# Patient Record
Sex: Male | Born: 1935 | Hispanic: No | Marital: Married | Smoking: Former smoker
Health system: Southern US, Community
[De-identification: ages and names within clinical notes are randomized; demographics above are authoritative.]

## PROBLEM LIST (undated history)

## (undated) DIAGNOSIS — N186 End stage renal disease: Secondary | ICD-10-CM

## (undated) DIAGNOSIS — N289 Disorder of kidney and ureter, unspecified: Secondary | ICD-10-CM

## (undated) DIAGNOSIS — Z992 Dependence on renal dialysis: Secondary | ICD-10-CM

## (undated) DIAGNOSIS — C801 Malignant (primary) neoplasm, unspecified: Secondary | ICD-10-CM

## (undated) DIAGNOSIS — I10 Essential (primary) hypertension: Secondary | ICD-10-CM

## (undated) HISTORY — PX: CHOLECYSTECTOMY: SHX55

## (undated) HISTORY — PX: EYE SURGERY: SHX253

---

## 2009-06-12 ENCOUNTER — Encounter: Admission: RE | Admit: 2009-06-12 | Discharge: 2009-06-12 | Payer: Self-pay | Admitting: Family Medicine

## 2009-06-12 ENCOUNTER — Encounter: Payer: Self-pay | Admitting: Gastroenterology

## 2009-06-13 ENCOUNTER — Encounter: Admission: RE | Admit: 2009-06-13 | Discharge: 2009-06-13 | Payer: Self-pay | Admitting: Family Medicine

## 2009-06-18 DIAGNOSIS — E781 Pure hyperglyceridemia: Secondary | ICD-10-CM | POA: Insufficient documentation

## 2009-06-18 DIAGNOSIS — I10 Essential (primary) hypertension: Secondary | ICD-10-CM | POA: Insufficient documentation

## 2009-06-19 ENCOUNTER — Ambulatory Visit: Payer: Self-pay | Admitting: Gastroenterology

## 2009-06-19 DIAGNOSIS — R634 Abnormal weight loss: Secondary | ICD-10-CM | POA: Insufficient documentation

## 2009-06-19 DIAGNOSIS — R1084 Generalized abdominal pain: Secondary | ICD-10-CM | POA: Insufficient documentation

## 2009-06-19 DIAGNOSIS — N179 Acute kidney failure, unspecified: Secondary | ICD-10-CM | POA: Insufficient documentation

## 2009-06-20 LAB — CONVERTED CEMR LAB
Albumin: 4 g/dL (ref 3.5–5.2)
Basophils Absolute: 0 10*3/uL (ref 0.0–0.1)
Bilirubin, Direct: 0.2 mg/dL (ref 0.0–0.3)
Chloride: 109 meq/L (ref 96–112)
Eosinophils Absolute: 0.1 10*3/uL (ref 0.0–0.7)
Eosinophils Relative: 2.1 % (ref 0.0–5.0)
Glucose, Bld: 92 mg/dL (ref 70–99)
HCT: 39.4 % (ref 39.0–52.0)
Iron: 95 ug/dL (ref 42–165)
Lipase: 63 units/L — ABNORMAL HIGH (ref 11.0–59.0)
Lymphs Abs: 2.3 10*3/uL (ref 0.7–4.0)
Monocytes Absolute: 0.4 10*3/uL (ref 0.1–1.0)
Neutro Abs: 3.5 10*3/uL (ref 1.4–7.7)
Platelets: 196 10*3/uL (ref 150.0–400.0)
RDW: 12.9 % (ref 11.5–14.6)
Saturation Ratios: 24.2 % (ref 20.0–50.0)
Sodium: 144 meq/L (ref 135–145)
Total Bilirubin: 1 mg/dL (ref 0.3–1.2)
Transferrin: 280.9 mg/dL (ref 212.0–360.0)
WBC: 6.3 10*3/uL (ref 4.5–10.5)

## 2009-06-21 ENCOUNTER — Ambulatory Visit (HOSPITAL_COMMUNITY): Admission: RE | Admit: 2009-06-21 | Discharge: 2009-06-21 | Payer: Self-pay | Admitting: Gastroenterology

## 2009-06-21 ENCOUNTER — Telehealth: Payer: Self-pay | Admitting: Gastroenterology

## 2009-06-21 DIAGNOSIS — K8070 Calculus of gallbladder and bile duct without cholecystitis without obstruction: Secondary | ICD-10-CM | POA: Insufficient documentation

## 2009-06-27 ENCOUNTER — Ambulatory Visit: Payer: Self-pay | Admitting: Gastroenterology

## 2009-06-27 ENCOUNTER — Encounter: Payer: Self-pay | Admitting: Gastroenterology

## 2009-06-27 DIAGNOSIS — K2970 Gastritis, unspecified, without bleeding: Secondary | ICD-10-CM | POA: Insufficient documentation

## 2009-06-27 DIAGNOSIS — K2990 Gastroduodenitis, unspecified, without bleeding: Secondary | ICD-10-CM

## 2009-06-27 LAB — CONVERTED CEMR LAB: UREASE: NEGATIVE

## 2009-07-02 ENCOUNTER — Encounter: Payer: Self-pay | Admitting: Gastroenterology

## 2009-07-19 ENCOUNTER — Encounter: Payer: Self-pay | Admitting: Gastroenterology

## 2009-07-19 ENCOUNTER — Ambulatory Visit: Payer: Self-pay | Admitting: Internal Medicine

## 2009-07-19 ENCOUNTER — Encounter (INDEPENDENT_AMBULATORY_CARE_PROVIDER_SITE_OTHER): Payer: Self-pay | Admitting: Surgery

## 2009-07-20 ENCOUNTER — Inpatient Hospital Stay (HOSPITAL_COMMUNITY): Admission: AD | Admit: 2009-07-20 | Discharge: 2009-07-22 | Payer: Self-pay | Admitting: Surgery

## 2009-07-20 ENCOUNTER — Encounter: Payer: Self-pay | Admitting: Internal Medicine

## 2010-01-18 ENCOUNTER — Inpatient Hospital Stay (HOSPITAL_COMMUNITY): Admission: EM | Admit: 2010-01-18 | Discharge: 2010-01-26 | Payer: Self-pay | Admitting: Internal Medicine

## 2010-01-18 DIAGNOSIS — D631 Anemia in chronic kidney disease: Secondary | ICD-10-CM | POA: Insufficient documentation

## 2010-01-18 DIAGNOSIS — K750 Abscess of liver: Secondary | ICD-10-CM | POA: Insufficient documentation

## 2010-01-18 DIAGNOSIS — Z87448 Personal history of other diseases of urinary system: Secondary | ICD-10-CM | POA: Insufficient documentation

## 2010-01-18 DIAGNOSIS — N184 Chronic kidney disease, stage 4 (severe): Secondary | ICD-10-CM

## 2010-01-19 ENCOUNTER — Ambulatory Visit: Payer: Self-pay | Admitting: Infectious Diseases

## 2010-01-26 IMAGING — CR DG ABDOMEN 1V
2 series · 2 of 2 positions shown · non-contrast
Comparison: 06/12/2009 and 07/20/2009

CLINICAL DATA: Follow-up ileus.  Abdominal distention.

ABDOMEN - 1 VIEW

[t abdomen supine (1 of 2)]
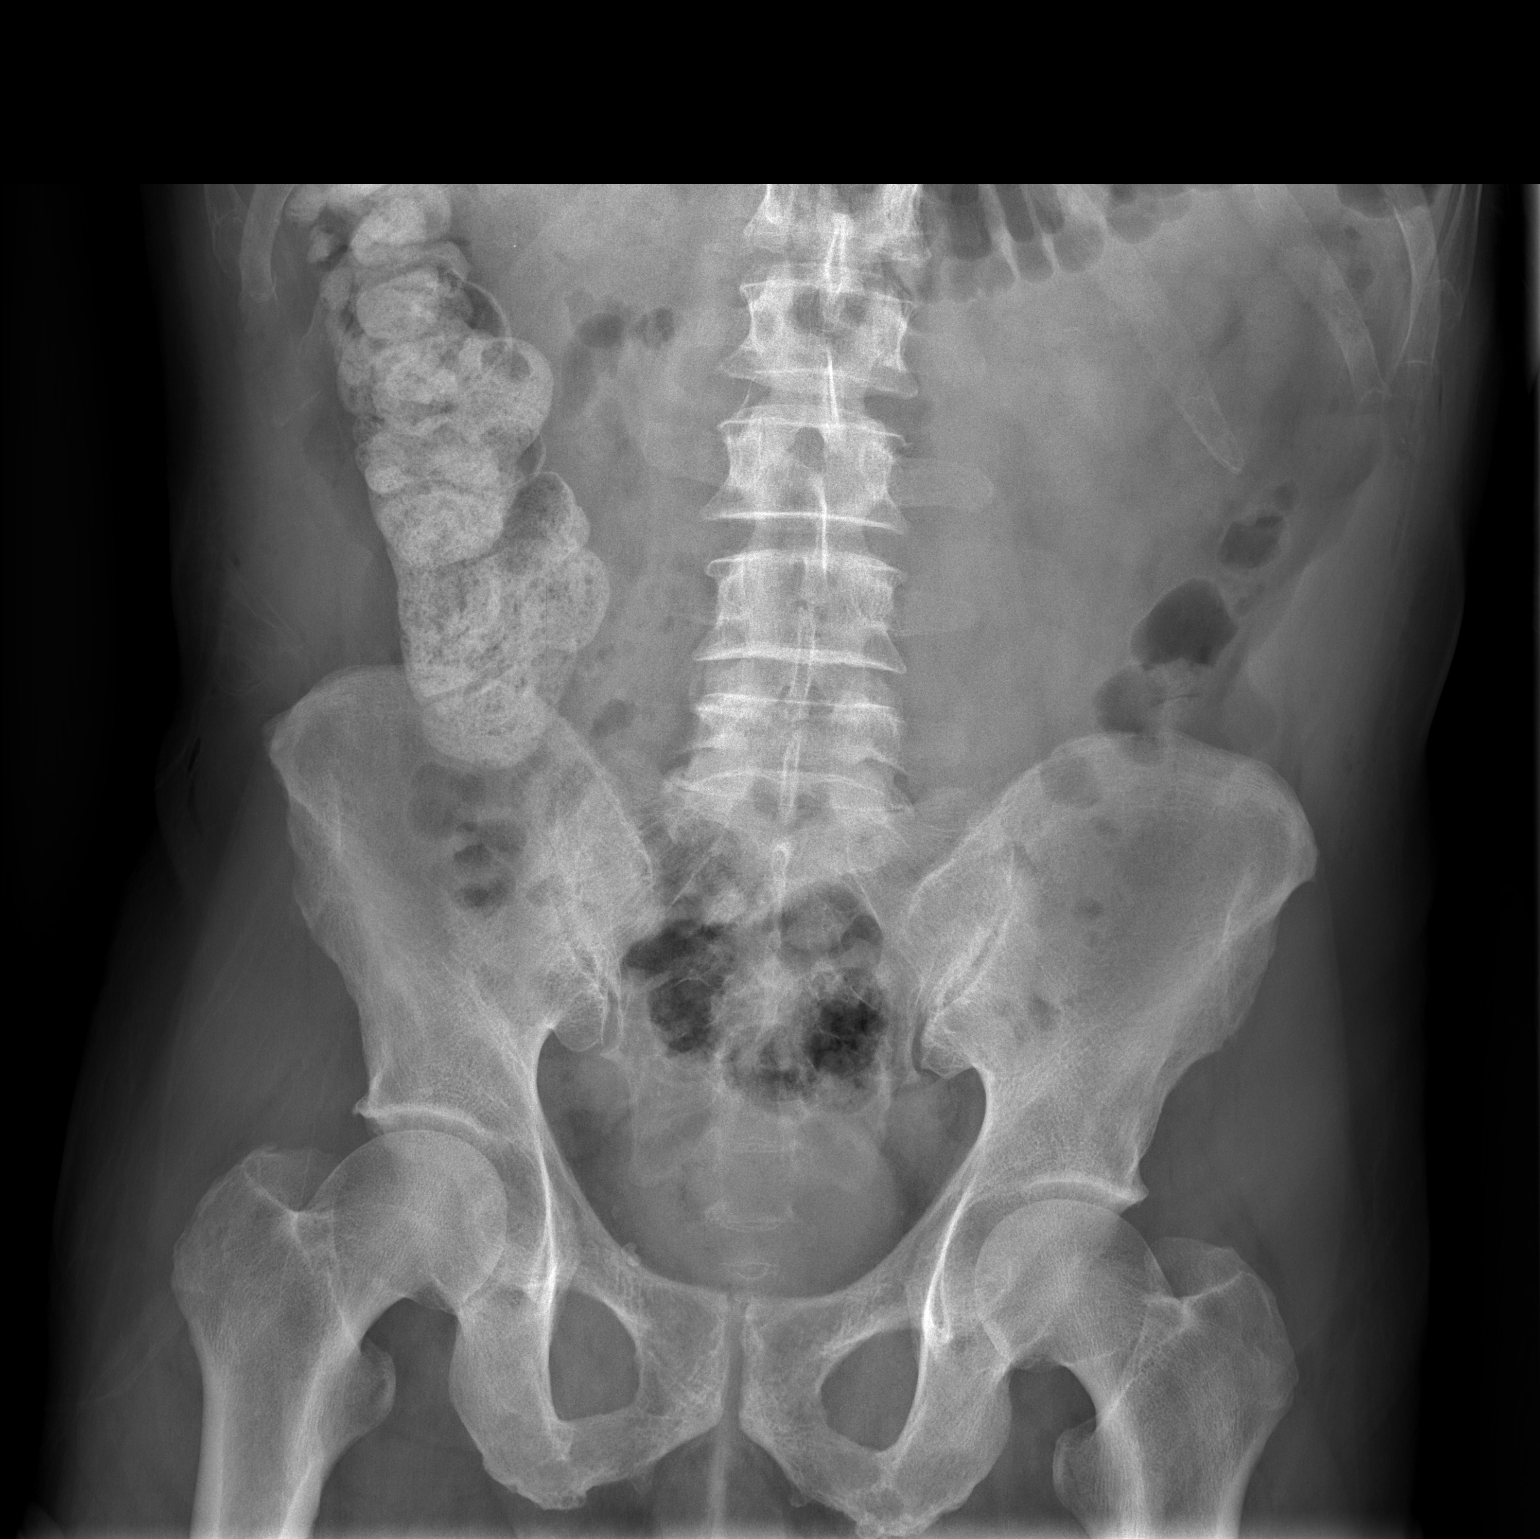

[t abdomen supine (2 of 2)]
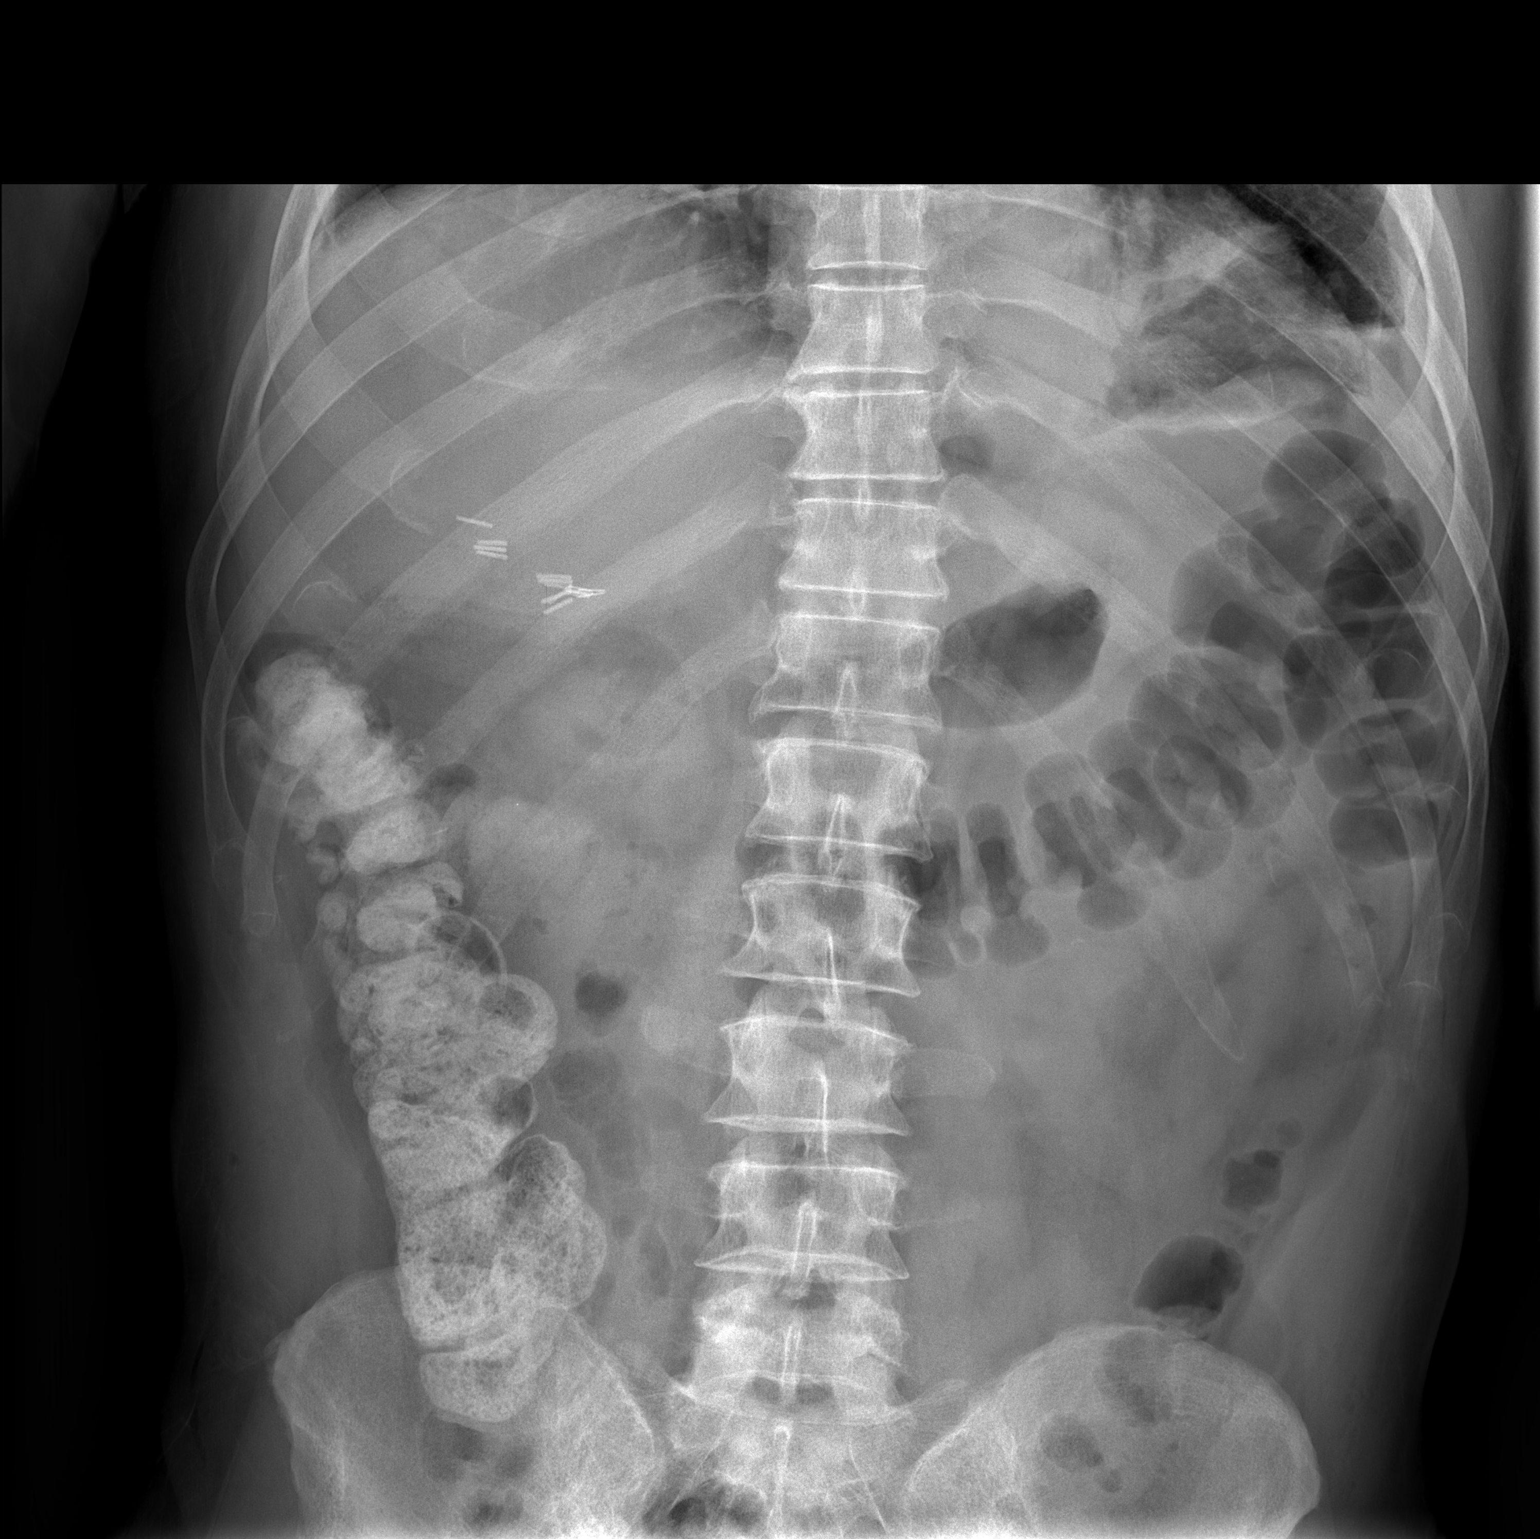

[2 of 2 positions shown; findings below may reference images not displayed]

FINDINGS: Contrast and stool in the right colon are noted.
Gas within the remainder of the colon, which is not distended, is
identified.
No dilated small bowel loops are present.
No acute bony abnormalities are noted.
Bibasilar atelectasis and very small left pleural effusion are
present.
IMPRESSION: Unremarkable bowel gas pattern.

Mild basilar atelectasis and very small left pleural effusion.

## 2010-01-28 ENCOUNTER — Encounter (INDEPENDENT_AMBULATORY_CARE_PROVIDER_SITE_OTHER): Payer: Self-pay | Admitting: *Deleted

## 2010-01-30 ENCOUNTER — Ambulatory Visit (HOSPITAL_COMMUNITY): Admission: RE | Admit: 2010-01-30 | Discharge: 2010-01-30 | Payer: Self-pay | Admitting: Internal Medicine

## 2010-02-05 ENCOUNTER — Encounter: Admission: RE | Admit: 2010-02-05 | Discharge: 2010-02-05 | Payer: Self-pay | Admitting: Interventional Radiology

## 2010-02-06 ENCOUNTER — Ambulatory Visit: Payer: Self-pay | Admitting: Internal Medicine

## 2010-02-08 ENCOUNTER — Encounter: Payer: Self-pay | Admitting: Internal Medicine

## 2010-02-18 ENCOUNTER — Ambulatory Visit: Payer: Self-pay | Admitting: Internal Medicine

## 2010-02-21 ENCOUNTER — Encounter: Admission: RE | Admit: 2010-02-21 | Discharge: 2010-02-21 | Payer: Self-pay | Admitting: Internal Medicine

## 2010-03-05 ENCOUNTER — Ambulatory Visit: Payer: Self-pay | Admitting: Internal Medicine

## 2010-03-05 DIAGNOSIS — L299 Pruritus, unspecified: Secondary | ICD-10-CM | POA: Insufficient documentation

## 2010-08-28 IMAGING — CT CT ABDOMEN W/O CM
3 of 4 series · 12 of 36 positions shown, 18 images · IV contrast (READICAT/WATER)
Comparison: 01/30/2010, 01/18/2010.

CLINICAL DATA: Abdominal pain.  Follow up of liver mass/abscess.
On antibiotics.  Feeling better.  Prior cholecystectomy and surgery
in 7527 for peritonitis.  Elevated creatinine.

CT ABDOMEN WITHOUT CONTRAST
TECHNIQUE: Multidetector CT imaging of the abdomen was performed
following the standard protocol with oral but without intravenous
contrast.

[Series 3: unenhanced · axial · 0.70mm/px · z∈[-193,+22]mm · 7 of 59 slices shown, 12 images]
[im 8/59  soft-tissue]
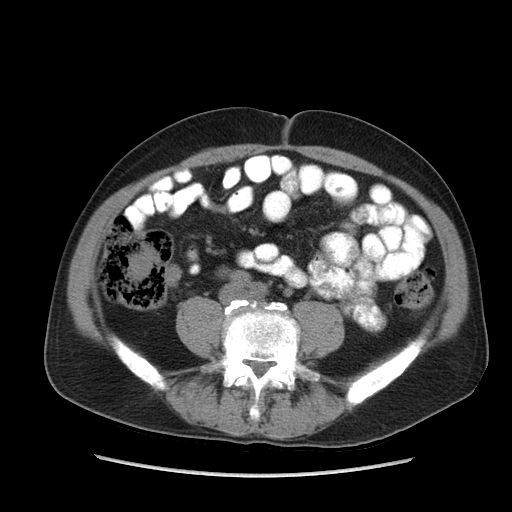
[im 8/59  bone]
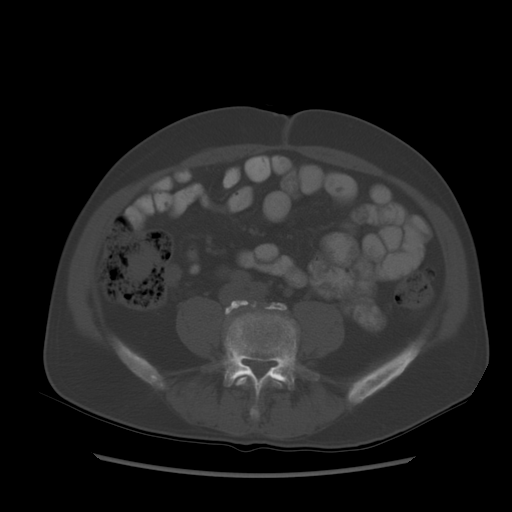
[im 15/59  soft-tissue]
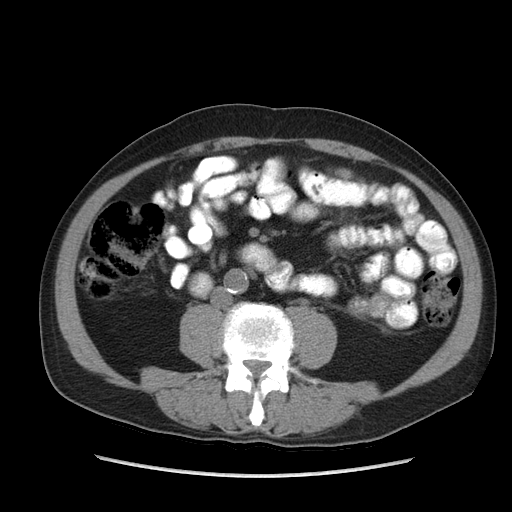
[im 22/59  soft-tissue]
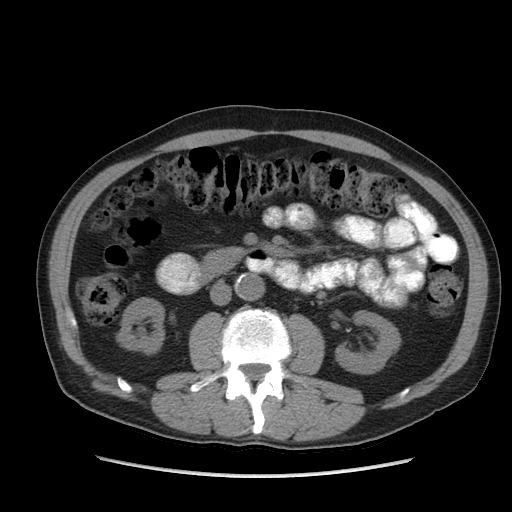
[im 30/59  soft-tissue]
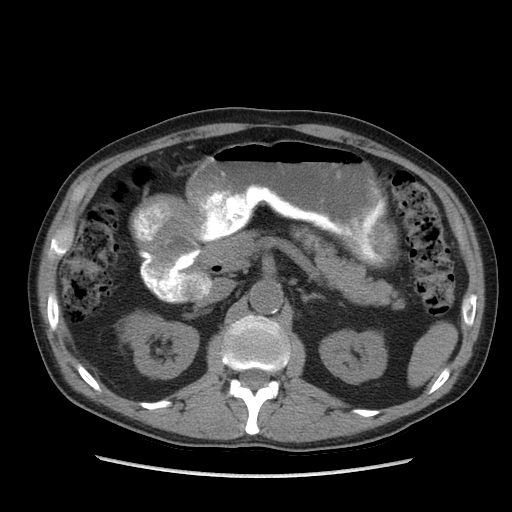
[im 30/59  lung]
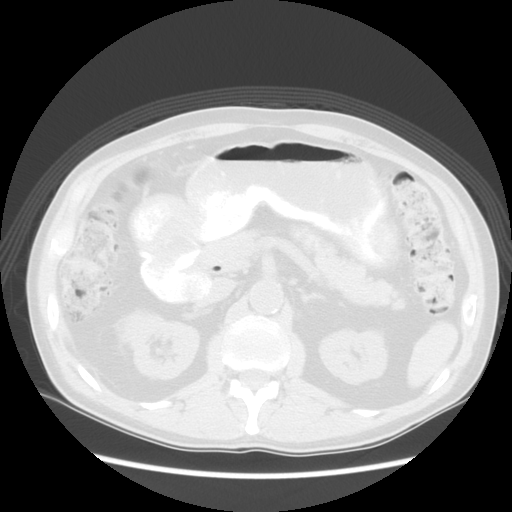
[im 37/59  soft-tissue]
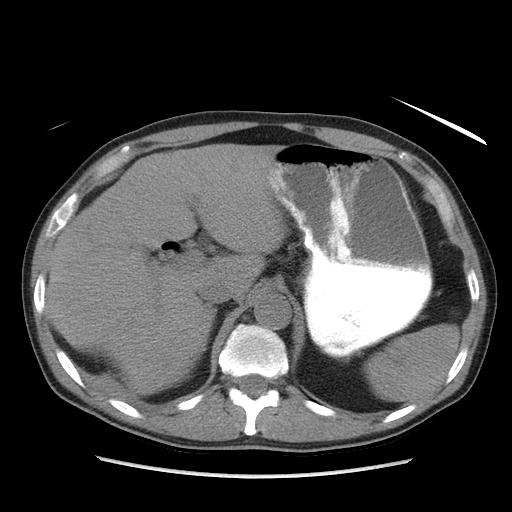
[im 37/59  lung]
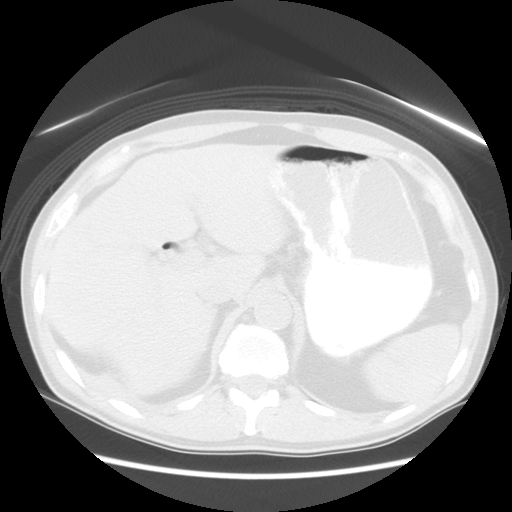
[im 44/59  soft-tissue]
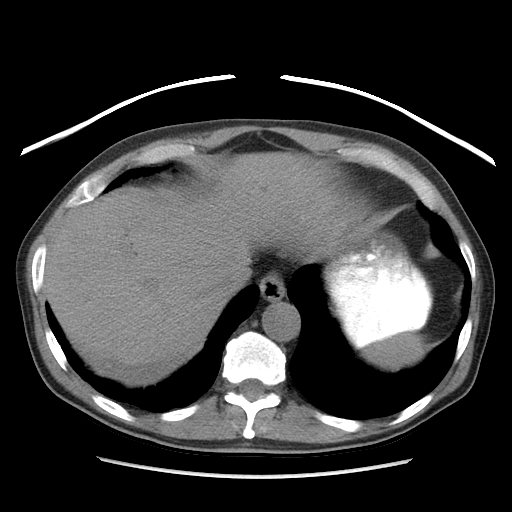
[im 44/59  lung]
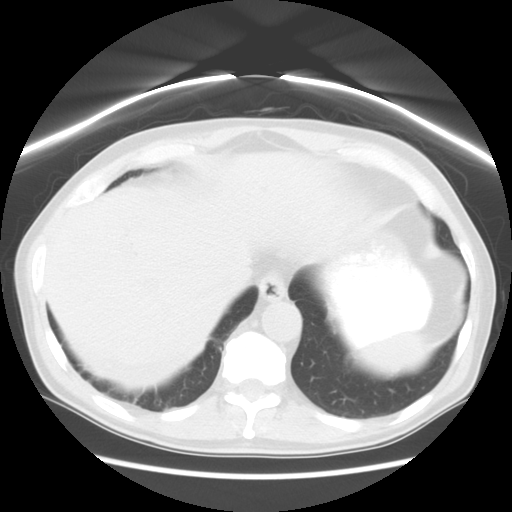
[im 51/59  soft-tissue]
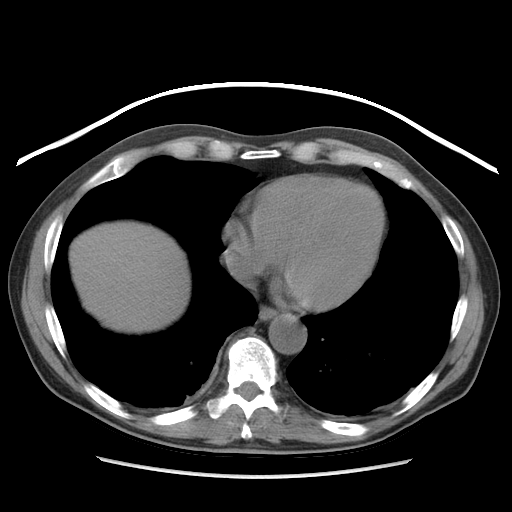
[im 51/59  lung]
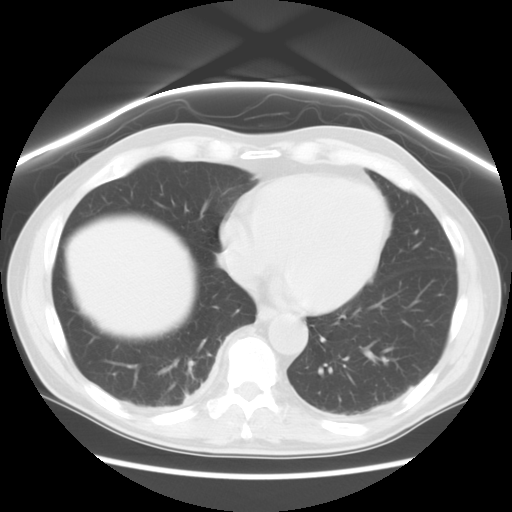

[Series 601: coronal body · coronal · 0.70mm/px · 1 of 102 slices shown, 2 images]
[im 34/102  soft-tissue]
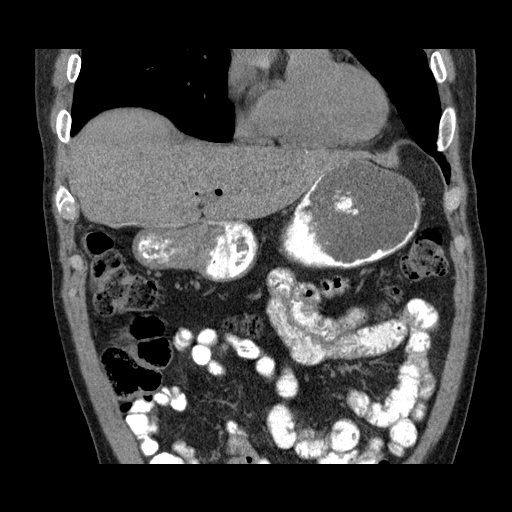
[im 34/102  bone]
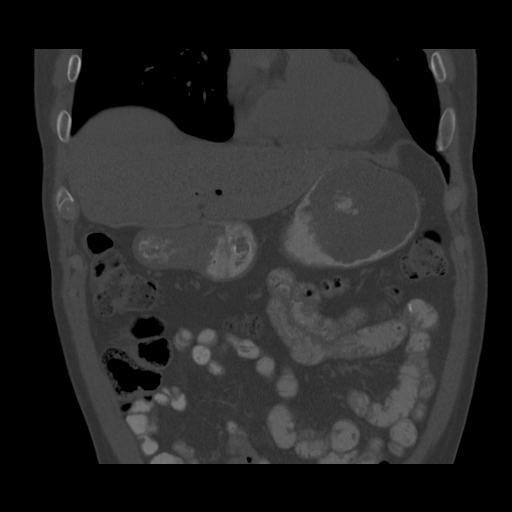

[Series 602: sagittal body · sagittal · 0.70mm/px · 4 of 143 slices shown]
[im 15/143  soft-tissue]
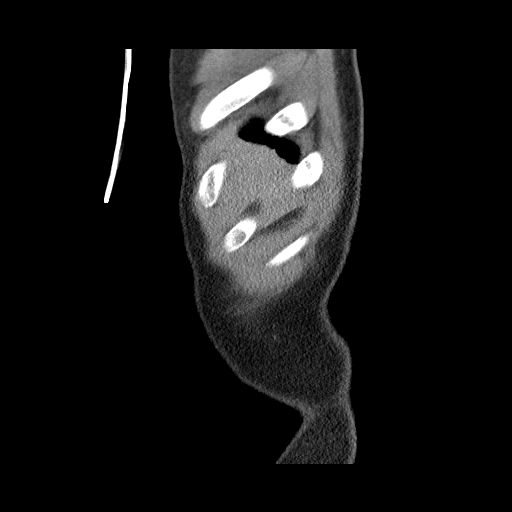
[im 30/143  soft-tissue]
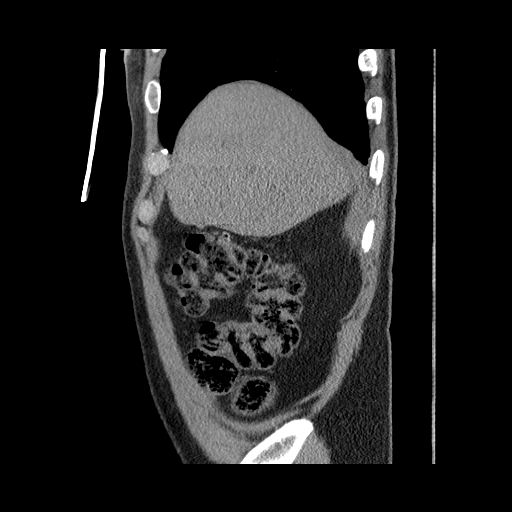
[im 45/143  soft-tissue]
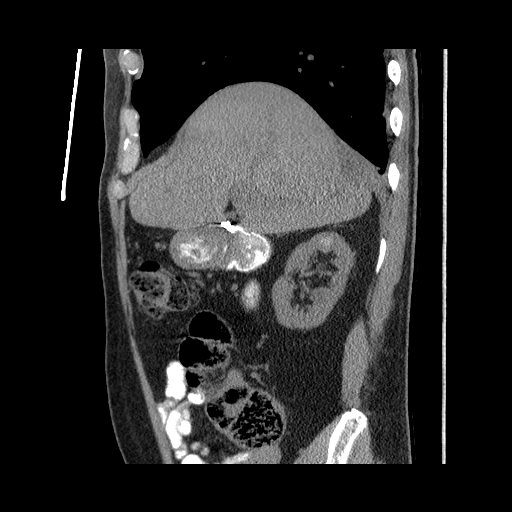
[im 60/143  soft-tissue]
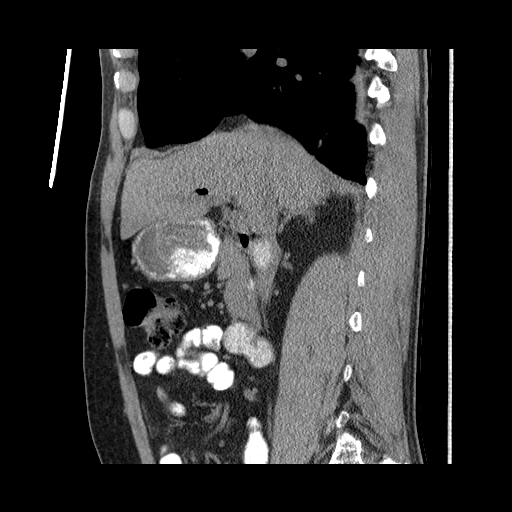

[12 of 36 positions shown; findings below may reference images not displayed]

FINDINGS: Mild bibasilar atelectasis.  Normal heart size.  Mild
bilateral pleural plaque formation.  Left-sided pleural
calcification is better imaged on prior chest CT.

Hypoattenuating hepatic dome lesion measures 6 mm on image 11
versus 7 mm on the prior exam.  Pneumobilia is again identified.
Decrease in size of a posterior right liver lobe hypoattenuating
lesion.  This measures 1.2 cm on image versus 2.8 cm on the prior
exam.  No definite new liver lesions, given limitations of
unenhanced CT.

  Normal spleen, stomach pancreas.  Cholecystectomy.  Normal
adrenal glands.  Mild renal cortical thinning bilaterally.  6 mm
hyperattenuating lesion in the upper pole right kidney on image 28.
Right renal cyst. No retroperitoneal or retrocrural adenopathy.

Normal colon and terminal ileum.  Normal abdominal small bowel
without ascites.

Interval removal of the pigtail catheter within the right
perihepatic space.  Area of surrounding soft tissue thickening has
decreased since the prior exam.  Approximately 1.3 x 3.3 cm on
image 25 versus 4.9 x 2.0 cm on the prior exam.  Soft tissue
density currently, without residual fluid identified .  The
extension towards the liver capsule is also decreased.

No acute osseous abnormality.
IMPRESSION: 1.  Further decrease in size of a a posterior right hepatic lobe
lesion and adjacent perihepatic soft tissue thickening.  No
evidence of residual drainable fluid  nor new infection.
2.  Hepatic dome lesion is indeterminate and measures similarly to
minimally decreased today.
3.  Probable right renal complex cyst.  Technically indeterminate
without contrast.
4. Thoracic findings which are suspicious for prior asbestos
exposure.  Clinically correlate.

## 2010-12-03 NOTE — Letter (Signed)
Summary: Discharge Summary  Discharge Summary   Imported By: Bonner Puna 02/08/2010 13:49:55  _____________________________________________________________________  External Attachment:    Type:   Image     Comment:   External Document

## 2010-12-03 NOTE — Miscellaneous (Signed)
Summary: Problems and Medications updated  Clinical Lists Changes  Problems: Added new problem of ABSCESS, LIVER (ICD-572.0) Added new problem of HEMATURIA, HX OF (ICD-V13.09) Added new problem of ANEMIA (ICD-285.9) Medications: Removed medication of ACIPHEX 20 MG  TBEC (RABEPRAZOLE SODIUM) Take 1 each day 30 minutes before meals Added new medication of MIRTAZAPINE 15 MG TABS (MIRTAZAPINE) Take 1 tablet by mouth at bedtime Added new medication of INVANZ 1 GM SOLR (ERTAPENEM SODIUM) Administer IV via PiCC every 24 hours for the next 4 weeks

## 2010-12-03 NOTE — Assessment & Plan Note (Signed)
Summary: 2wk f/u/vs   Referring Provider:  De Nurse, MD Primary Provider:  De Nurse, MD  CC:  follow-up visit.  History of Present Illness: Mr. Dacko has now completed approximately 4 weeks of IV antibiotics following the drainage of his liver abscess.  His drain was pulled about 3 weeks ago.  He is feeling much better.  He has had no more fevers or abdominal pain.  His appetite has returned to normal and he has regained most of his weight.  He has had no problems tolerating his IV Invanz or PICC.  Preventive Screening-Counseling & Management  Alcohol-Tobacco     Alcohol drinks/day: 0     Smoking Status: quit  Caffeine-Diet-Exercise     Caffeine use/day: yes     Does Patient Exercise: no  Safety-Violence-Falls     Seat Belt Use: yes   Prior Medication List:  LISINOPRIL-HYDROCHLOROTHIAZIDE 20-12.5 MG TABS (LISINOPRIL-HYDROCHLOROTHIAZIDE) take one tab by mouth once daily MIRTAZAPINE 15 MG TABS (MIRTAZAPINE) Take 1 tablet by mouth at bedtime INVANZ 1 GM SOLR (ERTAPENEM SODIUM) Administer IV via PiCC every 24 hours for the next 4 weeks   Current Allergies (reviewed today): ! LOVASTATIN ! LIPITOR (ATORVASTATIN) Vital Signs:  Patient profile:   77 year old male Height:      65 inches (165.10 cm) Weight:      140.75 pounds (63.98 kg) BMI:     23.51 Temp:     97.4 degrees F (36.33 degrees C) oral Pulse rate:   67 / minute BP sitting:   126 / 76  (left arm) Cuff size:   regular  Vitals Entered By: Lorne Skeens RN (February 18, 2010 3:05 PM) CC: follow-up visit Is Patient Diabetic? No Pain Assessment Patient in pain? no      Nutritional Status BMI of 19 -24 = normal Nutritional Status Detail appetit e"very good"  Have you ever been in a relationship where you felt threatened, hurt or afraid?not asked, wife present   Does patient need assistance? Functional Status Self care Ambulation Normal   Physical Exam  General:  alert and well-nourished.   Abdomen:   soft, non-tender, normal bowel sounds, and no distention.     Impression & Recommendations:  Problem # 1:  ABSCESS, LIVER (ICD-572.0) He is much better and there is a good chance that his liver abscess he is have resolved.  However on his last CAT scan he did have an extra hepatic abscess that had not changed in size.  I reviewed options with him and his daughters today and we have decided to change him to oral Augmentin, remove the PICC, and repeat the CAT scan before deciding to stop all antibiotics. Orders: Est. Patient Level III [id number]) CT without Contrast (CT w/o contrast)  Medications Added to Medication List This Visit: 1)  Augmentin 875-125 Mg Tabs (Amoxicillin-pot clavulanate) .... Take 1 tablet by mouth two times a day  Patient Instructions: 1)  Please schedule a follow-up appointment on May 3. Prescriptions: AUGMENTIN 875-125 MG TABS (AMOXICILLIN-POT CLAVULANATE) Take 1 tablet by mouth two times a day  #28 x 1   Entered and Authorized by:   Michel Bickers MD   Signed by:   Michel Bickers MD on 02/18/2010   Method used:   Electronically to        [facility] [phone]* (retail)       [address]       [place], [place]  [address]       Ph: [phone]  or [phone]       Fax: [phone]   [id number]   Appended Document: 2wk f/u/vs

## 2010-12-03 NOTE — Assessment & Plan Note (Signed)
Summary: F/U OV/VS   Referring Provider:  De Nurse, MD Primary Provider:  De Nurse, MD  CC:  follow-up visit and started itching right away after beginnning oral antibiotic.  History of Present Illness: Mr. Macak is in for his routine visit.  He is completing his sixth week of total antibiotic therapy for his liver abscess.  He has been on Augmentin for the last two weeks.  He is feeling much better.  He has not had any fever, abdominal pain, and his appetite has returned to normal.  He is continuing to regain his lost weight.  His only problem since his last visit has been generalized itching that started shortly after he began taking Augmentin.  Preventive Screening-Counseling & Management  Alcohol-Tobacco     Alcohol drinks/day: 0     Smoking Status: quit  Caffeine-Diet-Exercise     Caffeine use/day: yes     Does Patient Exercise: no  Safety-Violence-Falls     Seat Belt Use: yes   Current Allergies (reviewed today): ! LOVASTATIN ! LIPITOR (ATORVASTATIN) Vital Signs:  Patient profile:   77 year old male Height:      65 inches (165.10 cm) Weight:      141.5 pounds (64.32 kg) BMI:     23.63 Temp:     97.7 degrees F (36.50 degrees C) oral Pulse rate:   72 / minute BP sitting:   130 / 77  Vitals Entered By: Lorne Skeens RN (Mar 05, 2010 10:01 AM) CC: follow-up visit, started itching right away after beginnning oral antibiotic Is Patient Diabetic? No Pain Assessment Patient in pain? no      Nutritional Status BMI of 19 -24 = normal Nutritional Status Detail appetite "very good"  Have you ever been in a relationship where you felt threatened, hurt or afraid?not asked   Does patient need assistance? Functional Status Self care Ambulation Normal   Physical Exam  General:  alert and well-nourished.  his weight is up one more pound or Lungs:  normal breath sounds.  no crackles and no wheezes.   Heart:  normal rate, regular rhythm, and no murmur.     Abdomen:  soft, non-tender, normal bowel sounds, and no masses.   Skin:  no rashes.     Impression & Recommendations:  Problem # 1:  ABSCESS, LIVER (ICD-572.0) His CAT scan two weeks ago showed significant improvement in his liver abscess. I suspect he is cured and will stop his antibiotics today, especially since he is probably having generalized itching due to the Augmentin.  I have asked him to call me if he begins to have any more fever, abdominal pain or other signs or symptoms of recurrent infection. Orders: Est. Patient Level III [id number])  Problem # 2:  PRURITUS (ICD-698.9) I suspect he has a mild allergic reaction to Augmentin and it should resolve once he has stopped it. Orders: Est. Patient Level III [id number])  Patient Instructions: 1)  Please schedule a follow-up appointment in 6 weeks.  Appended Document: F/U OV, updated allergies

## 2010-12-03 NOTE — Assessment & Plan Note (Signed)
Summary: HFU   Referring Provider:  De Nurse, MD Primary Provider:  De Nurse, MD  CC:  [facility].  History of Present Illness: Mr. Pedro Young is in for his hospital follow-up visit.  He recently developed a liver abscess and was hospitalized with fever.  On March 21 he underwent percutaneous drainage of the abscess.  E. coli grew from cultures and he improved on empiric antibiotics.  He was discharged home recently on Invanz.  Follow-up CT scan on March 30 showed improvement.  His percutaneous catheter drainage decreased dramatically and the catheter was removed yesterday.  He lost about 10 pounds during this illness but his appetite is improving and he is feeling much better.  He's not had any problems tolerating his PICC or Invanz.  Preventive Screening-Counseling & Management  Alcohol-Tobacco     Alcohol drinks/day: 0     Smoking Status: quit  Caffeine-Diet-Exercise     Caffeine use/day: yes     Does Patient Exercise: no  Safety-Violence-Falls     Seat Belt Use: yes   Current Allergies (reviewed today): ! LOVASTATIN ! LIPITOR (ATORVASTATIN) Vital Signs:  Patient profile:   77 year old male Height:      65 inches (165.10 cm) Weight:      136.5 pounds (62.05 kg) BMI:     22.80 Temp:     97.5 degrees F (36.39 degrees C) oral Pulse rate:   99 / minute BP sitting:   102 / 67  Vitals Entered By: Lorne Skeens RN (February 06, 2010 2:11 PM) CC: [facility] Is Patient Diabetic? No Pain Assessment Patient in pain? no      Nutritional Status BMI of 19 -24 = normal Nutritional Status Detail appetite :very good"  Have you ever been in a relationship where you felt threatened, hurt or afraid?not asked   Does patient need assistance? Functional Status Self care Ambulation Normal   Physical Exam  General:  alert and well-nourished.   Lungs:  normal breath sounds.   Heart:  normal rate, regular rhythm, and no murmur.   Abdomen:  soft, non-tender, and normal bowel sounds.   there is a clean dry dressing on his right flank where the drain was removed. Extremities:  his right arm PICC site appears normal.   Impression & Recommendations:  Problem # 1:  ABSCESS, LIVER (ICD-572.0) He is improving.  I will see him back on April 18 and consider switching to oral Augmentin and getting a follow-up CT scan at that time. Orders: Est. Patient Level III [id number])  Patient Instructions: 1)  Please schedule a follow-up appointment on 4/18.

## 2011-01-27 LAB — CULTURE, BLOOD (ROUTINE X 2)

## 2011-01-27 LAB — HIV-1 RNA QUANT-NO REFLEX-BLD
HIV 1 RNA Quant: <48 copies/mL (ref <48)
HIV-1 RNA Quant, Log: <1.68 {Log} (ref <1.68)

## 2011-01-27 LAB — GLUCOSE, CAPILLARY
Glucose-Capillary: 102 mg/dL — ABNORMAL HIGH (ref 70–99)
Glucose-Capillary: 106 mg/dL — ABNORMAL HIGH (ref 70–99)
Glucose-Capillary: 118 mg/dL — ABNORMAL HIGH (ref 70–99)
Glucose-Capillary: 118 mg/dL — ABNORMAL HIGH (ref 70–99)
Glucose-Capillary: 124 mg/dL — ABNORMAL HIGH (ref 70–99)
Glucose-Capillary: 195 mg/dL — ABNORMAL HIGH (ref 70–99)
Glucose-Capillary: 89 mg/dL (ref 70–99)
Glucose-Capillary: 98 mg/dL (ref 70–99)

## 2011-01-27 LAB — HEPATITIS E ANTIBODY, IGG

## 2011-01-27 LAB — URINE CULTURE: Colony Count: 60000

## 2011-01-27 LAB — COMPREHENSIVE METABOLIC PANEL
ALT: 24 U/L (ref 0–53)
ALT: 26 U/L (ref 0–53)
AST: 19 U/L (ref 0–37)
AST: 23 U/L (ref 0–37)
Albumin: 2.3 g/dL — ABNORMAL LOW (ref 3.5–5.2)
Albumin: 2.4 g/dL — ABNORMAL LOW (ref 3.5–5.2)
Albumin: 2.5 g/dL — ABNORMAL LOW (ref 3.5–5.2)
BUN: 59 mg/dL — ABNORMAL HIGH (ref 6–23)
CO2: 24 mEq/L (ref 19–32)
Chloride: 100 mEq/L (ref 96–112)
Chloride: 102 mEq/L (ref 96–112)
Chloride: 104 mEq/L (ref 96–112)
Creatinine, Ser: 2.7 mg/dL — ABNORMAL HIGH (ref 0.4–1.5)
Creatinine, Ser: 2.74 mg/dL — ABNORMAL HIGH (ref 0.4–1.5)
GFR calc Af Amer: 25 mL/min — ABNORMAL LOW (ref >60)
GFR calc Af Amer: 28 mL/min — ABNORMAL LOW (ref >60)
GFR calc non Af Amer: 20 mL/min — ABNORMAL LOW (ref >60)
GFR calc non Af Amer: 23 mL/min — ABNORMAL LOW (ref >60)
Sodium: 136 mEq/L (ref 135–145)
Sodium: 138 mEq/L (ref 135–145)
Total Bilirubin: 0.5 mg/dL (ref 0.3–1.2)
Total Bilirubin: 0.8 mg/dL (ref 0.3–1.2)
Total Bilirubin: 0.9 mg/dL (ref 0.3–1.2)

## 2011-01-27 LAB — CBC
HCT: 26.7 % — ABNORMAL LOW (ref 39.0–52.0)
HCT: 27.2 % — ABNORMAL LOW (ref 39.0–52.0)
HCT: 27.6 % — ABNORMAL LOW (ref 39.0–52.0)
HCT: 28.6 % — ABNORMAL LOW (ref 39.0–52.0)
Hemoglobin: 9 g/dL — ABNORMAL LOW (ref 13.0–17.0)
Hemoglobin: 9.2 g/dL — ABNORMAL LOW (ref 13.0–17.0)
Hemoglobin: 9.2 g/dL — ABNORMAL LOW (ref 13.0–17.0)
MCHC: 33.8 g/dL (ref 30.0–36.0)
MCHC: 33.9 g/dL (ref 30.0–36.0)
MCHC: 34.1 g/dL (ref 30.0–36.0)
MCV: 87.7 fL (ref 78.0–100.0)
MCV: 87.8 fL (ref 78.0–100.0)
MCV: 88.2 fL (ref 78.0–100.0)
Platelets: 234 10*3/uL (ref 150–400)
Platelets: 238 10*3/uL (ref 150–400)
Platelets: 263 10*3/uL (ref 150–400)
Platelets: 318 10*3/uL (ref 150–400)
RBC: 3.04 MIL/uL — ABNORMAL LOW (ref 4.22–5.81)
RBC: 3.08 MIL/uL — ABNORMAL LOW (ref 4.22–5.81)
RBC: 3.23 MIL/uL — ABNORMAL LOW (ref 4.22–5.81)
RDW: 15.3 % (ref 11.5–15.5)
RDW: 15.4 % (ref 11.5–15.5)
RDW: 15.4 % (ref 11.5–15.5)
WBC: 13.5 10*3/uL — ABNORMAL HIGH (ref 4.0–10.5)
WBC: 15.8 10*3/uL — ABNORMAL HIGH (ref 4.0–10.5)
WBC: 17.6 10*3/uL — ABNORMAL HIGH (ref 4.0–10.5)
WBC: 25.6 10*3/uL — ABNORMAL HIGH (ref 4.0–10.5)

## 2011-01-27 LAB — E. HISTOLYTICA ANTIBODY (AMOEBA AB)

## 2011-01-27 LAB — BASIC METABOLIC PANEL
BUN: 42 mg/dL — ABNORMAL HIGH (ref 6–23)
CO2: 23 mEq/L (ref 19–32)
CO2: 25 mEq/L (ref 19–32)
Calcium: 8.2 mg/dL — ABNORMAL LOW (ref 8.4–10.5)
Calcium: 8.6 mg/dL (ref 8.4–10.5)
Chloride: 106 mEq/L (ref 96–112)
Chloride: 106 mEq/L (ref 96–112)
Creatinine, Ser: 3.27 mg/dL — ABNORMAL HIGH (ref 0.4–1.5)
GFR calc Af Amer: 23 mL/min — ABNORMAL LOW (ref >60)
GFR calc Af Amer: 25 mL/min — ABNORMAL LOW (ref >60)
GFR calc non Af Amer: 22 mL/min — ABNORMAL LOW (ref >60)
Glucose, Bld: 106 mg/dL — ABNORMAL HIGH (ref 70–99)
Glucose, Bld: 162 mg/dL — ABNORMAL HIGH (ref 70–99)
Potassium: 4 mEq/L (ref 3.5–5.1)
Potassium: 4.4 mEq/L (ref 3.5–5.1)
Potassium: 4.5 mEq/L (ref 3.5–5.1)
Sodium: 140 mEq/L (ref 135–145)
Sodium: 141 mEq/L (ref 135–145)
Sodium: 142 mEq/L (ref 135–145)

## 2011-01-27 LAB — EPSTEIN-BARR VIRUS EARLY D ANTIGEN ANTIBODY, IGG: EBV EA IgG: 1.71 {ISR} — ABNORMAL HIGH

## 2011-01-27 LAB — DIC (DISSEMINATED INTRAVASCULAR COAGULATION)PANEL
D-Dimer, Quant: 1.09 ug/mL-FEU — ABNORMAL HIGH (ref 0.00–0.48)
INR: 1.15 (ref 0.00–1.49)
Prothrombin Time: 14.6 seconds (ref 11.6–15.2)
Smear Review: NONE SEEN
aPTT: 36 seconds (ref 24–37)

## 2011-01-27 LAB — DIFFERENTIAL
Basophils Absolute: 0 10*3/uL (ref 0.0–0.1)
Basophils Relative: 0 % (ref 0–1)
Eosinophils Relative: 0 % (ref 0–5)
Lymphocytes Relative: 16 % (ref 12–46)
Lymphocytes Relative: 18 % (ref 12–46)
Monocytes Absolute: 1 10*3/uL (ref 0.1–1.0)
Monocytes Absolute: 1.4 10*3/uL — ABNORMAL HIGH (ref 0.1–1.0)
Monocytes Relative: 8 % (ref 3–12)
Neutro Abs: 10.2 10*3/uL — ABNORMAL HIGH (ref 1.7–7.7)
Neutro Abs: 9.6 10*3/uL — ABNORMAL HIGH (ref 1.7–7.7)
Neutrophils Relative %: 71 % (ref 43–77)

## 2011-01-27 LAB — PROTEIN ELECTROPHORESIS, SERUM
Alpha-1-Globulin: 8.9 % — ABNORMAL HIGH (ref 2.9–4.9)
Alpha-2-Globulin: 17.1 % — ABNORMAL HIGH (ref 7.1–11.8)
Beta 2: 6.3 % (ref 3.2–6.5)
Beta Globulin: 6.8 % (ref 4.7–7.2)
Gamma Globulin: 19.1 % — ABNORMAL HIGH (ref 11.1–18.8)

## 2011-01-27 LAB — AFP TUMOR MARKER: AFP-Tumor Marker: 1.4 ng/mL (ref 0.0–8.0)

## 2011-01-27 LAB — URINALYSIS, ROUTINE W REFLEX MICROSCOPIC
Leukocytes, UA: NEGATIVE
Protein, ur: NEGATIVE mg/dL
Urobilinogen, UA: 1 mg/dL (ref 0.0–1.0)

## 2011-01-27 LAB — CULTURE, ROUTINE-ABSCESS

## 2011-01-27 LAB — MISCELLANEOUS TEST

## 2011-01-27 LAB — PROTIME-INR
INR: 1.17 (ref 0.00–1.49)
INR: 1.31 (ref 0.00–1.49)
Prothrombin Time: 14.8 seconds (ref 11.6–15.2)

## 2011-01-27 LAB — HEMOGLOBIN A1C: Mean Plasma Glucose: 123 mg/dL

## 2011-01-27 LAB — ANA: Anti Nuclear Antibody(ANA): NEGATIVE

## 2011-01-27 LAB — CK: Total CK: 27 U/L (ref 7–232)

## 2011-01-27 LAB — FUNGUS CULTURE W SMEAR

## 2011-01-27 LAB — URINE MICROSCOPIC-ADD ON

## 2011-01-27 LAB — ANAEROBIC CULTURE

## 2011-01-27 LAB — HCV RNA QUANT

## 2011-01-27 LAB — CRYOGLOBULIN

## 2011-02-07 LAB — DIFFERENTIAL
Eosinophils Absolute: 0.1 10*3/uL (ref 0.0–0.7)
Lymphocytes Relative: 34 % (ref 12–46)
Lymphs Abs: 2.2 10*3/uL (ref 0.7–4.0)
Monocytes Relative: 8 % (ref 3–12)
Neutrophils Relative %: 56 % (ref 43–77)

## 2011-02-07 LAB — HEPATIC FUNCTION PANEL
AST: 23 U/L (ref 0–37)
AST: 34 U/L (ref 0–37)
Albumin: 2.7 g/dL — ABNORMAL LOW (ref 3.5–5.2)
Alkaline Phosphatase: 83 U/L (ref 39–117)
Bilirubin, Direct: 0.2 mg/dL (ref 0.0–0.3)
Indirect Bilirubin: 1.2 mg/dL — ABNORMAL HIGH (ref 0.3–0.9)
Total Bilirubin: 1 mg/dL (ref 0.3–1.2)

## 2011-02-07 LAB — COMPREHENSIVE METABOLIC PANEL
ALT: 44 U/L (ref 0–53)
ALT: 12 U/L (ref 0–53)
BUN: 48 mg/dL — ABNORMAL HIGH (ref 6–23)
CO2: 23 mEq/L (ref 19–32)
Calcium: 8.1 mg/dL — ABNORMAL LOW (ref 8.4–10.5)
Calcium: 9 mg/dL (ref 8.4–10.5)
Creatinine, Ser: 2.29 mg/dL — ABNORMAL HIGH (ref 0.4–1.5)
GFR calc Af Amer: 34 mL/min — ABNORMAL LOW (ref >60)
GFR calc non Af Amer: 28 mL/min — ABNORMAL LOW (ref >60)
Glucose, Bld: 128 mg/dL — ABNORMAL HIGH (ref 70–99)
Glucose, Bld: 95 mg/dL (ref 70–99)
Sodium: 137 mEq/L (ref 135–145)
Sodium: 137 mEq/L (ref 135–145)
Total Protein: 7.1 g/dL (ref 6.0–8.3)

## 2011-02-07 LAB — AMYLASE: Amylase: 85 U/L (ref 27–131)

## 2011-02-07 LAB — ABO/RH: ABO/RH(D): A POS

## 2011-02-07 LAB — CBC
HCT: 36.9 % — ABNORMAL LOW (ref 39.0–52.0)
HCT: 39 % (ref 39.0–52.0)
Hemoglobin: 12.1 g/dL — ABNORMAL LOW (ref 13.0–17.0)
Hemoglobin: 13.2 g/dL (ref 13.0–17.0)
Hemoglobin: 12.9 g/dL — ABNORMAL LOW (ref 13.0–17.0)
MCHC: 33.8 g/dL (ref 30.0–36.0)
MCHC: 33.5 g/dL (ref 30.0–36.0)
MCV: 94.2 fL (ref 78.0–100.0)
MCV: 94.9 fL (ref 78.0–100.0)
MCV: 94.5 fL (ref 78.0–100.0)
Platelets: 124 10*3/uL — ABNORMAL LOW (ref 150–400)
RBC: 3.76 MIL/uL — ABNORMAL LOW (ref 4.22–5.81)
RBC: 4.11 MIL/uL — ABNORMAL LOW (ref 4.22–5.81)
RDW: 13.9 % (ref 11.5–15.5)
RDW: 14 % (ref 11.5–15.5)

## 2011-02-07 LAB — PROTIME-INR: INR: 1 (ref 0.00–1.49)

## 2011-02-07 LAB — TYPE AND SCREEN: ABO/RH(D): A POS

## 2011-07-18 ENCOUNTER — Ambulatory Visit
Admission: RE | Admit: 2011-07-18 | Discharge: 2011-07-18 | Disposition: A | Discharge status: Home / Self Care | Payer: Medicare PPO | Source: Ambulatory Visit | Attending: Specialist | Admitting: Specialist

## 2011-07-18 ENCOUNTER — Other Ambulatory Visit: Payer: Self-pay | Admitting: Specialist

## 2011-07-18 DIAGNOSIS — N342 Other urethritis: Secondary | ICD-10-CM

## 2011-07-18 DIAGNOSIS — H209 Unspecified iridocyclitis: Secondary | ICD-10-CM

## 2013-05-16 DIAGNOSIS — E785 Hyperlipidemia, unspecified: Secondary | ICD-10-CM | POA: Diagnosis present

## 2015-08-02 ENCOUNTER — Emergency Department (HOSPITAL_COMMUNITY): Payer: Medicare HMO

## 2015-08-02 ENCOUNTER — Inpatient Hospital Stay (HOSPITAL_COMMUNITY)
Admission: EM | Admit: 2015-08-02 | Discharge: 2015-08-09 | DRG: 438 | Disposition: A | Discharge status: Home / Self Care | Payer: Medicare HMO | Attending: Internal Medicine | Admitting: Internal Medicine

## 2015-08-02 ENCOUNTER — Encounter (HOSPITAL_COMMUNITY): Payer: Self-pay | Admitting: Emergency Medicine

## 2015-08-02 DIAGNOSIS — M10071 Idiopathic gout, right ankle and foot: Secondary | ICD-10-CM | POA: Diagnosis not present

## 2015-08-02 DIAGNOSIS — K859 Acute pancreatitis without necrosis or infection, unspecified: Secondary | ICD-10-CM | POA: Diagnosis present

## 2015-08-02 DIAGNOSIS — R319 Hematuria, unspecified: Secondary | ICD-10-CM | POA: Diagnosis present

## 2015-08-02 DIAGNOSIS — K8500 Idiopathic acute pancreatitis without necrosis or infection: Secondary | ICD-10-CM | POA: Diagnosis not present

## 2015-08-02 DIAGNOSIS — N184 Chronic kidney disease, stage 4 (severe): Secondary | ICD-10-CM | POA: Diagnosis present

## 2015-08-02 DIAGNOSIS — M109 Gout, unspecified: Secondary | ICD-10-CM

## 2015-08-02 DIAGNOSIS — K5900 Constipation, unspecified: Secondary | ICD-10-CM | POA: Diagnosis not present

## 2015-08-02 DIAGNOSIS — Z66 Do not resuscitate: Secondary | ICD-10-CM | POA: Diagnosis present

## 2015-08-02 DIAGNOSIS — N179 Acute kidney failure, unspecified: Secondary | ICD-10-CM | POA: Diagnosis not present

## 2015-08-02 DIAGNOSIS — R1030 Lower abdominal pain, unspecified: Secondary | ICD-10-CM | POA: Diagnosis present

## 2015-08-02 DIAGNOSIS — G4700 Insomnia, unspecified: Secondary | ICD-10-CM | POA: Diagnosis present

## 2015-08-02 DIAGNOSIS — I10 Essential (primary) hypertension: Secondary | ICD-10-CM | POA: Diagnosis not present

## 2015-08-02 DIAGNOSIS — I129 Hypertensive chronic kidney disease with stage 1 through stage 4 chronic kidney disease, or unspecified chronic kidney disease: Secondary | ICD-10-CM | POA: Diagnosis present

## 2015-08-02 DIAGNOSIS — D631 Anemia in chronic kidney disease: Secondary | ICD-10-CM | POA: Diagnosis present

## 2015-08-02 DIAGNOSIS — N170 Acute kidney failure with tubular necrosis: Secondary | ICD-10-CM | POA: Diagnosis present

## 2015-08-02 DIAGNOSIS — K750 Abscess of liver: Secondary | ICD-10-CM | POA: Diagnosis not present

## 2015-08-02 DIAGNOSIS — K862 Cyst of pancreas: Secondary | ICD-10-CM

## 2015-08-02 DIAGNOSIS — E781 Pure hyperglyceridemia: Secondary | ICD-10-CM | POA: Diagnosis present

## 2015-08-02 DIAGNOSIS — R509 Fever, unspecified: Secondary | ICD-10-CM

## 2015-08-02 HISTORY — DX: Disorder of kidney and ureter, unspecified: N28.9

## 2015-08-02 LAB — CBC
HCT: 34.9 % — ABNORMAL LOW (ref 39.0–52.0)
HEMATOCRIT: 38.5 % — AB (ref 39.0–52.0)
HEMOGLOBIN: 12.7 g/dL — AB (ref 13.0–17.0)
Hemoglobin: 11.4 g/dL — ABNORMAL LOW (ref 13.0–17.0)
MCH: 31.5 pg (ref 26.0–34.0)
MCH: 30.8 pg (ref 26.0–34.0)
MCHC: 33 g/dL (ref 30.0–36.0)
MCHC: 32.7 g/dL (ref 30.0–36.0)
MCV: 95.5 fL (ref 78.0–100.0)
MCV: 94.3 fL (ref 78.0–100.0)
PLATELETS: 160 10*3/uL (ref 150–400)
PLATELETS: 159 10*3/uL (ref 150–400)
RBC: 4.03 MIL/uL — AB (ref 4.22–5.81)
RBC: 3.7 MIL/uL — AB (ref 4.22–5.81)
RDW: 13 % (ref 11.5–15.5)
RDW: 12.9 % (ref 11.5–15.5)
WBC: 10.1 10*3/uL (ref 4.0–10.5)
WBC: 9.8 10*3/uL (ref 4.0–10.5)

## 2015-08-02 LAB — COMPREHENSIVE METABOLIC PANEL
ALT: 23 U/L (ref 17–63)
ANION GAP: 13 (ref 5–15)
AST: 29 U/L (ref 15–41)
Albumin: 3.6 g/dL (ref 3.5–5.0)
Alkaline Phosphatase: 124 U/L (ref 38–126)
BUN: 57 mg/dL — ABNORMAL HIGH (ref 6–20)
CHLORIDE: 107 mmol/L (ref 101–111)
CO2: 23 mmol/L (ref 22–32)
CREATININE: 3.91 mg/dL — AB (ref 0.61–1.24)
Calcium: 9.1 mg/dL (ref 8.9–10.3)
GFR, EST AFRICAN AMERICAN: 15 mL/min — AB (ref >60)
GFR, EST NON AFRICAN AMERICAN: 13 mL/min — AB (ref >60)
Glucose, Bld: 106 mg/dL — ABNORMAL HIGH (ref 65–99)
POTASSIUM: 4.5 mmol/L (ref 3.5–5.1)
SODIUM: 143 mmol/L (ref 135–145)
Total Bilirubin: 1.1 mg/dL (ref 0.3–1.2)
Total Protein: 7.7 g/dL (ref 6.5–8.1)

## 2015-08-02 LAB — URINALYSIS, ROUTINE W REFLEX MICROSCOPIC
Glucose, UA: NEGATIVE mg/dL
KETONES UR: 15 mg/dL — AB
LEUKOCYTES UA: NEGATIVE
NITRITE: NEGATIVE
PH: 5 (ref 5.0–8.0)
Protein, ur: 30 mg/dL — AB
Specific Gravity, Urine: 1.015 (ref 1.005–1.030)
Urobilinogen, UA: 1 mg/dL (ref 0.0–1.0)

## 2015-08-02 LAB — CREATININE, SERUM
CREATININE: 3.83 mg/dL — AB (ref 0.61–1.24)
GFR calc Af Amer: 16 mL/min — ABNORMAL LOW (ref >60)
GFR calc non Af Amer: 14 mL/min — ABNORMAL LOW (ref >60)

## 2015-08-02 LAB — LIPID PANEL
CHOL/HDL RATIO: 5.3 ratio
CHOLESTEROL: 175 mg/dL (ref 0–200)
HDL: 33 mg/dL — ABNORMAL LOW (ref >40)
LDL Cholesterol: 116 mg/dL — ABNORMAL HIGH (ref 0–99)
Triglycerides: 128 mg/dL (ref <150)
VLDL: 26 mg/dL (ref 0–40)

## 2015-08-02 LAB — URINE MICROSCOPIC-ADD ON

## 2015-08-02 LAB — I-STAT CG4 LACTIC ACID, ED
Lactic Acid, Venous: 1.52 mmol/L (ref 0.5–2.0)
Lactic Acid, Venous: 0.66 mmol/L (ref 0.5–2.0)

## 2015-08-02 LAB — LIPASE, BLOOD: LIPASE: 545 U/L — AB (ref 22–51)

## 2015-08-02 MED ORDER — SODIUM CHLORIDE 0.9 % IV BOLUS (SEPSIS): 1000.0000 mL | Freq: Once | INTRAVENOUS | Status: DC

## 2015-08-02 MED ORDER — ONDANSETRON HCL 4 MG/2ML IJ SOLN
4.0000 mg | Freq: Once | INTRAMUSCULAR | Status: AC
  Administered 2015-08-02: 4 mg via INTRAVENOUS
  Filled 2015-08-02: qty 2

## 2015-08-02 MED ORDER — ENOXAPARIN SODIUM 30 MG/0.3ML ~~LOC~~ SOLN
30.0000 mg | SUBCUTANEOUS | Status: DC
  Administered 2015-08-02 – 2015-08-08 (×7): 30 mg via SUBCUTANEOUS
  Filled 2015-08-02 (×7): qty 0.3

## 2015-08-02 MED ORDER — SODIUM CHLORIDE 0.9 % IV SOLN: INTRAVENOUS | Status: AC

## 2015-08-02 MED ORDER — MORPHINE SULFATE (PF) 4 MG/ML IV SOLN
4.0000 mg | Freq: Once | INTRAVENOUS | Status: AC
  Administered 2015-08-02: 4 mg via INTRAVENOUS
  Filled 2015-08-02: qty 1

## 2015-08-02 MED ORDER — MORPHINE SULFATE (PF) 2 MG/ML IV SOLN
2.0000 mg | INTRAVENOUS | Status: DC | PRN
  Administered 2015-08-04 – 2015-08-06 (×2): 2 mg via INTRAVENOUS
  Filled 2015-08-02 (×2): qty 1

## 2015-08-02 MED ORDER — IOHEXOL 300 MG/ML  SOLN
50.0000 mL | INTRAMUSCULAR | Status: AC
  Administered 2015-08-02: 25 mL via ORAL

## 2015-08-02 MED ORDER — MIRTAZAPINE 15 MG PO TABS
15.0000 mg | ORAL_TABLET | Freq: Every day | ORAL | Status: DC
  Administered 2015-08-02 – 2015-08-08 (×7): 15 mg via ORAL
  Filled 2015-08-02 (×8): qty 1

## 2015-08-02 MED ORDER — ACETAMINOPHEN 325 MG PO TABS
650.0000 mg | ORAL_TABLET | Freq: Once | ORAL | Status: AC
  Administered 2015-08-03: 650 mg via ORAL
  Filled 2015-08-02: qty 2

## 2015-08-02 MED ORDER — SODIUM CHLORIDE 0.9 % IV SOLN
INTRAVENOUS | Status: DC
  Administered 2015-08-03 – 2015-08-04 (×3): via INTRAVENOUS

## 2015-08-02 MED ORDER — SODIUM CHLORIDE 0.9 % IV SOLN
INTRAVENOUS | Status: DC
  Administered 2015-08-02: 125 mL/h via INTRAVENOUS

## 2015-08-02 NOTE — ED Notes (Signed)
Pt here for abd pain and flank pain x 4 days with some N/V and chills

## 2015-08-02 NOTE — ED Notes (Signed)
MD at the bedside  

## 2015-08-02 NOTE — Progress Notes (Signed)
Patient has temp of 100.7 and due to vital sign parameters orders to call MD if above 100.5. Notified on-call hospitalist. Awaiting page/or orders. Will continue to monitor patient to end of shift.

## 2015-08-02 NOTE — H&P (Signed)
[facility] History and Physical  Pedro Young [id number] DOB: Dec 07, 1932 DOA: 08/02/2015  Referring physician: ED PCP: Michel Bickers, MD  Specialists: none  81 y/o ?  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 Lorrene Reid  Had some Chinese food which tasted a little bit off 07/28/15 Proceeded to have abdominal pain associated with nausea and anorexia 1 episode of vomiting 1 episode of diarrhea No further other instances of nausea nor vomiting No recent medication changes nor other illnesses No dark stool no tarry stool + Chills + fever overnight 08/01/15 Urine is always dark according to patient  Denies chest pain, cough, cold, rash, icterus, falls, Weakness, blurred vision, double vision, other complaints  Because of severe increasing pain in the abdomen 8/10 relieved by laying still, worsened by moving around and walking, patient came to the emergency room. Workup revealed  BUN = 57, creatinine = 3.9, baseline GFR is about 20 today it's 15 Lipase 545, other LFTs normal, lactic acid 1.5 Hemoglobin 12.7 WBC 10.1 Glucose 106 UA showed negative nitrites and leukocytes significant proteinuria  CT abdomen pelvis showed temperature changes placed distal body and tail of pancreas concerning for acute otitis pneumobilia, diverticulosis, atherosclerosis including left main and 3 VD calcified pleural plaques   Past Medical History  Diagnosis Date  . Renal disorder    History reviewed. No pertinent past surgical history. Social History:  Social History   Social History Narrative   From [place]   Moved to [place] to [place] 2006   5 daughters       Allergies  Allergen Reactions  . Amoxicillin-Pot Clavulanate   . Atorvastatin     REACTION: arthralgia  . Lovastatin     REACTION: arthralgia    Family History  Problem Relation Age of Onset  . Liver  disease Father   . Kidney disease Father      Prior to Admission medications   Medication Sig Start Date End Date Taking? Authorizing Provider  furosemide (LASIX) 40 MG tablet Take 40 mg by mouth daily. 07/31/15  Yes Historical Provider, MD  lisinopril (PRINIVIL,ZESTRIL) 20 MG tablet Take 20 mg by mouth daily.     Yes Historical Provider, MD  mirtazapine (REMERON) 15 MG tablet Take 15 mg by mouth at bedtime.     Yes Historical Provider, MD   Physical Exam: Filed Vitals:   08/02/15 1615 08/02/15 1630 08/02/15 1645 08/02/15 1700  BP: 120/63 121/69 125/65 139/78  Pulse: 88 90 88 92  Temp:      TempSrc:      Resp: 21 30 17 17   SpO2: 95% 94% 92% 94%    alert pleasant oriented no noted distress seems comfortable   EOMI NCAT   S1-S2 no murmur rub or gallop, slight JVD  no bruit   reflexes 2/3 Abdomen soft nontender nondistended no rebound but is just received morphine No lower extremity edema  power 5/5 bilaterally,   cta b no rakles rhonchi  Labs on Admission:  Basic Metabolic Panel:  Recent Labs Lab 08/02/15 1205  NA 143  K 4.5  CL 107  CO2 23  GLUCOSE 106*  BUN 57*  CREATININE 3.91*  CALCIUM 9.1   Liver Function Tests:  Recent Labs Lab 08/02/15 1205  AST 29  ALT 23  ALKPHOS 124  BILITOT 1.1  PROT 7.7  ALBUMIN 3.6    Recent Labs Lab 08/02/15  1205  LIPASE 545*   No results for input(s): AMMONIA in the last 168 hours. CBC:  Recent Labs Lab 08/02/15 1205  WBC 10.1  HGB 12.7*  HCT 38.5*  MCV 95.5  PLT 160   Cardiac Enzymes: No results for input(s): CKTOTAL, CKMB, CKMBINDEX, TROPONINI in the last 168 hours.  BNP (last 3 results) No results for input(s): BNP in the last 8760 hours.  ProBNP (last 3 results) No results for input(s): PROBNP in the last 8760 hours.  CBG: No results for input(s): GLUCAP in the last 168 hours.  Radiological Exams on Admission: Ct Abdomen Pelvis Wo Contrast  08/02/2015   CLINICAL DATA:  81 year old male with  abdominal pain and flank pain for the past 4 days with some associated nausea, vomiting and chills.  EXAM: CT ABDOMEN AND PELVIS WITHOUT CONTRAST  TECHNIQUE: Multidetector CT imaging of the abdomen and pelvis was performed following the standard protocol without IV contrast.  COMPARISON:  CT the abdomen and pelvis 05/26/2014.  FINDINGS: Lower chest: Calcified pleural plaques and the lower thorax bilaterally. Areas of scarring in the lower lobes of the lungs bilaterally. Atherosclerotic calcifications in the left main, left anterior descending, left circumflex and right coronary arteries.  Hepatobiliary: Pneumobilia. Status post cholecystectomy. No discrete cystic or solid hepatic lesions identified on today's noncontrast CT examination.  Pancreas: No discrete pancreatic mass. Inflammatory changes adjacent to the distal body and tail of the pancreas.  Spleen: Unremarkable.  Adrenals/Urinary Tract: Small calcification in the left renal hilum appears to be vascular. Bilateral kidneys are mildly atrophic. Several low-attenuation lesions in the right kidney are incompletely characterized on today's noncontrast CT examination, but favored to represent cysts. In addition, there are some intermediate to high attenuation lesions in the right kidney, largest which measures 8 mm in the upper pole, also incompletely characterized, but are favored to represent proteinaceous/hemorrhagic cysts. No hydroureteronephrosis. Urinary bladder is normal in appearance.  Stomach/Bowel: The stomach is decompressed, but otherwise unremarkable in appearance. No pathologic dilatation of small bowel or colon. Numerous colonic diverticulae are noted, particularly in the sigmoid colon, without surrounding inflammatory changes to suggest an acute diverticulitis at this time.  Vascular/Lymphatic: Extensive atherosclerotic calcifications throughout the abdominal and pelvic vasculature, without evidence of aneurysm. No lymphadenopathy noted in the  abdomen or pelvis on today's noncontrast CT examination.  Reproductive: Prostate gland and seminal vesicles are unremarkable in appearance.  Other: No significant volume of ascites.  No pneumoperitoneum.  Musculoskeletal: There are no aggressive appearing lytic or blastic lesions noted in the visualized portions of the skeleton.  IMPRESSION: 1. Inflammatory changes adjacent to the distal body and tail of the pancreas, concerning for acute pancreatitis. No definite pancreatic pseudocyst identified at this time. 2. Pneumobilia, presumably from prior sphincterotomy. 3. Colonic diverticulosis without evidence of acute diverticulitis at this time. 4. Status post cholecystectomy. 5. Extensive atherosclerosis, including left main and 3 vessel coronary artery disease. 6. Calcified pleural plaques in the lower thorax bilaterally, suggestive of asbestos related pleural disease.   Electronically Signed   By: Vinnie Langton M.D.   On: 08/02/2015 16:13    EKG: Independently reviewed. None required  Assessment/Plan Principal Problem:   Acute pancreatitis -DDx Hypertg vs Idopathic vs CBD stone -IV saline 125 cc/hr Rpt Lipase am -Get Lipid panel -Morphine for pain CLD and grad as tolerated -none of his meds are known precipitants of pancreatitis Active Problems:    Pure hyperglyceridemia -see above    Anemia of chronic renal failure, stage 4 (severe) =-  sees Dr. Elisabeth Most -Saline as above -hb stable -no need replcement -consder Colonoscopy as OP   Hematuria -chronic issue -might need Cysto?  Only a smoker for 3 yrs in youth    HYPERTENSION, BENIGN ESSENTIAL -hold Anti-htn for now -would probably choose non ace/diuretic meds on d/c    Liver abscess via hepatic artery 2011, 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    AKI (acute kidney injury) -aki 2/2 to vol depletion and ATN -Monitor am labs  DNR D/w family Med surg 3-4 days     [facility] Pager [phone]  If  7PM-7AM, please contact night-coverage [email or web address] Password [facility] 08/02/2015, 5:17 PM

## 2015-08-02 NOTE — ED Notes (Signed)
Report attempted, RN to call back. 

## 2015-08-02 NOTE — ED Provider Notes (Signed)
CSN: [id number]     Arrival date & time 08/02/15  1051 History   First MD Initiated Contact with Patient 08/02/15 1144     Chief Complaint  Patient presents with  . Flank Pain  . Abdominal Pain     (Consider location/radiation/quality/duration/timing/severity/associated sxs/prior Treatment) HPI Comments: Patient presents to the ER for evaluation of abdominal pain. Patient reports that he started having lower abdominal pain 4 days ago. Pain goes into the lower back and bilateral flank area. Patient reports progressively worsening pain diffusely across his lower abdomen. He has had associated nausea and vomiting. He has been expressing chills but has not documented any fever. There is no diarrhea, constipation, rectal bleeding.  Patient is a 81 y.o. male presenting with flank pain and abdominal pain.  Flank Pain Associated symptoms include abdominal pain.  Abdominal Pain   Past Medical History  Diagnosis Date  . Renal disorder    History reviewed. No pertinent past surgical history. History reviewed. No pertinent family history. Social History  Substance Use Topics  . Smoking status: Never Smoker   . Smokeless tobacco: None  . Alcohol Use: No    Review of Systems  Gastrointestinal: Positive for abdominal pain.  Genitourinary: Positive for flank pain.  All other systems reviewed and are negative.     Allergies  Amoxicillin-pot clavulanate; Atorvastatin; and Lovastatin  Home Medications   Prior to Admission medications   Medication Sig Start Date End Date Taking? Authorizing Provider  furosemide (LASIX) 40 MG tablet Take 40 mg by mouth daily. 07/31/15  Yes Historical Provider, MD  lisinopril (PRINIVIL,ZESTRIL) 20 MG tablet Take 20 mg by mouth daily.     Yes Historical Provider, MD  mirtazapine (REMERON) 15 MG tablet Take 15 mg by mouth at bedtime.     Yes Historical Provider, MD   BP 126/69 mmHg  Pulse 91  Temp(Src) 98 F (36.7 C) (Oral)  Resp 27  SpO2 95% Physical  Exam  Constitutional: He is oriented to person, place, and time. He appears well-developed and well-nourished. No distress.  HENT:  Head: Normocephalic and atraumatic.  Right Ear: Hearing normal.  Left Ear: Hearing normal.  Nose: Nose normal.  Mouth/Throat: Oropharynx is clear and moist and mucous membranes are normal.  Eyes: Conjunctivae and EOM are normal. Pupils are equal, round, and reactive to light.  Neck: Normal range of motion. Neck supple.  Cardiovascular: Regular rhythm, S1 normal and S2 normal.  Exam reveals no gallop and no friction rub.   No murmur heard. Pulmonary/Chest: Effort normal and breath sounds normal. No respiratory distress. He exhibits no tenderness.  Abdominal: Soft. Normal appearance and bowel sounds are normal. There is no hepatosplenomegaly. There is tenderness in the right lower quadrant, suprapubic area and left lower quadrant. There is no rebound, no guarding, no tenderness at McBurney's point and negative Murphy's sign. No hernia.  Musculoskeletal: Normal range of motion.  Neurological: He is alert and oriented to person, place, and time. He has normal strength. No cranial nerve deficit or sensory deficit. Coordination normal. GCS eye subscore is 4. GCS verbal subscore is 5. GCS motor subscore is 6.  Skin: Skin is warm, dry and intact. No rash noted. No cyanosis.  Psychiatric: He has a normal mood and affect. His speech is normal and behavior is normal. Thought content normal.  Nursing note and vitals reviewed.   ED Course  Procedures (including critical care time) Labs Review Labs Reviewed  LIPASE, BLOOD - Abnormal; Notable for the following:  Lipase 545 (*)    All other components within normal limits  COMPREHENSIVE METABOLIC PANEL - Abnormal; Notable for the following:    Glucose, Bld 106 (*)    BUN 57 (*)    Creatinine, Ser 3.91 (*)    GFR calc non Af Amer 13 (*)    GFR calc Af Amer 15 (*)    All other components within normal limits  CBC -  Abnormal; Notable for the following:    RBC 4.03 (*)    Hemoglobin 12.7 (*)    HCT 38.5 (*)    All other components within normal limits  URINALYSIS, ROUTINE W REFLEX MICROSCOPIC (NOT AT [facility]) - Abnormal; Notable for the following:    Color, Urine AMBER (*)    APPearance CLOUDY (*)    Hgb urine dipstick LARGE (*)    Bilirubin Urine SMALL (*)    Ketones, ur 15 (*)    Protein, ur 30 (*)    All other components within normal limits  URINE MICROSCOPIC-ADD ON - Abnormal; Notable for the following:    Squamous Epithelial / LPF FEW (*)    Bacteria, UA MANY (*)    Casts GRANULAR CAST (*)    All other components within normal limits  I-STAT CG4 LACTIC ACID, ED  I-STAT CG4 LACTIC ACID, ED    Imaging Review No results found. I have personally reviewed and evaluated these images and lab results as part of my medical decision-making.   EKG Interpretation None      MDM   Final diagnoses:  None   pancreatitis  Patient presents to the ER for evaluation of bilateral back and flank pain with abdominal pain, nausea and vomiting. Symptoms ongoing for 4 days. Patient reports that he has not been able to eat or drink because of the pain. Basic workup reveals normal CBC. Comprehensive metabolic panel was normal other than renal insufficiency, patient does have baseline chronic renal insufficiency. Lipase was elevated at 545. This is consistent with pancreatitis. Patient does not have a previous history of pancreatitis. He does report history of liver abscess secondary to Escherichia coli in 2011.  Patient underwent CT scan to further evaluate. Case signed out to oncoming ER physician to follow-up on CT scan. Anticipate hospitalization for further treatment of pancreatitis.    Orpah Greek, MD 08/03/15 2201

## 2015-08-03 LAB — COMPREHENSIVE METABOLIC PANEL
ALBUMIN: 2.7 g/dL — AB (ref 3.5–5.0)
ALT: 25 U/L (ref 17–63)
ANION GAP: 8 (ref 5–15)
AST: 29 U/L (ref 15–41)
Alkaline Phosphatase: 147 U/L — ABNORMAL HIGH (ref 38–126)
BUN: 56 mg/dL — ABNORMAL HIGH (ref 6–20)
CALCIUM: 7.9 mg/dL — AB (ref 8.9–10.3)
CO2: 25 mmol/L (ref 22–32)
Chloride: 106 mmol/L (ref 101–111)
Creatinine, Ser: 3.85 mg/dL — ABNORMAL HIGH (ref 0.61–1.24)
GFR calc non Af Amer: 14 mL/min — ABNORMAL LOW (ref >60)
GFR, EST AFRICAN AMERICAN: 16 mL/min — AB (ref >60)
GLUCOSE: 106 mg/dL — AB (ref 65–99)
POTASSIUM: 4.7 mmol/L (ref 3.5–5.1)
SODIUM: 139 mmol/L (ref 135–145)
Total Bilirubin: 1 mg/dL (ref 0.3–1.2)
Total Protein: 6.1 g/dL — ABNORMAL LOW (ref 6.5–8.1)

## 2015-08-03 LAB — CBC
HEMATOCRIT: 32.1 % — AB (ref 39.0–52.0)
HEMOGLOBIN: 10.6 g/dL — AB (ref 13.0–17.0)
MCH: 31.1 pg (ref 26.0–34.0)
MCHC: 33 g/dL (ref 30.0–36.0)
MCV: 94.1 fL (ref 78.0–100.0)
Platelets: 154 10*3/uL (ref 150–400)
RBC: 3.41 MIL/uL — ABNORMAL LOW (ref 4.22–5.81)
RDW: 12.9 % (ref 11.5–15.5)
WBC: 10.3 10*3/uL (ref 4.0–10.5)

## 2015-08-03 LAB — PROTIME-INR
INR: 1.25 (ref 0.00–1.49)
PROTHROMBIN TIME: 15.8 s — AB (ref 11.6–15.2)

## 2015-08-03 LAB — LIPASE, BLOOD: Lipase: 261 U/L — ABNORMAL HIGH (ref 22–51)

## 2015-08-03 MED ORDER — ACETAMINOPHEN 325 MG PO TABS
650.0000 mg | ORAL_TABLET | Freq: Once | ORAL | Status: AC
  Administered 2015-08-03: 650 mg via ORAL
  Filled 2015-08-03: qty 2

## 2015-08-03 NOTE — Progress Notes (Signed)
Pedro Young [id number] DOB: 03/20/1933 DOA: 08/02/2015 PCP: Michel Bickers, MD  Brief narrative: 81 y/o ?  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 Lorrene Reid  Admitted with acute pancreatitis 9/30  Past medical history-As per Problem list Chart reviewed as below- Review  Consultants:  None  Procedures:  CT  Antibiotics:  None   Subjective   Some chills overnight but no other issues right now Ordered breakfast and ate some of it-had fruit   Objective    Interim History:   Telemetry:    Objective: Filed Vitals:   08/02/15 1730 08/02/15 1830 08/02/15 2119 08/02/15 2300  BP: 125/66 135/73 111/61   Pulse: 87 89 107   Temp:  99 F (37.2 C) 100.7 F (38.2 C) 99 F (37.2 C)  TempSrc:  Oral Oral Oral  Resp: 25 20 20    SpO2: 93% 94% 93%     Intake/Output Summary (Last 24 hours) at 08/03/15 0952 Last data filed at 08/03/15 0643  Gross per 24 hour  Intake 1731.25 ml  Output    125 ml  Net 1606.25 ml    Exam:  General: EOMI NCAT Cardiovascular: S1-S2 no murmur rub or gallop Respiratory: Clinically clear Abdomen: Slightly tender in epigastrium but no other issues Skin no lower extremity edema Neuro intact   Data Reviewed: Basic Metabolic Panel:  Recent Labs Lab 08/02/15 1205 08/02/15 1705 08/03/15 0343  NA 143  --  139  K 4.5  --  4.7  CL 107  --  106  CO2 23  --  25  GLUCOSE 106*  --  106*  BUN 57*  --  56*  CREATININE 3.91* 3.83* 3.85*  CALCIUM 9.1  --  7.9*   Liver Function Tests:  Recent Labs Lab 08/02/15 1205 08/03/15 0343  AST 29 29  ALT 23 25  ALKPHOS 124 147*  BILITOT 1.1 1.0  PROT 7.7 6.1*  ALBUMIN 3.6 2.7*    Recent Labs Lab 08/02/15 1205 08/03/15 0343  LIPASE 545* 261*   No results for input(s): AMMONIA in the last 168 hours. CBC:  Recent Labs Lab 08/02/15 1205 08/02/15 1705 08/03/15 0343  WBC  10.1 9.8 10.3  HGB 12.7* 11.4* 10.6*  HCT 38.5* 34.9* 32.1*  MCV 95.5 94.3 94.1  PLT 160 159 154   Cardiac Enzymes: No results for input(s): CKTOTAL, CKMB, CKMBINDEX, TROPONINI in the last 168 hours. BNP: Invalid input(s): POCBNP CBG: No results for input(s): GLUCAP in the last 168 hours.  No results found for this or any previous visit (from the past 240 hour(s)).   Studies:              All Imaging reviewed and is as per above notation   Scheduled Meds: . sodium chloride   Intravenous STAT  . enoxaparin (LOVENOX) injection  30 mg Subcutaneous Q24H  . mirtazapine  15 mg Oral QHS   Continuous Infusions: . sodium chloride 125 mL/hr (08/02/15 1652)  . sodium chloride 125 mL/hr at 08/03/15 0135     Assessment/Plan:  Principal Problem:  Acute pancreatitis -DDx Hypertg vs Idopathic vs CBD stone -IV saline 125 cc/hr -Rpt Lipase has trended down from 554-->261 -Triglycerides normal at 128 so this is not the etiology -Morphine for pain   Pure hyperglyceridemia -see above   Anemia of chronic renal failure, stage 4 (severe) -sees Dr. Elisabeth Most -Saline as above  Anemia of renal disease -hb  stable -no need replcement -consder Colonoscopy as OP  Hematuria -chronic issue -might need Cysto? Only a smoker for 3 yrs in youth   HYPERTENSION, BENIGN ESSENTIAL -hold Anti-htn for now -would probably choose non ace/diuretic meds on d/c   Liver abscess via hepatic artery 2011, 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   AKI (acute kidney injury) -aki 2/2 to vol depletion and ATN -Labs have remained stable  DNR No family present May be able to discharge in 1-2 days if continues to make good progress   [name], MD  [facility] Pager [phone] 08/03/2015, 9:52 AM    LOS: 1 day

## 2015-08-03 NOTE — Progress Notes (Signed)
Orders given for tylenol. Patient has chills and gets very cold that last for about 20-55minutes. Then is very hot. Explained to patient and wife that it may be due to the pancreatitis causing him to feel that way for it occurs about every 2-3 hours but only has a low-grade temperature. Notified on-call doctor and orders given for one time dose of tylenol. Tyleno given and will continue to monitor patient to end of shift.

## 2015-08-04 ENCOUNTER — Inpatient Hospital Stay (HOSPITAL_COMMUNITY): Payer: Medicare HMO

## 2015-08-04 DIAGNOSIS — E781 Pure hyperglyceridemia: Secondary | ICD-10-CM

## 2015-08-04 LAB — URINALYSIS, ROUTINE W REFLEX MICROSCOPIC
BILIRUBIN URINE: NEGATIVE
Glucose, UA: NEGATIVE mg/dL
KETONES UR: NEGATIVE mg/dL
LEUKOCYTES UA: NEGATIVE
NITRITE: NEGATIVE
PROTEIN: 30 mg/dL — AB
Specific Gravity, Urine: 1.012 (ref 1.005–1.030)
Urobilinogen, UA: 4 mg/dL — ABNORMAL HIGH (ref 0.0–1.0)
pH: 5.5 (ref 5.0–8.0)

## 2015-08-04 LAB — URINE MICROSCOPIC-ADD ON

## 2015-08-04 LAB — LIPASE, BLOOD: LIPASE: 504 U/L — AB (ref 22–51)

## 2015-08-04 MED ORDER — ACETAMINOPHEN 325 MG PO TABS
650.0000 mg | ORAL_TABLET | Freq: Once | ORAL | Status: DC
  Filled 2015-08-04: qty 2

## 2015-08-04 MED ORDER — OXYCODONE-ACETAMINOPHEN 5-325 MG PO TABS: 1.0000 | ORAL_TABLET | ORAL | Status: DC | PRN

## 2015-08-04 NOTE — Progress Notes (Signed)
Aydeen Strathman [id number] DOB: 01-30-1933 DOA: 08/02/2015 PCP: Michel Bickers, MD  Brief narrative: 81 y/o ?  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 Lorrene Reid  Admitted with acute pancreatitis 9/30  Past medical history-As per Problem list Chart reviewed as below- Review  Consultants:  None  Procedures:  CT  Antibiotics:  None   Subjective   Some chills overnight  Has had some shaking spells and forgetfulness-also had some urinary incotinence No n/v tol diet fair    Objective    Interim History:   Telemetry:    Objective: Filed Vitals:   08/04/15 1202 08/04/15 1335 08/04/15 1508 08/04/15 1524  BP:  140/82    Pulse:  103    Temp: 98.9 F (37.2 C) 99.7 F (37.6 C) 101.4 F (38.6 C) 100.5 F (38.1 C)  TempSrc: Oral Oral Oral Oral  Resp:  20    SpO2:  91%      Intake/Output Summary (Last 24 hours) at 08/04/15 1640 Last data filed at 08/04/15 1500  Gross per 24 hour  Intake   1565 ml  Output    220 ml  Net   1345 ml    Exam:  General: EOMI NCAT Cardiovascular: S1-S2 no murmur rub or gallop Respiratory: Clinically clear Abdomen: Slightly tender in epigastrium but no other issues Skin no lower extremity edema Neuro intact and moving 4 limbs no efcit and no tremor  Data Reviewed: Basic Metabolic Panel:  Recent Labs Lab 08/02/15 1205 08/02/15 1705 08/03/15 0343  NA 143  --  139  K 4.5  --  4.7  CL 107  --  106  CO2 23  --  25  GLUCOSE 106*  --  106*  BUN 57*  --  56*  CREATININE 3.91* 3.83* 3.85*  CALCIUM 9.1  --  7.9*   Liver Function Tests:  Recent Labs Lab 08/02/15 1205 08/03/15 0343  AST 29 29  ALT 23 25  ALKPHOS 124 147*  BILITOT 1.1 1.0  PROT 7.7 6.1*  ALBUMIN 3.6 2.7*    Recent Labs Lab 08/02/15 1205 08/03/15 0343  LIPASE 545* 261*   No results for input(s): AMMONIA in the last 168  hours. CBC:  Recent Labs Lab 08/02/15 1205 08/02/15 1705 08/03/15 0343  WBC 10.1 9.8 10.3  HGB 12.7* 11.4* 10.6*  HCT 38.5* 34.9* 32.1*  MCV 95.5 94.3 94.1  PLT 160 159 154   Cardiac Enzymes: No results for input(s): CKTOTAL, CKMB, CKMBINDEX, TROPONINI in the last 168 hours. BNP: Invalid input(s): POCBNP CBG: No results for input(s): GLUCAP in the last 168 hours.  No results found for this or any previous visit (from the past 240 hour(s)).   Studies:              All Imaging reviewed and is as per above notation   Scheduled Meds: . acetaminophen  650 mg Oral Once  . enoxaparin (LOVENOX) injection  30 mg Subcutaneous Q24H  . mirtazapine  15 mg Oral QHS   Continuous Infusions:     Assessment/Plan:  Principal Problem:  Acute pancreatitis -DDx Hypertg vs Idopathic vs CBD stone -IV saline 125 cc/hr -Rpt Lipase has trended down from 554-->261 -Triglycerides normal at 128 so this is not the etiology -we will d/c morphine and IV pain med and monitor  ? Sepsis Spike temp overnight and has a temp oday 101 -follow blood cult -would re-image if  further fever or chills and add Primaxin if further spikes fever  -abd pain better so not too worried about necrosis of pancrea -would hold   Seizure like activty? -cannot be corroborated -hold EEG for now   Pure hyperglyceridemia -see above   Anemia of chronic renal failure, stage 4 (severe) -sees Dr. Lorrene Reid -Saline as above  Anemia of renal disease -hb stable -no need replcement -consider Colonoscopy as OP  Hematuria -chronic issue -might need Cysto? 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

## 2015-08-05 ENCOUNTER — Inpatient Hospital Stay (HOSPITAL_COMMUNITY): Payer: Medicare HMO

## 2015-08-05 LAB — COMPREHENSIVE METABOLIC PANEL
ALBUMIN: 2.3 g/dL — AB (ref 3.5–5.0)
ALK PHOS: 181 U/L — AB (ref 38–126)
ALT: 23 U/L (ref 17–63)
AST: 21 U/L (ref 15–41)
Anion gap: 9 (ref 5–15)
BUN: 35 mg/dL — ABNORMAL HIGH (ref 6–20)
CALCIUM: 8 mg/dL — AB (ref 8.9–10.3)
CHLORIDE: 108 mmol/L (ref 101–111)
CO2: 23 mmol/L (ref 22–32)
CREATININE: 2.6 mg/dL — AB (ref 0.61–1.24)
GFR calc Af Amer: 25 mL/min — ABNORMAL LOW (ref >60)
GFR calc non Af Amer: 22 mL/min — ABNORMAL LOW (ref >60)
GLUCOSE: 98 mg/dL (ref 65–99)
Potassium: 4.3 mmol/L (ref 3.5–5.1)
SODIUM: 140 mmol/L (ref 135–145)
Total Bilirubin: 1.4 mg/dL — ABNORMAL HIGH (ref 0.3–1.2)
Total Protein: 5.6 g/dL — ABNORMAL LOW (ref 6.5–8.1)

## 2015-08-05 LAB — CBC WITH DIFFERENTIAL/PLATELET
BASOS PCT: 0 %
Basophils Absolute: 0 10*3/uL (ref 0.0–0.1)
EOS ABS: 0.1 10*3/uL (ref 0.0–0.7)
Eosinophils Relative: 1 %
HEMATOCRIT: 30.1 % — AB (ref 39.0–52.0)
HEMOGLOBIN: 10.2 g/dL — AB (ref 13.0–17.0)
LYMPHS ABS: 1.4 10*3/uL (ref 0.7–4.0)
Lymphocytes Relative: 14 %
MCH: 31.4 pg (ref 26.0–34.0)
MCHC: 33.9 g/dL (ref 30.0–36.0)
MCV: 92.6 fL (ref 78.0–100.0)
MONO ABS: 1.7 10*3/uL — AB (ref 0.1–1.0)
MONOS PCT: 17 %
NEUTROS PCT: 68 %
Neutro Abs: 6.9 10*3/uL (ref 1.7–7.7)
Platelets: 136 10*3/uL — ABNORMAL LOW (ref 150–400)
RBC: 3.25 MIL/uL — ABNORMAL LOW (ref 4.22–5.81)
RDW: 12.9 % (ref 11.5–15.5)
WBC: 10.2 10*3/uL (ref 4.0–10.5)

## 2015-08-05 LAB — LIPASE, BLOOD: Lipase: 880 U/L — ABNORMAL HIGH (ref 22–51)

## 2015-08-05 LAB — URINE CULTURE

## 2015-08-05 MED ORDER — OXYCODONE-ACETAMINOPHEN 5-325 MG PO TABS: 1.0000 | ORAL_TABLET | ORAL | Status: DC | PRN

## 2015-08-05 MED ORDER — IOHEXOL 300 MG/ML  SOLN
50.0000 mL | Freq: Once | INTRAMUSCULAR | Status: AC | PRN
  Administered 2015-08-05: 50 mL via ORAL

## 2015-08-05 MED ORDER — ACETAMINOPHEN 325 MG PO TABS: 650.0000 mg | ORAL_TABLET | Freq: Once | ORAL | Status: DC

## 2015-08-05 MED ORDER — SORBITOL 70 % SOLN: 30.0000 mL | Freq: Every day | Status: DC

## 2015-08-05 MED ORDER — SORBITOL 70 % SOLN
30.0000 mL | Freq: Every day | Status: DC
  Administered 2015-08-05 – 2015-08-06 (×2): 30 mL via ORAL
  Filled 2015-08-05 (×2): qty 30

## 2015-08-05 NOTE — Progress Notes (Signed)
Pedro Young [id number] DOB: 07/26/1933 DOA: 08/02/2015 PCP: Michel Bickers, MD  Brief narrative: 81 y/o ?  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 Lorrene Reid  Admitted with acute pancreatitis 9/30  Past medical history-As per Problem list Chart reviewed as below- Review  Consultants:  None  Procedures:  CT  Antibiotics:  None   Subjective   still having low-grade fever 100.3 Tolerating diet well however Claims pain is not terrible Family bedside Was constipated but responded well to sorbitol with stools     Objective    Interim History:   Telemetry:    Objective: Filed Vitals:   08/04/15 1805 08/04/15 2136 08/05/15 0200 08/05/15 0640  BP:  141/74  141/76  Pulse:  89  83  Temp: 99.7 F (37.6 C) 99 F (37.2 C) 98.8 F (37.1 C) 98.5 F (36.9 C)  TempSrc: Oral Oral Oral Oral  Resp:  18  16  SpO2:  94%  95%    Intake/Output Summary (Last 24 hours) at 08/05/15 0830 Last data filed at 08/05/15 0200  Gross per 24 hour  Intake    450 ml  Output    200 ml  Net    250 ml    Exam:  General: EOMI NCAT Cardiovascular: S1-S2 no murmur rub or gallop Respiratory: Clinically clear Abdomen: Slightly tender in epigastrium but no other issues Skin no lower extremity edema Neuro intact and moving 4 limbs no efcit and no tremor  Data Reviewed: Basic Metabolic Panel:  Recent Labs Lab 08/02/15 1205 08/02/15 1705 08/03/15 0343 08/05/15 0506  NA 143  --  139 140  K 4.5  --  4.7 4.3  CL 107  --  106 108  CO2 23  --  25 23  GLUCOSE 106*  --  106* 98  BUN 57*  --  56* 35*  CREATININE 3.91* 3.83* 3.85* 2.60*  CALCIUM 9.1  --  7.9* 8.0*   Liver Function Tests:  Recent Labs Lab 08/02/15 1205 08/03/15 0343 08/05/15 0506  AST 29 29 21   ALT 23 25 23   ALKPHOS 124 147* 181*  BILITOT 1.1 1.0 1.4*  PROT 7.7 6.1* 5.6*  ALBUMIN 3.6 2.7*  2.3*    Recent Labs Lab 08/02/15 1205 08/03/15 0343 08/04/15 1718  LIPASE 545* 261* 504*   No results for input(s): AMMONIA in the last 168 hours. CBC:  Recent Labs Lab 08/02/15 1205 08/02/15 1705 08/03/15 0343 08/05/15 0506  WBC 10.1 9.8 10.3 10.2  NEUTROABS  --   --   --  6.9  HGB 12.7* 11.4* 10.6* 10.2*  HCT 38.5* 34.9* 32.1* 30.1*  MCV 95.5 94.3 94.1 92.6  PLT 160 159 154 136*   Cardiac Enzymes: No results for input(s): CKTOTAL, CKMB, CKMBINDEX, TROPONINI in the last 168 hours. BNP: Invalid input(s): POCBNP CBG: No results for input(s): GLUCAP in the last 168 hours.  Recent Results (from the past 240 hour(s))  Culture, Urine     Status: None   Collection Time: 08/04/15  2:33 AM  Result Value Ref Range Status   Specimen Description URINE, CLEAN CATCH  Final   Special Requests NONE  Final   Culture MULTIPLE SPECIES PRESENT, SUGGEST RECOLLECTION  Final   Report Status 08/05/2015 FINAL  Final     Studies:              All Imaging reviewed and is as per above notation  Scheduled Meds: . acetaminophen  650 mg Oral Once  . enoxaparin (LOVENOX) injection  30 mg Subcutaneous Q24H  . mirtazapine  15 mg Oral QHS   Continuous Infusions:     Assessment/Plan:  Principal Problem:  Acute pancreatitis -DDx Hypertg vs Idopathic vs CBD stone -IV saline 125 cc/hr -Rpt Lipase has trended down from 554-->261 -Triglycerides normal at 128 so this is not the etiology -we will d/c morphine and IV pain med and monitor -Monitor LFTs and lipase in a.m.  ? Sepsis Spike temp overnight and has a temp day 101 -follow blood cultfrom 10/1 although urine cultures negative -get noncontrast CT pancreatic cyst and other complications although technically he doesn't really look like he is grossly infected having any sepsis or decompensating -I would say his Ranson score is low and I'm not worried about pancreatic necrosis  Seizure like activty? -cannot be corroborated' -Likely  secondary to chills -no workup at this time   Pure hyperglyceridemia -see above -LDL 116, triglycerides 128   Anemia of chronic renal failure, stage 4 (severe) -sees Dr. Lorrene Reid -Saline as above  Anemia of renal disease -hb stable -no need replcement -consider Colonoscopy as OP  Hematuria -chronic issue -might need Cysto? 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

## 2015-08-06 LAB — COMPREHENSIVE METABOLIC PANEL
ALBUMIN: 2.4 g/dL — AB (ref 3.5–5.0)
ALT: 24 U/L (ref 17–63)
AST: 23 U/L (ref 15–41)
Alkaline Phosphatase: 220 U/L — ABNORMAL HIGH (ref 38–126)
Anion gap: 9 (ref 5–15)
BUN: 31 mg/dL — AB (ref 6–20)
CHLORIDE: 106 mmol/L (ref 101–111)
CO2: 23 mmol/L (ref 22–32)
CREATININE: 2.48 mg/dL — AB (ref 0.61–1.24)
Calcium: 8.2 mg/dL — ABNORMAL LOW (ref 8.9–10.3)
GFR calc Af Amer: 27 mL/min — ABNORMAL LOW (ref >60)
GFR, EST NON AFRICAN AMERICAN: 23 mL/min — AB (ref >60)
GLUCOSE: 107 mg/dL — AB (ref 65–99)
Potassium: 4.1 mmol/L (ref 3.5–5.1)
SODIUM: 138 mmol/L (ref 135–145)
Total Bilirubin: 1.1 mg/dL (ref 0.3–1.2)
Total Protein: 6.1 g/dL — ABNORMAL LOW (ref 6.5–8.1)

## 2015-08-06 LAB — LIPASE, BLOOD: Lipase: 508 U/L — ABNORMAL HIGH (ref 22–51)

## 2015-08-06 MED ORDER — ACETAMINOPHEN 325 MG PO TABS
650.0000 mg | ORAL_TABLET | Freq: Once | ORAL | Status: AC
  Administered 2015-08-06: 650 mg via ORAL

## 2015-08-06 MED ORDER — SODIUM CHLORIDE 0.9 % IV SOLN
INTRAVENOUS | Status: DC
  Administered 2015-08-06 – 2015-08-08 (×4): via INTRAVENOUS

## 2015-08-06 MED ORDER — SODIUM CHLORIDE 0.9 % IV BOLUS (SEPSIS)
500.0000 mL | Freq: Once | INTRAVENOUS | Status: AC
  Administered 2015-08-06: 500 mL via INTRAVENOUS

## 2015-08-06 NOTE — Care Management Important Message (Signed)
Important Message  Patient Details  Name: Pedro Young MRN: [id number] Date of Birth: January 07, 1933   Medicare Important Message Given:  Yes-second notification given    Delorse Lek 08/06/2015, 1:33 PM

## 2015-08-06 NOTE — Progress Notes (Signed)
Pedro Young [id number] DOB: 1933-02-14 DOA: 08/02/2015 PCP: Michel Bickers, MD  Brief narrative: 81 y/o ?  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 Lorrene Reid  Admitted with acute pancreatitis 9/30 and continued to have low grade fevers which were cult neg  Past medical history-As per Problem list Chart reviewed as below- Review  Consultants:  None  Procedures:  CT  Antibiotics:  None   Subjective   Fair appetite poor no n/v No cp Ambulant to some extent Pain moderate No diarr   Objective    Interim History:   Telemetry:    Objective: Filed Vitals:   08/05/15 1603 08/05/15 1844 08/05/15 2226 08/06/15 0455  BP:   150/81 140/75  Pulse:   97 86  Temp: 100.3 F (37.9 C) 99.8 F (37.7 C) 100 F (37.8 C) 100 F (37.8 C)  TempSrc: Oral  Oral   Resp:   15 15  Height:      Weight:      SpO2:   96% 95%    Intake/Output Summary (Last 24 hours) at 08/06/15 1134 Last data filed at 08/06/15 0455  Gross per 24 hour  Intake    350 ml  Output    650 ml  Net   -300 ml    Exam:  General: EOMI NCAT Cardiovascular: S1-S2 no murmur rub or gallop Respiratory: Clinically clear Abdomen: Slightly tender in epigastrium but no other issues Skin no lower extremity edema Neuro intact and moving 4 limbs no efcit and no tremor  Data Reviewed: Basic Metabolic Panel:  Recent Labs Lab 08/02/15 1205 08/02/15 1705 08/03/15 0343 08/05/15 0506 08/06/15 0643  NA 143  --  139 140 138  K 4.5  --  4.7 4.3 4.1  CL 107  --  106 108 106  CO2 23  --  25 23 23   GLUCOSE 106*  --  106* 98 107*  BUN 57*  --  56* 35* 31*  CREATININE 3.91* 3.83* 3.85* 2.60* 2.48*  CALCIUM 9.1  --  7.9* 8.0* 8.2*   Liver Function Tests:  Recent Labs Lab 08/02/15 1205 08/03/15 0343 08/05/15 0506 08/06/15 0643  AST 29 29 21 23   ALT 23 25 23 24   ALKPHOS 124 147* 181* 220*    BILITOT 1.1 1.0 1.4* 1.1  PROT 7.7 6.1* 5.6* 6.1*  ALBUMIN 3.6 2.7* 2.3* 2.4*    Recent Labs Lab 08/02/15 1205 08/03/15 0343 08/04/15 1718 08/05/15 1755 08/06/15 0643  LIPASE 545* 261* 504* 880* 508*   No results for input(s): AMMONIA in the last 168 hours. CBC:  Recent Labs Lab 08/02/15 1205 08/02/15 1705 08/03/15 0343 08/05/15 0506  WBC 10.1 9.8 10.3 10.2  NEUTROABS  --   --   --  6.9  HGB 12.7* 11.4* 10.6* 10.2*  HCT 38.5* 34.9* 32.1* 30.1*  MCV 95.5 94.3 94.1 92.6  PLT 160 159 154 136*   Cardiac Enzymes: No results for input(s): CKTOTAL, CKMB, CKMBINDEX, TROPONINI in the last 168 hours. BNP: Invalid input(s): POCBNP CBG: No results for input(s): GLUCAP in the last 168 hours.  Recent Results (from the past 240 hour(s))  Culture, Urine     Status: None   Collection Time: 08/04/15  2:33 AM  Result Value Ref Range Status   Specimen Description URINE, CLEAN CATCH  Final   Special Requests NONE  Final   Culture MULTIPLE SPECIES PRESENT, SUGGEST RECOLLECTION  Final  Report Status 08/05/2015 FINAL  Final  Culture, blood (routine x 2)     Status: None (Preliminary result)   Collection Time: 08/04/15  2:37 AM  Result Value Ref Range Status   Specimen Description BLOOD LEFT ANTECUBITAL  Final   Special Requests BOTTLES DRAWN AEROBIC AND ANAEROBIC 10CC  Final   Culture NO GROWTH 1 DAY  Final   Report Status PENDING  Incomplete  Culture, blood (routine x 2)     Status: None (Preliminary result)   Collection Time: 08/04/15  2:43 AM  Result Value Ref Range Status   Specimen Description BLOOD LEFT ARM  Final   Special Requests BOTTLES DRAWN AEROBIC AND ANAEROBIC 10CC  Final   Culture NO GROWTH 1 DAY  Final   Report Status PENDING  Incomplete     Studies:              All Imaging reviewed and is as per above notation   Scheduled Meds: . acetaminophen  650 mg Oral Once  . enoxaparin (LOVENOX) injection  30 mg Subcutaneous Q24H  . mirtazapine  15 mg Oral QHS  .  sorbitol  30 mL Oral Daily   Continuous Infusions:     Assessment/Plan:  Principal Problem:  Acute pancreatitis -DDx Hypertg vs Idopathic vs CBD stone -IV saline 125 cc/hr -Rpt Lipase has trended decreased but has increased aagin to 500 range  -CT abd showed mild increased inflammation -Triglycerides normal at 128 so this is not the etiology -we will d/c morphine and IV pain med and monitor -Monitor LFTs and lipase again in a.m.  ? Sepsis-unlikely Spike temp overnight and has low grade temps -blood cult from 10/1 ng so far, urine cultures negative  Seizure like activty? -cannot be corroborated -Likely secondary to chills -no workup at this time   Pure hyperglyceridemia -see above -LDL 116, triglycerides 128   Anemia of chronic renal failure, stage 4 (severe) -sees Dr. Lorrene Reid -Saline as above  Anemia of renal disease -hb stable -no need replcement -consider Colonoscopy as OP  Hematuria -chronic issue -might need Cysto? 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

## 2015-08-06 NOTE — Progress Notes (Signed)
Patient with temp of 102.0 this evening. MD on call paged. Monitoring closely. Sheridan Gettel, Rande Brunt, RN

## 2015-08-07 LAB — COMPREHENSIVE METABOLIC PANEL
ALT: 24 U/L (ref 17–63)
ANION GAP: 8 (ref 5–15)
AST: 26 U/L (ref 15–41)
Albumin: 2.3 g/dL — ABNORMAL LOW (ref 3.5–5.0)
Alkaline Phosphatase: 220 U/L — ABNORMAL HIGH (ref 38–126)
BILIRUBIN TOTAL: 1.1 mg/dL (ref 0.3–1.2)
BUN: 33 mg/dL — AB (ref 6–20)
CHLORIDE: 113 mmol/L — AB (ref 101–111)
CO2: 22 mmol/L (ref 22–32)
Calcium: 8 mg/dL — ABNORMAL LOW (ref 8.9–10.3)
Creatinine, Ser: 2.34 mg/dL — ABNORMAL HIGH (ref 0.61–1.24)
GFR, EST AFRICAN AMERICAN: 29 mL/min — AB (ref >60)
GFR, EST NON AFRICAN AMERICAN: 25 mL/min — AB (ref >60)
Glucose, Bld: 97 mg/dL (ref 65–99)
POTASSIUM: 4.2 mmol/L (ref 3.5–5.1)
Sodium: 143 mmol/L (ref 135–145)
TOTAL PROTEIN: 6.3 g/dL — AB (ref 6.5–8.1)

## 2015-08-07 LAB — LIPASE, BLOOD: LIPASE: 352 U/L — AB (ref 22–51)

## 2015-08-07 MED ORDER — COLCHICINE 0.6 MG PO TABS
0.3000 mg | ORAL_TABLET | Freq: Every day | ORAL | Status: DC
  Administered 2015-08-07 – 2015-08-09 (×3): 0.3 mg via ORAL
  Filled 2015-08-07 (×3): qty 1

## 2015-08-07 NOTE — Progress Notes (Signed)
Hourly rounds patient resting and has no complaints at this time

## 2015-08-07 NOTE — Progress Notes (Addendum)
Pedro Young [id number] DOB: 1933-07-26 DOA: 08/02/2015 PCP: Michel Bickers, MD  Brief narrative: 81 y/o ?  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 Lorrene Reid  Admitted with acute pancreatitis 9/30 and continued to have low grade fevers which were cult neg  Was eventually felt patient may have a flare of gout R ankle and started on colhcicine 10/4 Past medical history-As per Problem list Chart reviewed as below- Review  Consultants:  None  Procedures:  CT  Antibiotics:  None   Subjective   Well No issues tol clear liquid diet abd pain better no n No stool or diarr Slight ankle pain this am and on walking   Objective    Interim History:   Telemetry:    Objective: Filed Vitals:   08/06/15 0455 08/06/15 1400 08/06/15 2100 08/07/15 0523  BP: 140/75 139/76 132/72 144/77  Pulse: 86 78 98 79  Temp: 100 F (37.8 C) 98.6 F (37 C) 102 F (38.9 C) 99.2 F (37.3 C)  TempSrc:  Oral Oral   Resp: 15 17 17 16   Height:      Weight:      SpO2: 95% 96% 96% 100%    Intake/Output Summary (Last 24 hours) at 08/07/15 1444 Last data filed at 08/07/15 1010  Gross per 24 hour  Intake      0 ml  Output    650 ml  Net   -650 ml    Exam:  General: EOMI NCAT Cardiovascular: S1-S2 no murmur rub or gallop Respiratory: Clinically clear Abdomen: Slightly tender in epigastrium but no other issues Skin no lower extremity edema-slighlty tender in anterioir part of ankle on R side.  No specific swelling or warmth Neuro intact and moving 4 limbs no efcit and no tremor  Data Reviewed: Basic Metabolic Panel:  Recent Labs Lab 08/02/15 1205 08/02/15 1705 08/03/15 0343 08/05/15 0506 08/06/15 0643 08/07/15 0834  NA 143  --  139 140 138 143  K 4.5  --  4.7 4.3 4.1 4.2  CL 107  --  106 108 106 113*  CO2 23  --  25 23 23 22   GLUCOSE 106*  --  106* 98 107* 97    BUN 57*  --  56* 35* 31* 33*  CREATININE 3.91* 3.83* 3.85* 2.60* 2.48* 2.34*  CALCIUM 9.1  --  7.9* 8.0* 8.2* 8.0*   Liver Function Tests:  Recent Labs Lab 08/02/15 1205 08/03/15 0343 08/05/15 0506 08/06/15 0643 08/07/15 0834  AST 29 29 21 23 26   ALT 23 25 23 24 24   ALKPHOS 124 147* 181* 220* 220*  BILITOT 1.1 1.0 1.4* 1.1 1.1  PROT 7.7 6.1* 5.6* 6.1* 6.3*  ALBUMIN 3.6 2.7* 2.3* 2.4* 2.3*    Recent Labs Lab 08/03/15 0343 08/04/15 1718 08/05/15 1755 08/06/15 0643 08/07/15 0834  LIPASE 261* 504* 880* 508* 352*   No results for input(s): AMMONIA in the last 168 hours. CBC:  Recent Labs Lab 08/02/15 1205 08/02/15 1705 08/03/15 0343 08/05/15 0506  WBC 10.1 9.8 10.3 10.2  NEUTROABS  --   --   --  6.9  HGB 12.7* 11.4* 10.6* 10.2*  HCT 38.5* 34.9* 32.1* 30.1*  MCV 95.5 94.3 94.1 92.6  PLT 160 159 154 136*   Cardiac Enzymes: No results for input(s): CKTOTAL, CKMB, CKMBINDEX, TROPONINI in the last 168 hours. BNP: Invalid input(s): POCBNP CBG: No results for input(s): GLUCAP in  the last 168 hours.  Recent Results (from the past 240 hour(s))  Culture, Urine     Status: None   Collection Time: 08/04/15  2:33 AM  Result Value Ref Range Status   Specimen Description URINE, CLEAN CATCH  Final   Special Requests NONE  Final   Culture MULTIPLE SPECIES PRESENT, SUGGEST RECOLLECTION  Final   Report Status 08/05/2015 FINAL  Final  Culture, blood (routine x 2)     Status: None (Preliminary result)   Collection Time: 08/04/15  2:37 AM  Result Value Ref Range Status   Specimen Description BLOOD LEFT ANTECUBITAL  Final   Special Requests BOTTLES DRAWN AEROBIC AND ANAEROBIC 10CC  Final   Culture NO GROWTH 3 DAYS  Final   Report Status PENDING  Incomplete  Culture, blood (routine x 2)     Status: None (Preliminary result)   Collection Time: 08/04/15  2:43 AM  Result Value Ref Range Status   Specimen Description BLOOD LEFT ARM  Final   Special Requests BOTTLES DRAWN  AEROBIC AND ANAEROBIC 10CC  Final   Culture NO GROWTH 3 DAYS  Final   Report Status PENDING  Incomplete  Culture, blood (routine x 2)     Status: None (Preliminary result)   Collection Time: 08/06/15 10:03 PM  Result Value Ref Range Status   Specimen Description BLOOD LEFT ARM  Final   Special Requests BOTTLES DRAWN AEROBIC AND ANAEROBIC 8CC  Final   Culture NO GROWTH < 12 HOURS  Final   Report Status PENDING  Incomplete  Culture, blood (routine x 2)     Status: None (Preliminary result)   Collection Time: 08/06/15 10:08 PM  Result Value Ref Range Status   Specimen Description BLOOD LEFT ANTECUBITAL  Final   Special Requests BOTTLES DRAWN AEROBIC AND ANAEROBIC 10CC  Final   Culture NO GROWTH < 12 HOURS  Final   Report Status PENDING  Incomplete     Studies:              All Imaging reviewed and is as per above notation   Scheduled Meds: . acetaminophen  650 mg Oral Once  . colchicine  0.3 mg Oral Daily  . enoxaparin (LOVENOX) injection  30 mg Subcutaneous Q24H  . mirtazapine  15 mg Oral QHS   Continuous Infusions: . sodium chloride 75 mL/hr at 08/07/15 1124     Assessment/Plan:  Principal Problem:  Acute pancreatitis -DDx Hypertg vs Idopathic vs CBD stone -IV saline 125 cc/hr-->75 cc/hr -Rpt Lipase has trended decreased but has increased aagin to 500 range and is now trending in the 300 range -CT abd showed mild increased inflammation -Triglycerides normal at 128 so this is not the etiology -we will d/c morphine and IV pain med and monitor -Monitor LFTs and lipase again in a.m. -can try to graduate diet again in am 10/5  ? Sepsis-unlikely Spike temp overnight and has low grade temps -blood cult from 10/1 ng so far, urine cultures negative  Seizure like activty? -cannot be corroborated -Likely secondary to chills -no workup at this time   Pure hyperglyceridemia -see above -LDL 116, triglycerides 128   Anemia of chronic renal failure, stage 4  (severe) -sees Dr. Lorrene Reid -Saline as above  Anemia of renal disease -hb stable -no need replcement -consider Colonoscopy as OP  Hematuria -chronic issue -might need Cysto? 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

## 2015-08-08 DIAGNOSIS — I10 Essential (primary) hypertension: Secondary | ICD-10-CM

## 2015-08-08 DIAGNOSIS — K8500 Idiopathic acute pancreatitis without necrosis or infection: Secondary | ICD-10-CM

## 2015-08-08 DIAGNOSIS — N179 Acute kidney failure, unspecified: Secondary | ICD-10-CM

## 2015-08-08 DIAGNOSIS — N184 Chronic kidney disease, stage 4 (severe): Secondary | ICD-10-CM

## 2015-08-08 DIAGNOSIS — D631 Anemia in chronic kidney disease: Secondary | ICD-10-CM

## 2015-08-08 DIAGNOSIS — M109 Gout, unspecified: Secondary | ICD-10-CM

## 2015-08-08 LAB — CBC
HCT: 29.6 % — ABNORMAL LOW (ref 39.0–52.0)
HEMOGLOBIN: 9.8 g/dL — AB (ref 13.0–17.0)
MCH: 30.4 pg (ref 26.0–34.0)
MCHC: 33.1 g/dL (ref 30.0–36.0)
MCV: 91.9 fL (ref 78.0–100.0)
PLATELETS: 201 10*3/uL (ref 150–400)
RBC: 3.22 MIL/uL — AB (ref 4.22–5.81)
RDW: 13.3 % (ref 11.5–15.5)
WBC: 15.2 10*3/uL — AB (ref 4.0–10.5)

## 2015-08-08 LAB — COMPREHENSIVE METABOLIC PANEL
ALBUMIN: 2 g/dL — AB (ref 3.5–5.0)
ALK PHOS: 218 U/L — AB (ref 38–126)
ALT: 23 U/L (ref 17–63)
ANION GAP: 11 (ref 5–15)
AST: 27 U/L (ref 15–41)
BUN: 28 mg/dL — ABNORMAL HIGH (ref 6–20)
CALCIUM: 7.6 mg/dL — AB (ref 8.9–10.3)
CO2: 21 mmol/L — AB (ref 22–32)
Chloride: 107 mmol/L (ref 101–111)
Creatinine, Ser: 2.32 mg/dL — ABNORMAL HIGH (ref 0.61–1.24)
GFR calc non Af Amer: 25 mL/min — ABNORMAL LOW (ref >60)
GFR, EST AFRICAN AMERICAN: 29 mL/min — AB (ref >60)
GLUCOSE: 103 mg/dL — AB (ref 65–99)
POTASSIUM: 4 mmol/L (ref 3.5–5.1)
SODIUM: 139 mmol/L (ref 135–145)
TOTAL PROTEIN: 5.2 g/dL — AB (ref 6.5–8.1)
Total Bilirubin: 0.7 mg/dL (ref 0.3–1.2)

## 2015-08-08 LAB — LIPASE, BLOOD: Lipase: 341 U/L — ABNORMAL HIGH (ref 22–51)

## 2015-08-08 MED ORDER — AMLODIPINE BESYLATE 5 MG PO TABS
5.0000 mg | ORAL_TABLET | Freq: Every day | ORAL | Status: DC
  Administered 2015-08-08 – 2015-08-09 (×2): 5 mg via ORAL
  Filled 2015-08-08 (×2): qty 1

## 2015-08-08 MED ORDER — PREDNISONE 50 MG PO TABS
60.0000 mg | ORAL_TABLET | Freq: Every day | ORAL | Status: DC
  Administered 2015-08-08 – 2015-08-09 (×2): 60 mg via ORAL
  Filled 2015-08-08 (×4): qty 1

## 2015-08-08 NOTE — Progress Notes (Signed)
PROGRESS NOTE  Pedro Young [id number] DOB: 08/05/1933 DOA: 08/02/2015 PCP: Michel Bickers, MD  Assessment/Plan: Acute pancreatitis -CT abd showed mild increased inflammation -Triglycerides normal at 128 so this is not the etiology -no pain in abd -eating well -advance diet  fever Spike temp and has low grade temps-- ? gout -blood cult from 10/1 ng so far, urine cultures negative  Seizure like activty? -cannot be corroborated -Likely secondary to chills -no workup at this time   Pure hyperglyceridemia -see above -LDL 116, triglycerides 128   Anemia of chronic renal failure, stage 4 (severe) -sees Dr. Lorrene Reid  Anemia of renal disease -hb stable -no need replcement -consider Colonoscopy as OP  Hematuria -chronic issue -might need Cysto? Only a smoker for 3 yrs in youth   HYPERTENSION, BENIGN ESSENTIAL -norvasc at d/c?   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 -creat stable with GFR ~ 29  Possible gout -steroid taper colchicine started by Dr. Verlon Au  Code Status: DNR Family Communication: daughter at bedside Disposition Plan: home once gout improved   Consultants:    Procedures:      HPI/Subjective: Pain in right ankle  Objective: Filed Vitals:   08/08/15 0541  BP: 128/99  Pulse: 94  Temp: 99 F (37.2 C)  Resp: 16    Intake/Output Summary (Last 24 hours) at 08/08/15 1223 Last data filed at 08/08/15 1146  Gross per 24 hour  Intake   2100 ml  Output   1700 ml  Net    400 ml   Filed Weights   08/05/15 1300  Weight: 64.18 kg (141 lb 7.9 oz)    Exam:   General:  Awake, pleasant  Cardiovascular: rrr  Respiratory: clear  Abdomen: +BS, soft  Musculoskeletal: no edema in LE   Data Reviewed: Basic Metabolic Panel:  Recent Labs Lab 08/03/15 0343 08/05/15 0506 08/06/15 0643 08/07/15 0834  08/08/15 0528  NA 139 140 138 143 139  K 4.7 4.3 4.1 4.2 4.0  CL 106 108 106 113* 107  CO2 25 23 23 22  21*  GLUCOSE 106* 98 107* 97 103*  BUN 56* 35* 31* 33* 28*  CREATININE 3.85* 2.60* 2.48* 2.34* 2.32*  CALCIUM 7.9* 8.0* 8.2* 8.0* 7.6*   Liver Function Tests:  Recent Labs Lab 08/03/15 0343 08/05/15 0506 08/06/15 0643 08/07/15 0834 08/08/15 0528  AST 29 21 23 26 27   ALT 25 23 24 24 23   ALKPHOS 147* 181* 220* 220* 218*  BILITOT 1.0 1.4* 1.1 1.1 0.7  PROT 6.1* 5.6* 6.1* 6.3* 5.2*  ALBUMIN 2.7* 2.3* 2.4* 2.3* 2.0*    Recent Labs Lab 08/04/15 1718 08/05/15 1755 08/06/15 0643 08/07/15 0834 08/08/15 0528  LIPASE 504* 880* 508* 352* 341*   No results for input(s): AMMONIA in the last 168 hours. CBC:  Recent Labs Lab 08/02/15 1205 08/02/15 1705 08/03/15 0343 08/05/15 0506 08/08/15 0858  WBC 10.1 9.8 10.3 10.2 15.2*  NEUTROABS  --   --   --  6.9  --   HGB 12.7* 11.4* 10.6* 10.2* 9.8*  HCT 38.5* 34.9* 32.1* 30.1* 29.6*  MCV 95.5 94.3 94.1 92.6 91.9  PLT 160 159 154 136* 201   Cardiac Enzymes: No results for input(s): CKTOTAL, CKMB, CKMBINDEX, TROPONINI in the last 168 hours. BNP (last 3 results) No results for input(s): BNP in the last 8760 hours.  ProBNP (last 3 results) No results  for input(s): PROBNP in the last 8760 hours.  CBG: No results for input(s): GLUCAP in the last 168 hours.  Recent Results (from the past 240 hour(s))  Culture, Urine     Status: None   Collection Time: 08/04/15  2:33 AM  Result Value Ref Range Status   Specimen Description URINE, CLEAN CATCH  Final   Special Requests NONE  Final   Culture MULTIPLE SPECIES PRESENT, SUGGEST RECOLLECTION  Final   Report Status 08/05/2015 FINAL  Final  Culture, blood (routine x 2)     Status: None (Preliminary result)   Collection Time: 08/04/15  2:37 AM  Result Value Ref Range Status   Specimen Description BLOOD LEFT ANTECUBITAL  Final   Special Requests BOTTLES DRAWN AEROBIC AND ANAEROBIC  10CC  Final   Culture NO GROWTH 4 DAYS  Final   Report Status PENDING  Incomplete  Culture, blood (routine x 2)     Status: None (Preliminary result)   Collection Time: 08/04/15  2:43 AM  Result Value Ref Range Status   Specimen Description BLOOD LEFT ARM  Final   Special Requests BOTTLES DRAWN AEROBIC AND ANAEROBIC 10CC  Final   Culture NO GROWTH 4 DAYS  Final   Report Status PENDING  Incomplete  Culture, blood (routine x 2)     Status: None (Preliminary result)   Collection Time: 08/06/15 10:03 PM  Result Value Ref Range Status   Specimen Description BLOOD LEFT ARM  Final   Special Requests BOTTLES DRAWN AEROBIC AND ANAEROBIC 8CC  Final   Culture NO GROWTH 2 DAYS  Final   Report Status PENDING  Incomplete  Culture, blood (routine x 2)     Status: None (Preliminary result)   Collection Time: 08/06/15 10:08 PM  Result Value Ref Range Status   Specimen Description BLOOD LEFT ANTECUBITAL  Final   Special Requests BOTTLES DRAWN AEROBIC AND ANAEROBIC 10CC  Final   Culture NO GROWTH 2 DAYS  Final   Report Status PENDING  Incomplete     Studies: No results found.  Scheduled Meds: . acetaminophen  650 mg Oral Once  . colchicine  0.3 mg Oral Daily  . enoxaparin (LOVENOX) injection  30 mg Subcutaneous Q24H  . mirtazapine  15 mg Oral QHS  . predniSONE  60 mg Oral Q breakfast   Continuous Infusions: . 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

## 2015-08-09 DIAGNOSIS — M10071 Idiopathic gout, right ankle and foot: Secondary | ICD-10-CM

## 2015-08-09 LAB — CREATININE, SERUM
Creatinine, Ser: 2.03 mg/dL — ABNORMAL HIGH (ref 0.61–1.24)
GFR calc Af Amer: 34 mL/min — ABNORMAL LOW (ref >60)
GFR, EST NON AFRICAN AMERICAN: 29 mL/min — AB (ref >60)

## 2015-08-09 LAB — CULTURE, BLOOD (ROUTINE X 2)
Culture: NO GROWTH
Culture: NO GROWTH

## 2015-08-09 LAB — LIPASE, BLOOD: Lipase: 189 U/L — ABNORMAL HIGH (ref 22–51)

## 2015-08-09 LAB — BASIC METABOLIC PANEL
Anion gap: 8 (ref 5–15)
BUN: 32 mg/dL — AB (ref 6–20)
CALCIUM: 7.4 mg/dL — AB (ref 8.9–10.3)
CO2: 20 mmol/L — AB (ref 22–32)
Chloride: 108 mmol/L (ref 101–111)
Creatinine, Ser: 2.02 mg/dL — ABNORMAL HIGH (ref 0.61–1.24)
GFR calc Af Amer: 34 mL/min — ABNORMAL LOW (ref >60)
GFR, EST NON AFRICAN AMERICAN: 29 mL/min — AB (ref >60)
GLUCOSE: 225 mg/dL — AB (ref 65–99)
POTASSIUM: 4.1 mmol/L (ref 3.5–5.1)
Sodium: 136 mmol/L (ref 135–145)

## 2015-08-09 LAB — CBC
HEMATOCRIT: 27.1 % — AB (ref 39.0–52.0)
Hemoglobin: 9 g/dL — ABNORMAL LOW (ref 13.0–17.0)
MCH: 30.3 pg (ref 26.0–34.0)
MCHC: 33.2 g/dL (ref 30.0–36.0)
MCV: 91.2 fL (ref 78.0–100.0)
Platelets: 220 10*3/uL (ref 150–400)
RBC: 2.97 MIL/uL — ABNORMAL LOW (ref 4.22–5.81)
RDW: 13.3 % (ref 11.5–15.5)
WBC: 16 10*3/uL — ABNORMAL HIGH (ref 4.0–10.5)

## 2015-08-09 MED ORDER — AMLODIPINE BESYLATE 5 MG PO TABS
5.0000 mg | ORAL_TABLET | Freq: Every day | ORAL | Status: DC
Start: 2015-08-09 — End: 2019-03-29

## 2015-08-09 MED ORDER — PREDNISONE 10 MG PO TABS: ORAL_TABLET | ORAL | Status: DC

## 2015-08-09 NOTE — Discharge Summary (Addendum)
Physician Discharge Summary  Pedro Young [id number] DOB: 02-05-1933 DOA: 08/02/2015  PCP: Cathlean Cower, MD  Admit date: 08/02/2015 Discharge date: 08/09/2015  Time spent: 35 minutes  Recommendations for Outpatient Follow-up:  1. Consider outpatient cysto 2. Steroid taper for gout 3. Cbc, bmp 1 week  Discharge Diagnoses:  Principal Problem:   Acute pancreatitis Active Problems:   Pure hyperglyceridemia   Anemia of chronic renal failure, stage 4 (severe) (HCC)   HYPERTENSION, BENIGN ESSENTIAL   Liver abscess via hepatic artery 2011, 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   AKI (acute kidney injury) ([place])   Gout   Discharge Condition: improved  Diet recommendation: cardiac  Filed Weights   08/05/15 1300  Weight: 64.18 kg (141 lb 7.9 oz)    History of present illness:  81 y/o ?  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 Lorrene Reid  Had some Chinese food which tasted a little bit off 07/28/15 Proceeded to have abdominal pain associated with nausea and anorexia 1 episode of vomiting 1 episode of diarrhea No further other instances of nausea nor vomiting No recent medication changes nor other illnesses No dark stool no tarry stool + Chills + fever overnight 08/01/15 Urine is always dark according to patient  Denies chest pain, cough, cold, rash, icterus, falls, Weakness, blurred vision, double vision, other complaints  Because of severe increasing pain in the abdomen 8/10 relieved by laying still, worsened by moving around and walking, patient came to the emergency room. Workup revealed   Hospital Course:  Acute pancreatitis -CT abd showed mild increased inflammation -Triglycerides normal at 128 so this is not the etiology -no pain in abd -eating well -advance diet  Fever- improved Spike temp and has low grade temps-- ? gout -blood cult from 10/1 ng, urine  cultures negative  Seizure like activty? -cannot be corroborated -Likely secondary to chills -no workup at this time   Pure hyperglyceridemia -see above -LDL 116, triglycerides 128   Anemia of chronic renal failure, stage 4 (severe) -sees Dr. Lorrene Reid  Anemia of renal disease -hb stable -no need replcement -consider Colonoscopy as OP  Hematuria -chronic issue -might need Cysto? Only a smoker for 3 yrs in youth   HYPERTENSION, BENIGN ESSENTIAL -norvasc at d/c- avoid ACE   Liver abscess via hepatic artery 2011, 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   AKI (acute kidney injury) -aki 2/2 to vol depletion and ATN -baseline 2.5-3.5? -creat stable with GFR ~ 29  Possible gout -steroid taper    Procedures:    Consultations:    Discharge Exam: Filed Vitals:   08/09/15 1017  BP: 163/81  Pulse: 95  Temp: 97.7 F (36.5 C)  Resp: 18    General: awake, feeling much better   Discharge Instructions   Discharge Instructions    Diet - low sodium heart healthy    Complete by:  As directed      Discharge instructions    Complete by:  As directed   Get labs at your PCP in 1 week     Increase activity slowly    Complete by:  As directed           Discharge Medication List as of 08/09/2015 10:39 AM    START taking these medications   Details  acetaminophen (TYLENOL) 325 MG tablet Take 2 tablets (650 mg total) by mouth once., Starting 08/05/2015, OTC  amLODipine (NORVASC) 5 MG tablet Take 1 tablet (5 mg total) by mouth daily., Starting 08/09/2015, Until Discontinued, Print    oxyCODONE-acetaminophen (PERCOCET/ROXICET) 5-325 MG tablet Take 1 tablet by mouth every 4 (four) hours as needed for moderate pain., Starting 08/05/2015, Until Discontinued, Print    predniSONE (DELTASONE) 10 MG tablet 50 mg x 2 day, 40 mg x 2 day, 30 mg x 2 days, 20 mg x 2 days, 10mg  x 2 days, then d/c, Print      CONTINUE these medications which have NOT CHANGED   Details   mirtazapine (REMERON) 15 MG tablet Take 15 mg by mouth at bedtime.  , Until Discontinued, Historical Med      STOP taking these medications     furosemide (LASIX) 40 MG tablet      lisinopril (PRINIVIL,ZESTRIL) 20 MG tablet        Allergies  Allergen Reactions  . Amoxicillin-Pot Clavulanate   . Atorvastatin     REACTION: arthralgia  . Lovastatin     REACTION: arthralgia   Follow-up Information    Please follow up.   Why:  PCP 1 week       The results of significant diagnostics from this hospitalization (including imaging, microbiology, ancillary and laboratory) are listed below for reference.    Significant Diagnostic Studies: Ct Abdomen Pelvis Wo Contrast  08/05/2015   CLINICAL DATA:  Left abdominal pain. Admitted with acute pancreatitis. History of liver abscess requiring drainage. History of cholecystectomy and choledocholithiasis. History of chronic kidney disease.  EXAM: CT ABDOMEN AND PELVIS WITHOUT CONTRAST  TECHNIQUE: Multidetector CT imaging of the abdomen and pelvis was performed following the standard protocol without IV contrast.  COMPARISON:  08/02/2015 CT abdomen/ pelvis.  FINDINGS: Lower chest: Trace bilateral pleural effusions with calcified pleural plaques at both lung bases, not appreciably changed. Mild passive atelectasis in the dependent lower lobes. Re- demonstration of left anterior descending and right coronary artery calcifications.  Hepatobiliary: Normal liver with no liver mass. Status post cholecystectomy. Stable pneumobilia throughout the intrahepatic bile ducts and common bile duct.  Pancreas: Persistent peripancreatic fat stranding and ill-defined fluid surrounding the distal body and entire tail of the pancreas, in keeping with acute pancreatitis, mildly increased in the interval. No focal pancreatic or peripancreatic fluid collection. No obvious pancreatic mass or duct dilation on this noncontrast study.  Spleen: Normal size. No mass.  Adrenals/Urinary  Tract: Normal adrenals. No hydronephrosis. Simple right renal cysts, largest 2.5 cm in the posterior lower right kidney. At least 4 subcentimeter hyperdense lesions in the right kidney, largest 0.8 cm in the right upper kidney, too small to characterize, unchanged. Normal bladder.  Stomach/Bowel: Grossly normal stomach. Normal caliber small bowel with no small bowel wall thickening. The appendix is not discretely visualized. Severe sigmoid diverticulosis, with no definite large bowel wall thickening or pericolonic fat stranding.  Vascular/Lymphatic: Atherosclerotic nonaneurysmal abdominal aorta. No pathologically enlarged lymph nodes in the abdomen or pelvis.  Reproductive: Stable mild prostatomegaly.  Other: No pneumoperitoneum, ascites or focal fluid collection.  Musculoskeletal: No aggressive appearing focal osseous lesions. Moderate degenerative changes in the visualized thoracolumbar spine.  IMPRESSION: 1. Mild interval progression of inflammatory changes from acute pancreatitis in the pancreatic tail. No focal peripancreatic fluid collection. 2. Status postcholecystectomy. Pneumobilia, likely from prior sphincterotomy. 3. 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 4. Atherosclerosis, including at least two-vessel coronary artery disease. 5. Marked sigmoid diverticulosis.   Electronically Signed  By: Ilona Sorrel M.D.   On: 08/05/2015 21:34   Ct Abdomen Pelvis Wo Contrast  08/02/2015   CLINICAL DATA:  81 year old male with abdominal pain and flank pain for the past 4 days with some associated nausea, vomiting and chills.  EXAM: CT ABDOMEN AND PELVIS WITHOUT CONTRAST  TECHNIQUE: Multidetector CT imaging of the abdomen and pelvis was performed following the standard protocol without IV contrast.  COMPARISON:  CT the abdomen and pelvis 05/26/2014.  FINDINGS: Lower chest: Calcified pleural plaques and the lower thorax  bilaterally. Areas of scarring in the lower lobes of the lungs bilaterally. Atherosclerotic calcifications in the left main, left anterior descending, left circumflex and right coronary arteries.  Hepatobiliary: Pneumobilia. Status post cholecystectomy. No discrete cystic or solid hepatic lesions identified on today's noncontrast CT examination.  Pancreas: No discrete pancreatic mass. Inflammatory changes adjacent to the distal body and tail of the pancreas.  Spleen: Unremarkable.  Adrenals/Urinary Tract: Small calcification in the left renal hilum appears to be vascular. Bilateral kidneys are mildly atrophic. Several low-attenuation lesions in the right kidney are incompletely characterized on today's noncontrast CT examination, but favored to represent cysts. In addition, there are some intermediate to high attenuation lesions in the right kidney, largest which measures 8 mm in the upper pole, also incompletely characterized, but are favored to represent proteinaceous/hemorrhagic cysts. No hydroureteronephrosis. Urinary bladder is normal in appearance.  Stomach/Bowel: The stomach is decompressed, but otherwise unremarkable in appearance. No pathologic dilatation of small bowel or colon. Numerous colonic diverticulae are noted, particularly in the sigmoid colon, without surrounding inflammatory changes to suggest an acute diverticulitis at this time.  Vascular/Lymphatic: Extensive atherosclerotic calcifications throughout the abdominal and pelvic vasculature, without evidence of aneurysm. No lymphadenopathy noted in the abdomen or pelvis on today's noncontrast CT examination.  Reproductive: Prostate gland and seminal vesicles are unremarkable in appearance.  Other: No significant volume of ascites.  No pneumoperitoneum.  Musculoskeletal: There are no aggressive appearing lytic or blastic lesions noted in the visualized portions of the skeleton.  IMPRESSION: 1. Inflammatory changes adjacent to the distal body and  tail of the pancreas, concerning for acute pancreatitis. No definite pancreatic pseudocyst identified at this time. 2. Pneumobilia, presumably from prior sphincterotomy. 3. Colonic diverticulosis without evidence of acute diverticulitis at this time. 4. Status post cholecystectomy. 5. Extensive atherosclerosis, including left main and 3 vessel coronary artery disease. 6. Calcified pleural plaques in the lower thorax bilaterally, suggestive of asbestos related pleural disease.   Electronically Signed   By: Vinnie Langton M.D.   On: 08/02/2015 16:13   Dg Chest Port 1 View  08/04/2015   CLINICAL DATA:  Fever  EXAM: PORTABLE CHEST 1 VIEW  COMPARISON:  07/18/2011  FINDINGS: Indistinct bibasilar opacities correlates with calcified pleural plaques and atelectasis based on abdominal CT from yesterday. There is no edema, consolidation, effusion, or pneumothorax. Normal heart size and aortic contours. No acute osseous finding.  IMPRESSION: 1. No acute finding. 2. Calcified pleural plaques and bibasilar atelectasis.   Electronically Signed   By: Monte Fantasia M.D.   On: 08/04/2015 01:16    Microbiology: Recent Results (from the past 240 hour(s))  Culture, Urine     Status: None   Collection Time: 08/04/15  2:33 AM  Result Value Ref Range Status   Specimen Description URINE, CLEAN CATCH  Final   Special Requests NONE  Final   Culture MULTIPLE SPECIES PRESENT, SUGGEST RECOLLECTION  Final   Report Status 08/05/2015 FINAL  Final  Culture,  blood (routine x 2)     Status: None   Collection Time: 08/04/15  2:37 AM  Result Value Ref Range Status   Specimen Description BLOOD LEFT ANTECUBITAL  Final   Special Requests BOTTLES DRAWN AEROBIC AND ANAEROBIC 10CC  Final   Culture NO GROWTH 5 DAYS  Final   Report Status 08/09/2015 FINAL  Final  Culture, blood (routine x 2)     Status: None   Collection Time: 08/04/15  2:43 AM  Result Value Ref Range Status   Specimen Description BLOOD LEFT ARM  Final   Special  Requests BOTTLES DRAWN AEROBIC AND ANAEROBIC 10CC  Final   Culture NO GROWTH 5 DAYS  Final   Report Status 08/09/2015 FINAL  Final  Culture, blood (routine x 2)     Status: None (Preliminary result)   Collection Time: 08/06/15 10:03 PM  Result Value Ref Range Status   Specimen Description BLOOD LEFT ARM  Final   Special Requests BOTTLES DRAWN AEROBIC AND ANAEROBIC 8CC  Final   Culture NO GROWTH 3 DAYS  Final   Report Status PENDING  Incomplete  Culture, blood (routine x 2)     Status: None (Preliminary result)   Collection Time: 08/06/15 10:08 PM  Result Value Ref Range Status   Specimen Description BLOOD LEFT ANTECUBITAL  Final   Special Requests BOTTLES DRAWN AEROBIC AND ANAEROBIC 10CC  Final   Culture NO GROWTH 3 DAYS  Final   Report Status PENDING  Incomplete     Labs: Basic Metabolic Panel:  Recent Labs Lab 08/05/15 0506 08/06/15 0643 08/07/15 0834 08/08/15 0528 08/09/15 0021 08/09/15 0535  NA 140 138 143 139 136  --   K 4.3 4.1 4.2 4.0 4.1  --   CL 108 106 113* 107 108  --   CO2 23 23 22  21* 20*  --   GLUCOSE 98 107* 97 103* 225*  --   BUN 35* 31* 33* 28* 32*  --   CREATININE 2.60* 2.48* 2.34* 2.32* 2.02* 2.03*  CALCIUM 8.0* 8.2* 8.0* 7.6* 7.4*  --    Liver Function Tests:  Recent Labs Lab 08/03/15 0343 08/05/15 0506 08/06/15 0643 08/07/15 0834 08/08/15 0528  AST 29 21 23 26 27   ALT 25 23 24 24 23   ALKPHOS 147* 181* 220* 220* 218*  BILITOT 1.0 1.4* 1.1 1.1 0.7  PROT 6.1* 5.6* 6.1* 6.3* 5.2*  ALBUMIN 2.7* 2.3* 2.4* 2.3* 2.0*    Recent Labs Lab 08/05/15 1755 08/06/15 0643 08/07/15 0834 08/08/15 0528 08/09/15 0535  LIPASE 880* 508* 352* 341* 189*   No results for input(s): AMMONIA in the last 168 hours. CBC:  Recent Labs Lab 08/02/15 1705 08/03/15 0343 08/05/15 0506 08/08/15 0858 08/09/15 0021  WBC 9.8 10.3 10.2 15.2* 16.0*  NEUTROABS  --   --  6.9  --   --   HGB 11.4* 10.6* 10.2* 9.8* 9.0*  HCT 34.9* 32.1* 30.1* 29.6* 27.1*  MCV 94.3  94.1 92.6 91.9 91.2  PLT 159 154 136* 201 220   Cardiac Enzymes: No results for input(s): CKTOTAL, CKMB, CKMBINDEX, TROPONINI in the last 168 hours. BNP: BNP (last 3 results) No results for input(s): BNP in the last 8760 hours.  ProBNP (last 3 results) No results for input(s): PROBNP in the last 8760 hours.  CBG: No results for input(s): GLUCAP in the last 168 hours.     SignedEulogio Bear  [facility] 08/09/2015, 1:40 PM

## 2015-08-09 NOTE — Progress Notes (Signed)
Inpatient Diabetes Program Recommendations  AACE/ADA: New Consensus Statement on Inpatient Glycemic Control (2015)  Target Ranges:  Prepandial:   less than 140 mg/dL      Peak postprandial:   less than 180 mg/dL (1-2 hours)      Critically ill patients:  140 - 180 mg/dL  Results for NATHIN, BETKER (MRN [id number]) as of 08/09/2015 10:13  Ref. Range 08/05/2015 05:06 08/06/2015 06:43 08/07/2015 08:34 08/08/2015 05:28 08/09/2015 00:21  Glucose Latest Ref Range: 65-99 mg/dL 98 107 (H) 97 103 (H) 225 (H)   Review of Glycemic Control  Diabetes history: No Outpatient Diabetes medications: NA Current orders for Inpatient glycemic control: None  Inpatient Diabetes Program Recommendations: Correction (SSI): Lab glucose 225 mg/dl today at 00:21. Hyperlgycemia likely due to steroids. May want to consider ordering CBGs with Novolog correction scale ACHS.  Thanks, Barnie Alderman, RN, MSN, CCRN, CDE Diabetes Coordinator Inpatient Diabetes Program [phone] (Team Pager from [place] to [place]) [phone] ([facility] office) [phone] [facility] office) [phone] [facility] office)

## 2015-08-09 NOTE — Care Management Note (Addendum)
Case Management Note  Patient Details  Name: Pedro Young MRN: [id number] Date of Birth: 27-Jun-1933  Subjective/Objective:                    Action/Plan:  Patient's PCP is DR Kristie Cowman at [facility] phone [phone] , called admitting to update face sheet  Expected Discharge Date:                  Expected Discharge Plan:  Home/Self Care  In-House Referral:     Discharge planning Services     Post Acute Care Choice:    Choice offered to:  Patient, Adult Children  DME Arranged:    DME Agency:     HH Arranged:    [place] Agency:     Status of Service:  In process, will continue to follow  Medicare Important Message Given:  Yes-second notification given Date Medicare IM Given:    Medicare IM give by:    Date Additional Medicare IM Given:    Additional Medicare Important Message give by:     If discussed at [place] of Stay Meetings, dates discussed:    Additional Comments:  Marilu Favre, RN 08/09/2015, 9:47 AM

## 2015-08-09 NOTE — Progress Notes (Signed)
Pt discharged to home.  Discharge instructions explained to pt.  Pt has no questions at the time of discharge.  Pt states he has all belongings.  IV removed.  Pt taken off unit by volunteer services.

## 2015-08-09 NOTE — Discharge Instructions (Signed)

## 2015-08-09 NOTE — Care Management Important Message (Signed)
Important Message  Patient Details  Name: Pedro Young MRN: [id number] Date of Birth: 03-08-1933   Medicare Important Message Given:  Yes-third notification given    Delorse Lek 08/09/2015, 1:20 PM

## 2015-08-11 LAB — CULTURE, BLOOD (ROUTINE X 2)
Culture: NO GROWTH
Culture: NO GROWTH

## 2017-02-05 ENCOUNTER — Other Ambulatory Visit: Payer: Self-pay | Admitting: *Deleted

## 2017-02-05 DIAGNOSIS — N185 Chronic kidney disease, stage 5: Secondary | ICD-10-CM

## 2017-02-05 DIAGNOSIS — Z01810 Encounter for preprocedural cardiovascular examination: Secondary | ICD-10-CM

## 2017-03-16 ENCOUNTER — Encounter: Payer: Self-pay | Admitting: Vascular Surgery

## 2017-03-18 ENCOUNTER — Encounter: Payer: Self-pay | Admitting: Vascular Surgery

## 2017-03-18 ENCOUNTER — Ambulatory Visit (INDEPENDENT_AMBULATORY_CARE_PROVIDER_SITE_OTHER): Payer: Medicare HMO | Admitting: Vascular Surgery

## 2017-03-18 ENCOUNTER — Ambulatory Visit (HOSPITAL_COMMUNITY)
Admission: RE | Admit: 2017-03-18 | Discharge: 2017-03-18 | Disposition: A | Discharge status: Home / Self Care | Payer: Medicare HMO | Source: Ambulatory Visit | Attending: Vascular Surgery | Admitting: Vascular Surgery

## 2017-03-18 ENCOUNTER — Ambulatory Visit (INDEPENDENT_AMBULATORY_CARE_PROVIDER_SITE_OTHER)
Admission: RE | Admit: 2017-03-18 | Discharge: 2017-03-18 | Disposition: A | Discharge status: Home / Self Care | Payer: Medicare HMO | Source: Ambulatory Visit | Attending: Vascular Surgery | Admitting: Vascular Surgery

## 2017-03-18 VITALS — BP 138/77 | HR 56 | Temp 97.0°F | Resp 20 | Ht 65.0 in | Wt 133.6 lb

## 2017-03-18 DIAGNOSIS — N185 Chronic kidney disease, stage 5: Secondary | ICD-10-CM | POA: Diagnosis not present

## 2017-03-18 DIAGNOSIS — Z01810 Encounter for preprocedural cardiovascular examination: Secondary | ICD-10-CM

## 2017-03-18 DIAGNOSIS — N184 Chronic kidney disease, stage 4 (severe): Secondary | ICD-10-CM

## 2017-03-18 NOTE — Progress Notes (Signed)
[facility] [place]  Patient name: Pedro Young MRN: [id number] DOB: 1933-06-24 Sex: male  REASON FOR CONSULT: Discussed access for hemodialysis  HPI: Pedro Young is a 83 y.o. male, who is here today for discussion of hemodialysis access. He is here today with his daughter. He is very pleasant gentleman whose had a long history of chronic kidney disease. Most recent creatinine was 5 with a GFR estimated at 23. He is seen today for discussion of access. Request is been made to place a fistula if appropriate and wait for AV graft if is not felt to be a fistula candidate. He has never had hemodialysis. Aside from his renal insufficiency he has been relatively healthy with no cardiac disease. He remains active.  Past Medical History:  Diagnosis Date  . Renal disorder     Family History  Problem Relation Age of Onset  . Liver disease Father   . Kidney disease Father   . Heart disease Father        before age 45  . Heart disease Mother        before age 68    SOCIAL HISTORY: Social History   Social History  . Marital status: Married    Spouse name: N/A  . Number of children: N/A  . Years of education: N/A   Occupational History  . Not on file.   Social History Main Topics  . Smoking status: Former Research scientist (life sciences)  . Smokeless tobacco: Former Systems developer    Quit date: 11/03/1954  . Alcohol use No  . Drug use: No  . Sexual activity: Not on file   Other Topics Concern  . Not on file   Social History Narrative   From [place]   Moved to [place] to [place] 2006   5 daughters       Allergies  Allergen Reactions  . Amoxicillin-Pot Clavulanate   . Atorvastatin     REACTION: arthralgia  . Lovastatin     REACTION: arthralgia    Current Outpatient Prescriptions  Medication Sig Dispense Refill  . acetaminophen (TYLENOL) 325 MG tablet Take 2 tablets (650 mg total) by mouth once.    Marland Kitchen amLODipine (NORVASC) 5 MG  tablet Take 1 tablet (5 mg total) by mouth daily. 30 tablet 0  . mirtazapine (REMERON) 15 MG tablet Take 15 mg by mouth at bedtime.      Marland Kitchen oxyCODONE-acetaminophen (PERCOCET/ROXICET) 5-325 MG tablet Take 1 tablet by mouth every 4 (four) hours as needed for moderate pain. 30 tablet 0  . predniSONE (DELTASONE) 10 MG tablet 50 mg x 2 day, 40 mg x 2 day, 30 mg x 2 days, 20 mg x 2 days, 10mg  x 2 days, then d/c 30 tablet 0   No current facility-administered medications for this visit.     REVIEW OF SYSTEMS:  [X]  denotes positive finding, [ ]  denotes negative finding Cardiac  Comments:  Chest pain or chest pressure:    Shortness of breath upon exertion:    Short of breath when lying flat:    Irregular heart rhythm:        Vascular    Pain in calf, thigh, or hip brought on by ambulation:    Pain in feet at night that wakes you up from your sleep:  x   Blood clot in your veins:    Leg swelling:  x       Pulmonary    Oxygen at home:  Productive cough:     Wheezing:         Neurologic    Sudden weakness in arms or legs:     Sudden numbness in arms or legs:     Sudden onset of difficulty speaking or slurred speech:    Temporary loss of vision in one eye:     Problems with dizziness:         Gastrointestinal    Blood in stool:     Vomited blood:         Genitourinary    Burning when urinating:     Blood in urine: x       Psychiatric    Major depression:         Hematologic    Bleeding problems:    Problems with blood clotting too easily:        Skin    Rashes or ulcers:        Constitutional    Fever or chills:      PHYSICAL EXAM: Vitals:   03/18/17 0905 03/18/17 0906  BP: (!) 144/75 138/77  Pulse: (!) 56   Resp: 20   Temp: 97 F (36.1 C)   TempSrc: Oral   SpO2: 99%   Weight: 133 lb 9.6 oz (60.6 kg)   Height: 5\' 5"  (1.651 m)     GENERAL: The patient is a well-nourished male, in no acute distress. The vital signs are documented above. CARDIOVASCULAR: 2+  radial 2+ ulnar and 2+ dorsalis pedis pulses bilaterally area very small surface veins bilaterally PULMONARY: There is good air exchange  ABDOMEN: Soft and non-tender  MUSCULOSKELETAL: There are no major deformities or cyanosis. NEUROLOGIC: No focal weakness or paresthesias are detected. SKIN: There are no ulcers or rashes noted. PSYCHIATRIC: The patient has a normal affect.  DATA:  Upper tremor the arterial studies reveal normal triphasic brachial, radial and ulnar waveforms. Vein mapping shows extremely small cephalic and basilic vein less than 2 mm throughout their course.  I imaged his veins with SonoSite ultrasound is extremely small surface veins the cephalic and basilic bilaterally.  MEDICAL ISSUES: Very long discussion with the patient and his daughter present. Explained options for hemodialysis to include tunnel catheter, AV fistula and AV graft. He is not a candidate for fistula attempt due to extremely small surface veins bilaterally. He is right handed. I would recommend a left arm AV graft when he approaches a need for hemodialysis. Understands that the takes proximally 3-4 weeks for healing after Gore-Tex graft placement. We will not see him routinely. We will see him again if he approaches more urgent need for hemodialysis access.   Rosetta Posner, MD FACS [facility] Tel [phone] Pager [phone]

## 2017-08-21 ENCOUNTER — Emergency Department (HOSPITAL_COMMUNITY)
Admission: EM | Admit: 2017-08-21 | Discharge: 2017-08-21 | Disposition: A | Discharge status: Home / Self Care | Payer: Medicare HMO | Attending: Emergency Medicine | Admitting: Emergency Medicine

## 2017-08-21 ENCOUNTER — Encounter (HOSPITAL_COMMUNITY): Payer: Self-pay | Admitting: Emergency Medicine

## 2017-08-21 DIAGNOSIS — M549 Dorsalgia, unspecified: Secondary | ICD-10-CM | POA: Diagnosis present

## 2017-08-21 DIAGNOSIS — Z5321 Procedure and treatment not carried out due to patient leaving prior to being seen by health care provider: Secondary | ICD-10-CM | POA: Insufficient documentation

## 2017-08-21 NOTE — ED Triage Notes (Signed)
Pt was seen by kidney MD yesterday due to potassium was high. Was started on liquid drink then to have repeat labs done this am. Pt started to have lower back pain which increases with deep breath and movement. Pt denies abd pain. Pt had BM yesterday. Denies any urination concerns.

## 2017-08-21 NOTE — ED Notes (Signed)
Pt observed lwbs

## 2018-10-05 ENCOUNTER — Encounter (HOSPITAL_COMMUNITY): Payer: Self-pay

## 2018-10-05 ENCOUNTER — Other Ambulatory Visit: Payer: Self-pay

## 2018-10-05 ENCOUNTER — Emergency Department (HOSPITAL_COMMUNITY)
Admission: EM | Admit: 2018-10-05 | Discharge: 2018-10-05 | Disposition: A | Discharge status: Home / Self Care | Payer: Medicare HMO | Attending: Emergency Medicine | Admitting: Emergency Medicine

## 2018-10-05 DIAGNOSIS — Z79899 Other long term (current) drug therapy: Secondary | ICD-10-CM | POA: Insufficient documentation

## 2018-10-05 DIAGNOSIS — Z87891 Personal history of nicotine dependence: Secondary | ICD-10-CM | POA: Diagnosis not present

## 2018-10-05 DIAGNOSIS — K644 Residual hemorrhoidal skin tags: Secondary | ICD-10-CM

## 2018-10-05 DIAGNOSIS — R197 Diarrhea, unspecified: Secondary | ICD-10-CM | POA: Diagnosis not present

## 2018-10-05 DIAGNOSIS — I10 Essential (primary) hypertension: Secondary | ICD-10-CM | POA: Insufficient documentation

## 2018-10-05 HISTORY — DX: Malignant (primary) neoplasm, unspecified: C80.1

## 2018-10-05 LAB — COMPREHENSIVE METABOLIC PANEL
ALBUMIN: 4.3 g/dL (ref 3.5–5.0)
ALK PHOS: 77 U/L (ref 38–126)
ALT: 9 U/L (ref 0–44)
AST: 15 U/L (ref 15–41)
Anion gap: 10 (ref 5–15)
BILIRUBIN TOTAL: 0.6 mg/dL (ref 0.3–1.2)
BUN: 71 mg/dL — AB (ref 8–23)
CALCIUM: 8.8 mg/dL — AB (ref 8.9–10.3)
CO2: 20 mmol/L — ABNORMAL LOW (ref 22–32)
CREATININE: 3.47 mg/dL — AB (ref 0.61–1.24)
Chloride: 108 mmol/L (ref 98–111)
GFR calc Af Amer: 18 mL/min — ABNORMAL LOW (ref >60)
GFR calc non Af Amer: 15 mL/min — ABNORMAL LOW (ref >60)
GLUCOSE: 96 mg/dL (ref 70–99)
Potassium: 4.4 mmol/L (ref 3.5–5.1)
Sodium: 138 mmol/L (ref 135–145)
TOTAL PROTEIN: 7.2 g/dL (ref 6.5–8.1)

## 2018-10-05 LAB — CBC
HCT: 36.3 % — ABNORMAL LOW (ref 39.0–52.0)
HEMOGLOBIN: 12 g/dL — AB (ref 13.0–17.0)
MCH: 31.3 pg (ref 26.0–34.0)
MCHC: 33.1 g/dL (ref 30.0–36.0)
MCV: 94.8 fL (ref 80.0–100.0)
NRBC: 0 % (ref 0.0–0.2)
Platelets: 192 10*3/uL (ref 150–400)
RBC: 3.83 MIL/uL — AB (ref 4.22–5.81)
RDW: 12.9 % (ref 11.5–15.5)
WBC: 6.8 10*3/uL (ref 4.0–10.5)

## 2018-10-05 LAB — POC OCCULT BLOOD, ED: FECAL OCCULT BLD: NEGATIVE

## 2018-10-05 LAB — LIPASE, BLOOD: LIPASE: 64 U/L — AB (ref 11–51)

## 2018-10-05 MED ORDER — SODIUM CHLORIDE 0.9 % IV BOLUS
1000.0000 mL | Freq: Once | INTRAVENOUS | Status: AC
  Administered 2018-10-05: 1000 mL via INTRAVENOUS

## 2018-10-05 MED ORDER — ONDANSETRON HCL 4 MG/2ML IJ SOLN
4.0000 mg | Freq: Once | INTRAMUSCULAR | Status: AC
  Administered 2018-10-05: 4 mg via INTRAVENOUS
  Filled 2018-10-05: qty 2

## 2018-10-05 MED ORDER — HYDROCORTISONE 2.5 % RE CREA: TOPICAL_CREAM | Freq: Three times a day (TID) | RECTAL | 0 refills | Status: AC | PRN

## 2018-10-05 NOTE — ED Triage Notes (Signed)
Pt reports vomiting 5x today and blood in stool since this morning. Pt reports hx of renal cancer. Pt denies taking blood thinners or having any abd pain.

## 2018-10-05 NOTE — ED Provider Notes (Signed)
[facility] DEPT Provider Note   CSN: [id number] Arrival date & time: 10/05/18  1102     History   Chief Complaint No chief complaint on file.   HPI Pedro Young is a 84 y.o. male.  Patient c/o nausea, vomiting, and diarrhea, onset last night. Pt had 5-6 stools, loose to watery, with streaks bright red blood. Emesis clear, not bloody or bilious. Mild epigastric pain, cramping, intermittent, non radiating. No constant, focal abd pain. Denies fever or chills. No other abn bruising or bleeding. No anticoag use. No hx diverticula. No nsaid use. Denies fever or chills. No known bad food ingestion, recent antibiotic use, or ill contacts. No recent travel.   The history is provided by the patient and the spouse.    Past Medical History:  Diagnosis Date  . Renal disorder     Patient Active Problem List   Diagnosis Date Noted  . Gout 08/08/2015  . Acute pancreatitis 08/02/2015  . PRURITUS 03/05/2010  . Anemia of chronic renal failure, stage 4 (severe) ([place]) 01/18/2010  . Liver abscess via hepatic artery 2011, 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 01/18/2010  . HEMATURIA, HX OF 01/18/2010  . GASTRITIS 06/27/2009  . Calculus of gallbladder and bile duct 06/21/2009  . AKI (acute kidney injury) ([place]) 06/19/2009  . LOSS OF WEIGHT 06/19/2009  . ABDOMINAL PAIN, GENERALIZED 06/19/2009  . Pure hyperglyceridemia 06/18/2009  . HYPERTENSION, BENIGN ESSENTIAL 06/18/2009    Past Surgical History:  Procedure Laterality Date  . CHOLECYSTECTOMY          Home Medications    Prior to Admission medications   Medication Sig Start Date End Date Taking? Authorizing Provider  acetaminophen (TYLENOL) 325 MG tablet Take 2 tablets (650 mg total) by mouth once. 08/05/15   Nita Sells, MD  amLODipine (NORVASC) 5 MG tablet Take 1 tablet (5 mg total) by mouth daily. 08/09/15   Geradine Girt, DO  mirtazapine (REMERON) 15 MG tablet Take 15 mg by mouth at bedtime.       [provider]  oxyCODONE-acetaminophen (PERCOCET/ROXICET) 5-325 MG tablet Take 1 tablet by mouth every 4 (four) hours as needed for moderate pain. 08/05/15   Nita Sells, MD  predniSONE (DELTASONE) 10 MG tablet 50 mg x 2 day, 40 mg x 2 day, 30 mg x 2 days, 20 mg x 2 days, 10mg  x 2 days, then d/c 08/09/15   Geradine Girt, DO    Family History Family History  Problem Relation Age of Onset  . Liver disease Father   . Kidney disease Father   . Heart disease Father        before age 72  . Heart disease Mother        before age 64    Social History Social History   Tobacco Use  . Smoking status: Former Research scientist (life sciences)  . Smokeless tobacco: Former Systems developer    Quit date: 11/03/1954  Substance Use Topics  . Alcohol use: No    Alcohol/week: 0.0 standard drinks  . Drug use: No     Allergies   Amoxicillin-pot clavulanate; Atorvastatin; and Lovastatin   Review of Systems Review of Systems  Constitutional: Negative for fever.  HENT: Negative for sore throat.   Eyes: Negative for redness.  Respiratory: Negative for cough and shortness of breath.   Cardiovascular: Negative for chest pain.  Gastrointestinal: Positive for diarrhea and vomiting.  Endocrine: Negative for polyuria.  Genitourinary: Negative for dysuria and flank pain.  Musculoskeletal:  Negative for back pain.  Skin: Negative for rash.  Neurological: Negative for headaches.  Hematological: Does not bruise/bleed easily.  Psychiatric/Behavioral: Negative for confusion.     Physical Exam Updated Vital Signs BP 131/70 (BP Location: Left Arm)   Pulse (!) 59   Temp 97.6 F (36.4 C) (Oral)   Resp 16   Ht 1.626 m (5\' 4" )   Wt 54 kg   SpO2 100%   BMI 20.43 kg/m   Physical Exam  Constitutional: He appears well-developed and well-nourished.  HENT:  Mouth/Throat: Oropharynx is clear and moist.  Eyes: Conjunctivae are normal.  Neck: Neck supple. No tracheal deviation present.  Cardiovascular: Normal rate,  regular rhythm, normal heart sounds and intact distal pulses. Exam reveals no gallop and no friction rub.  No murmur heard. Pulmonary/Chest: Effort normal and breath sounds normal. No accessory muscle usage. No respiratory distress.  Abdominal: Soft. Bowel sounds are normal. He exhibits no distension and no mass. There is no tenderness. There is no guarding.  Genitourinary:  Genitourinary Comments: Light brown stool. Pt with approx 1 cm, external hemorrhoid, inflamed/scant bleeding, not acutely thrombosed. No rectal mass felt.   Musculoskeletal: He exhibits no edema.  Neurological: He is alert.  Skin: Skin is warm and dry. No rash noted.  Psychiatric: He has a normal mood and affect.  Nursing note and vitals reviewed.    ED Treatments / Results  Labs (all labs ordered are listed, but only abnormal results are displayed) Results for orders placed or performed during the hospital encounter of 10/05/18  Comprehensive metabolic panel  Result Value Ref Range   Sodium 138 135 - 145 mmol/L   Potassium 4.4 3.5 - 5.1 mmol/L   Chloride 108 98 - 111 mmol/L   CO2 20 (L) 22 - 32 mmol/L   Glucose, Bld 96 70 - 99 mg/dL   BUN 71 (H) 8 - 23 mg/dL   Creatinine, Ser 3.47 (H) 0.61 - 1.24 mg/dL   Calcium 8.8 (L) 8.9 - 10.3 mg/dL   Total Protein 7.2 6.5 - 8.1 g/dL   Albumin 4.3 3.5 - 5.0 g/dL   AST 15 15 - 41 U/L   ALT 9 0 - 44 U/L   Alkaline Phosphatase 77 38 - 126 U/L   Total Bilirubin 0.6 0.3 - 1.2 mg/dL   GFR calc non Af Amer 15 (L) >60 mL/min   GFR calc Af Amer 18 (L) >60 mL/min   Anion gap 10 5 - 15  CBC  Result Value Ref Range   WBC 6.8 4.0 - 10.5 K/uL   RBC 3.83 (L) 4.22 - 5.81 MIL/uL   Hemoglobin 12.0 (L) 13.0 - 17.0 g/dL   HCT 36.3 (L) 39.0 - 52.0 %   MCV 94.8 80.0 - 100.0 fL   MCH 31.3 26.0 - 34.0 pg   MCHC 33.1 30.0 - 36.0 g/dL   RDW 12.9 11.5 - 15.5 %   Platelets 192 150 - 400 K/uL   nRBC 0.0 0.0 - 0.2 %  Lipase, blood  Result Value Ref Range   Lipase 64 (H) 11 - 51 U/L    POC occult blood, ED  Result Value Ref Range   Fecal Occult Bld NEGATIVE NEGATIVE    EKG None  Radiology No results found.  Procedures Procedures (including critical care time)  Medications Ordered in ED Medications  sodium chloride 0.9 % bolus 1,000 mL (has no administration in time range)  ondansetron (ZOFRAN) injection 4 mg (has no administration in time range)  Initial Impression / Assessment and Plan / ED Course  I have reviewed the triage vital signs and the nursing notes.  Pertinent labs & imaging results that were available during my care of the patient were reviewed by me and considered in my medical decision making (see chart for details).  Iv ns bolus. Labs.   Reviewed nursing notes and prior charts for additional history.   Labs reviewed - chronic renal insuff - creatinine 09/01/18 from care everywhere was 3.35.    Ns bolus in ED.  Stool is heme neg. No active bleeding from inflamed external hemorrhoid.   abd soft nt. No emesis in ED.   Pt appears stable for d/c.    Final Clinical Impressions(s) / ED Diagnoses   Final diagnoses:  None    ED Discharge Orders    None       Lajean Saver, MD 10/05/18 1323

## 2018-10-05 NOTE — Discharge Instructions (Addendum)
It was our pleasure to provide your ER care today - we hope that you feel better.  Rest. Drink plenty of fluids.  For renal/kidney insufficiency, follow up with your kidney doctor as arranged with them.  For hemorrhoid, you may use anusol cream as need for symptom relief.   Follow up with primary care doctor in the next few days for recheck if symptoms fail to improve/resolve.  Return to ER if worse, new symptoms, fevers, severe abdominal pain, persistent vomiting, other concern.

## 2019-03-22 ENCOUNTER — Other Ambulatory Visit: Payer: Self-pay

## 2019-03-22 DIAGNOSIS — N184 Chronic kidney disease, stage 4 (severe): Secondary | ICD-10-CM

## 2019-03-29 ENCOUNTER — Ambulatory Visit (INDEPENDENT_AMBULATORY_CARE_PROVIDER_SITE_OTHER): Payer: Medicare Other | Admitting: Vascular Surgery

## 2019-03-29 ENCOUNTER — Ambulatory Visit (INDEPENDENT_AMBULATORY_CARE_PROVIDER_SITE_OTHER)
Admission: RE | Admit: 2019-03-29 | Discharge: 2019-03-29 | Disposition: A | Discharge status: Home / Self Care | Payer: Medicare Other | Source: Ambulatory Visit | Attending: Family | Admitting: Family

## 2019-03-29 ENCOUNTER — Other Ambulatory Visit: Payer: Self-pay

## 2019-03-29 ENCOUNTER — Ambulatory Visit (HOSPITAL_COMMUNITY)
Admission: RE | Admit: 2019-03-29 | Discharge: 2019-03-29 | Disposition: A | Discharge status: Home / Self Care | Payer: Medicare Other | Source: Ambulatory Visit | Attending: Family | Admitting: Family

## 2019-03-29 ENCOUNTER — Encounter: Payer: Self-pay | Admitting: Vascular Surgery

## 2019-03-29 VITALS — BP 129/80 | HR 59 | Temp 97.2°F | Resp 18 | Ht 64.0 in | Wt 126.0 lb

## 2019-03-29 DIAGNOSIS — N184 Chronic kidney disease, stage 4 (severe): Secondary | ICD-10-CM | POA: Diagnosis present

## 2019-03-29 NOTE — Progress Notes (Signed)
[facility]  Patient name: Pedro Young MRN: [id number] DOB: [birth year] Sex: male  REASON FOR VISIT: Continued discussion of AV access for hemodialysis  HPI: Pedro Young is a 85 y.o. male here today for continued discussion of access for hemodialysis.  I also talking with his daughter by speaker phone with the patient presents and she has limitation of visit due to [place].  I had seen him 2 years ago.  At that time he was felt not to be a fistula candidate due to a small surface veins.  Did not appear that he was at imminent need for hemodialysis and therefore was deferred.  Here today for continued discussion.  Past Medical History:  Diagnosis Date  . Cancer ([place])   . Renal disorder     Family History  Problem Relation Age of Onset  . Liver disease Father   . Kidney disease Father   . Heart disease Father        before age 28  . Heart disease Mother        before age 83    SOCIAL HISTORY: Social History   Tobacco Use  . Smoking status: Former Research scientist (life sciences)  . Smokeless tobacco: Former Systems developer    Quit date: 11/03/1954  Substance Use Topics  . Alcohol use: No    Alcohol/week: 0.0 standard drinks    Allergies  Allergen Reactions  . Amoxicillin-Pot Clavulanate   . Atorvastatin     REACTION: arthralgia  . Lovastatin     REACTION: arthralgia    Current Outpatient Medications  Medication Sig Dispense Refill  . amLODipine (NORVASC) 10 MG tablet Take 10 mg by mouth daily.    Marland Kitchen labetalol (NORMODYNE) 100 MG tablet Take 100 mg by mouth 2 (two) times daily.    . mirtazapine (REMERON) 7.5 MG tablet Take 7.5 mg by mouth at bedtime.    . tamsulosin (FLOMAX) 0.4 MG CAPS capsule Take by mouth.    Marland Kitchen acetaminophen (TYLENOL) 325 MG tablet Take 2 tablets (650 mg total) by mouth once. (Patient not taking: Reported on 10/05/2018)     No current facility-administered medications for this visit.     REVIEW OF SYSTEMS:  [X]   denotes positive finding, [ ]  denotes negative finding Cardiac  Comments:  Chest pain or chest pressure:    Shortness of breath upon exertion:    Short of breath when lying flat:    Irregular heart rhythm:        Vascular    Pain in calf, thigh, or hip brought on by ambulation:    Pain in feet at night that wakes you up from your sleep:     Blood clot in your veins:    Leg swelling:           PHYSICAL EXAM: Vitals:   03/29/19 1406  BP: 129/80  Pulse: (!) 59  Resp: 18  Temp: (!) 97.2 F (36.2 C)  SpO2: 100%  Weight: 126 lb (57.2 kg)  Height: 5\' 4"  (1.626 m)    GENERAL: The patient is a well-nourished male, in no acute distress. The vital signs are documented above. CARDIOVASCULAR: 2+ radial pulses bilaterally.  He does have easily visualized cephalic vein in his forearm but these are quite small bilaterally PULMONARY: There is good air exchange  MUSCULOSKELETAL: There are no major deformities or cyanosis. NEUROLOGIC: No focal weakness or paresthesias are detected. SKIN: There are no ulcers or rashes noted. PSYCHIATRIC: The patient has a normal affect.  DATA:  He underwent repeat imaging and this did show very small surface vein for cephalic and basilic veins bilaterally.  Normal arterial flow.  MEDICAL ISSUES: I again discussed patients with the patient and his daughter present.  Do feel that he would be a candidate for a left arm AV graft placement when he is in imminent need for hemodialysis.  I do have office notes from Dr. Lorrene Reid.  In her note she said that she would consider placement if his GFR was consistency left than 15.  Most recently it was 40.  The patient is to see Dr. Lorrene Reid in the next 2 weeks.  We are available to proceed with left arm AV Gore-Tex graft placement if dialysis access is felt to be an need within the next few months.  Otherwise will defer.  Patient understands this is a outpatient procedure at [facility].  We will await Dr. Sanda Klein  recommendation    Rosetta Posner, MD [facility] Tel [phone] Pager [phone]

## 2019-04-15 ENCOUNTER — Telehealth: Payer: Self-pay | Admitting: *Deleted

## 2019-04-15 NOTE — Telephone Encounter (Signed)
Multiple attempts made to get patient on phone to schedule surgery. [phone] no answer no voice mail.

## 2019-04-21 ENCOUNTER — Encounter: Payer: Self-pay | Admitting: *Deleted

## 2019-04-21 ENCOUNTER — Other Ambulatory Visit: Payer: Self-pay | Admitting: *Deleted

## 2019-04-21 NOTE — Progress Notes (Signed)
Spoke with patient on phone for scheduling surgery and pre-op instructions. Verbalized understanding and letter mailed to patient. To call this office if questions.

## 2019-04-21 NOTE — Progress Notes (Signed)
Shaquina at [facility] called and requested getting patient to OR for HD access. Left arm graft/ No TDC.

## 2019-04-30 ENCOUNTER — Other Ambulatory Visit (HOSPITAL_COMMUNITY)
Admission: RE | Admit: 2019-04-30 | Discharge: 2019-04-30 | Disposition: A | Discharge status: Home / Self Care | Payer: Medicare Other | Source: Ambulatory Visit | Attending: Vascular Surgery | Admitting: Vascular Surgery

## 2019-04-30 DIAGNOSIS — Z1159 Encounter for screening for other viral diseases: Secondary | ICD-10-CM | POA: Diagnosis present

## 2019-04-30 LAB — SARS CORONAVIRUS 2 (TAT 6-24 HRS): SARS Coronavirus 2: NEGATIVE

## 2019-05-03 ENCOUNTER — Other Ambulatory Visit: Payer: Self-pay

## 2019-05-03 ENCOUNTER — Encounter (HOSPITAL_COMMUNITY): Payer: Self-pay | Admitting: *Deleted

## 2019-05-03 NOTE — Progress Notes (Signed)
Mr. Pedro Young asked me to speak to his daughter, Pedro Young.  Mr Chalfin has been in quaranitine since COVID test 04/30/2019.   I instructed Nidia to have patient hold Fish oil.

## 2019-05-04 ENCOUNTER — Encounter (HOSPITAL_COMMUNITY)
Admission: RE | Disposition: A | Discharge status: Home / Self Care | Payer: Self-pay | Source: Home / Self Care | Attending: Vascular Surgery

## 2019-05-04 ENCOUNTER — Ambulatory Visit (HOSPITAL_COMMUNITY): Payer: Medicare Other | Admitting: Anesthesiology

## 2019-05-04 ENCOUNTER — Other Ambulatory Visit: Payer: Self-pay

## 2019-05-04 ENCOUNTER — Encounter (HOSPITAL_COMMUNITY): Payer: Self-pay

## 2019-05-04 ENCOUNTER — Ambulatory Visit (HOSPITAL_COMMUNITY)
Admission: RE | Admit: 2019-05-04 | Discharge: 2019-05-04 | Disposition: A | Discharge status: Home / Self Care | Payer: Medicare Other | Attending: Vascular Surgery | Admitting: Vascular Surgery

## 2019-05-04 DIAGNOSIS — Z79899 Other long term (current) drug therapy: Secondary | ICD-10-CM | POA: Insufficient documentation

## 2019-05-04 DIAGNOSIS — Z888 Allergy status to other drugs, medicaments and biological substances status: Secondary | ICD-10-CM | POA: Insufficient documentation

## 2019-05-04 DIAGNOSIS — Z880 Allergy status to penicillin: Secondary | ICD-10-CM | POA: Diagnosis not present

## 2019-05-04 DIAGNOSIS — Z87891 Personal history of nicotine dependence: Secondary | ICD-10-CM | POA: Diagnosis not present

## 2019-05-04 DIAGNOSIS — N189 Chronic kidney disease, unspecified: Secondary | ICD-10-CM

## 2019-05-04 DIAGNOSIS — I120 Hypertensive chronic kidney disease with stage 5 chronic kidney disease or end stage renal disease: Secondary | ICD-10-CM | POA: Insufficient documentation

## 2019-05-04 DIAGNOSIS — N185 Chronic kidney disease, stage 5: Secondary | ICD-10-CM | POA: Diagnosis not present

## 2019-05-04 DIAGNOSIS — N186 End stage renal disease: Secondary | ICD-10-CM | POA: Insufficient documentation

## 2019-05-04 HISTORY — DX: Essential (primary) hypertension: I10

## 2019-05-04 HISTORY — PX: AV FISTULA PLACEMENT: SHX1204

## 2019-05-04 LAB — POCT I-STAT 4, (NA,K, GLUC, HGB,HCT)
Glucose, Bld: 93 mg/dL (ref 70–99)
HCT: 32 % — ABNORMAL LOW (ref 39.0–52.0)
Hemoglobin: 10.9 g/dL — ABNORMAL LOW (ref 13.0–17.0)
Potassium: 3.9 mmol/L (ref 3.5–5.1)
Sodium: 138 mmol/L (ref 135–145)

## 2019-05-04 SURGERY — ARTERIOVENOUS (AV) FISTULA CREATION
Anesthesia: General | Site: Arm Lower | Laterality: Left

## 2019-05-04 MED ORDER — CHLORHEXIDINE GLUCONATE 4 % EX LIQD: 60.0000 mL | Freq: Once | CUTANEOUS | Status: DC

## 2019-05-04 MED ORDER — ONDANSETRON HCL 4 MG/2ML IJ SOLN: 4.0000 mg | Freq: Four times a day (QID) | INTRAMUSCULAR | Status: DC | PRN

## 2019-05-04 MED ORDER — ONDANSETRON HCL 4 MG/2ML IJ SOLN
INTRAMUSCULAR | Status: DC | PRN
  Administered 2019-05-04: 4 mg via INTRAVENOUS

## 2019-05-04 MED ORDER — PROPOFOL 10 MG/ML IV BOLUS
INTRAVENOUS | Status: DC | PRN
  Administered 2019-05-04: 150 mg via INTRAVENOUS
  Administered 2019-05-04: 50 mg via INTRAVENOUS

## 2019-05-04 MED ORDER — SODIUM CHLORIDE 0.9 % IV SOLN
INTRAVENOUS | Status: DC | PRN
  Administered 2019-05-04: 25 ug/min via INTRAVENOUS

## 2019-05-04 MED ORDER — VANCOMYCIN HCL IN DEXTROSE 1-5 GM/200ML-% IV SOLN
1000.0000 mg | INTRAVENOUS | Status: AC
  Administered 2019-05-04: 1000 mg via INTRAVENOUS

## 2019-05-04 MED ORDER — PHENYLEPHRINE 40 MCG/ML (10ML) SYRINGE FOR IV PUSH (FOR BLOOD PRESSURE SUPPORT)
PREFILLED_SYRINGE | INTRAVENOUS | Status: DC | PRN
  Administered 2019-05-04: 120 ug via INTRAVENOUS
  Administered 2019-05-04: 80 ug via INTRAVENOUS
  Administered 2019-05-04: 120 ug via INTRAVENOUS

## 2019-05-04 MED ORDER — SODIUM CHLORIDE 0.9 % IV SOLN
INTRAVENOUS | Status: DC | PRN
  Administered 2019-05-04: 500 mL

## 2019-05-04 MED ORDER — PROPOFOL 10 MG/ML IV BOLUS
INTRAVENOUS | Status: AC
  Filled 2019-05-04: qty 20

## 2019-05-04 MED ORDER — OXYCODONE-ACETAMINOPHEN 5-325 MG PO TABS: 1.0000 | ORAL_TABLET | Freq: Four times a day (QID) | ORAL | 0 refills | Status: DC | PRN

## 2019-05-04 MED ORDER — FENTANYL CITRATE (PF) 100 MCG/2ML IJ SOLN: 25.0000 ug | INTRAMUSCULAR | Status: DC | PRN

## 2019-05-04 MED ORDER — SODIUM CHLORIDE 0.9 % IV SOLN
INTRAVENOUS | Status: DC
  Administered 2019-05-04: 09:00:00 via INTRAVENOUS

## 2019-05-04 MED ORDER — VANCOMYCIN HCL IN DEXTROSE 1-5 GM/200ML-% IV SOLN
INTRAVENOUS | Status: AC
  Administered 2019-05-04: 1000 mg via INTRAVENOUS
  Filled 2019-05-04: qty 200

## 2019-05-04 MED ORDER — EPHEDRINE SULFATE-NACL 50-0.9 MG/10ML-% IV SOSY
PREFILLED_SYRINGE | INTRAVENOUS | Status: DC | PRN
  Administered 2019-05-04 (×3): 10 mg via INTRAVENOUS
  Administered 2019-05-04: 5 mg via INTRAVENOUS

## 2019-05-04 MED ORDER — OXYCODONE HCL 5 MG PO TABS: 5.0000 mg | ORAL_TABLET | Freq: Once | ORAL | Status: DC | PRN

## 2019-05-04 MED ORDER — FENTANYL CITRATE (PF) 100 MCG/2ML IJ SOLN
INTRAMUSCULAR | Status: DC | PRN
  Administered 2019-05-04: 25 ug via INTRAVENOUS

## 2019-05-04 MED ORDER — LIDOCAINE 2% (20 MG/ML) 5 ML SYRINGE
INTRAMUSCULAR | Status: DC | PRN
  Administered 2019-05-04: 60 mg via INTRAVENOUS

## 2019-05-04 MED ORDER — SODIUM CHLORIDE 0.9 % IV SOLN
INTRAVENOUS | Status: AC
  Filled 2019-05-04: qty 1.2

## 2019-05-04 MED ORDER — FENTANYL CITRATE (PF) 250 MCG/5ML IJ SOLN
INTRAMUSCULAR | Status: AC
  Filled 2019-05-04: qty 5

## 2019-05-04 MED ORDER — 0.9 % SODIUM CHLORIDE (POUR BTL) OPTIME
TOPICAL | Status: DC | PRN
  Administered 2019-05-04: 1000 mL

## 2019-05-04 MED ORDER — HEPARIN SODIUM (PORCINE) 1000 UNIT/ML IJ SOLN
INTRAMUSCULAR | Status: AC
  Filled 2019-05-04: qty 1

## 2019-05-04 MED ORDER — PHENYLEPHRINE 40 MCG/ML (10ML) SYRINGE FOR IV PUSH (FOR BLOOD PRESSURE SUPPORT)
PREFILLED_SYRINGE | INTRAVENOUS | Status: AC
  Filled 2019-05-04: qty 10

## 2019-05-04 MED ORDER — OXYCODONE HCL 5 MG/5ML PO SOLN: 5.0000 mg | Freq: Once | ORAL | Status: DC | PRN

## 2019-05-04 SURGICAL SUPPLY — 29 items
ARMBAND PINK RESTRICT EXTREMIT (MISCELLANEOUS) ×3 IMPLANT
CANISTER SUCT 3000ML PPV (MISCELLANEOUS) ×3 IMPLANT
CANNULA VESSEL 3MM 2 BLNT TIP (CANNULA) ×3 IMPLANT
CLIP LIGATING EXTRA MED SLVR (CLIP) ×3 IMPLANT
CLIP LIGATING EXTRA SM BLUE (MISCELLANEOUS) ×3 IMPLANT
COVER WAND RF STERILE (DRAPES) ×3 IMPLANT
DECANTER SPIKE VIAL GLASS SM (MISCELLANEOUS) ×3 IMPLANT
DERMABOND ADVANCED (GAUZE/BANDAGES/DRESSINGS) ×1
DERMABOND ADVANCED .7 DNX12 (GAUZE/BANDAGES/DRESSINGS) ×2 IMPLANT
ELECT REM PT RETURN 9FT ADLT (ELECTROSURGICAL) ×3
ELECTRODE REM PT RTRN 9FT ADLT (ELECTROSURGICAL) ×2 IMPLANT
GAUZE SPONGE 4X4 12PLY STRL (GAUZE/BANDAGES/DRESSINGS) ×3 IMPLANT
GLOVE SS BIOGEL STRL SZ 7.5 (GLOVE) ×2 IMPLANT
GLOVE SUPERSENSE BIOGEL SZ 7.5 (GLOVE) ×1
GOWN STRL REUS W/ TWL LRG LVL3 (GOWN DISPOSABLE) ×6 IMPLANT
GOWN STRL REUS W/TWL LRG LVL3 (GOWN DISPOSABLE) ×3
KIT BASIN OR (CUSTOM PROCEDURE TRAY) ×3 IMPLANT
KIT TURNOVER KIT B (KITS) ×3 IMPLANT
NS IRRIG 1000ML POUR BTL (IV SOLUTION) ×3 IMPLANT
PACK CV ACCESS (CUSTOM PROCEDURE TRAY) ×3 IMPLANT
PAD ARMBOARD 7.5X6 YLW CONV (MISCELLANEOUS) ×6 IMPLANT
SUT PROLENE 6 0 CC (SUTURE) ×6 IMPLANT
SUT SILK 2 0 PERMA HAND 18 BK (SUTURE) IMPLANT
SUT VIC AB 3-0 SH 27 (SUTURE) ×2
SUT VIC AB 3-0 SH 27X BRD (SUTURE) ×4 IMPLANT
SYR TOOMEY 50ML (SYRINGE) IMPLANT
TOWEL GREEN STERILE (TOWEL DISPOSABLE) ×3 IMPLANT
UNDERPAD 30X30 (UNDERPADS AND DIAPERS) ×3 IMPLANT
WATER STERILE IRR 1000ML POUR (IV SOLUTION) ×3 IMPLANT

## 2019-05-04 NOTE — Discharge Instructions (Signed)
° °  [facility] of [facility]  Discharge Instructions  AV Fistula or Graft Surgery for Dialysis Access  Please refer to the following instructions for your post-procedure care. Your surgeon or physician assistant will discuss any changes with you.  Activity  You may drive the day following your surgery, if you are comfortable and no longer taking prescription pain medication. Resume full activity as the soreness in your incision resolves.  Bathing/Showering  You may shower after you go home. Keep your incision dry for 48 hours. Do not soak in a bathtub, hot tub, or swim until the incision heals completely. You may not shower if you have a hemodialysis catheter.  Incision Care  Clean your incision with mild soap and water after 48 hours. Pat the area dry with a clean towel. You do not need a bandage unless otherwise instructed. Do not apply any ointments or creams to your incision. You may have skin glue on your incision. Do not peel it off. It will come off on its own in about one week. Your arm may swell a bit after surgery. To reduce swelling use pillows to elevate your arm so it is above your heart. Your doctor will tell you if you need to lightly wrap your arm with an ACE bandage.  Diet  Resume your normal diet. There are not special food restrictions following this procedure. In order to heal from your surgery, it is CRITICAL to get adequate nutrition. Your body requires vitamins, minerals, and protein. Vegetables are the best source of vitamins and minerals. Vegetables also provide the perfect balance of protein. Processed food has little nutritional value, so try to avoid this.  Medications  Resume taking all of your medications. If your incision is causing pain, you may take over-the counter pain relievers such as acetaminophen (Tylenol). If you were prescribed a stronger pain medication, please be aware these medications can cause nausea and constipation. Prevent  nausea by taking the medication with a snack or meal. Avoid constipation by drinking plenty of fluids and eating foods with high amount of fiber, such as fruits, vegetables, and grains.  Do not take Tylenol if you are taking prescription pain medications.  Follow up Your surgeon may want to see you in the office following your access surgery. If so, this will be arranged at the time of your surgery.  Please call us immediately for any of the following conditions:  Increased pain, redness, drainage (pus) from your incision site Fever of 101 degrees or higher Severe or worsening pain at your incision site Hand pain or numbness.  Reduce your risk of vascular disease:  Stop smoking. If you would like help, call [facility] at 1-800-QUIT-NOW (757)796-6602) or [place] at [place] your cholesterol Maintain a desired weight Control your diabetes Keep your blood pressure down  Dialysis  It will take several weeks to several months for your new dialysis access to be ready for use. Your surgeon will determine when it is okay to use it. Your nephrologist will continue to direct your dialysis. You can continue to use your Permcath until your new access is ready for use.   05/04/2019 Pedro Young [id number] 06-11-1933  Surgeon(s): Early, Arvilla Meres, MD  Procedure(s): Creation of left radial cephalic AV fistula  x Do not stick fistula for 12 weeks    If you have any questions, please call the office at [phone].

## 2019-05-04 NOTE — Transfer of Care (Signed)
Immediate Anesthesia Transfer of Care Note  Patient: Pedro Young  Procedure(s) Performed: Arteriovenous (Av) Fistula Creation Left Arm (Left Arm Lower)  Patient Location: PACU  Anesthesia Type:General  Level of Consciousness: patient cooperative and responds to stimulation  Airway & Oxygen Therapy: Patient Spontanous Breathing and Patient connected to face mask oxygen OPA Post-op Assessment: Report given to RN and Post -op Vital signs reviewed and stable  Post vital signs: Reviewed and stable  Last Vitals:  Vitals Value Taken Time  BP 132/73 05/04/19 1159  Temp    Pulse 57 05/04/19 1202  Resp 19 05/04/19 1202  SpO2 100 % 05/04/19 1202  Vitals shown include unvalidated device data.  Last Pain:  Vitals:   05/04/19 0826  TempSrc:   PainSc: 0-No pain      Patients Stated Pain Goal: 0 (09/98/33 8250)  Complications: No apparent anesthesia complications

## 2019-05-04 NOTE — Op Note (Signed)
    OPERATIVE REPORT  DATE OF SURGERY: 05/04/2019  PATIENT: Pedro Young, 85 y.o. male MRN: [id number]  DOB: 18-Feb-1933  PRE-OPERATIVE DIAGNOSIS: Chronic renal insufficiency  POST-OPERATIVE DIAGNOSIS:  Same  PROCEDURE: Left radiocephalic AV fistula creation  SURGEON:  Curt Jews, M.D.  PHYSICIAN ASSISTANT: Liana Crocker, PA-C  ANESTHESIA: LMA  EBL: per anesthesia record  No intake/output data recorded.  BLOOD ADMINISTERED: none  DRAINS: none  SPECIMEN: none  COUNTS CORRECT:  YES  PATIENT DISPOSITION:  PACU - hemodynamically stable  PROCEDURE DETAILS: The patient was taken operating place evaluation where the area of the left arm prepped draped you sterile fashion.  Preoperative imaging had suggested small cephalic vein.  On examination under anesthesia the patient did have a large caliber cephalic vein.  He is extremely thin.  Decision was made to explore the cephalic vein at the wrist to determine if he was an adequate caliber candidate for fistula creation.  Incision was made between the level of the radial artery and the cephalic vein at the wrist.  The vein branches were ligated with 3-0 and 4-0 silk ties and divided.  The vein was ligated distally and divided and was cannulated with vessel cannula.  The vein was gently dilated and was of excellent caliber.  The brachial artery was exposed through the same incision and was also of excellent size with minimal atherosclerotic change.  The artery was occluded proximally distally and was opened with 11 blade similarly with Potts scissors.  The vein was cut to the appropriate length and was spatulated and sewn end-to-side to the artery with a running 6-0 Prolene suture.  Clamps removed and excellent thrill was noted.  The wounds irrigated with saline.  Hemostasis left cautery.  Wounds were closed with 3-0 Vicryl in the subcutaneous subcuticular tissue.  Sterile dressing was applied and the patient was transferred to the recovery  room in stable condition   Pedro Young, M.D., [facility] 05/04/2019 11:59 AM

## 2019-05-04 NOTE — Anesthesia Preprocedure Evaluation (Signed)
Anesthesia Evaluation  Patient identified by MRN, date of birth, ID band Patient awake    Reviewed: Allergy & Precautions, H&P , NPO status , Patient's Chart, lab work & pertinent test results  Airway Mallampati: II   Neck ROM: full    Dental   Pulmonary former smoker,    breath sounds clear to auscultation       Cardiovascular hypertension,  Rhythm:regular Rate:Normal     Neuro/Psych    GI/Hepatic   Endo/Other    Renal/GU ESRF and DialysisRenal disease     Musculoskeletal   Abdominal   Peds  Hematology  (+) Blood dyscrasia, anemia ,   Anesthesia Other Findings   Reproductive/Obstetrics                             Anesthesia Physical Anesthesia Plan  ASA: III  Anesthesia Plan: General   Post-op Pain Management:    Induction: Intravenous  PONV Risk Score and Plan: 2 and Ondansetron, Dexamethasone and Treatment may vary due to age or medical condition  Airway Management Planned: LMA  Additional Equipment:   Intra-op Plan:   Post-operative Plan:   Informed Consent: I have reviewed the patients History and Physical, chart, labs and discussed the procedure including the risks, benefits and alternatives for the proposed anesthesia with the patient or authorized representative who has indicated his/her understanding and acceptance.       Plan Discussed with: CRNA, Anesthesiologist and Surgeon  Anesthesia Plan Comments:         Anesthesia Quick Evaluation

## 2019-05-04 NOTE — H&P (Signed)
Pedro Posner, MD  Physician  Vascular Surgery  Progress Notes  Signed  Encounter Date:  03/29/2019          Signed         Show:Clear all [x] Manual[x] Template[] Copied  Added by: [x] Pedro Young, Pedro Meres, MD  [] Hover for details                                       Vascular and Vein Specialist of [facility]  Patient name: Pedro Young         MRN: [id number]        DOB: 05/07/1933            Sex: male  REASON FOR VISIT: Continued discussion of AV access for hemodialysis  HPI: Pedro Young is a 85 y.o. male here today for continued discussion of access for hemodialysis.  I also talking with his daughter by speaker phone with the patient presents and she has limitation of visit due to [place].  I had seen him 2 years ago.  At that time he was felt not to be a fistula candidate due to a small surface veins.  Did not appear that he was at imminent need for hemodialysis and therefore was deferred.  Here today for continued discussion.      Past Medical History:  Diagnosis Date  . Cancer ([place])   . Renal disorder          Family History  Problem Relation Age of Onset  . Liver disease Father   . Kidney disease Father   . Heart disease Father        before age 1  . Heart disease Mother        before age 47    SOCIAL HISTORY: Social History        Tobacco Use  . Smoking status: Former Research scientist (life sciences)  . Smokeless tobacco: Former Systems developer    Quit date: 11/03/1954  Substance Use Topics  . Alcohol use: No    Alcohol/week: 0.0 standard drinks         Allergies  Allergen Reactions  . Amoxicillin-Pot Clavulanate   . Atorvastatin     REACTION: arthralgia  . Lovastatin     REACTION: arthralgia          Current Outpatient Medications  Medication Sig Dispense Refill  . amLODipine (NORVASC) 10 MG tablet Take 10 mg by mouth daily.    Marland Kitchen labetalol (NORMODYNE) 100 MG tablet Take 100 mg by mouth 2 (two) times daily.    . mirtazapine (REMERON) 7.5 MG  tablet Take 7.5 mg by mouth at bedtime.    . tamsulosin (FLOMAX) 0.4 MG CAPS capsule Take by mouth.    Marland Kitchen acetaminophen (TYLENOL) 325 MG tablet Take 2 tablets (650 mg total) by mouth once. (Patient not taking: Reported on 10/05/2018)     No current facility-administered medications for this visit.     REVIEW OF SYSTEMS:  [X]  denotes positive finding, [ ]  denotes negative finding Cardiac  Comments:  Chest pain or chest pressure:    Shortness of breath upon exertion:    Short of breath when lying flat:    Irregular heart rhythm:        Vascular    Pain in calf, thigh, or hip brought on by ambulation:    Pain in feet at night that wakes you up from your sleep:     Blood  clot in your veins:    Leg swelling:           PHYSICAL EXAM:    Vitals:   03/29/19 1406  BP: 129/80  Pulse: (!) 59  Resp: 18  Temp: (!) 97.2 F (36.2 C)  SpO2: 100%  Weight: 126 lb (57.2 kg)  Height: 5\' 4"  (1.626 m)    GENERAL: The patient is a well-nourished male, in no acute distress. The vital signs are documented above. CARDIOVASCULAR: 2+ radial pulses bilaterally.  He does have easily visualized cephalic vein in his forearm but these are quite small bilaterally PULMONARY: There is good air exchange  MUSCULOSKELETAL: There are no major deformities or cyanosis. NEUROLOGIC: No focal weakness or paresthesias are detected. SKIN: There are no ulcers or rashes noted. PSYCHIATRIC: The patient has a normal affect.  DATA:  He underwent repeat imaging and this did show very small surface vein for cephalic and basilic veins bilaterally.  Normal arterial flow.  MEDICAL ISSUES: I again discussed patients with the patient and his daughter present.  Do feel that he would be a candidate for a left arm AV graft placement when he is in imminent need for hemodialysis.  I do have office notes from Dr. Lorrene Young.  In her note she said that she would consider placement if his GFR was  consistency left than 15.  Most recently it was 31.  The patient is to see Dr. Lorrene Young in the next 2 weeks.  We are available to proceed with left arm AV Gore-Tex graft placement if dialysis access is felt to be an need within the next few months.  Otherwise will defer.  Patient understands this is a outpatient procedure at [facility].  We will await Dr. Sanda Young recommendation    Pedro Posner, MD [facility] Specialists of [facility] [phone] Pager [phone]         Addendum:  The patient has been re-examined and re-evaluated.  The patient's history and physical has been reviewed and is unchanged.    Pedro Young is a 85 y.o. male is being admitted with CHRONIC KIDNEY DISEASE FOR HEMODIALYSIS ACCESS. All the risks, benefits and other treatment options have been discussed with the patient. The patient has consented to proceed with Procedure(s): INSERTION OF ARTERIOVENOUS (AV) GORE-TEX GRAFT LEFT ARM as a surgical intervention.  Pedro Young 05/04/2019 7:42 AM Vascular and Vein Surgery

## 2019-05-05 ENCOUNTER — Encounter (HOSPITAL_COMMUNITY): Payer: Self-pay | Admitting: Vascular Surgery

## 2019-05-05 NOTE — Anesthesia Postprocedure Evaluation (Signed)
Anesthesia Post Note  Patient: Freeman Borba  Procedure(s) Performed: Arteriovenous (Av) Fistula Creation Left Arm (Left Arm Lower)     Patient location during evaluation: PACU Anesthesia Type: General Level of consciousness: awake and alert Pain management: pain level controlled Vital Signs Assessment: post-procedure vital signs reviewed and stable Respiratory status: spontaneous breathing, nonlabored ventilation, respiratory function stable and patient connected to nasal cannula oxygen Cardiovascular status: blood pressure returned to baseline and stable Postop Assessment: no apparent nausea or vomiting Anesthetic complications: no    Last Vitals:  Vitals:   05/04/19 1215 05/04/19 1230  BP: 124/80 121/72  Pulse: 60 60  Resp: 12 14  Temp:  (!) 36.3 C  SpO2: 100% 100%    Last Pain:  Vitals:   05/04/19 1230  TempSrc:   PainSc: 0-No pain                 Samer Dutton S

## 2019-05-23 ENCOUNTER — Other Ambulatory Visit (HOSPITAL_COMMUNITY): Payer: Self-pay | Admitting: *Deleted

## 2019-05-24 ENCOUNTER — Other Ambulatory Visit: Payer: Self-pay

## 2019-05-24 ENCOUNTER — Encounter (HOSPITAL_COMMUNITY)
Admission: RE | Admit: 2019-05-24 | Discharge: 2019-05-24 | Disposition: A | Discharge status: Home / Self Care | Payer: Medicare Other | Source: Ambulatory Visit | Attending: Nephrology | Admitting: Nephrology

## 2019-05-24 DIAGNOSIS — N189 Chronic kidney disease, unspecified: Secondary | ICD-10-CM | POA: Insufficient documentation

## 2019-05-24 DIAGNOSIS — D631 Anemia in chronic kidney disease: Secondary | ICD-10-CM | POA: Diagnosis not present

## 2019-05-24 MED ORDER — SODIUM CHLORIDE 0.9 % IV SOLN
510.0000 mg | Freq: Once | INTRAVENOUS | Status: AC
  Administered 2019-05-24: 510 mg via INTRAVENOUS
  Filled 2019-05-24: qty 17

## 2019-06-09 ENCOUNTER — Other Ambulatory Visit: Payer: Self-pay

## 2019-06-09 DIAGNOSIS — N184 Chronic kidney disease, stage 4 (severe): Secondary | ICD-10-CM

## 2019-06-14 ENCOUNTER — Ambulatory Visit (INDEPENDENT_AMBULATORY_CARE_PROVIDER_SITE_OTHER): Payer: Self-pay | Admitting: Physician Assistant

## 2019-06-14 ENCOUNTER — Other Ambulatory Visit: Payer: Self-pay

## 2019-06-14 ENCOUNTER — Ambulatory Visit (HOSPITAL_COMMUNITY)
Admission: RE | Admit: 2019-06-14 | Discharge: 2019-06-14 | Disposition: A | Discharge status: Home / Self Care | Payer: Medicare Other | Source: Ambulatory Visit | Attending: Family | Admitting: Family

## 2019-06-14 VITALS — BP 140/62 | HR 63 | Temp 97.9°F | Resp 14 | Ht 67.0 in | Wt 126.4 lb

## 2019-06-14 DIAGNOSIS — N184 Chronic kidney disease, stage 4 (severe): Secondary | ICD-10-CM | POA: Insufficient documentation

## 2019-06-14 NOTE — Progress Notes (Signed)
  POST OPERATIVE OFFICE NOTE    CC:  F/u for surgery  HPI:  This is a 85 y.o. male who is s/p Left radiocephalic AV fistula creation.  He is here for f/u and to check the maturity of the fistula.  He denise pain, loss of motor or sensation.  He is currently not on HD.  Allergies  Allergen Reactions  . Amoxicillin-Pot Clavulanate Other (See Comments)    Did it involve swelling of the face/tongue/throat, SOB, or low BP? Unknown Did it involve sudden or severe rash/hives, skin peeling, or any reaction on the inside of your mouth or nose? Unknown Did you need to seek medical attention at a hospital or doctor's office? Unknown When did it last happen?patient is unfamiliar with this allergy If all above answers are "NO", may proceed with cephalosporin use.   . Atorvastatin     REACTION: arthralgia  . Lovastatin     REACTION: arthralgia    Current Outpatient Medications  Medication Sig Dispense Refill  . amLODipine (NORVASC) 10 MG tablet Take 10 mg by mouth at bedtime.     Marland Kitchen labetalol (NORMODYNE) 100 MG tablet Take 100 mg by mouth 2 (two) times daily.    . Omega-3 Fatty Acids (FISH OIL PO) Take 1,500 mg by mouth 2 (two) times a day.    . tamsulosin (FLOMAX) 0.4 MG CAPS capsule Take 0.4 mg by mouth daily.     No current facility-administered medications for this visit.      ROS:  See HPI  Physical Exam: Today's Vitals   06/14/19 1414  BP: 140/62  Pulse: 63  Resp: 14  Temp: 97.9 F (36.6 C)  TempSrc: Temporal  SpO2: 98%  Weight: 126 lb 6.4 oz (57.3 kg)  Height: 5\' 7"  (1.702 m)   Body mass index is 19.8 kg/m.   Great thrill palpated in the left forearm AV fistula.  Visible well developing fistula. Grip 5/5, sensation and motor intact.  Well helaed incision.    Assessment/Plan:  This is a 85 y.o. male who is s/p: S/P Left Radiocephalic AV fistula  The fistula may be accessed on 07/05/2019.  He is currently not on HD.  Roxy Horseman PA-C Vascular and Vein  Specialists [phone]  Clinic MD:  Early

## 2019-07-28 ENCOUNTER — Emergency Department (HOSPITAL_COMMUNITY): Payer: Medicare Other

## 2019-07-28 ENCOUNTER — Other Ambulatory Visit: Payer: Self-pay

## 2019-07-28 ENCOUNTER — Encounter (HOSPITAL_COMMUNITY): Payer: Self-pay | Admitting: Emergency Medicine

## 2019-07-28 ENCOUNTER — Emergency Department (HOSPITAL_COMMUNITY)
Admission: EM | Admit: 2019-07-28 | Discharge: 2019-07-28 | Disposition: A | Discharge status: Home / Self Care | Payer: Medicare Other | Attending: Emergency Medicine | Admitting: Emergency Medicine

## 2019-07-28 DIAGNOSIS — N184 Chronic kidney disease, stage 4 (severe): Secondary | ICD-10-CM | POA: Diagnosis not present

## 2019-07-28 DIAGNOSIS — K529 Noninfective gastroenteritis and colitis, unspecified: Secondary | ICD-10-CM | POA: Diagnosis not present

## 2019-07-28 DIAGNOSIS — I129 Hypertensive chronic kidney disease with stage 1 through stage 4 chronic kidney disease, or unspecified chronic kidney disease: Secondary | ICD-10-CM | POA: Insufficient documentation

## 2019-07-28 DIAGNOSIS — Z79899 Other long term (current) drug therapy: Secondary | ICD-10-CM | POA: Insufficient documentation

## 2019-07-28 DIAGNOSIS — Z87891 Personal history of nicotine dependence: Secondary | ICD-10-CM | POA: Insufficient documentation

## 2019-07-28 DIAGNOSIS — R109 Unspecified abdominal pain: Secondary | ICD-10-CM | POA: Diagnosis present

## 2019-07-28 LAB — LIPASE, BLOOD: Lipase: 25 U/L (ref 11–51)

## 2019-07-28 LAB — COMPREHENSIVE METABOLIC PANEL
ALT: 10 U/L (ref 0–44)
AST: 13 U/L — ABNORMAL LOW (ref 15–41)
Albumin: 3.5 g/dL (ref 3.5–5.0)
Alkaline Phosphatase: 73 U/L (ref 38–126)
Anion gap: 10 (ref 5–15)
BUN: 70 mg/dL — ABNORMAL HIGH (ref 8–23)
CO2: 20 mmol/L — ABNORMAL LOW (ref 22–32)
Calcium: 8.4 mg/dL — ABNORMAL LOW (ref 8.9–10.3)
Chloride: 110 mmol/L (ref 98–111)
Creatinine, Ser: 3.82 mg/dL — ABNORMAL HIGH (ref 0.61–1.24)
GFR calc Af Amer: 16 mL/min — ABNORMAL LOW (ref >60)
GFR calc non Af Amer: 14 mL/min — ABNORMAL LOW (ref >60)
Glucose, Bld: 98 mg/dL (ref 70–99)
Potassium: 4.1 mmol/L (ref 3.5–5.1)
Sodium: 140 mmol/L (ref 135–145)
Total Bilirubin: 0.7 mg/dL (ref 0.3–1.2)
Total Protein: 6.8 g/dL (ref 6.5–8.1)

## 2019-07-28 LAB — CBC
HCT: 34.1 % — ABNORMAL LOW (ref 39.0–52.0)
Hemoglobin: 10.8 g/dL — ABNORMAL LOW (ref 13.0–17.0)
MCH: 30.8 pg (ref 26.0–34.0)
MCHC: 31.7 g/dL (ref 30.0–36.0)
MCV: 97.2 fL (ref 80.0–100.0)
Platelets: 142 10*3/uL — ABNORMAL LOW (ref 150–400)
RBC: 3.51 MIL/uL — ABNORMAL LOW (ref 4.22–5.81)
RDW: 13 % (ref 11.5–15.5)
WBC: 5.9 10*3/uL (ref 4.0–10.5)
nRBC: 0 % (ref 0.0–0.2)

## 2019-07-28 LAB — URINALYSIS, ROUTINE W REFLEX MICROSCOPIC
Bacteria, UA: NONE SEEN
Bilirubin Urine: NEGATIVE
Glucose, UA: NEGATIVE mg/dL
Ketones, ur: NEGATIVE mg/dL
Leukocytes,Ua: NEGATIVE
Nitrite: NEGATIVE
Protein, ur: 100 mg/dL — AB
Specific Gravity, Urine: 1.013 (ref 1.005–1.030)
pH: 5 (ref 5.0–8.0)

## 2019-07-28 MED ORDER — SODIUM CHLORIDE 0.9 % IV SOLN: INTRAVENOUS | Status: DC

## 2019-07-28 MED ORDER — CIPROFLOXACIN IN D5W 400 MG/200ML IV SOLN
400.0000 mg | Freq: Once | INTRAVENOUS | Status: AC
  Administered 2019-07-28: 400 mg via INTRAVENOUS
  Filled 2019-07-28: qty 200

## 2019-07-28 MED ORDER — METRONIDAZOLE 500 MG PO TABS: 500.0000 mg | ORAL_TABLET | Freq: Two times a day (BID) | ORAL | 0 refills | Status: DC

## 2019-07-28 MED ORDER — METRONIDAZOLE IN NACL 5-0.79 MG/ML-% IV SOLN
500.0000 mg | Freq: Once | INTRAVENOUS | Status: AC
  Administered 2019-07-28: 500 mg via INTRAVENOUS
  Filled 2019-07-28: qty 100

## 2019-07-28 MED ORDER — IOHEXOL 300 MG/ML  SOLN
30.0000 mL | Freq: Once | INTRAMUSCULAR | Status: AC | PRN
  Administered 2019-07-28: 17:00:00 30 mL via ORAL

## 2019-07-28 MED ORDER — CIPROFLOXACIN HCL 500 MG PO TABS: 500.0000 mg | ORAL_TABLET | Freq: Two times a day (BID) | ORAL | 0 refills | Status: DC

## 2019-07-28 NOTE — ED Notes (Signed)
Pt ambulatory to restroom

## 2019-07-28 NOTE — ED Notes (Signed)
Pt refused discharge vital signs

## 2019-07-28 NOTE — ED Triage Notes (Signed)
Patient BIB daughter, reports abdominal pain and diarrhea x2 days. Reports dark stools. Denies vomiting.

## 2019-07-28 NOTE — Discharge Instructions (Addendum)
Please be sure to monitor your condition carefully, and do not hesitate to return here for concerning changes.  Tylenol is appropriate for pain control, and if you have excessive amounts of diarrhea, a small dose of Imodium may be beneficial.

## 2019-07-28 NOTE — ED Provider Notes (Signed)
[facility] DEPT Provider Note   CSN: [id number] Arrival date & time: 07/28/19  1055     History   Chief Complaint Chief Complaint  Patient presents with  . Abdominal Pain  . Diarrhea    HPI Pedro Young is a 85 y.o. male.     HPI Presents with his daughter who assists with HPI. Patient does not speak [place].  Patient is an emigrated from [place], but arrived more than 50 years ago.  He presents with concern of abdominal pain, nausea, vomiting, anorexia. Onset was 4 days ago. Since onset symptoms have been persistent with worsening sharp severe pain in the left lower quadrant and diffuse abdominal discomfort. There is associated anorexia, and he has had multiple episodes of vomiting and diarrhea throughout. No syncope, no chest pain, no fever. No other family members have similar illness. Reportedly the patient's last colonoscopy about 4 years ago was unremarkable.  He has had his gallbladder removed in the interval. Past Medical History:  Diagnosis Date  . Cancer ([place])   . Hypertension   . Renal disorder    CRF    Patient Active Problem List   Diagnosis Date Noted  . Gout 08/08/2015  . Acute pancreatitis 08/02/2015  . PRURITUS 03/05/2010  . Anemia of chronic renal failure, stage 4 (severe) ([place]) 01/18/2010  . Liver abscess via hepatic artery 2011, 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 01/18/2010  . HEMATURIA, HX OF 01/18/2010  . GASTRITIS 06/27/2009  . Calculus of gallbladder and bile duct 06/21/2009  . AKI (acute kidney injury) ([place]) 06/19/2009  . LOSS OF WEIGHT 06/19/2009  . ABDOMINAL PAIN, GENERALIZED 06/19/2009  . Pure hyperglyceridemia 06/18/2009  . HYPERTENSION, BENIGN ESSENTIAL 06/18/2009    Past Surgical History:  Procedure Laterality Date  . AV FISTULA PLACEMENT Left 05/04/2019   Procedure: Arteriovenous (Av) Fistula Creation Left Arm;  Surgeon: Rosetta Posner, MD;  Location: [facility];  Service: Vascular;  Laterality:  Left;  . CHOLECYSTECTOMY    . EYE SURGERY Bilateral    cataract        Home Medications    Prior to Admission medications   Medication Sig Start Date End Date Taking? Authorizing Provider  amLODipine (NORVASC) 10 MG tablet Take 10 mg by mouth at bedtime.  05/15/14  Yes [provider]  labetalol (NORMODYNE) 100 MG tablet Take 100 mg by mouth 2 (two) times daily.   Yes [provider]  Omega-3 Fatty Acids (FISH OIL PO) Take 1,500 mg by mouth 2 (two) times a day.   Yes [provider]  tamsulosin (FLOMAX) 0.4 MG CAPS capsule Take 0.4 mg by mouth daily. 04/16/19  Yes [provider]    Family History Family History  Problem Relation Age of Onset  . Liver disease Father   . Kidney disease Father   . Heart disease Father        before age 73  . Heart disease Mother        before age 18    Social History Social History   Tobacco Use  . Smoking status: Former Smoker    Quit date: 1956    Years since quitting: 64.7  . Smokeless tobacco: Former Systems developer    Quit date: 11/03/1954  Substance Use Topics  . Alcohol use: No    Alcohol/week: 0.0 standard drinks  . Drug use: No     Allergies   Amoxicillin-pot clavulanate, Atorvastatin, and Lovastatin   Review of Systems Review of  Systems  Constitutional:       Per HPI, otherwise negative  HENT:       Per HPI, otherwise negative  Respiratory:       Per HPI, otherwise negative  Cardiovascular:       Per HPI, otherwise negative  Gastrointestinal: Positive for abdominal pain, diarrhea, nausea and vomiting.  Endocrine:       Negative aside from HPI  Genitourinary:       Neg aside from HPI   Musculoskeletal:       Per HPI, otherwise negative  Skin: Negative.   Neurological: Negative for syncope.     Physical Exam Updated Vital Signs BP (!) 157/84   Pulse 84   Temp 98.3 F (36.8 C) (Oral)   Resp 18   SpO2 99%   Physical Exam Vitals signs and nursing note reviewed.  Constitutional:       General: He is not in acute distress.    Appearance: He is well-developed.  HENT:     Head: Normocephalic and atraumatic.  Eyes:     Conjunctiva/sclera: Conjunctivae normal.  Cardiovascular:     Rate and Rhythm: Normal rate and regular rhythm.  Pulmonary:     Effort: Pulmonary effort is normal. No respiratory distress.     Breath sounds: No stridor.  Abdominal:     General: There is no distension.     Tenderness: There is abdominal tenderness in the left lower quadrant. There is guarding.  Skin:    General: Skin is warm and dry.  Neurological:     Mental Status: He is alert and oriented to person, place, and time.      ED Treatments / Results  Labs (all labs ordered are listed, but only abnormal results are displayed) Labs Reviewed  COMPREHENSIVE METABOLIC PANEL - Abnormal; Notable for the following components:      Result Value   CO2 20 (*)    BUN 70 (*)    Creatinine, Ser 3.82 (*)    Calcium 8.4 (*)    AST 13 (*)    GFR calc non Af Amer 14 (*)    GFR calc Af Amer 16 (*)    All other components within normal limits  CBC - Abnormal; Notable for the following components:   RBC 3.51 (*)    Hemoglobin 10.8 (*)    HCT 34.1 (*)    Platelets 142 (*)    All other components within normal limits  URINALYSIS, ROUTINE W REFLEX MICROSCOPIC - Abnormal; Notable for the following components:   Hgb urine dipstick MODERATE (*)    Protein, ur 100 (*)    All other components within normal limits  LIPASE, BLOOD    EKG None  Radiology Ct Abdomen Pelvis Wo Contrast  Result Date: 07/28/2019 CLINICAL DATA:  Abdominal pain and diarrhea for 2 days. EXAM: CT ABDOMEN AND PELVIS WITHOUT CONTRAST TECHNIQUE: Multidetector CT imaging of the abdomen and pelvis was performed following the standard protocol without IV contrast. COMPARISON:  08/05/2015 FINDINGS: Lower chest: No acute abnormality. Hepatobiliary: Liver normal in size. Subcentimeter low-attenuation lesion at the dome  consistent with a cyst. No other liver masses or lesions. There is intrahepatic biliary air extending into the common bile duct, similar to the prior CT scan. Status post cholecystectomy. Pancreas: No mass or inflammation. Some biliary air extends into the pancreatic duct. No duct dilation. Spleen: Normal in size without focal abnormality. Adrenals/Urinary Tract: No adrenal masses. Bilateral renal cortical thinning. Right renal masses, largest posterolateral  midpole measuring 3.3 cm. These are consistent with cysts. No collecting system stones. No hydronephrosis. Ureters normal in course and in caliber. Bladder is unremarkable. Stomach/Bowel: There is colonic wall thickening with adjacent inflammation mesenteric vascular prominence, extending from the lower ascending colon to the central transverse colon, but also noted along the distal descending colon and sigmoid colon to its midportion. There are multiple colonic diverticula mostly along the sigmoid. However, the extent of the colonic abnormalities is consistent with a fairly diffuse infectious or inflammatory colitis and not diverticulitis. No extraluminal air. No evidence of an abscess. Stomach is unremarkable. Small bowel is normal in caliber. No small bowel wall thickening. Multiple subcentimeter mesenteric lymph nodes are noted. Vascular/Lymphatic: Numerous subcentimeter mesenteric lymph nodes. No enlarged lymph nodes. Aortic atherosclerosis. Reproductive: Unremarkable. Other: No ascites.  No hernia. Musculoskeletal: No fracture or acute finding. No osteoblastic or osteolytic lesions. IMPRESSION: 1. Infectious or inflammatory colitis, which involves the ascending colon and right transverse colon as well as the distal descending colon and sigmoid colon to its midportion. There is associated mesenteric congestion and numerous subcentimeter mesenteric lymph nodes. No extraluminal air. No abscess. 2. No other acute abnormality within the abdomen or pelvis. 3.  Aortic atherosclerosis. Electronically Signed   By: Lajean Manes M.D.   On: 07/28/2019 19:03    Procedures Procedures (including critical care time)  Medications Ordered in ED Medications  0.9 %  sodium chloride infusion (has no administration in time range)  iohexol (OMNIPAQUE) 300 MG/ML solution 30 mL (30 mLs Oral Contrast Given 07/28/19 1658)  ciprofloxacin (CIPRO) IVPB 400 mg (400 mg Intravenous New Bag/Given 07/28/19 1948)  metroNIDAZOLE (FLAGYL) IVPB 500 mg (500 mg Intravenous New Bag/Given 07/28/19 1948)     Initial Impression / Assessment and Plan / ED Course  I have reviewed the triage vital signs and the nursing notes.  Pertinent labs & imaging results that were available during my care of the patient were reviewed by me and considered in my medical decision making (see chart for details).    On repeat exam patient is in similar condition. I discussed initial findings with him and his daughter. Findings most notable for demonstration of colitis. No evidence for abscess, nor perforation.    9:02 PM Patient has tolerated ciprofloxacin, Flagyl, without complication, is awake, alert, hemodynamically unremarkable. Patient has a preference for discharge, will follow-up with his GI team. Absent evidence for bacteremia, sepsis, patient will be discharged with ongoing antibiotic therapy for his colitis.   Final Clinical Impressions(s) / ED Diagnoses   Final diagnoses:  Colitis    ED Discharge Orders    None       [name], MD 07/28/19 2106

## 2019-08-08 ENCOUNTER — Inpatient Hospital Stay (HOSPITAL_COMMUNITY)
Admission: EM | Admit: 2019-08-08 | Discharge: 2019-08-12 | DRG: 392 | Disposition: A | Discharge status: Home / Self Care | Payer: Medicare Other | Attending: Internal Medicine | Admitting: Internal Medicine

## 2019-08-08 ENCOUNTER — Other Ambulatory Visit: Payer: Self-pay

## 2019-08-08 ENCOUNTER — Encounter (HOSPITAL_COMMUNITY): Payer: Self-pay

## 2019-08-08 ENCOUNTER — Emergency Department (HOSPITAL_COMMUNITY): Payer: Medicare Other

## 2019-08-08 DIAGNOSIS — Z87891 Personal history of nicotine dependence: Secondary | ICD-10-CM | POA: Diagnosis not present

## 2019-08-08 DIAGNOSIS — K5730 Diverticulosis of large intestine without perforation or abscess without bleeding: Secondary | ICD-10-CM | POA: Diagnosis present

## 2019-08-08 DIAGNOSIS — K635 Polyp of colon: Secondary | ICD-10-CM | POA: Diagnosis present

## 2019-08-08 DIAGNOSIS — Z681 Body mass index (BMI) 19 or less, adult: Secondary | ICD-10-CM | POA: Diagnosis not present

## 2019-08-08 DIAGNOSIS — K529 Noninfective gastroenteritis and colitis, unspecified: Secondary | ICD-10-CM | POA: Diagnosis present

## 2019-08-08 DIAGNOSIS — R634 Abnormal weight loss: Secondary | ICD-10-CM | POA: Diagnosis present

## 2019-08-08 DIAGNOSIS — Z8249 Family history of ischemic heart disease and other diseases of the circulatory system: Secondary | ICD-10-CM | POA: Diagnosis not present

## 2019-08-08 DIAGNOSIS — Z20828 Contact with and (suspected) exposure to other viral communicable diseases: Secondary | ICD-10-CM | POA: Diagnosis present

## 2019-08-08 DIAGNOSIS — E870 Hyperosmolality and hypernatremia: Secondary | ICD-10-CM | POA: Diagnosis not present

## 2019-08-08 DIAGNOSIS — Z841 Family history of disorders of kidney and ureter: Secondary | ICD-10-CM | POA: Diagnosis not present

## 2019-08-08 DIAGNOSIS — Z79899 Other long term (current) drug therapy: Secondary | ICD-10-CM

## 2019-08-08 DIAGNOSIS — E860 Dehydration: Secondary | ICD-10-CM | POA: Diagnosis present

## 2019-08-08 DIAGNOSIS — A09 Infectious gastroenteritis and colitis, unspecified: Secondary | ICD-10-CM | POA: Diagnosis present

## 2019-08-08 DIAGNOSIS — E876 Hypokalemia: Secondary | ICD-10-CM | POA: Diagnosis not present

## 2019-08-08 DIAGNOSIS — N185 Chronic kidney disease, stage 5: Secondary | ICD-10-CM | POA: Diagnosis present

## 2019-08-08 DIAGNOSIS — N179 Acute kidney failure, unspecified: Secondary | ICD-10-CM | POA: Diagnosis present

## 2019-08-08 DIAGNOSIS — I120 Hypertensive chronic kidney disease with stage 5 chronic kidney disease or end stage renal disease: Secondary | ICD-10-CM | POA: Diagnosis present

## 2019-08-08 DIAGNOSIS — R197 Diarrhea, unspecified: Secondary | ICD-10-CM | POA: Diagnosis not present

## 2019-08-08 LAB — COMPREHENSIVE METABOLIC PANEL
ALT: 14 U/L (ref 0–44)
AST: 14 U/L — ABNORMAL LOW (ref 15–41)
Albumin: 2.9 g/dL — ABNORMAL LOW (ref 3.5–5.0)
Alkaline Phosphatase: 45 U/L (ref 38–126)
Anion gap: 8 (ref 5–15)
BUN: 57 mg/dL — ABNORMAL HIGH (ref 8–23)
CO2: 16 mmol/L — ABNORMAL LOW (ref 22–32)
Calcium: 7.8 mg/dL — ABNORMAL LOW (ref 8.9–10.3)
Chloride: 111 mmol/L (ref 98–111)
Creatinine, Ser: 4.43 mg/dL — ABNORMAL HIGH (ref 0.61–1.24)
GFR calc Af Amer: 13 mL/min — ABNORMAL LOW (ref >60)
GFR calc non Af Amer: 11 mL/min — ABNORMAL LOW (ref >60)
Glucose, Bld: 95 mg/dL (ref 70–99)
Potassium: 3.6 mmol/L (ref 3.5–5.1)
Sodium: 135 mmol/L (ref 135–145)
Total Bilirubin: 0.4 mg/dL (ref 0.3–1.2)
Total Protein: 5.7 g/dL — ABNORMAL LOW (ref 6.5–8.1)

## 2019-08-08 LAB — CBC
HCT: 27.7 % — ABNORMAL LOW (ref 39.0–52.0)
HCT: 28.2 % — ABNORMAL LOW (ref 39.0–52.0)
Hemoglobin: 9.1 g/dL — ABNORMAL LOW (ref 13.0–17.0)
Hemoglobin: 9.4 g/dL — ABNORMAL LOW (ref 13.0–17.0)
MCH: 30.6 pg (ref 26.0–34.0)
MCH: 30.7 pg (ref 26.0–34.0)
MCHC: 32.9 g/dL (ref 30.0–36.0)
MCHC: 33.3 g/dL (ref 30.0–36.0)
MCV: 93.3 fL (ref 80.0–100.0)
MCV: 92.2 fL (ref 80.0–100.0)
Platelets: 231 10*3/uL (ref 150–400)
Platelets: 265 10*3/uL (ref 150–400)
RBC: 2.97 MIL/uL — ABNORMAL LOW (ref 4.22–5.81)
RBC: 3.06 MIL/uL — ABNORMAL LOW (ref 4.22–5.81)
RDW: 14.3 % (ref 11.5–15.5)
RDW: 14.3 % (ref 11.5–15.5)
WBC: 6.7 10*3/uL (ref 4.0–10.5)
WBC: 5.4 10*3/uL (ref 4.0–10.5)
nRBC: 0 % (ref 0.0–0.2)
nRBC: 0 % (ref 0.0–0.2)

## 2019-08-08 LAB — C DIFFICILE QUICK SCREEN W PCR REFLEX
C Diff antigen: NEGATIVE
C Diff interpretation: NOT DETECTED
C Diff toxin: NEGATIVE

## 2019-08-08 LAB — SARS CORONAVIRUS 2 BY RT PCR (HOSPITAL ORDER, PERFORMED IN ~~LOC~~ HOSPITAL LAB): SARS Coronavirus 2: NEGATIVE

## 2019-08-08 LAB — TYPE AND SCREEN
ABO/RH(D): A POS
Antibody Screen: NEGATIVE

## 2019-08-08 LAB — POC OCCULT BLOOD, ED: Fecal Occult Bld: POSITIVE — AB

## 2019-08-08 MED ORDER — SODIUM CHLORIDE 0.9% FLUSH
3.0000 mL | Freq: Two times a day (BID) | INTRAVENOUS | Status: DC
  Administered 2019-08-08 – 2019-08-12 (×3): 3 mL via INTRAVENOUS

## 2019-08-08 MED ORDER — SODIUM CHLORIDE 0.9 % IV SOLN: 1000.0000 mL | INTRAVENOUS | Status: DC

## 2019-08-08 MED ORDER — FLEET ENEMA 7-19 GM/118ML RE ENEM: 1.0000 | ENEMA | Freq: Once | RECTAL | Status: DC | PRN

## 2019-08-08 MED ORDER — SODIUM CHLORIDE 0.9 % IV BOLUS (SEPSIS)
500.0000 mL | Freq: Once | INTRAVENOUS | Status: AC
  Administered 2019-08-08: 500 mL via INTRAVENOUS

## 2019-08-08 MED ORDER — MAGNESIUM HYDROXIDE 400 MG/5ML PO SUSP: 30.0000 mL | Freq: Every day | ORAL | Status: DC | PRN

## 2019-08-08 MED ORDER — SODIUM CHLORIDE 0.9 % IV SOLN
2.0000 g | INTRAVENOUS | Status: DC
  Administered 2019-08-08 – 2019-08-11 (×4): 2 g via INTRAVENOUS
  Filled 2019-08-08 (×3): qty 2
  Filled 2019-08-08: qty 20
  Filled 2019-08-08: qty 2

## 2019-08-08 MED ORDER — ONDANSETRON HCL 4 MG PO TABS: 4.0000 mg | ORAL_TABLET | Freq: Four times a day (QID) | ORAL | Status: DC | PRN

## 2019-08-08 MED ORDER — SODIUM CHLORIDE 0.9 % IV SOLN
INTRAVENOUS | Status: DC
  Administered 2019-08-08 – 2019-08-09 (×2): via INTRAVENOUS

## 2019-08-08 MED ORDER — SODIUM CHLORIDE 0.9 % IV SOLN
500.0000 mg | INTRAVENOUS | Status: DC
  Administered 2019-08-08 – 2019-08-11 (×4): 500 mg via INTRAVENOUS
  Filled 2019-08-08 (×5): qty 500

## 2019-08-08 MED ORDER — ACETAMINOPHEN 325 MG PO TABS: 650.0000 mg | ORAL_TABLET | Freq: Four times a day (QID) | ORAL | Status: DC | PRN

## 2019-08-08 MED ORDER — SORBITOL 70 % SOLN: 30.0000 mL | Freq: Every day | Status: DC | PRN

## 2019-08-08 MED ORDER — ACETAMINOPHEN 650 MG RE SUPP: 650.0000 mg | Freq: Four times a day (QID) | RECTAL | Status: DC | PRN

## 2019-08-08 MED ORDER — HEPARIN SODIUM (PORCINE) 5000 UNIT/ML IJ SOLN: 5000.0000 [IU] | Freq: Three times a day (TID) | INTRAMUSCULAR | Status: DC

## 2019-08-08 MED ORDER — METRONIDAZOLE IN NACL 5-0.79 MG/ML-% IV SOLN
500.0000 mg | Freq: Three times a day (TID) | INTRAVENOUS | Status: DC
  Administered 2019-08-09 – 2019-08-12 (×11): 500 mg via INTRAVENOUS
  Filled 2019-08-08 (×11): qty 100

## 2019-08-08 MED ORDER — ONDANSETRON HCL 4 MG/2ML IJ SOLN: 4.0000 mg | Freq: Four times a day (QID) | INTRAMUSCULAR | Status: DC | PRN

## 2019-08-08 NOTE — ED Notes (Signed)
Patient called out to have BM. Patient placed on bed pan.

## 2019-08-08 NOTE — ED Notes (Signed)
Patient transported to CT 

## 2019-08-08 NOTE — ED Provider Notes (Addendum)
[facility] DEPT Provider Note   CSN: [id number] Arrival date & time: 08/08/19  1037     History   Chief Complaint Chief Complaint  Patient presents with  . Rectal Bleeding    HPI Pedro Young is a 85 y.o. male.     HPI Patient was seen in the emergency department 10 days ago.  At that time he had had 4 days of abdominal pain nausea and vomiting.  He was having fairly severe pain in the left lower quadrant.  Patient was evaluated with CT scan and found to have fairly significant diffuse colitis.  He was treated with Cipro Flagyl.  Patient reports in the meantime, his symptoms have gotten worse.  He reports he is having up to 5 episodes of bloody diarrhea per day.  He reports he is having incontinence of stool.  He continues to have quite a bit of pain in his left lower abdomen.  He has not developed a fever in the interim.  He is not vomiting.  Patient reports he does feel very weak.  He has not had any passing out episodes. Past Medical History:  Diagnosis Date  . Cancer ([place])   . Hypertension   . Renal disorder    CRF    Patient Active Problem List   Diagnosis Date Noted  . Gout 08/08/2015  . Acute pancreatitis 08/02/2015  . PRURITUS 03/05/2010  . Anemia of chronic renal failure, stage 4 (severe) ([place]) 01/18/2010  . Liver abscess via hepatic artery 2011, 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 01/18/2010  . HEMATURIA, HX OF 01/18/2010  . GASTRITIS 06/27/2009  . Calculus of gallbladder and bile duct 06/21/2009  . AKI (acute kidney injury) ([place]) 06/19/2009  . LOSS OF WEIGHT 06/19/2009  . ABDOMINAL PAIN, GENERALIZED 06/19/2009  . Pure hyperglyceridemia 06/18/2009  . HYPERTENSION, BENIGN ESSENTIAL 06/18/2009    Past Surgical History:  Procedure Laterality Date  . AV FISTULA PLACEMENT Left 05/04/2019   Procedure: Arteriovenous (Av) Fistula Creation Left Arm;  Surgeon: Rosetta Posner, MD;  Location: [facility];  Service: Vascular;  Laterality: Left;  .  CHOLECYSTECTOMY    . EYE SURGERY Bilateral    cataract        Home Medications    Prior to Admission medications   Medication Sig Start Date End Date Taking? Authorizing Provider  amLODipine (NORVASC) 10 MG tablet Take 10 mg by mouth at bedtime.  05/15/14  Yes [provider]  labetalol (NORMODYNE) 100 MG tablet Take 100 mg by mouth 2 (two) times daily.   Yes [provider]  Omega-3 Fatty Acids (FISH OIL PO) Take 1,500 mg by mouth daily.    Yes [provider]  ciprofloxacin (CIPRO) 500 MG tablet Take 1 tablet (500 mg total) by mouth 2 (two) times daily. 07/28/19   [name], MD  metroNIDAZOLE (FLAGYL) 500 MG tablet Take 1 tablet (500 mg total) by mouth 2 (two) times daily. 07/28/19   [name], MD    Family History Family History  Problem Relation Age of Onset  . Liver disease Father   . Kidney disease Father   . Heart disease Father        before age 81  . Heart disease Mother        before age 25    Social History Social History   Tobacco Use  . Smoking status: Former Smoker    Quit date: 1956    Years since quitting: 64.8  .  Smokeless tobacco: Former Systems developer    Quit date: 11/03/1954  Substance Use Topics  . Alcohol use: No    Alcohol/week: 0.0 standard drinks  . Drug use: No     Allergies   Amoxicillin-pot clavulanate, Atorvastatin, and Lovastatin   Review of Systems Review of Systems 10 Systems reviewed and are negative for acute change except as noted in the HPI.   Physical Exam Updated Vital Signs BP 122/68   Pulse 72   Temp 98.3 F (36.8 C) (Oral)   Resp 19   Wt 57 kg   SpO2 100%   BMI 19.68 kg/m   Physical Exam Constitutional:      Appearance: Normal appearance.  HENT:     Head: Normocephalic and atraumatic.     Mouth/Throat:     Pharynx: Oropharynx is clear.  Eyes:     Extraocular Movements: Extraocular movements intact.  Cardiovascular:     Rate and Rhythm: Normal rate.  Pulmonary:     Effort:  Pulmonary effort is normal.     Breath sounds: Normal breath sounds.  Abdominal:     Comments: Soft without guarding.  Moderate diffuse lower abdominal tenderness to palpation.  Genitourinary:    Comments: No melena Musculoskeletal: Normal range of motion.        General: No signs of injury.  Skin:    General: Skin is warm and dry.  Neurological:     General: No focal deficit present.     Mental Status: He is alert.     Coordination: Coordination normal.  Psychiatric:        Mood and Affect: Mood normal.      ED Treatments / Results  Labs (all labs ordered are listed, but only abnormal results are displayed) Labs Reviewed  COMPREHENSIVE METABOLIC PANEL - Abnormal; Notable for the following components:      Result Value   CO2 16 (*)    BUN 57 (*)    Creatinine, Ser 4.43 (*)    Calcium 7.8 (*)    Total Protein 5.7 (*)    Albumin 2.9 (*)    AST 14 (*)    GFR calc non Af Amer 11 (*)    GFR calc Af Amer 13 (*)    All other components within normal limits  CBC - Abnormal; Notable for the following components:   RBC 2.97 (*)    Hemoglobin 9.1 (*)    HCT 27.7 (*)    All other components within normal limits  POC OCCULT BLOOD, ED - Abnormal; Notable for the following components:   Fecal Occult Bld POSITIVE (*)    All other components within normal limits  GI PATHOGEN PANEL BY PCR, STOOL  C DIFFICILE QUICK SCREEN W PCR REFLEX  SARS CORONAVIRUS 2 (HOSPITAL ORDER, [facility])  POC OCCULT BLOOD, ED  TYPE AND SCREEN    EKG None  Radiology Ct Abdomen Pelvis Wo Contrast  Result Date: 08/08/2019 CLINICAL DATA:  Pt states that he continues to have blood in stool. Pt states that he has taken medication as prescribed without relief. Pt describes it as "a lot". Pt talks about pain in stomach with walking. Pt has weakness. Pt.c/o mid abdomi.*comment was truncated*Abd distension EXAM: CT ABDOMEN AND PELVIS WITHOUT CONTRAST TECHNIQUE: Multidetector CT  imaging of the abdomen and pelvis was performed following the standard protocol without IV contrast. COMPARISON:  CT abdomen 07/28/2019 FINDINGS: Lower chest: Mild pleural thickening lung bases stable. No pleural effusion Hepatobiliary: Pneumobilia within  the LEFT hepatic lobe again noted. Postcholecystectomy. Gas within the common bile duct. Pancreas: Pancreas is normal. No ductal dilatation. No pancreatic inflammation. Spleen: Normal spleen Adrenals/urinary tract: Adrenal glands normal. Mixed density renal cysts can of the fully characterize on noncontrast exam. No interval change. No obstructive uropathy. Bladder normal. Stomach/Bowel: Stomach duodenum are normal. The small bowel is normal. The terminal ileum is normal. Appendix not identified. There is submucosal edema bowel wall thickening of the ascending, transverse and sigmoid colon. The rectum appear spared. These findings are very similar to comparison CT 07/28/2019. No improvement. No oral contrast administered makes comparison difficult. No pneumatosis or portal venous gas. Venous congestion in the colonic mesentery again noted. Vascular/Lymphatic: Abdominal aorta is normal caliber with atherosclerotic calcification. There is no retroperitoneal or periportal lymphadenopathy. No pelvic lymphadenopathy. Reproductive: Prostate normal Other: No free fluid. Musculoskeletal: No aggressive osseous lesion. IMPRESSION: 1. Long segment of bowel thickening involving the ascending/ transverse and sigmoid colon. Findings are very similar to comparison CT 07/28/2019 and consistent with pancolitis. Recommend correlation for C difficile colitis or other infectious etiologies. Would also consider inflammatory bowel disease. No IV or oral contrast administered. 2. No bowel perforation.  Pneumatosis intestinalis. 3. Pneumobilia related to prior sphincterotomy again noted. Electronically Signed   By: Suzy Bouchard M.D.   On: 08/08/2019 13:53    Procedures Procedures  (including critical care time)  Medications Ordered in ED Medications  sodium chloride 0.9 % bolus 500 mL (has no administration in time range)    Followed by  0.9 %  sodium chloride infusion (has no administration in time range)     Initial Impression / Assessment and Plan / ED Course  I have reviewed the triage vital signs and the nursing notes.  Pertinent labs & imaging results that were available during my care of the patient were reviewed by me and considered in my medical decision making (see chart for details).       Patient started treatment approximately 10 days ago with Flagyl and Cipro for extensive colitis identified on CT scan.  Patient has had increasing diarrhea which is nonbloody.  He has had general weakness.  Patient has had incontinence of stool.  He has had persistent lower abdominal pain.  Repeat CT does not show any improvement.  Patient has worsening acute on chronic kidney injury.  Will plan for admission for hydration and further diagnostic evaluation for intractable diarrhea with extensive colitis.  Stool for C. difficile and PCR GI panel have been ordered.  Still pending.  Final Clinical Impressions(s) / ED Diagnoses   Final diagnoses:  Bloody diarrhea  AKI (acute kidney injury) [facility])  Dehydration  Colitis    ED Discharge Orders    None       Charlesetta Shanks, MD 08/08/19 1530    Charlesetta Shanks, MD 09/16/19 1201

## 2019-08-08 NOTE — ED Triage Notes (Signed)
Pt states that he continues to have blood in stool. Pt states that he has taken medication as prescribed without relief. Pt describes it as "a lot". Pt talks about pain in stomach with walking. Pt has weakness.

## 2019-08-08 NOTE — ED Notes (Signed)
PT c/o weakness while standing

## 2019-08-08 NOTE — H&P (Signed)
[facility] History and Physical  Pedro Young WJX:[id number] DOB: [birth year] DOA: 08/08/2019 Referring physician: ED PCP: Gregor Hams, FNP  Chief Complaint: Diarrhea ------------------------------------------------------------------------------------------------------ Assessment/Plan: Active Problems:   Acute gastroenteritis  Acute gastroenteritis Bright red blood per rectum -Non-remitting symptoms for last 12 days despite 10 days of oral Cipro and Flagyl. -Also associated with BRBPR for last 3 days. -Suspect noninfectious cause because of absence of fever, absence of leukocytosis and persistence of symptoms despite antibiotics. -Inflammatory colitis versus ischemic colitis. -For now, will also continue empiric antibiotic IV Rocephin and IV Flagyl till the stool studies are back.  I will also start on azithromycin 5 mg daily to cover Campylobacter. -Start the patient on clear liquid diet.  Keep n.p.o. after midnight -We will get GI involved in the morning.  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  Patient already has a fistula in place but has not been started on dialysis. -No need of dialysis at this time.  We will keep the patient hydrated with normal saline.  Reassess the need tomorrow. -Monitor creatinine and serum bicarb level. -Nephrology consult if needed.   Hypertension -Home meds include amlodipine and labetalol. -Keep him on hold for now.  Resume as blood pressure picks up.   Mobility: Encourage ambulation Diet: Clear liquid diet for now, n.p.o. after midnight DVT prophylaxis:  Will avoid heparin or Lovenox because of bleeding.  SCD boots.  Code Status:  Full code Family Communication:  Wife at bedside Disposition Plan:  Anticipate home in next 2 to 3 days  ----------------------------------------------------------------------------------------------------- History of Present Illness: Pedro Young is a 85 y.o. male with past medical history of hypertension, CKD stage 4-5 who presented to the ED with complaint of non-remitting diarrhea for last 10 days. Patient first came to the ED on 9/24 with 2 days history of diarrhea. CT scan of abdomen and pelvis showed fairly significant diffuse colitis.  Patient was hemodynamically stable and was discharged on ciprofloxacin and Flagyl.  Patient and family state that he took the antibiotics as advised however continued to have diarrhea.  For last 3 days he is also seen blood mixed in the stool. Associated with constant abdominal pain, no fever. Not on a blood thinner. In the ED, patient was afebrile, blood pressure in normal range, heart rate in 70s, oxygen saturation 100% on room air. Labs showed WBC count 6.7, hemoglobin 9.1 compared to 10.8 on 9/24, platelet count 231. Sodium 135, potassium 3.6, serum bicarb 16 compared to 20 on 9/24.  BUN/creatinine 57/4.43 compared to 70/3.82 on 9/24, albumin 2.9. COVID-19 test negative. Urinalysis showed moderate amount of blood, 100 protein, no bacteria and 0-5 WBCs. Fecal occult blood test positive. C. difficile antigen and toxin negative. CT abdomen and pelvis today showed 1. Long segment of bowel thickening involving the ascending/transverse and sigmoid colon. Findings are very similar to comparison CT 07/28/2019 and consistent with pancolitis.  2. No bowel perforation.  Pneumatosis intestinalis. 3. Pneumobilia related to prior sphincterotomy again noted.  Hospital consultation was called for inpatient admission and management. Prior to onset of diarrhea, patient was physically active and overall healthy for his age.  He has been less active since diarrhea started.  Review of Systems:  All systems were reviewed and were negative unless otherwise mentioned in the HPI   Past medical history: Past Medical History:  Diagnosis Date  . Cancer ([place])   . Hypertension   . Renal disorder  CRF     Past surgical history: Past Surgical History:  Procedure Laterality Date  . AV FISTULA PLACEMENT Left 05/04/2019   Procedure: Arteriovenous (Av) Fistula Creation Left Arm;  Surgeon: Rosetta Posner, MD;  Location: [facility];  Service: Vascular;  Laterality: Left;  . CHOLECYSTECTOMY    . EYE SURGERY Bilateral    cataract    Social History:  reports that he quit smoking about 64 years ago. He quit smokeless tobacco use about 64 years ago. He reports that he does not drink alcohol or use drugs.  Allergies:  Allergies  Allergen Reactions  . Amoxicillin-Pot Clavulanate Other (See Comments)    Did it involve swelling of the face/tongue/throat, SOB, or low BP? Unknown Did it involve sudden or severe rash/hives, skin peeling, or any reaction on the inside of your mouth or nose? Unknown Did you need to seek medical attention at a hospital or doctor's office? Unknown When did it last happen?patient is unfamiliar with this allergy If all above answers are "NO", may proceed with cephalosporin use.   . Atorvastatin     REACTION: arthralgia  . Lovastatin     REACTION: arthralgia    Family history:  Family History  Problem Relation Age of Onset  . Liver disease Father   . Kidney disease Father   . Heart disease Father        before age 59  . Heart disease Mother        before age 83     Home Meds: Prior to Admission medications   Medication Sig Start Date End Date Taking? Authorizing Provider  amLODipine (NORVASC) 10 MG tablet Take 10 mg by mouth at bedtime.  05/15/14  Yes [provider]  labetalol (NORMODYNE) 100 MG tablet Take 100 mg by mouth 2 (two) times daily.   Yes [provider]  Omega-3 Fatty Acids (FISH OIL PO) Take 1,500 mg by mouth daily.    Yes [provider]  ciprofloxacin (CIPRO) 500 MG tablet Take 1 tablet (500 mg total) by mouth 2 (two) times daily. 07/28/19   [name], MD  metroNIDAZOLE (FLAGYL) 500 MG tablet Take 1 tablet (500 mg  total) by mouth 2 (two) times daily. 07/28/19   [name], MD    Physical Exam: Vitals:   08/08/19 1430 08/08/19 1445 08/08/19 1500 08/08/19 1600  BP: 121/64  122/68 125/65  Pulse: 72 74 72 75  Resp: 18 12 19 14   Temp:      TempSrc:      SpO2: 100% 100% 100% 100%  Weight:       Wt Readings from Last 3 Encounters:  08/08/19 57 kg  06/14/19 57.3 kg  05/24/19 54.4 kg   Body mass index is 19.68 kg/m.  General exam: Appears calm and comfortable.  Skin: No rashes, lesions or ulcers. HEENT: Atraumatic, normocephalic, supple neck, no obvious bleeding Lungs: Clear to auscultation bilaterally CVS: Regular rate and rhythm, no murmur GI/Abd soft, nondistended, mild to moderate tenderness on the lower abdomen, bowel sound present CNS: Alert, awake, oriented x3 Psychiatry: Mood appropriate, judgment and insight clear Extremities: No pedal edema, no calf tenderness  Labs on Admission:   CBC: Recent Labs  Lab 08/08/19 1132 08/08/19 1617  WBC 6.7 5.4  HGB 9.1* 9.4*  HCT 27.7* 28.2*  MCV 93.3 92.2  PLT 231 546    Basic Metabolic Panel: Recent Labs  Lab 08/08/19 1132  NA 135  K 3.6  CL 111  CO2 16*  GLUCOSE 95  BUN 57*  CREATININE 4.43*  CALCIUM 7.8*    Liver Function Tests: Recent Labs  Lab 08/08/19 1132  AST 14*  ALT 14  ALKPHOS 45  BILITOT 0.4  PROT 5.7*  ALBUMIN 2.9*   No results for input(s): LIPASE, AMYLASE in the last 168 hours. No results for input(s): AMMONIA in the last 168 hours.  Cardiac Enzymes: No results for input(s): CKTOTAL, CKMB, CKMBINDEX, TROPONINI in the last 168 hours.  BNP (last 3 results) No results for input(s): BNP in the last 8760 hours.  ProBNP (last 3 results) No results for input(s): PROBNP in the last 8760 hours.  CBG: No results for input(s): GLUCAP in the last 168 hours.  Lipase     Component Value Date/Time   LIPASE 25 07/28/2019 1557     Urinalysis    Component Value Date/Time   COLORURINE YELLOW  07/28/2019 1558   APPEARANCEUR CLEAR 07/28/2019 1558   LABSPEC 1.013 07/28/2019 1558   PHURINE 5.0 07/28/2019 1558   GLUCOSEU NEGATIVE 07/28/2019 1558   HGBUR MODERATE (A) 07/28/2019 1558   BILIRUBINUR NEGATIVE 07/28/2019 1558   KETONESUR NEGATIVE 07/28/2019 1558   PROTEINUR 100 (A) 07/28/2019 1558   UROBILINOGEN 4.0 (H) 08/04/2015 0232   NITRITE NEGATIVE 07/28/2019 1558   LEUKOCYTESUR NEGATIVE 07/28/2019 1558     Drugs of Abuse  No results found for: LABOPIA, COCAINSCRNUR, LABBENZ, AMPHETMU, THCU, LABBARB    Radiological Exams on Admission: Ct Abdomen Pelvis Wo Contrast  Result Date: 08/08/2019 CLINICAL DATA:  Pt states that he continues to have blood in stool. Pt states that he has taken medication as prescribed without relief. Pt describes it as "a lot". Pt talks about pain in stomach with walking. Pt has weakness. Pt.c/o mid abdomi.*comment was truncated*Abd distension EXAM: CT ABDOMEN AND PELVIS WITHOUT CONTRAST TECHNIQUE: Multidetector CT imaging of the abdomen and pelvis was performed following the standard protocol without IV contrast. COMPARISON:  CT abdomen 07/28/2019 FINDINGS: Lower chest: Mild pleural thickening lung bases stable. No pleural effusion Hepatobiliary: Pneumobilia within the LEFT hepatic lobe again noted. Postcholecystectomy. Gas within the common bile duct. Pancreas: Pancreas is normal. No ductal dilatation. No pancreatic inflammation. Spleen: Normal spleen Adrenals/urinary tract: Adrenal glands normal. Mixed density renal cysts can of the fully characterize on noncontrast exam. No interval change. No obstructive uropathy. Bladder normal. Stomach/Bowel: Stomach duodenum are normal. The small bowel is normal. The terminal ileum is normal. Appendix not identified. There is submucosal edema bowel wall thickening of the ascending, transverse and sigmoid colon. The rectum appear spared. These findings are very similar to comparison CT 07/28/2019. No improvement. No oral  contrast administered makes comparison difficult. No pneumatosis or portal venous gas. Venous congestion in the colonic mesentery again noted. Vascular/Lymphatic: Abdominal aorta is normal caliber with atherosclerotic calcification. There is no retroperitoneal or periportal lymphadenopathy. No pelvic lymphadenopathy. Reproductive: Prostate normal Other: No free fluid. Musculoskeletal: No aggressive osseous lesion. IMPRESSION: 1. Long segment of bowel thickening involving the ascending/ transverse and sigmoid colon. Findings are very similar to comparison CT 07/28/2019 and consistent with pancolitis. Recommend correlation for C difficile colitis or other infectious etiologies. Would also consider inflammatory bowel disease. No IV or oral contrast administered. 2. No bowel perforation.  Pneumatosis intestinalis. 3. Pneumobilia related to prior sphincterotomy again noted. Electronically Signed   By: Suzy Bouchard M.D.   On: 08/08/2019 13:53   ----------------------------------------------------------------------------------------------------------------------------------------------------------- Severity of Illness: The appropriate patient status for this patient is INPATIENT. Inpatient status is judged to be reasonable  and necessary in order to provide the required intensity of service to ensure the patient's safety. The patient's presenting symptoms, physical exam findings, and initial radiographic and laboratory data in the context of their chronic comorbidities is felt to place them at high risk for further clinical deterioration. Furthermore, it is not anticipated that the patient will be medically stable for discharge from the hospital within 2 midnights of admission. The following factors support the patient status of inpatient.   " The patient's presenting symptoms include intractable diarrhea, BRBPR, abdominal pain. " The worrisome physical exam findings include abdominal tenderness. " The initial  radiographic and laboratory data are worrisome because of pancolitis. " The chronic co-morbidities include CKD 4-5.   * I certify that at the point of admission it is my clinical judgment that the patient will require inpatient hospital care spanning beyond 2 midnights from the point of admission due to high intensity of service, high risk for further deterioration and high frequency of surveillance required.*   Signed, Terrilee Croak, MD [facility] 08/08/2019

## 2019-08-08 NOTE — ED Notes (Signed)
Family member at bedside agreeable to take pt belongings home, including clothing and pts personal wheelchair.

## 2019-08-09 LAB — BASIC METABOLIC PANEL
Anion gap: 13 (ref 5–15)
BUN: 49 mg/dL — ABNORMAL HIGH (ref 8–23)
CO2: 13 mmol/L — ABNORMAL LOW (ref 22–32)
Calcium: 7.3 mg/dL — ABNORMAL LOW (ref 8.9–10.3)
Chloride: 115 mmol/L — ABNORMAL HIGH (ref 98–111)
Creatinine, Ser: 3.72 mg/dL — ABNORMAL HIGH (ref 0.61–1.24)
GFR calc Af Amer: 16 mL/min — ABNORMAL LOW (ref >60)
GFR calc non Af Amer: 14 mL/min — ABNORMAL LOW (ref >60)
Glucose, Bld: 69 mg/dL — ABNORMAL LOW (ref 70–99)
Potassium: 3.5 mmol/L (ref 3.5–5.1)
Sodium: 141 mmol/L (ref 135–145)

## 2019-08-09 LAB — CBC
HCT: 29.2 % — ABNORMAL LOW (ref 39.0–52.0)
Hemoglobin: 9.4 g/dL — ABNORMAL LOW (ref 13.0–17.0)
MCH: 30.8 pg (ref 26.0–34.0)
MCHC: 32.2 g/dL (ref 30.0–36.0)
MCV: 95.7 fL (ref 80.0–100.0)
Platelets: 250 10*3/uL (ref 150–400)
RBC: 3.05 MIL/uL — ABNORMAL LOW (ref 4.22–5.81)
RDW: 14.6 % (ref 11.5–15.5)
WBC: 5.9 10*3/uL (ref 4.0–10.5)
nRBC: 0 % (ref 0.0–0.2)

## 2019-08-09 MED ORDER — SODIUM BICARBONATE-DEXTROSE 150-5 MEQ/L-% IV SOLN
150.0000 meq | INTRAVENOUS | Status: DC
  Administered 2019-08-09 – 2019-08-10 (×3): 150 meq via INTRAVENOUS
  Filled 2019-08-09 (×7): qty 1000

## 2019-08-09 MED ORDER — PEG-KCL-NACL-NASULF-NA ASC-C 100 G PO SOLR
0.5000 | Freq: Once | ORAL | Status: AC
  Administered 2019-08-10: 100 g via ORAL

## 2019-08-09 MED ORDER — PEG-KCL-NACL-NASULF-NA ASC-C 100 G PO SOLR: 1.0000 | Freq: Once | ORAL | Status: DC

## 2019-08-09 MED ORDER — PEG-KCL-NACL-NASULF-NA ASC-C 100 G PO SOLR
0.5000 | Freq: Once | ORAL | Status: AC
  Administered 2019-08-09: 100 g via ORAL
  Filled 2019-08-09: qty 1

## 2019-08-09 MED ORDER — POTASSIUM CHLORIDE 20 MEQ PO PACK
20.0000 meq | PACK | Freq: Every day | ORAL | Status: DC
  Administered 2019-08-09 – 2019-08-10 (×2): 20 meq via ORAL
  Filled 2019-08-09 (×2): qty 1

## 2019-08-09 NOTE — Consult Note (Addendum)
Referring Provider:  Dr. Pietro Cassis, [facility] Primary Care Physician:  Gregor Hams, FNP Primary Gastroenterologist:  Dr. Henrene Pastor  Reason for Consultation:  GI bleeding  HPI: Pedro Young is a 85 y.o. male with PMH of HTN, CKD stage 3-4, and remote history of pancreatitis related to gallstones show presented to the hospital with complaints of diarrhea and rectal bleeding.  The patient tells me this is been present for about 2 weeks, but his doctor entered the room halfway into our visit and indicated that maybe closer to 4 weeks that this is been occurring.  Symptoms were rather sudden in onset and he claims he was moving his bowels normally up until this began.  They report that he has had a lot of bleeding since this diarrhea has occurred.  CT scan abdomen and pelvis without contrast on 9/24 showed the following:  1. Infectious or inflammatory colitis, which involves the ascending colon and right transverse colon as well as the distal descending colon and sigmoid colon to its midportion. There is associated mesenteric congestion and numerous subcentimeter mesenteric lymph nodes. No extraluminal air. No abscess. 2. No other acute abnormality within the abdomen or pelvis. 3. Aortic atherosclerosis.  He was then discharged from the ED on cipro and flagyl, which he took as directed but symptoms did not improve/resolve.  He returned to the ED yesterday, 10/5.    CT scan of the abdomen and pelvis without contrast was repeated on 10/5 and showed the following:  IMPRESSION: 1. Long segment of bowel thickening involving the ascending/ transverse and sigmoid colon. Findings are very similar to comparison CT 07/28/2019 and consistent with pancolitis. Recommend correlation for C difficile colitis or other infectious etiologies. Would also consider inflammatory bowel disease. No IV or oral contrast administered. 2. No bowel perforation.  Pneumatosis intestinalis. 3. Pneumobilia related to prior  sphincterotomy again noted.  Cdiff quick scan was negative, but GI pathogen panel and stool culture are pending.  He is currently receiving azithromycin, rocephin, and flagyl.  He says that he feels about 75% improved since coming in yesterday.  Last colonoscopy by Dr. Sharlett Iles showed severe diverticulosis and one diminutive polyp in 06/2009.  Past Medical History:  Diagnosis Date  . Cancer ([place])   . Hypertension   . Renal disorder    CRF    Past Surgical History:  Procedure Laterality Date  . AV FISTULA PLACEMENT Left 05/04/2019   Procedure: Arteriovenous (Av) Fistula Creation Left Arm;  Surgeon: Rosetta Posner, MD;  Location: [facility];  Service: Vascular;  Laterality: Left;  . CHOLECYSTECTOMY    . EYE SURGERY Bilateral    cataract    Prior to Admission medications   Medication Sig Start Date End Date Taking? Authorizing Provider  amLODipine (NORVASC) 10 MG tablet Take 10 mg by mouth at bedtime.  05/15/14  Yes [provider]  labetalol (NORMODYNE) 100 MG tablet Take 100 mg by mouth 2 (two) times daily.   Yes [provider]  Omega-3 Fatty Acids (FISH OIL PO) Take 1,500 mg by mouth daily.    Yes [provider]  ciprofloxacin (CIPRO) 500 MG tablet Take 1 tablet (500 mg total) by mouth 2 (two) times daily. 07/28/19   [name], MD  metroNIDAZOLE (FLAGYL) 500 MG tablet Take 1 tablet (500 mg total) by mouth 2 (two) times daily. 07/28/19   [name], MD    Current Facility-Administered Medications  Medication Dose Route Frequency Provider Last Rate Last Dose  . 0.9 %  sodium chloride infusion  1,000 mL Intravenous Continuous Pfeiffer, Jeannie Done, MD      . 0.9 %  sodium chloride infusion   Intravenous Continuous Dahal, Marlowe Aschoff, MD 125 mL/hr at 08/08/19 1839    . acetaminophen (TYLENOL) tablet 650 mg  650 mg Oral Q6H PRN Dahal, Marlowe Aschoff, MD       Or  . acetaminophen (TYLENOL) suppository 650 mg  650 mg Rectal Q6H PRN Dahal, Marlowe Aschoff, MD      . azithromycin  (ZITHROMAX) 500 mg in sodium chloride 0.9 % 250 mL IVPB  500 mg Intravenous Q24H Dahal, Binaya, MD 250 mL/hr at 08/08/19 2255 500 mg at 08/08/19 2255  . cefTRIAXone (ROCEPHIN) 2 g in sodium chloride 0.9 % 100 mL IVPB  2 g Intravenous Q24H Dahal, Binaya, MD 200 mL/hr at 08/08/19 2101 2 g at 08/08/19 2101  . magnesium hydroxide (MILK OF MAGNESIA) suspension 30 mL  30 mL Oral Daily PRN Dahal, Binaya, MD      . metroNIDAZOLE (FLAGYL) IVPB 500 mg  500 mg Intravenous Q8H Dahal, Binaya, MD 100 mL/hr at 08/09/19 0837 500 mg at 08/09/19 0837  . ondansetron (ZOFRAN) tablet 4 mg  4 mg Oral Q6H PRN Dahal, Marlowe Aschoff, MD       Or  . ondansetron (ZOFRAN) injection 4 mg  4 mg Intravenous Q6H PRN Dahal, Binaya, MD      . sodium chloride flush (NS) 0.9 % injection 3 mL  3 mL Intravenous Q12H Dahal, Marlowe Aschoff, MD   3 mL at 08/08/19 2256  . sodium phosphate (FLEET) 7-19 GM/118ML enema 1 enema  1 enema Rectal Once PRN Dahal, Binaya, MD      . sorbitol 70 % solution 30 mL  30 mL Oral Daily PRN Terrilee Croak, MD        Allergies as of 08/08/2019 - Review Complete 08/08/2019  Allergen Reaction Noted  . Amoxicillin-pot clavulanate Other (See Comments)   . Atorvastatin    . Lovastatin      Family History  Problem Relation Age of Onset  . Liver disease Father   . Kidney disease Father   . Heart disease Father        before age 3  . Heart disease Mother        before age 18    Social History   Socioeconomic History  . Marital status: Married    Spouse name: Not on file  . Number of children: Not on file  . Years of education: Not on file  . Highest education level: Not on file  Occupational History  . Not on file  Social Needs  . Financial resource strain: Not on file  . Food insecurity    Worry: Not on file    Inability: Not on file  . Transportation needs    Medical: Not on file    Non-medical: Not on file  Tobacco Use  . Smoking status: Former Smoker    Quit date: 1956    Years since quitting:  64.8  . Smokeless tobacco: Former Systems developer    Quit date: 11/03/1954  Substance and Sexual Activity  . Alcohol use: No    Alcohol/week: 0.0 standard drinks  . Drug use: No  . Sexual activity: Not on file  Lifestyle  . Physical activity    Days per week: Not on file    Minutes per session: Not on file  . Stress: Not on file  Relationships  . Social Herbalist on phone: Not  on file    Gets together: Not on file    Attends religious service: Not on file    Active member of club or organization: Not on file    Attends meetings of clubs or organizations: Not on file    Relationship status: Not on file  . Intimate partner violence    Fear of current or ex partner: Not on file    Emotionally abused: Not on file    Physically abused: Not on file    Forced sexual activity: Not on file  Other Topics Concern  . Not on file  Social History Narrative   From [place]   Moved to [place] to [place] 2006   5 daughters    Review of Systems: ROS is negative except as mentioned in HPI.  Physical Exam: Vital signs in last 24 hours: Temp:  [97.8 F (36.6 C)-98.3 F (36.8 C)] 97.8 F (36.6 C) (10/06 0523) Pulse Rate:  [65-88] 88 (10/06 0523) Resp:  [11-22] 18 (10/06 0523) BP: (106-133)/(52-73) 133/73 (10/06 0523) SpO2:  [98 %-100 %] 99 % (10/06 0523) Weight:  [53.5 kg-57 kg] 53.5 kg (10/05 1831) Last BM Date: 08/09/19 General:  Alert, Well-developed, well-nourished, pleasant and cooperative in NAD Head:  Normocephalic and atraumatic. Eyes:  Sclera clear, no icterus.  Conjunctiva pink. Ears:  Normal auditory acuity. Mouth:  No deformity or lesions.   Lungs:  Clear throughout to auscultation.   No wheezes, crackles, or rhonchi.  Heart:  Regular rate and rhythm; no M/R/G.Marland Kitchen Abdomen:  Soft, non-distended.  BS present.  Non-tender. Msk:  Symmetrical without gross deformities. Pulses:  Normal pulses noted. Extremities:  Without clubbing or edema. Neurologic:  Alert and  oriented x 4;  grossly normal neurologically. Skin:  Intact without significant lesions or rashes. Psych:  Alert and cooperative. Normal mood and affect.  Intake/Output from previous day: 10/05 0701 - 10/06 0700 In: 1436 [I.V.:486; IV Piggyback:950] Out: 200 [Urine:200]  Lab Results: Recent Labs    08/08/19 1132 08/08/19 1617 08/09/19 0537  WBC 6.7 5.4 5.9  HGB 9.1* 9.4* 9.4*  HCT 27.7* 28.2* 29.2*  PLT 231 265 250   BMET Recent Labs    08/08/19 1132 08/09/19 0537  NA 135 141  K 3.6 3.5  CL 111 115*  CO2 16* 13*  GLUCOSE 95 69*  BUN 57* 49*  CREATININE 4.43* 3.72*  CALCIUM 7.8* 7.3*   LFT Recent Labs    08/08/19 1132  PROT 5.7*  ALBUMIN 2.9*  AST 14*  ALT 14  ALKPHOS 45  BILITOT 0.4   Studies/Results: Ct Abdomen Pelvis Wo Contrast  Result Date: 08/08/2019 CLINICAL DATA:  Pt states that he continues to have blood in stool. Pt states that he has taken medication as prescribed without relief. Pt describes it as "a lot". Pt talks about pain in stomach with walking. Pt has weakness. Pt.c/o mid abdomi.*comment was truncated*Abd distension EXAM: CT ABDOMEN AND PELVIS WITHOUT CONTRAST TECHNIQUE: Multidetector CT imaging of the abdomen and pelvis was performed following the standard protocol without IV contrast. COMPARISON:  CT abdomen 07/28/2019 FINDINGS: Lower chest: Mild pleural thickening lung bases stable. No pleural effusion Hepatobiliary: Pneumobilia within the LEFT hepatic lobe again noted. Postcholecystectomy. Gas within the common bile duct. Pancreas: Pancreas is normal. No ductal dilatation. No pancreatic inflammation. Spleen: Normal spleen Adrenals/urinary tract: Adrenal glands normal. Mixed density renal cysts can of the fully characterize on noncontrast exam. No interval change. No obstructive uropathy. Bladder normal. Stomach/Bowel: Stomach  duodenum are normal. The small bowel is normal. The terminal ileum is normal. Appendix not identified. There is submucosal  edema bowel wall thickening of the ascending, transverse and sigmoid colon. The rectum appear spared. These findings are very similar to comparison CT 07/28/2019. No improvement. No oral contrast administered makes comparison difficult. No pneumatosis or portal venous gas. Venous congestion in the colonic mesentery again noted. Vascular/Lymphatic: Abdominal aorta is normal caliber with atherosclerotic calcification. There is no retroperitoneal or periportal lymphadenopathy. No pelvic lymphadenopathy. Reproductive: Prostate normal Other: No free fluid. Musculoskeletal: No aggressive osseous lesion. IMPRESSION: 1. Long segment of bowel thickening involving the ascending/ transverse and sigmoid colon. Findings are very similar to comparison CT 07/28/2019 and consistent with pancolitis. Recommend correlation for C difficile colitis or other infectious etiologies. Would also consider inflammatory bowel disease. No IV or oral contrast administered. 2. No bowel perforation.  Pneumatosis intestinalis. 3. Pneumobilia related to prior sphincterotomy again noted. Electronically Signed   By: Suzy Bouchard M.D.   On: 08/08/2019 13:53   IMPRESSION:  *85 year old male with 14 day history of diarrhea and rectal bleeding (daughter indicates it may be going on more like 4 weeks now actually).  Last colonoscopy 10 years ago.  Cdiff quick scan negative, other stool studies pending.  ? Infectious vs inflammatory vs ischemic.  No improvement in symptoms or CT scan findings despite antibiotics.  PLAN: -Tentatively planning for colonoscopy on 10/7 with Dr. Lyndel Safe.  Clear liquids today and then NPO after midnight. -Await results of remaining stool studies.   Laban Emperor. Zehr  08/09/2019, 10:44 AM    Attending physician's note   I have taken an interval history, reviewed the chart and examined the patient. I agree with the Advanced Practitioner's note, impression and recommendations.   Pancolitis - ?etiology.  Infectious vs  inflammatory vs ischemic. Neg stool for C. Diff. On azithromycin/Rocephin/Metro. CT (non contrast) x 2 reviewed.  COVID-19 neg 10/5 Weight loss (20lb over 4 weeks).  Comorbid conditions include -HTN, CKD 4.  Plan: -Await remaining stool studies. -Proceed with colonoscopy in a.m. Discussed risks and benefits with the patient and patient's wife. -Continue antibiotics for now.   Carmell Austria, MD [facility] [phone].

## 2019-08-09 NOTE — Consult Note (Signed)
[facility]  HISTORY AND PHYSICAL  Pedro Young is an 85 y.o. male.    Chief Complaint: abd pain and diarrhea  HPI: Pt is an 67 M with a PMH significant for CKD IV and HTN who is now seen in consultation at the request of Dr. Pietro Cassis for eval and recs re: AKI on CKD IV.  Pt came to ED 9/24 with abd pain and diarrhea, was discharged on cipro/ flagyl for diffuse colitis.  Unremitting since then.  Accompanied by abd pain.  No n/v.  Having 3-4 liquid stools daily.  Not able to eat much.  Not really better with the antibiotics.  Had BRBPR the past day and then came to ED.    FOBT +. C diff negative.  CT scan 10/5 with pancolitis, pneumatosis intestinalis.    Hgb 9.1, had an AKI peaking at 4.43, is 3.72 today.  Baseline looks to be around 3.5.  Is on ceftriaxone, azithro, and flagyl.  GI consulted, GI pathogen panel pending, stool culture pending, for c-scope tomorrow with golytely prep.  In this setting we are asked to see.  Pt follows with Dr. Lorrene Reid.  Has an AVF placed 05/2019.  Appears ready for use.  Pt denies metallic taste, dysgeusia, jerking movements.  On IVFs.  PMH: Past Medical History:  Diagnosis Date  . Cancer ([place])   . Hypertension   . Renal disorder    CRF   PSH: Past Surgical History:  Procedure Laterality Date  . AV FISTULA PLACEMENT Left 05/04/2019   Procedure: Arteriovenous (Av) Fistula Creation Left Arm;  Surgeon: Rosetta Posner, MD;  Location: [facility];  Service: Vascular;  Laterality: Left;  . CHOLECYSTECTOMY    . EYE SURGERY Bilateral    cataract    Past Medical History:  Diagnosis Date  . Cancer ([place])   . Hypertension   . Renal disorder    CRF    Medications:   Prior to Admission:  Medications Prior to Admission  Medication Sig Dispense Refill Last Dose  . amLODipine (NORVASC) 10 MG tablet Take 10 mg by mouth at bedtime.    08/07/2019 at Unknown time  . labetalol (NORMODYNE) 100 MG tablet Take 100 mg by mouth 2 (two) times daily.   08/08/2019 at  Unknown time  . Omega-3 Fatty Acids (FISH OIL PO) Take 1,500 mg by mouth daily.    Past Week at Unknown time  . ciprofloxacin (CIPRO) 500 MG tablet Take 1 tablet (500 mg total) by mouth 2 (two) times daily. 14 tablet 0 08/06/2019  . metroNIDAZOLE (FLAGYL) 500 MG tablet Take 1 tablet (500 mg total) by mouth 2 (two) times daily. 14 tablet 0 08/06/2019    Medications Prior to Admission  Medication Sig Dispense Refill  . amLODipine (NORVASC) 10 MG tablet Take 10 mg by mouth at bedtime.     Marland Kitchen labetalol (NORMODYNE) 100 MG tablet Take 100 mg by mouth 2 (two) times daily.    . Omega-3 Fatty Acids (FISH OIL PO) Take 1,500 mg by mouth daily.     . ciprofloxacin (CIPRO) 500 MG tablet Take 1 tablet (500 mg total) by mouth 2 (two) times daily. 14 tablet 0  . metroNIDAZOLE (FLAGYL) 500 MG tablet Take 1 tablet (500 mg total) by mouth 2 (two) times daily. 14 tablet 0    ALLERGIES:   Allergies  Allergen Reactions  . Amoxicillin-Pot Clavulanate Other (See Comments)    Did it involve swelling of the face/tongue/throat, SOB, or low BP? Unknown Did  it involve sudden or severe rash/hives, skin peeling, or any reaction on the inside of your mouth or nose? Unknown Did you need to seek medical attention at a hospital or doctor's office? Unknown When did it last happen?patient is unfamiliar with this allergy If all above answers are "NO", may proceed with cephalosporin use.   . Atorvastatin     REACTION: arthralgia  . Lovastatin     REACTION: arthralgia    FAM HX: Family History  Problem Relation Age of Onset  . Liver disease Father   . Kidney disease Father   . Heart disease Father        before age 25  . Heart disease Mother        before age 49    Social History:   reports that he quit smoking about 64 years ago. He quit smokeless tobacco use about 64 years ago. He reports that he does not drink alcohol or use drugs.  ROS: ROS: all other systems reviewed and are negative except as per  HPI  Blood pressure 123/64, pulse 88, temperature 97.7 F (36.5 C), temperature source Oral, resp. rate 16, height 5\' 7"  (1.702 m), weight 53.5 kg, SpO2 98 %. PHYSICAL EXAM: Physical Exam  GEN: older gentleman, lying flat, NAD HEENT EOMI PERRL NECK no JVD PULM clear bilaterally CV RRR no m/r/g ABD soft, hyperactive bowel sounds, mildly diffusely tender (improved per pt) EXT no LE edema NEURO AAO x 3 nonfocal SKIN no rashes or lesions MSK +arthritic changes   Results for orders placed or performed during the hospital encounter of 08/08/19 (from the past 48 hour(s))  Type and screen [place]     Status: None   Collection Time: 08/08/19 11:20 AM  Result Value Ref Range   ABO/RH(D) A POS    Antibody Screen NEG    Sample Expiration      08/11/2019,2359 Performed at [facility], [place] [address] [place], [place] [address]   Comprehensive metabolic panel     Status: Abnormal   Collection Time: 08/08/19 11:32 AM  Result Value Ref Range   Sodium 135 135 - 145 mmol/L   Potassium 3.6 3.5 - 5.1 mmol/L   Chloride 111 98 - 111 mmol/L   CO2 16 (L) 22 - 32 mmol/L   Glucose, Bld 95 70 - 99 mg/dL   BUN 57 (H) 8 - 23 mg/dL   Creatinine, Ser 4.43 (H) 0.61 - 1.24 mg/dL   Calcium 7.8 (L) 8.9 - 10.3 mg/dL   Total Protein 5.7 (L) 6.5 - 8.1 g/dL   Albumin 2.9 (L) 3.5 - 5.0 g/dL   AST 14 (L) 15 - 41 U/L   ALT 14 0 - 44 U/L   Alkaline Phosphatase 45 38 - 126 U/L   Total Bilirubin 0.4 0.3 - 1.2 mg/dL   GFR calc non Af Amer 11 (L) >60 mL/min   GFR calc Af Amer 13 (L) >60 mL/min   Anion gap 8 5 - 15    Comment: Performed at [facility], [place] [address], [place], [place] [address]  CBC     Status: Abnormal   Collection Time: 08/08/19 11:32 AM  Result Value Ref Range   WBC 6.7 4.0 - 10.5 K/uL   RBC 2.97 (L) 4.22 - 5.81 MIL/uL   Hemoglobin 9.1 (L) 13.0 - 17.0 g/dL   HCT 27.7 (L) 39.0 - 52.0 %   MCV 93.3 80.0 - 100.0 fL   MCH 30.6  26.0  - 34.0 pg   MCHC 32.9 30.0 - 36.0 g/dL   RDW 14.3 11.5 - 15.5 %   Platelets 231 150 - 400 K/uL   nRBC 0.0 0.0 - 0.2 %    Comment: Performed at [facility], [place] [address], [place], [place] [address]  POC occult blood, ED     Status: Abnormal   Collection Time: 08/08/19  1:09 PM  Result Value Ref Range   Fecal Occult Bld POSITIVE (A) NEGATIVE  C Difficile Quick Screen w PCR reflex     Status: None   Collection Time: 08/08/19  1:12 PM   Specimen: Stool  Result Value Ref Range   C Diff antigen NEGATIVE NEGATIVE   C Diff toxin NEGATIVE NEGATIVE   C Diff interpretation No C. difficile detected.     Comment: Performed at [facility], [place] [address], [place], [place] [address]  SARS Coronavirus 2 [facility] order, Performed in [facility] hospital lab) Nasopharyngeal Nasopharyngeal Swab     Status: None   Collection Time: 08/08/19  1:13 PM   Specimen: Nasopharyngeal Swab  Result Value Ref Range   SARS Coronavirus 2 NEGATIVE NEGATIVE    Comment: (NOTE) If result is NEGATIVE SARS-CoV-2 target nucleic acids are NOT DETECTED. The SARS-CoV-2 RNA is generally detectable in upper and lower  respiratory specimens during the acute phase of infection. The lowest  concentration of SARS-CoV-2 viral copies this assay can detect is 250  copies / mL. A negative result does not preclude SARS-CoV-2 infection  and should not be used as the sole basis for treatment or other  patient management decisions.  A negative result may occur with  improper specimen collection / handling, submission of specimen other  than nasopharyngeal swab, presence of viral mutation(s) within the  areas targeted by this assay, and inadequate number of viral copies  (<250 copies / mL). A negative result must be combined with clinical  observations, patient history, and epidemiological information. If result is POSITIVE SARS-CoV-2 target nucleic acids are DETECTED. The SARS-CoV-2 RNA is  generally detectable in upper and lower  respiratory specimens dur ing the acute phase of infection.  Positive  results are indicative of active infection with SARS-CoV-2.  Clinical  correlation with patient history and other diagnostic information is  necessary to determine patient infection status.  Positive results do  not rule out bacterial infection or co-infection with other viruses. If result is PRESUMPTIVE POSTIVE SARS-CoV-2 nucleic acids MAY BE PRESENT.   A presumptive positive result was obtained on the submitted specimen  and confirmed on repeat testing.  While 2019 novel coronavirus  (SARS-CoV-2) nucleic acids may be present in the submitted sample  additional confirmatory testing may be necessary for epidemiological  and / or clinical management purposes  to differentiate between  SARS-CoV-2 and other Sarbecovirus currently known to infect humans.  If clinically indicated additional testing with an alternate test  methodology [phone]) is advised. The SARS-CoV-2 RNA is generally  detectable in upper and lower respiratory sp ecimens during the acute  phase of infection. The expected result is Negative. Fact Sheet for Patients:  StrictlyIdeas.no Fact Sheet for Healthcare Providers: BankingDealers.co.za This test is not yet approved or cleared by the [place] FDA and has been authorized for detection and/or diagnosis of SARS-CoV-2 by FDA under an Emergency Use Authorization (EUA).  This EUA will remain in effect (meaning this test can be used) for the duration of the COVID-19 declaration under Section 564(b)(1)  of the Act, 21 U.S.C. section 360bbb-3(b)(1), unless the authorization is terminated or revoked sooner. Performed at [facility], [place] [address], [place], [place] [address]   CBC     Status: Abnormal   Collection Time: 08/08/19  4:17 PM  Result Value Ref Range   WBC 5.4 4.0 - 10.5 K/uL   RBC  3.06 (L) 4.22 - 5.81 MIL/uL   Hemoglobin 9.4 (L) 13.0 - 17.0 g/dL   HCT 28.2 (L) 39.0 - 52.0 %   MCV 92.2 80.0 - 100.0 fL   MCH 30.7 26.0 - 34.0 pg   MCHC 33.3 30.0 - 36.0 g/dL   RDW 14.3 11.5 - 15.5 %   Platelets 265 150 - 400 K/uL    Comment: Immature Platelet Fraction may be clinically indicated, consider ordering this additional test [id number]    nRBC 0.0 0.0 - 0.2 %    Comment: Performed at [facility], [place] [address] [place], [place] [address]  Basic metabolic panel     Status: Abnormal   Collection Time: 08/09/19  5:37 AM  Result Value Ref Range   Sodium 141 135 - 145 mmol/L   Potassium 3.5 3.5 - 5.1 mmol/L   Chloride 115 (H) 98 - 111 mmol/L   CO2 13 (L) 22 - 32 mmol/L   Glucose, Bld 69 (L) 70 - 99 mg/dL   BUN 49 (H) 8 - 23 mg/dL   Creatinine, Ser 3.72 (H) 0.61 - 1.24 mg/dL   Calcium 7.3 (L) 8.9 - 10.3 mg/dL   GFR calc non Af Amer 14 (L) >60 mL/min   GFR calc Af Amer 16 (L) >60 mL/min   Anion gap 13 5 - 15    Comment: Performed at [facility], [place] [address], [place], [place] [address]  CBC     Status: Abnormal   Collection Time: 08/09/19  5:37 AM  Result Value Ref Range   WBC 5.9 4.0 - 10.5 K/uL   RBC 3.05 (L) 4.22 - 5.81 MIL/uL   Hemoglobin 9.4 (L) 13.0 - 17.0 g/dL   HCT 29.2 (L) 39.0 - 52.0 %   MCV 95.7 80.0 - 100.0 fL   MCH 30.8 26.0 - 34.0 pg   MCHC 32.2 30.0 - 36.0 g/dL   RDW 14.6 11.5 - 15.5 %   Platelets 250 150 - 400 K/uL   nRBC 0.0 0.0 - 0.2 %    Comment: Performed at [facility], [place] [address] [place],  [address]    Ct Abdomen Pelvis Wo Contrast  Result Date: 08/08/2019 CLINICAL DATA:  Pt states that he continues to have blood in stool. Pt states that he has taken medication as prescribed without relief. Pt describes it as "a lot". Pt talks about pain in stomach with walking. Pt has weakness. Pt.c/o mid abdomi.*comment was truncated*Abd distension EXAM: CT ABDOMEN AND PELVIS WITHOUT  CONTRAST TECHNIQUE: Multidetector CT imaging of the abdomen and pelvis was performed following the standard protocol without IV contrast. COMPARISON:  CT abdomen 07/28/2019 FINDINGS: Lower chest: Mild pleural thickening lung bases stable. No pleural effusion Hepatobiliary: Pneumobilia within the LEFT hepatic lobe again noted. Postcholecystectomy. Gas within the common bile duct. Pancreas: Pancreas is normal. No ductal dilatation. No pancreatic inflammation. Spleen: Normal spleen Adrenals/urinary tract: Adrenal glands normal. Mixed density renal cysts can of the fully characterize on noncontrast exam. No interval change. No obstructive uropathy. Bladder normal. Stomach/Bowel: Stomach duodenum are normal. The small bowel is normal. The terminal ileum  is normal. Appendix not identified. There is submucosal edema bowel wall thickening of the ascending, transverse and sigmoid colon. The rectum appear spared. These findings are very similar to comparison CT 07/28/2019. No improvement. No oral contrast administered makes comparison difficult. No pneumatosis or portal venous gas. Venous congestion in the colonic mesentery again noted. Vascular/Lymphatic: Abdominal aorta is normal caliber with atherosclerotic calcification. There is no retroperitoneal or periportal lymphadenopathy. No pelvic lymphadenopathy. Reproductive: Prostate normal Other: No free fluid. Musculoskeletal: No aggressive osseous lesion. IMPRESSION: 1. Long segment of bowel thickening involving the ascending/ transverse and sigmoid colon. Findings are very similar to comparison CT 07/28/2019 and consistent with pancolitis. Recommend correlation for C difficile colitis or other infectious etiologies. Would also consider inflammatory bowel disease. No IV or oral contrast administered. 2. No bowel perforation.  Pneumatosis intestinalis. 3. Pneumobilia related to prior sphincterotomy again noted. Electronically Signed   By: Suzy Bouchard M.D.   On:  08/08/2019 13:53    Assessment/Plan  1.  AKI on CKD IV: related to volume depletion with diarrhea.  Fortunately rapidly trending to baseline with IVFs. (baseline appears to be around 3.5)  No uremic symptoms, no indication for dialysis.  Avoid NSAIDs, IV contrast, fleets enemas, mag citrate, milk of magnesia, PPI if able.  Will follow with you.    2.  Pancolitis: on azithro, ceftriaxone, flagyl.  C diff neg, stool culture and GI path panel pending.  GI c/s, appreciate assistance and is on for scope tomorrow.  No renal contraindication to Golytely prep.  Hgb appears to be slightly down from 9/24 10.8--> 9.4, has been stable on serial rechecks here.  3.  HTN: on labetalol and amlodipine at home.    4.  Metabolic acidosis: changing IVFs to bicarb gtt--> K 3.5, anticipate needing K repletion so will order gently 20 Meq packet daily  Suman Trivedi 08/09/2019, 3:21 PM

## 2019-08-09 NOTE — Progress Notes (Signed)
PROGRESS NOTE  Karlis Cregg  DOB: [birth year]  PCP: Gregor Hams, FNP LKT:[id number]  DOA: 08/08/2019  LOS: 1 day   Brief narrative: Pedro Young is a 85 y.o. male with past medical history of hypertension, CKD stage 4-5 who presented to the ED with complaint of non-remitting diarrhea for last 10 days. Patient first came to the ED on 9/24 with 2 days history of diarrhea. CT scan of abdomen and pelvis showed fairly significant diffuse colitis.  Patient was hemodynamically stable and was discharged on ciprofloxacin and Flagyl.  Patient and family state that he took the antibiotics as advised however continued to have diarrhea.  For last 3 days he is also seen blood mixed in the stool. Associated with constant abdominal pain, no fever. Not on a blood thinner. In the ED, patient was afebrile, blood pressure in normal range, heart rate in 70s, oxygen saturation 100% on room air. Labs showed WBC count 6.7, hemoglobin 9.1 compared to 10.8 on 9/24, platelet count 231. Sodium 135, potassium 3.6, serum bicarb 16 compared to 20 on 9/24.  BUN/creatinine 57/4.43 compared to 70/3.82 on 9/24, albumin 2.9. COVID-19 test negative. Urinalysis showed moderate amount of blood, 100 protein, no bacteria and 0-5 WBCs. Fecal occult blood test positive. C. difficile antigen and toxin negative. CT abdomen and pelvis today showed 1. Long segment of bowel thickening involving the ascending/transverse and sigmoid colon. Findings are very similar to comparison CT 07/28/2019 and consistent with pancolitis.  2. No bowel perforation. Pneumatosis intestinalis. 3. Pneumobilia related to prior sphincterotomy again noted.  Hospital consultation was called for inpatient admission and management. Prior to onset of diarrhea, patient was physically active and overall healthy for his age.  He has been less active since diarrhea started.  Subjective: Patient was seen and examined this morning.  Pleasant elderly male,  states that his diarrhea is 75% better overnight.  However continues to have blood in his stool.  Continues to have abdominal pain.  No fever, WBC count remains normal.  Assessment/Plan: Acute gastroenteritis Bright red blood per rectum -Presented with non-remitting symptoms for 12 days despite 10 days of oral Cipro and Flagyl. -Also associated with BRBPR for 3 days. -Infectious versus inflammatory versus ischemic etiology. -Failed treatment as an outpatient with Cipro and Flagyl.  - On admission, I started him on IV Rocephin and IV Flagyl along with azithromycin. States 75% improvement in diarrhea overnight with addition of azithromycin, I wonder if this is Campylobacter diarrhea. C. difficile negative but other stool studies pending.  -Also has high likelihood of noninfectious cause because of absence of fever, absence of leukocytosis and persistence of symptoms despite Cipro and Flagyl. -I will start on clear liquid diet today.  GI consultation called because of BRBPR.  Hemoglobin stable at 9.4.  AKI on CKD stage IV-V Metabolic acidosis -Creatinine was 3.82 on 9/24, elevated to 4.43 at presentation, improving, 3.72 today..  Patient already has an AV fistula in place but has not been started on dialysis. -Serum bicarb level fell down from 16 yesterday to 13 today.  Continue to monitor.  If continues to follow, we may have to start on bicarbonate, oral or drip.  Patient follows up with nephrologist Dr. Jamal Maes with [facility].  Consultation called. -No need of dialysis at this time.  We will keep the patient hydrated with normal saline for at least another 24 hours  Hypertension -Home meds include amlodipine and labetalol. -Keep him on hold for now.  Resume as blood pressure picks up.  Mobility: Encourage ambulation Diet: Clear liquid diet for now, n.p.o. after midnight DVT prophylaxis: Will avoid heparin or Lovenox because of bleeding.  SCD boots.  Code Status: Full code Family  Communication: Wife at bedside Disposition Plan:  Continue inpatient management  Consultants:  GI  Nephrology  Procedures:  None  Antimicrobials: Anti-infectives (From admission, onward)   Start     Dose/Rate Route Frequency Ordered Stop   08/08/19 1700  azithromycin (ZITHROMAX) 500 mg in sodium chloride 0.9 % 250 mL IVPB     500 mg 250 mL/hr over 60 Minutes Intravenous Every 24 hours 08/08/19 1559     08/08/19 1645  cefTRIAXone (ROCEPHIN) 2 g in sodium chloride 0.9 % 100 mL IVPB     2 g 200 mL/hr over 30 Minutes Intravenous Every 24 hours 08/08/19 1559     08/08/19 1600  metroNIDAZOLE (FLAGYL) IVPB 500 mg     500 mg 100 mL/hr over 60 Minutes Intravenous Every 8 hours 08/08/19 1559        Diet Order            Diet NPO time specified  Diet effective midnight              Infusions:  . sodium chloride    . sodium chloride 125 mL/hr at 08/08/19 1839  . azithromycin 500 mg (08/08/19 2255)  . cefTRIAXone (ROCEPHIN)  IV 2 g (08/08/19 2101)  . metronidazole 500 mg (08/09/19 0837)    Scheduled Meds: . sodium chloride flush  3 mL Intravenous Q12H    PRN meds: acetaminophen **OR** acetaminophen, magnesium hydroxide, ondansetron **OR** ondansetron (ZOFRAN) IV, sodium phosphate, sorbitol   Objective: Vitals:   08/08/19 1950 08/09/19 0523  BP: 121/66 133/73  Pulse: 80 88  Resp: 20 18  Temp: 98.1 F (36.7 C) 97.8 F (36.6 C)  SpO2: 98% 99%    Intake/Output Summary (Last 24 hours) at 08/09/2019 1006 Last data filed at 08/09/2019 0300 Gross per 24 hour  Intake 1435.97 ml  Output 200 ml  Net 1235.97 ml   Filed Weights   08/08/19 1045 08/08/19 1831  Weight: 57 kg 53.5 kg   Weight change:  Body mass index is 18.48 kg/m.   Physical Exam: General exam: Appears calm and comfortable.  Skin: No rashes, lesions or ulcers. HEENT: Atraumatic, normocephalic, supple neck, no obvious bleeding Lungs: Clear to auscultation bilaterally CVS: Regular rate and rhythm,  no murmur GI/Abd soft, nondistended, significant tenderness in lower abdomen, bowel sound present CNS: Alert, awake, oriented x3 Psychiatry: Mood appropriate, judgment and insight clear Extremities: No pedal edema, no calf tenderness  Data Review: I have personally reviewed the laboratory data and studies available.  Recent Labs  Lab 08/08/19 1132 08/08/19 1617 08/09/19 0537  WBC 6.7 5.4 5.9  HGB 9.1* 9.4* 9.4*  HCT 27.7* 28.2* 29.2*  MCV 93.3 92.2 95.7  PLT 231 265 250   Recent Labs  Lab 08/08/19 1132 08/09/19 0537  NA 135 141  K 3.6 3.5  CL 111 115*  CO2 16* 13*  GLUCOSE 95 69*  BUN 57* 49*  CREATININE 4.43* 3.72*  CALCIUM 7.8* 7.3*    Terrilee Croak, MD  [facility] 08/09/2019

## 2019-08-09 NOTE — H&P (View-Only) (Signed)
Referring Provider:  Dr. Pietro Cassis, [facility] Primary Care Physician:  Gregor Hams, FNP Primary Gastroenterologist:  Dr. Henrene Pastor  Reason for Consultation:  GI bleeding  HPI: Pedro Young is a 85 y.o. male with PMH of HTN, CKD stage 3-4, and remote history of pancreatitis related to gallstones show presented to the hospital with complaints of diarrhea and rectal bleeding.  The patient tells me this is been present for about 2 weeks, but his doctor entered the room halfway into our visit and indicated that maybe closer to 4 weeks that this is been occurring.  Symptoms were rather sudden in onset and he claims he was moving his bowels normally up until this began.  They report that he has had a lot of bleeding since this diarrhea has occurred.  CT scan abdomen and pelvis without contrast on 9/24 showed the following:  1. Infectious or inflammatory colitis, which involves the ascending colon and right transverse colon as well as the distal descending colon and sigmoid colon to its midportion. There is associated mesenteric congestion and numerous subcentimeter mesenteric lymph nodes. No extraluminal air. No abscess. 2. No other acute abnormality within the abdomen or pelvis. 3. Aortic atherosclerosis.  He was then discharged from the ED on cipro and flagyl, which he took as directed but symptoms did not improve/resolve.  He returned to the ED yesterday, 10/5.    CT scan of the abdomen and pelvis without contrast was repeated on 10/5 and showed the following:  IMPRESSION: 1. Long segment of bowel thickening involving the ascending/ transverse and sigmoid colon. Findings are very similar to comparison CT 07/28/2019 and consistent with pancolitis. Recommend correlation for C difficile colitis or other infectious etiologies. Would also consider inflammatory bowel disease. No IV or oral contrast administered. 2. No bowel perforation.  Pneumatosis intestinalis. 3. Pneumobilia related to prior  sphincterotomy again noted.  Cdiff quick scan was negative, but GI pathogen panel and stool culture are pending.  He is currently receiving azithromycin, rocephin, and flagyl.  He says that he feels about 75% improved since coming in yesterday.  Last colonoscopy by Dr. Sharlett Iles showed severe diverticulosis and one diminutive polyp in 06/2009.  Past Medical History:  Diagnosis Date  . Cancer ([place])   . Hypertension   . Renal disorder    CRF    Past Surgical History:  Procedure Laterality Date  . AV FISTULA PLACEMENT Left 05/04/2019   Procedure: Arteriovenous (Av) Fistula Creation Left Arm;  Surgeon: Rosetta Posner, MD;  Location: [facility];  Service: Vascular;  Laterality: Left;  . CHOLECYSTECTOMY    . EYE SURGERY Bilateral    cataract    Prior to Admission medications   Medication Sig Start Date End Date Taking? Authorizing Provider  amLODipine (NORVASC) 10 MG tablet Take 10 mg by mouth at bedtime.  05/15/14  Yes [provider]  labetalol (NORMODYNE) 100 MG tablet Take 100 mg by mouth 2 (two) times daily.   Yes [provider]  Omega-3 Fatty Acids (FISH OIL PO) Take 1,500 mg by mouth daily.    Yes [provider]  ciprofloxacin (CIPRO) 500 MG tablet Take 1 tablet (500 mg total) by mouth 2 (two) times daily. 07/28/19   [name], MD  metroNIDAZOLE (FLAGYL) 500 MG tablet Take 1 tablet (500 mg total) by mouth 2 (two) times daily. 07/28/19   [name], MD    Current Facility-Administered Medications  Medication Dose Route Frequency Provider Last Rate Last Dose  . 0.9 %  sodium chloride infusion  1,000 mL Intravenous Continuous Pfeiffer, Jeannie Done, MD      . 0.9 %  sodium chloride infusion   Intravenous Continuous Dahal, Marlowe Aschoff, MD 125 mL/hr at 08/08/19 1839    . acetaminophen (TYLENOL) tablet 650 mg  650 mg Oral Q6H PRN Dahal, Marlowe Aschoff, MD       Or  . acetaminophen (TYLENOL) suppository 650 mg  650 mg Rectal Q6H PRN Dahal, Marlowe Aschoff, MD      . azithromycin  (ZITHROMAX) 500 mg in sodium chloride 0.9 % 250 mL IVPB  500 mg Intravenous Q24H Dahal, Binaya, MD 250 mL/hr at 08/08/19 2255 500 mg at 08/08/19 2255  . cefTRIAXone (ROCEPHIN) 2 g in sodium chloride 0.9 % 100 mL IVPB  2 g Intravenous Q24H Dahal, Binaya, MD 200 mL/hr at 08/08/19 2101 2 g at 08/08/19 2101  . magnesium hydroxide (MILK OF MAGNESIA) suspension 30 mL  30 mL Oral Daily PRN Dahal, Binaya, MD      . metroNIDAZOLE (FLAGYL) IVPB 500 mg  500 mg Intravenous Q8H Dahal, Binaya, MD 100 mL/hr at 08/09/19 0837 500 mg at 08/09/19 0837  . ondansetron (ZOFRAN) tablet 4 mg  4 mg Oral Q6H PRN Dahal, Marlowe Aschoff, MD       Or  . ondansetron (ZOFRAN) injection 4 mg  4 mg Intravenous Q6H PRN Dahal, Binaya, MD      . sodium chloride flush (NS) 0.9 % injection 3 mL  3 mL Intravenous Q12H Dahal, Marlowe Aschoff, MD   3 mL at 08/08/19 2256  . sodium phosphate (FLEET) 7-19 GM/118ML enema 1 enema  1 enema Rectal Once PRN Dahal, Binaya, MD      . sorbitol 70 % solution 30 mL  30 mL Oral Daily PRN Terrilee Croak, MD        Allergies as of 08/08/2019 - Review Complete 08/08/2019  Allergen Reaction Noted  . Amoxicillin-pot clavulanate Other (See Comments)   . Atorvastatin    . Lovastatin      Family History  Problem Relation Age of Onset  . Liver disease Father   . Kidney disease Father   . Heart disease Father        before age 76  . Heart disease Mother        before age 70    Social History   Socioeconomic History  . Marital status: Married    Spouse name: Not on file  . Number of children: Not on file  . Years of education: Not on file  . Highest education level: Not on file  Occupational History  . Not on file  Social Needs  . Financial resource strain: Not on file  . Food insecurity    Worry: Not on file    Inability: Not on file  . Transportation needs    Medical: Not on file    Non-medical: Not on file  Tobacco Use  . Smoking status: Former Smoker    Quit date: 1956    Years since quitting:  64.8  . Smokeless tobacco: Former Systems developer    Quit date: 11/03/1954  Substance and Sexual Activity  . Alcohol use: No    Alcohol/week: 0.0 standard drinks  . Drug use: No  . Sexual activity: Not on file  Lifestyle  . Physical activity    Days per week: Not on file    Minutes per session: Not on file  . Stress: Not on file  Relationships  . Social Herbalist on phone: Not  on file    Gets together: Not on file    Attends religious service: Not on file    Active member of club or organization: Not on file    Attends meetings of clubs or organizations: Not on file    Relationship status: Not on file  . Intimate partner violence    Fear of current or ex partner: Not on file    Emotionally abused: Not on file    Physically abused: Not on file    Forced sexual activity: Not on file  Other Topics Concern  . Not on file  Social History Narrative   From [place]   Moved to [place] to [place] 2006   5 daughters    Review of Systems: ROS is negative except as mentioned in HPI.  Physical Exam: Vital signs in last 24 hours: Temp:  [97.8 F (36.6 C)-98.3 F (36.8 C)] 97.8 F (36.6 C) (10/06 0523) Pulse Rate:  [65-88] 88 (10/06 0523) Resp:  [11-22] 18 (10/06 0523) BP: (106-133)/(52-73) 133/73 (10/06 0523) SpO2:  [98 %-100 %] 99 % (10/06 0523) Weight:  [53.5 kg-57 kg] 53.5 kg (10/05 1831) Last BM Date: 08/09/19 General:  Alert, Well-developed, well-nourished, pleasant and cooperative in NAD Head:  Normocephalic and atraumatic. Eyes:  Sclera clear, no icterus.  Conjunctiva pink. Ears:  Normal auditory acuity. Mouth:  No deformity or lesions.   Lungs:  Clear throughout to auscultation.   No wheezes, crackles, or rhonchi.  Heart:  Regular rate and rhythm; no M/R/G.Marland Kitchen Abdomen:  Soft, non-distended.  BS present.  Non-tender. Msk:  Symmetrical without gross deformities. Pulses:  Normal pulses noted. Extremities:  Without clubbing or edema. Neurologic:  Alert and  oriented x 4;  grossly normal neurologically. Skin:  Intact without significant lesions or rashes. Psych:  Alert and cooperative. Normal mood and affect.  Intake/Output from previous day: 10/05 0701 - 10/06 0700 In: 1436 [I.V.:486; IV Piggyback:950] Out: 200 [Urine:200]  Lab Results: Recent Labs    08/08/19 1132 08/08/19 1617 08/09/19 0537  WBC 6.7 5.4 5.9  HGB 9.1* 9.4* 9.4*  HCT 27.7* 28.2* 29.2*  PLT 231 265 250   BMET Recent Labs    08/08/19 1132 08/09/19 0537  NA 135 141  K 3.6 3.5  CL 111 115*  CO2 16* 13*  GLUCOSE 95 69*  BUN 57* 49*  CREATININE 4.43* 3.72*  CALCIUM 7.8* 7.3*   LFT Recent Labs    08/08/19 1132  PROT 5.7*  ALBUMIN 2.9*  AST 14*  ALT 14  ALKPHOS 45  BILITOT 0.4   Studies/Results: Ct Abdomen Pelvis Wo Contrast  Result Date: 08/08/2019 CLINICAL DATA:  Pt states that he continues to have blood in stool. Pt states that he has taken medication as prescribed without relief. Pt describes it as "a lot". Pt talks about pain in stomach with walking. Pt has weakness. Pt.c/o mid abdomi.*comment was truncated*Abd distension EXAM: CT ABDOMEN AND PELVIS WITHOUT CONTRAST TECHNIQUE: Multidetector CT imaging of the abdomen and pelvis was performed following the standard protocol without IV contrast. COMPARISON:  CT abdomen 07/28/2019 FINDINGS: Lower chest: Mild pleural thickening lung bases stable. No pleural effusion Hepatobiliary: Pneumobilia within the LEFT hepatic lobe again noted. Postcholecystectomy. Gas within the common bile duct. Pancreas: Pancreas is normal. No ductal dilatation. No pancreatic inflammation. Spleen: Normal spleen Adrenals/urinary tract: Adrenal glands normal. Mixed density renal cysts can of the fully characterize on noncontrast exam. No interval change. No obstructive uropathy. Bladder normal. Stomach/Bowel: Stomach  duodenum are normal. The small bowel is normal. The terminal ileum is normal. Appendix not identified. There is submucosal  edema bowel wall thickening of the ascending, transverse and sigmoid colon. The rectum appear spared. These findings are very similar to comparison CT 07/28/2019. No improvement. No oral contrast administered makes comparison difficult. No pneumatosis or portal venous gas. Venous congestion in the colonic mesentery again noted. Vascular/Lymphatic: Abdominal aorta is normal caliber with atherosclerotic calcification. There is no retroperitoneal or periportal lymphadenopathy. No pelvic lymphadenopathy. Reproductive: Prostate normal Other: No free fluid. Musculoskeletal: No aggressive osseous lesion. IMPRESSION: 1. Long segment of bowel thickening involving the ascending/ transverse and sigmoid colon. Findings are very similar to comparison CT 07/28/2019 and consistent with pancolitis. Recommend correlation for C difficile colitis or other infectious etiologies. Would also consider inflammatory bowel disease. No IV or oral contrast administered. 2. No bowel perforation.  Pneumatosis intestinalis. 3. Pneumobilia related to prior sphincterotomy again noted. Electronically Signed   By: Suzy Bouchard M.D.   On: 08/08/2019 13:53   IMPRESSION:  *85 year old male with 14 day history of diarrhea and rectal bleeding (daughter indicates it may be going on more like 4 weeks now actually).  Last colonoscopy 10 years ago.  Cdiff quick scan negative, other stool studies pending.  ? Infectious vs inflammatory vs ischemic.  No improvement in symptoms or CT scan findings despite antibiotics.  PLAN: -Tentatively planning for colonoscopy on 10/7 with Dr. Lyndel Safe.  Clear liquids today and then NPO after midnight. -Await results of remaining stool studies.   Laban Emperor. Zehr  08/09/2019, 10:44 AM    Attending physician's note   I have taken an interval history, reviewed the chart and examined the patient. I agree with the Advanced Practitioner's note, impression and recommendations.   Pancolitis - ?etiology.  Infectious vs  inflammatory vs ischemic. Neg stool for C. Diff. On azithromycin/Rocephin/Metro. CT (non contrast) x 2 reviewed.  COVID-19 neg 10/5 Weight loss (20lb over 4 weeks).  Comorbid conditions include -HTN, CKD 4.  Plan: -Await remaining stool studies. -Proceed with colonoscopy in a.m. Discussed risks and benefits with the patient and patient's wife. -Continue antibiotics for now.   Carmell Austria, MD [facility] [phone].

## 2019-08-10 ENCOUNTER — Encounter (HOSPITAL_COMMUNITY): Payer: Self-pay | Admitting: Anesthesiology

## 2019-08-10 ENCOUNTER — Inpatient Hospital Stay (HOSPITAL_COMMUNITY): Payer: Medicare Other | Admitting: Anesthesiology

## 2019-08-10 ENCOUNTER — Encounter (HOSPITAL_COMMUNITY)
Admission: EM | Disposition: A | Discharge status: Home / Self Care | Payer: Self-pay | Source: Home / Self Care | Attending: Internal Medicine

## 2019-08-10 DIAGNOSIS — K635 Polyp of colon: Secondary | ICD-10-CM

## 2019-08-10 DIAGNOSIS — R197 Diarrhea, unspecified: Secondary | ICD-10-CM

## 2019-08-10 DIAGNOSIS — K529 Noninfective gastroenteritis and colitis, unspecified: Secondary | ICD-10-CM

## 2019-08-10 HISTORY — PX: POLYPECTOMY: SHX5525

## 2019-08-10 HISTORY — PX: BIOPSY: SHX5522

## 2019-08-10 HISTORY — PX: COLONOSCOPY: SHX5424

## 2019-08-10 LAB — CBC
HCT: 29.5 % — ABNORMAL LOW (ref 39.0–52.0)
Hemoglobin: 9.4 g/dL — ABNORMAL LOW (ref 13.0–17.0)
MCH: 30.3 pg (ref 26.0–34.0)
MCHC: 31.9 g/dL (ref 30.0–36.0)
MCV: 95.2 fL (ref 80.0–100.0)
Platelets: 292 10*3/uL (ref 150–400)
RBC: 3.1 MIL/uL — ABNORMAL LOW (ref 4.22–5.81)
RDW: 14.9 % (ref 11.5–15.5)
WBC: 4.4 10*3/uL (ref 4.0–10.5)
nRBC: 0 % (ref 0.0–0.2)

## 2019-08-10 LAB — BASIC METABOLIC PANEL
Anion gap: 14 (ref 5–15)
BUN: 41 mg/dL — ABNORMAL HIGH (ref 8–23)
CO2: 16 mmol/L — ABNORMAL LOW (ref 22–32)
Calcium: 7.4 mg/dL — ABNORMAL LOW (ref 8.9–10.3)
Chloride: 114 mmol/L — ABNORMAL HIGH (ref 98–111)
Creatinine, Ser: 3.32 mg/dL — ABNORMAL HIGH (ref 0.61–1.24)
GFR calc Af Amer: 19 mL/min — ABNORMAL LOW (ref >60)
GFR calc non Af Amer: 16 mL/min — ABNORMAL LOW (ref >60)
Glucose, Bld: 116 mg/dL — ABNORMAL HIGH (ref 70–99)
Potassium: 3.1 mmol/L — ABNORMAL LOW (ref 3.5–5.1)
Sodium: 144 mmol/L (ref 135–145)

## 2019-08-10 SURGERY — COLONOSCOPY
Anesthesia: Monitor Anesthesia Care

## 2019-08-10 MED ORDER — SODIUM CHLORIDE 0.9 % IV SOLN
INTRAVENOUS | Status: DC | PRN
  Administered 2019-08-10: 13:00:00 via INTRAVENOUS

## 2019-08-10 MED ORDER — PROPOFOL 500 MG/50ML IV EMUL
INTRAVENOUS | Status: DC | PRN
  Administered 2019-08-10: 20 mg via INTRAVENOUS
  Administered 2019-08-10 (×3): 10 mg via INTRAVENOUS

## 2019-08-10 MED ORDER — PROPOFOL 500 MG/50ML IV EMUL
INTRAVENOUS | Status: DC | PRN
  Administered 2019-08-10: 100 ug/kg/min via INTRAVENOUS

## 2019-08-10 MED ORDER — POTASSIUM CHLORIDE 20 MEQ PO PACK
40.0000 meq | PACK | Freq: Every day | ORAL | Status: DC
  Administered 2019-08-11 – 2019-08-12 (×2): 40 meq via ORAL
  Filled 2019-08-10 (×2): qty 2

## 2019-08-10 MED ORDER — LACTATED RINGERS IV SOLN: INTRAVENOUS | Status: DC | PRN

## 2019-08-10 MED ORDER — PROPOFOL 10 MG/ML IV BOLUS
INTRAVENOUS | Status: AC
  Filled 2019-08-10: qty 20

## 2019-08-10 MED ORDER — POTASSIUM CHLORIDE 20 MEQ PO PACK
20.0000 meq | PACK | Freq: Once | ORAL | Status: AC
  Administered 2019-08-10: 20 meq via ORAL
  Filled 2019-08-10: qty 1

## 2019-08-10 MED ORDER — PHENYLEPHRINE HCL (PRESSORS) 10 MG/ML IV SOLN
INTRAVENOUS | Status: DC | PRN
  Administered 2019-08-10: 40 ug via INTRAVENOUS

## 2019-08-10 NOTE — Anesthesia Preprocedure Evaluation (Addendum)
Anesthesia Evaluation  Patient identified by MRN, date of birth, ID band Patient awake    Reviewed: Allergy & Precautions, NPO status , Patient's Chart, lab work & pertinent test results  Airway Mallampati: II  TM Distance: >3 FB Neck ROM: Full    Dental no notable dental hx.    Pulmonary neg pulmonary ROS, former smoker,    Pulmonary exam normal breath sounds clear to auscultation       Cardiovascular hypertension, Normal cardiovascular exam Rhythm:Regular Rate:Normal     Neuro/Psych negative neurological ROS  negative psych ROS   GI/Hepatic negative GI ROS, Neg liver ROS,   Endo/Other  negative endocrine ROS  Renal/GU CRFRenal disease  negative genitourinary   Musculoskeletal negative musculoskeletal ROS (+)   Abdominal   Peds negative pediatric ROS (+)  Hematology negative hematology ROS (+)   Anesthesia Other Findings   Reproductive/Obstetrics negative OB ROS                             Anesthesia Physical Anesthesia Plan  ASA: III  Anesthesia Plan: MAC   Post-op Pain Management:    Induction:   PONV Risk Score and Plan: 2 and Treatment may vary due to age or medical condition  Airway Management Planned: Simple Face Mask  Additional Equipment:   Intra-op Plan:   Post-operative Plan:   Informed Consent: I have reviewed the patients History and Physical, chart, labs and discussed the procedure including the risks, benefits and alternatives for the proposed anesthesia with the patient or authorized representative who has indicated his/her understanding and acceptance.     Dental advisory given  Plan Discussed with: CRNA  Anesthesia Plan Comments:        Anesthesia Quick Evaluation

## 2019-08-10 NOTE — Op Note (Signed)
[facility] Patient Name: Pedro Young Procedure Date: 08/10/2019 MRN: [id number] Attending MD: Jackquline Denmark , MD Date of Birth: Sep 18, 1933 CSN: [id number] Age: 85 Admit Type: Outpatient Procedure:                Colonoscopy Indications:              Diarrhea CT scan showing colitis. Weight loss. Providers:                Jackquline Denmark, MD, Angus Seller, Marguerita Merles,                            Technician, Lazaro Arms, Technician Referring MD:              Medicines:                Monitored Anesthesia Care Complications:            No immediate complications. Estimated Blood Loss:     Estimated blood loss: none. Procedure:                Pre-Anesthesia Assessment:                           - Prior to the procedure, a History and Physical                            was performed, and patient medications and                            allergies were reviewed. The patient's tolerance of                            previous anesthesia was also reviewed. The risks                            and benefits of the procedure and the sedation                            options and risks were discussed with the patient.                            All questions were answered, and informed consent                            was obtained. Prior Anticoagulants: The patient has                            taken no previous anticoagulant or antiplatelet                            agents. ASA Grade Assessment: III - A patient with                            severe systemic disease. After reviewing the risks  and benefits, the patient was deemed in                            satisfactory condition to undergo the procedure.                           After obtaining informed consent, the colonoscope                            was passed under direct vision. Throughout the                            procedure, the patient's blood pressure, pulse, and                    oxygen saturations were monitored continuously. The                            PCF-H190DL ([id number]) Olympus pediatric colonscope                            was introduced through the anus and advanced to the                            2 cm into the ileum. The colonoscopy was performed                            without difficulty. The patient tolerated the                            procedure well. The quality of the bowel                            preparation was fair. There was retained stool                            throughout the colon making it somewhat harder to                            visualize. Aggressive suctioning and aspiration was                            performed. Overall 70 to 75% or chronic mucosa was                            visualized satisfactorily. Of note the small and                            flat lesions could have been missed. The terminal                            ileum, ileocecal valve, appendiceal orifice, and  rectum were photographed. Scope In: 1:18:40 PM Scope Out: 1:42:58 PM Scope Withdrawal Time: 0 hours 16 minutes 23 seconds  Total Procedure Duration: 0 hours 24 minutes 18 seconds  Findings:      Patchy mild inflammation characterized by altered vascularity and       erythema was found in the entire colon, more prominently in the left       colon. There were no erosions or ulcers.. Biopsies were taken with a       cold forceps for histology from right colon and left colon, sent for       histology in separate containers. Estimated blood loss: none.      Three sessile polyps were found in the descending colon and proximal       ascending colon. The polyps were 4 to 6 mm in size. These polyps were       removed with a cold snare. Resection and retrieval were complete.      Multiple small and large-mouthed diverticula were found in the sigmoid       colon with sigmoid narrowing suggestive of muscular  hypertrophy. Few       diverticula were also visualized and descending colon and ascending       colon.      The terminal ileum appeared normal. Impression:               -Preparation of the colon was fair.                           -Likely resolving acute colitis (biopsied). Normal                            TI.                           -Colonic polyps SP polypectomy.                           -Pancolonic diverticulosis predominantly in the                            sigmoid colon. Moderate Sedation:      Not Applicable - Patient had care per Anesthesia. Recommendation:           - Return patient to hospital ward for ongoing care.                           - Resume previous diet.                           - Continue present medications.                           - If stool studies are negative for pathogens, may                            benefit from short course of steroids.                           - Await pathology results.                           -  Repeat colonoscopy in 3-6 months after a 2-day                            preparation.                           - Return to GI clinic in 12 weeks. Procedure Code(s):        --- Professional ---                           [phone], Colonoscopy, flexible; with removal of                            tumor(s), polyp(s), or other lesion(s) by snare                            technique                           [id number], 58, Colonoscopy, flexible; with biopsy,                            single or multiple Diagnosis Code(s):        --- Professional ---                           K52.9, Noninfective gastroenteritis and colitis,                            unspecified                           K63.5, Polyp of colon                           K57.30, Diverticulosis of large intestine without                            perforation or abscess without bleeding CPT copyright 2019 American Medical Association. All rights reserved. The codes documented  in this report are preliminary and upon coder review may  be revised to meet current compliance requirements. Jackquline Denmark, MD 08/10/2019 1:59:31 PM This report has been signed electronically. Number of Addenda: 0

## 2019-08-10 NOTE — Progress Notes (Signed)
  [facility] Progress Note    Assessment/ Plan:   1.  AKI on CKD IV: related to volume depletion with diarrhea.  Fortunately rapidly trending to baseline with IVFs. (baseline appears to be around 3.5)  No uremic symptoms, no indication for dialysis.  Avoid NSAIDs, IV contrast, fleets enemas, mag citrate, milk of magnesia, PPI if able.  Cr downtrending with IVFs.  On bicarb gtt.  K 3.1 this AM.  I have increased K supps to 40 mEq daily.  I recommend switching to NS when CO2 22 and switching to Na Bicarb 1300 BID at that time.  Keep K supps for now in setting of diarrhea and bicarb gtt.    2.  Pancolitis: on azithro, ceftriaxone, flagyl.  C diff neg, stool culture and GI path panel pending.  GI c/s, c-scope with resolving colitis, biopsies takes.  No renal contraindication to Golytely prep.  Hgb appears to be slightly down from 9/24 10.8--> 9.4, has been stable on serial rechecks here.  3.  HTN: on labetalol and amlodipine at home.    4.  Metabolic acidosis: IVFs as above  5.  Dispo: will sign off.  Please call back with questions.    Subjective:    Got c-scope today showing colitis that is resolving. Cr going quickly back to baseline.      Objective:   BP (!) 152/82 (BP Location: Left Arm)   Pulse 81   Temp (!) 97 F (36.1 C) Comment: MD aware  Resp 18   Ht 5\' 7"  (1.702 m)   Wt 54.4 kg   SpO2 100%   BMI 18.79 kg/m   Intake/Output Summary (Last 24 hours) at 08/10/2019 1614 Last data filed at 08/10/2019 1550 Gross per 24 hour  Intake 4262.67 ml  Output -  Net 4262.67 ml   Weight change:   Physical Exam: In endoscopy  Imaging: No results found.  Labs: BMET Recent Labs  Lab 08/08/19 1132 08/09/19 0537 08/10/19 0523  NA 135 141 144  K 3.6 3.5 3.1*  CL 111 115* 114*  CO2 16* 13* 16*  GLUCOSE 95 69* 116*  BUN 57* 49* 41*  CREATININE 4.43* 3.72* 3.32*  CALCIUM 7.8* 7.3* 7.4*   CBC Recent Labs  Lab 08/08/19 1132 08/08/19 1617 08/09/19 0537  08/10/19 0523  WBC 6.7 5.4 5.9 4.4  HGB 9.1* 9.4* 9.4* 9.4*  HCT 27.7* 28.2* 29.2* 29.5*  MCV 93.3 92.2 95.7 95.2  PLT 231 265 250 292    Medications:    . [START ON 08/11/2019] potassium chloride  40 mEq Oral Daily  . sodium chloride flush  3 mL Intravenous Q12H      Madelon Lips MD 08/10/2019, 4:14 PM

## 2019-08-10 NOTE — Progress Notes (Signed)
PROGRESS NOTE  Pedro Young  DOB: [birth year]  PCP: Gregor Hams, FNP HFW:[id number]  DOA: 08/08/2019  LOS: 2 days   Brief narrative: Pedro Young is a 85 y.o. male with past medical history of hypertension, CKD stage 4-5 who presented to the ED with complaint of non-remitting diarrhea for last 10 days. Patient first came to the ED on 9/24 with 2 days history of diarrhea. CT scan of abdomen and pelvis showed fairly significant diffuse colitis. Patient was hemodynamically stable and was discharged on ciprofloxacin and Flagyl.  Patient and family state that he took the antibiotics as advised however continued to have diarrhea.  For last 3 days he was also seeing blood mixed in the stool. Associated with constant abdominal pain, no fever. Not on a blood thinner. In the ED, patient was afebrile, blood pressure in normal range, heart rate in 70s, oxygen saturation 100% on room air. Labs showed WBC count 6.7, hemoglobin 9.1 compared to 10.8 on 9/24, platelet count 231. Sodium 135, potassium 3.6, serum bicarb 16 compared to 20 on 9/24.  BUN/creatinine 57/4.43 compared to 70/3.82 on 9/24, albumin 2.9. COVID-19 test negative. Urinalysis showed moderate amount of blood, 100 protein, no bacteria and 0-5 WBCs. Fecal occult blood test positive. C. difficile antigen and toxin negative. CT abdomen and pelvis today showed 1. Long segment of bowel thickening involving the ascending/transverse and sigmoid colon. Findings are very similar to comparison CT 07/28/2019 and consistent with pancolitis.  2. No bowel perforation. Pneumatosis intestinalis. 3. Pneumobilia related to prior sphincterotomy again noted.  Hospital consultation was called for inpatient admission and management. Prior to onset of diarrhea, patient was physically active and overall healthy for his age.  He has been less active since diarrhea started.  Subjective: Patient was seen and examined this morning.  Diarrhea had  improved yesterday.  Seen by GI and started on GoLYTELY for colonoscopy.  Planned for colonoscopy today.  WBC count improving.  No fever recurrence.  Assessment/Plan: Acute gastroenteritis Bright red blood per rectum -Presented with non-remitting symptoms for 12 days despite 10 days of oral Cipro and Flagyl. -Also associated with BRBPR for 3 days. -Infectious versus inflammatory versus ischemic etiology. -Failed treatment as an outpatient with Cipro and Flagyl.  -On admission, I started him on IV Rocephin and IV Flagyl along with azithromycin. States 75% improvement in diarrhea overnight with addition of azithromycin, I wonder if this is Campylobacter diarrhea. C. difficile negative but other stool studies pending.  -Also has high likelihood of noninfectious cause because of absence of fever, absence of leukocytosis and persistence of symptoms despite Cipro and Flagyl. -Patient is on clear liquid diet.  GI called for BRBPR.  Awaiting colonoscopy this afternoon. Hemoglobin stable at 9.4.  AKI on CKD stage IV-V Metabolic acidosis -Creatinine was 3.82 on 9/24, elevated to 4.43 at presentation, improving, 3.72 yesterday, 3.32 today.  Nephrology consultation was obtained.  Serum bicarbonate level is also improving today with bicarbonate drip -Patient already has an AV fistula in place but has not been started on dialysis. -No need of dialysis at this time.  We will keep the patient hydrated with normal saline for at least another 24 hours  Hypertension -Home meds include amlodipine and labetalol. -Meds remain on hold.  Mobility: Encourage ambulation Diet: Clear liquid diet for now, n.p.o. after midnight DVT prophylaxis: Will avoid heparin or Lovenox because of bleeding.  SCD boots.  Code Status: Full code Family Communication: Wife at bedside Disposition Plan:  Continue inpatient management  Consultants:  GI  Nephrology  Procedures:  None  Antimicrobials: Anti-infectives  (From admission, onward)   Start     Dose/Rate Route Frequency Ordered Stop   08/08/19 1700  [MAR Hold]  azithromycin (ZITHROMAX) 500 mg in sodium chloride 0.9 % 250 mL IVPB     (MAR Hold since Wed 08/10/2019 at 1218.Hold Reason: Transfer to a Procedural area.)   500 mg 250 mL/hr over 60 Minutes Intravenous Every 24 hours 08/08/19 1559     08/08/19 1645  [MAR Hold]  cefTRIAXone (ROCEPHIN) 2 g in sodium chloride 0.9 % 100 mL IVPB     (MAR Hold since Wed 08/10/2019 at 1218.Hold Reason: Transfer to a Procedural area.)   2 g 200 mL/hr over 30 Minutes Intravenous Every 24 hours 08/08/19 1559     08/08/19 1600  [MAR Hold]  metroNIDAZOLE (FLAGYL) IVPB 500 mg     (MAR Hold since Wed 08/10/2019 at 1218.Hold Reason: Transfer to a Procedural area.)   500 mg 100 mL/hr over 60 Minutes Intravenous Every 8 hours 08/08/19 1559        Diet Order            Diet NPO time specified  Diet effective midnight              Infusions:  . [MAR Hold] azithromycin 500 mg (08/09/19 1939)  . [MAR Hold] cefTRIAXone (ROCEPHIN)  IV 2 g (08/09/19 2119)  . [MAR Hold] metronidazole 500 mg (08/10/19 1047)  . sodium bicarbonate 150 mEq in dextrose 5% 1000 mL 150 mEq (08/10/19 0616)    Scheduled Meds: . [MAR Hold] potassium chloride  20 mEq Oral Daily  . [MAR Hold] sodium chloride flush  3 mL Intravenous Q12H    PRN meds: [MAR Hold] acetaminophen **OR** [MAR Hold] acetaminophen, [MAR Hold] ondansetron **OR** [MAR Hold] ondansetron (ZOFRAN) IV, [MAR Hold] sorbitol   Objective: Vitals:   08/10/19 0635 08/10/19 1223  BP: 139/74 (!) 161/66  Pulse: 87 98  Resp: 20 19  Temp: 97.8 F (36.6 C) (!) 97.5 F (36.4 C)  SpO2: 100% 100%    Intake/Output Summary (Last 24 hours) at 08/10/2019 1307 Last data filed at 08/10/2019 0617 Gross per 24 hour  Intake 3390.85 ml  Output -  Net 3390.85 ml   Filed Weights   08/08/19 1045 08/08/19 1831 08/10/19 1223  Weight: 57 kg 53.5 kg 54.4 kg   Weight change:  Body mass  index is 18.79 kg/m.   Physical Exam: General exam: Appears calm and comfortable.  Skin: No rashes, lesions or ulcers. HEENT: Atraumatic, normocephalic, supple neck, no obvious bleeding Lungs: Clear to auscultation bilaterally CVS: Regular rate and rhythm, no murmur GI/Abd soft, nondistended, improving tenderness in lower abdomen, bowel sound present CNS: Alert, awake, oriented x3 Psychiatry: Mood appropriate, judgment and insight clear Extremities: No pedal edema, no calf tenderness  Data Review: I have personally reviewed the laboratory data and studies available.  Recent Labs  Lab 08/08/19 1132 08/08/19 1617 08/09/19 0537 08/10/19 0523  WBC 6.7 5.4 5.9 4.4  HGB 9.1* 9.4* 9.4* 9.4*  HCT 27.7* 28.2* 29.2* 29.5*  MCV 93.3 92.2 95.7 95.2  PLT 231 265 250 292   Recent Labs  Lab 08/08/19 1132 08/09/19 0537 08/10/19 0523  NA 135 141 144  K 3.6 3.5 3.1*  CL 111 115* 114*  CO2 16* 13* 16*  GLUCOSE 95 69* 116*  BUN 57* 49* 41*  CREATININE 4.43* 3.72* 3.32*  CALCIUM 7.8* 7.3* 7.4*    Terrilee Croak, MD  [facility] 08/10/2019

## 2019-08-10 NOTE — Transfer of Care (Signed)
Immediate Anesthesia Transfer of Care Note  Patient: Pedro Young  Procedure(s) Performed: COLONOSCOPY (N/A ) BIOPSY POLYPECTOMY  Patient Location: PACU  Anesthesia Type:MAC  Level of Consciousness: awake, alert  and oriented  Airway & Oxygen Therapy: Patient Spontanous Breathing and Patient connected to face mask oxygen  Post-op Assessment: Report given to RN  Post vital signs: Reviewed and stable  Last Vitals:  Vitals Value Taken Time  BP    Temp    Pulse    Resp    SpO2      Last Pain:  Vitals:   08/10/19 1223  TempSrc: Oral  PainSc: 0-No pain         Complications: No apparent anesthesia complications

## 2019-08-10 NOTE — Progress Notes (Signed)
Patient c/o L sided chest pain that "feels like something is compressed".  MD notified and he will order EKG and Troponin.

## 2019-08-10 NOTE — Interval H&P Note (Signed)
History and Physical Interval Note:  08/10/2019 1:08 PM  Pedro Young  has presented today for surgery, with the diagnosis of Diarrhea and rectal bleeding, abnormal CT scan with colitis.  The various methods of treatment have been discussed with the patient and family. After consideration of risks, benefits and other options for treatment, the patient has consented to  Procedure(s): COLONOSCOPY (N/A) as a surgical intervention.  The patient's history has been reviewed, patient examined, no change in status, stable for surgery.  I have reviewed the patient's chart and labs.  Questions were answered to the patient's satisfaction.     Jackquline Denmark

## 2019-08-11 ENCOUNTER — Encounter (HOSPITAL_COMMUNITY): Payer: Self-pay | Admitting: Gastroenterology

## 2019-08-11 LAB — GI PATHOGEN PANEL BY PCR, STOOL

## 2019-08-11 LAB — CBC
HCT: 24.6 % — ABNORMAL LOW (ref 39.0–52.0)
Hemoglobin: 8.4 g/dL — ABNORMAL LOW (ref 13.0–17.0)
MCH: 31 pg (ref 26.0–34.0)
MCHC: 34.1 g/dL (ref 30.0–36.0)
MCV: 90.8 fL (ref 80.0–100.0)
Platelets: 273 10*3/uL (ref 150–400)
RBC: 2.71 MIL/uL — ABNORMAL LOW (ref 4.22–5.81)
RDW: 14.6 % (ref 11.5–15.5)
WBC: 4.5 10*3/uL (ref 4.0–10.5)
nRBC: 0 % (ref 0.0–0.2)

## 2019-08-11 LAB — BASIC METABOLIC PANEL
Anion gap: 8 (ref 5–15)
BUN: 30 mg/dL — ABNORMAL HIGH (ref 8–23)
CO2: 24 mmol/L (ref 22–32)
Calcium: 7.1 mg/dL — ABNORMAL LOW (ref 8.9–10.3)
Chloride: 114 mmol/L — ABNORMAL HIGH (ref 98–111)
Creatinine, Ser: 2.88 mg/dL — ABNORMAL HIGH (ref 0.61–1.24)
GFR calc Af Amer: 22 mL/min — ABNORMAL LOW (ref >60)
GFR calc non Af Amer: 19 mL/min — ABNORMAL LOW (ref >60)
Glucose, Bld: 132 mg/dL — ABNORMAL HIGH (ref 70–99)
Potassium: 2.9 mmol/L — ABNORMAL LOW (ref 3.5–5.1)
Sodium: 146 mmol/L — ABNORMAL HIGH (ref 135–145)

## 2019-08-11 LAB — SURGICAL PATHOLOGY

## 2019-08-11 MED ORDER — AMLODIPINE BESYLATE 10 MG PO TABS
10.0000 mg | ORAL_TABLET | Freq: Every day | ORAL | Status: DC
  Administered 2019-08-11: 10 mg via ORAL
  Filled 2019-08-11: qty 1

## 2019-08-11 MED ORDER — LABETALOL HCL 100 MG PO TABS
100.0000 mg | ORAL_TABLET | Freq: Two times a day (BID) | ORAL | Status: DC
  Administered 2019-08-11 – 2019-08-12 (×2): 100 mg via ORAL
  Filled 2019-08-11 (×3): qty 1

## 2019-08-11 MED ORDER — POTASSIUM CHLORIDE CRYS ER 20 MEQ PO TBCR
40.0000 meq | EXTENDED_RELEASE_TABLET | Freq: Once | ORAL | Status: AC
  Administered 2019-08-11: 40 meq via ORAL
  Filled 2019-08-11: qty 2

## 2019-08-11 MED ORDER — SODIUM BICARBONATE 650 MG PO TABS
1300.0000 mg | ORAL_TABLET | Freq: Two times a day (BID) | ORAL | Status: DC
  Administered 2019-08-11 – 2019-08-12 (×3): 1300 mg via ORAL
  Filled 2019-08-11 (×3): qty 2

## 2019-08-11 MED ORDER — DEXTROSE-NACL 5-0.45 % IV SOLN
INTRAVENOUS | Status: DC
  Administered 2019-08-11 – 2019-08-12 (×2): via INTRAVENOUS

## 2019-08-11 NOTE — Anesthesia Postprocedure Evaluation (Signed)
Anesthesia Post Note  Patient: Pedro Young  Procedure(s) Performed: COLONOSCOPY (N/A ) BIOPSY POLYPECTOMY     Patient location during evaluation: PACU Anesthesia Type: MAC Level of consciousness: awake and alert Pain management: pain level controlled Vital Signs Assessment: post-procedure vital signs reviewed and stable Respiratory status: spontaneous breathing, nonlabored ventilation, respiratory function stable and patient connected to nasal cannula oxygen Cardiovascular status: stable and blood pressure returned to baseline Postop Assessment: no apparent nausea or vomiting Anesthetic complications: no    Last Vitals:  Vitals:   08/10/19 2014 08/11/19 0554  BP: 121/64 131/69  Pulse: 91 74  Resp: 16 16  Temp: 36.6 C 36.5 C  SpO2: 99% 98%    Last Pain:  Vitals:   08/11/19 0807  TempSrc:   PainSc: 0-No pain                 Montez Hageman

## 2019-08-11 NOTE — Progress Notes (Signed)
PROGRESS NOTE  Pedro Young  DOB: 1932/12/10  PCP: Gregor Hams, FNP HBZ:[id number]  DOA: 08/08/2019  LOS: 3 days   Brief narrative: Pedro Young is a 85 y.o. male with past medical history of hypertension, CKD stage 4-5 who presented to the ED with complaint of non-remitting diarrhea for last 10 days. Patient first came to the ED on 9/24 with 2 days history of diarrhea. CT scan of abdomen and pelvis showed fairly significant diffuse colitis. Patient was hemodynamically stable and was discharged on ciprofloxacin and Flagyl.  Patient and family state that he took the antibiotics as advised however continued to have diarrhea.  For last 3 days he was also seeing blood mixed in the stool. Associated with constant abdominal pain, no fever. Not on a blood thinner. In the ED, patient was afebrile, blood pressure in normal range, heart rate in 70s, oxygen saturation 100% on room air. Labs showed WBC count 6.7, hemoglobin 9.1 compared to 10.8 on 9/24, platelet count 231. Sodium 135, potassium 3.6, serum bicarb 16 compared to 20 on 9/24.  BUN/creatinine 57/4.43 compared to 70/3.82 on 9/24, albumin 2.9. COVID-19 test negative. Urinalysis showed moderate amount of blood, 100 protein, no bacteria and 0-5 WBCs. Fecal occult blood test positive. C. difficile antigen and toxin negative. CT abdomen and pelvis today showed 1. Long segment of bowel thickening involving the ascending/transverse and sigmoid colon. Findings are very similar to comparison CT 07/28/2019 and consistent with pancolitis.  2. No bowel perforation. Pneumatosis intestinalis. 3. Pneumobilia related to prior sphincterotomy again noted.  Hospital consultation was called for inpatient admission and management. Prior to onset of diarrhea, patient was physically active and overall healthy for his age.  He has been less active since diarrhea started.  Subjective: Patient was seen and examined this morning.  Diarrhea has  significantly improved.  He underwent colonoscopy yesterday.  Detailed findings in GI note. Had one episode of loose stool this morning with blood.   WBC count improving.  No fever recurrence.  Assessment/Plan: Acute gastroenteritis Bright red blood per rectum -Presented with non-remitting symptoms for 12 days despite 10 days of oral Cipro and Flagyl. -Also associated with BRBPR for 3 days. -Infectious versus inflammatory versus ischemic etiology. -Failed treatment as an outpatient with Cipro and Flagyl.  -On admission, I started him on IV Rocephin and IV Flagyl along with azithromycin.  Diarrhea has improved.  Negative for C. difficile.  Other stool studies sent on 10/5 are still not reported.   -Underwent colonoscopy on 10/7.  Per procedure note, patient likely has acute colitis which is resolving.  Colonic polyps were excised.  Patient has pancolonic diverticulosis predominantly in the sigmoid colon. -No active bleeding was noted but patient continues to have BRBPR, likely from colitis itself. -Await biopsies.  If stool studies are negative, a short course of steroid recommended by GI.  AKI on CKD stage IV-V Metabolic acidosis -Creatinine was 3.82 on 9/24, elevated to 4.43 at presentation, improving steadily with hydration, 2.88 today.  -Serum bicarb level dipped down as low as 13.  Nephrology consultation appreciated.  Patient was started on serum bicarbonate drip.  Bicarbonate level improved today to 24.  I stopped the drip and started on oral sodium bicarb 1300 mg twice daily as recommended.   -Patient already has an AV fistula in place but has not been started on dialysis.  Hypokalemia -Potassium level low at 2.9 today.  Oral and IV replacement ordered.  Repeat tomorrow.  Hypernatremia -Sodium level slightly elevated to  146 today. -I switched fluid from sodium bicarb drip to D5 half NS.  Hypertension -Home meds include amlodipine and labetalol. -Meds remain on hold. -Blood  pressures seem to be trending up now.  Plan to resume these meds from tomorrow morning.  Mobility: Encourage ambulation Diet: Cardiac diet DVT prophylaxis: Will avoid heparin or Lovenox because of bleeding.  SCD boots.  Code Status: Full code Family Communication: Wife at bedside Disposition Plan:  Continue inpatient management  Consultants:  GI  Nephrology  Procedures:  None  Antimicrobials: Anti-infectives (From admission, onward)   Start     Dose/Rate Route Frequency Ordered Stop   08/08/19 1700  azithromycin (ZITHROMAX) 500 mg in sodium chloride 0.9 % 250 mL IVPB     500 mg 250 mL/hr over 60 Minutes Intravenous Every 24 hours 08/08/19 1559     08/08/19 1645  cefTRIAXone (ROCEPHIN) 2 g in sodium chloride 0.9 % 100 mL IVPB     2 g 200 mL/hr over 30 Minutes Intravenous Every 24 hours 08/08/19 1559     08/08/19 1600  metroNIDAZOLE (FLAGYL) IVPB 500 mg     500 mg 100 mL/hr over 60 Minutes Intravenous Every 8 hours 08/08/19 1559        Diet Order            Diet Heart Room service appropriate? Yes; Fluid consistency: Thin  Diet effective now              Infusions:  . azithromycin 250 mL/hr at 08/10/19 1926  . cefTRIAXone (ROCEPHIN)  IV 2 g (08/10/19 2111)  . dextrose 5 % and 0.45% NaCl 75 mL/hr at 08/11/19 1040  . metronidazole Stopped (08/11/19 1696)    Scheduled Meds: . potassium chloride  40 mEq Oral Daily  . sodium bicarbonate  1,300 mg Oral BID  . sodium chloride flush  3 mL Intravenous Q12H    PRN meds: acetaminophen **OR** acetaminophen, ondansetron **OR** ondansetron (ZOFRAN) IV, sorbitol   Objective: Vitals:   08/11/19 0554 08/11/19 1405  BP: 131/69 (!) 143/74  Pulse: 74 88  Resp: 16 18  Temp: 97.7 F (36.5 C) 98.2 F (36.8 C)  SpO2: 98% 100%    Intake/Output Summary (Last 24 hours) at 08/11/2019 1451 Last data filed at 08/11/2019 1300 Gross per 24 hour  Intake 3336.32 ml  Output 675 ml  Net 2661.32 ml   Filed Weights    08/08/19 1045 08/08/19 1831 08/10/19 1223  Weight: 57 kg 53.5 kg 54.4 kg   Weight change:  Body mass index is 18.79 kg/m.   Physical Exam: General exam: Appears calm and comfortable.  Skin: No rashes, lesions or ulcers. HEENT: Atraumatic, normocephalic, supple neck, no obvious bleeding Lungs: Clear to auscultation bilaterally CVS: Regular rate and rhythm, no murmur GI/Abd soft, nondistended, improving tenderness in lower abdomen, bowel sound present CNS: Alert, awake, oriented x3 Psychiatry: Mood appropriate, judgment and insight clear Extremities: No pedal edema, no calf tenderness  Data Review: I have personally reviewed the laboratory data and studies available.  Recent Labs  Lab 08/08/19 1132 08/08/19 1617 08/09/19 0537 08/10/19 0523 08/11/19 0522  WBC 6.7 5.4 5.9 4.4 4.5  HGB 9.1* 9.4* 9.4* 9.4* 8.4*  HCT 27.7* 28.2* 29.2* 29.5* 24.6*  MCV 93.3 92.2 95.7 95.2 90.8  PLT 231 265 250 292 273   Recent Labs  Lab 08/08/19 1132 08/09/19 0537 08/10/19 0523 08/11/19 0522  NA 135 141 144 146*  K 3.6 3.5 3.1* 2.9*  CL 111 115* 114* 114*  CO2 16* 13* 16* 24  GLUCOSE 95 69* 116* 132*  BUN 57* 49* 41* 30*  CREATININE 4.43* 3.72* 3.32* 2.88*  CALCIUM 7.8* 7.3* 7.4* 7.1*    Terrilee Croak, MD  [facility] 08/11/2019

## 2019-08-11 NOTE — Progress Notes (Addendum)
     [facility] Progress Note  CC:  GI bleeding, diarrhea, colitis  Subjective:  Feeling much better.  Did well with dinner last night and breakfast this AM.  No diarrhea, just a small amount of stool with a small amount of blood.  No abdominal pain.  Colonoscopy 10/7 as follows:  -Preparation of the colon was fair. -Likely resolving acute colitis (biopsied). Normal TI. -Colonic polyps SP polypectomy. -Pancolonic diverticulosis predominantly in the sigmoid colon.  Biopsies pending as are the remaining stool studies.  K+ 2.9 this AM and Hgb down to 8.4 grams from 9.4 grams yesterday.  Objective:  Vital signs in last 24 hours: Temp:  [96.8 F (36 C)-97.8 F (36.6 C)] 97.7 F (36.5 C) (10/08 0554) Pulse Rate:  [74-105] 74 (10/08 0554) Resp:  [12-19] 16 (10/08 0554) BP: (90-161)/(37-86) 131/69 (10/08 0554) SpO2:  [98 %-100 %] 98 % (10/08 0554) Weight:  [54.4 kg] 54.4 kg (10/07 1223) Last BM Date: 08/11/19 General:  Alert, Well-developed, in NAD Heart:  Regular rate and rhythm; no murmurs Pulm:  CTAB.  No increased WOB. Abdomen:  Soft, non-distended.  BS present.  Non-tender. Extremities:  Without edema. Neurologic:  Alert and oriented x 4;  grossly normal neurologically. Psych:  Alert and cooperative. Normal mood and affect.  Intake/Output from previous day: 10/07 0701 - 10/08 0700 In: 2570.8 [P.O.:480; I.V.:1440.8; IV Piggyback:650] Out: 500 [Urine:500]  Lab Results: Recent Labs    08/09/19 0537 08/10/19 0523 08/11/19 0522  WBC 5.9 4.4 4.5  HGB 9.4* 9.4* 8.4*  HCT 29.2* 29.5* 24.6*  PLT 250 292 273   BMET Recent Labs    08/09/19 0537 08/10/19 0523 08/11/19 0522  NA 141 144 146*  K 3.5 3.1* 2.9*  CL 115* 114* 114*  CO2 13* 16* 24  GLUCOSE 69* 116* 132*  BUN 49* 41* 30*  CREATININE 3.72* 3.32* 2.88*  CALCIUM 7.3* 7.4* 7.1*   LFT Recent Labs    08/08/19 1132  PROT 5.7*  ALBUMIN 2.9*  AST 14*  ALT 14  ALKPHOS 45  BILITOT 0.4    Assessment / Plan: *85 year old male with 14 day history of diarrhea and rectal bleeding (daughter indicates it may be going on more like 4 weeks now actually).  Cdiff quick scan negative, other stool studies pending.  ? Infectious vs inflammatory vs ischemic.  No improvement in symptoms or CT scan findings despite antibiotics.  Colonoscopy 10/7 showed likely resolving acute colitis; biopsies obtained.  Currently on azithromycin, rocephin, and flagyl.  Doing well now and tolerating normal diet. --Await biopsy results. --Await remaining stool studies.  Pending results may be able to narrow coverage or if negative then discontinue altogether. *Hypokalemia:  2.9 this AM.  Replacement per primary service.  **If K+ looks good tomorrow then ok for discharge in AM from GI standpoint.    LOS: 3 days   Pedro Young. Zehr  08/11/2019, 9:38 AM     Attending physician's note   I have taken an interval history, reviewed the chart and examined the patient. I agree with the Advanced Practitioner's note, impression and recommendations.   No further diarrhea.  Acute resolving colitis (likely infectious). Bx -pending.  Stool studies pending.  Plan: -Correct electrolytes. -Advance diet. -Anticipate discharge in a.m. -Can stop IV antibiotics at discharge. Can continue PO Cipro 500 BID x 3 days -FU GI clinic in 6-8 weeks. -We will sign off for now.   Carmell Austria, MD [facility] [phone].

## 2019-08-11 NOTE — Plan of Care (Signed)

## 2019-08-12 DIAGNOSIS — R197 Diarrhea, unspecified: Secondary | ICD-10-CM

## 2019-08-12 LAB — CBC WITH DIFFERENTIAL/PLATELET
Abs Immature Granulocytes: 0.04 10*3/uL (ref 0.00–0.07)
Basophils Absolute: 0 10*3/uL (ref 0.0–0.1)
Basophils Relative: 0 %
Eosinophils Absolute: 0.1 10*3/uL (ref 0.0–0.5)
Eosinophils Relative: 2 %
HCT: 24.3 % — ABNORMAL LOW (ref 39.0–52.0)
Hemoglobin: 8 g/dL — ABNORMAL LOW (ref 13.0–17.0)
Immature Granulocytes: 1 %
Lymphocytes Relative: 23 %
Lymphs Abs: 1.3 10*3/uL (ref 0.7–4.0)
MCH: 30.5 pg (ref 26.0–34.0)
MCHC: 32.9 g/dL (ref 30.0–36.0)
MCV: 92.7 fL (ref 80.0–100.0)
Monocytes Absolute: 0.6 10*3/uL (ref 0.1–1.0)
Monocytes Relative: 10 %
Neutro Abs: 3.7 10*3/uL (ref 1.7–7.7)
Neutrophils Relative %: 64 %
Platelets: 271 10*3/uL (ref 150–400)
RBC: 2.62 MIL/uL — ABNORMAL LOW (ref 4.22–5.81)
RDW: 14.9 % (ref 11.5–15.5)
WBC: 5.8 10*3/uL (ref 4.0–10.5)
nRBC: 0 % (ref 0.0–0.2)

## 2019-08-12 LAB — BASIC METABOLIC PANEL
Anion gap: 11 (ref 5–15)
BUN: 24 mg/dL — ABNORMAL HIGH (ref 8–23)
CO2: 22 mmol/L (ref 22–32)
Calcium: 7.1 mg/dL — ABNORMAL LOW (ref 8.9–10.3)
Chloride: 111 mmol/L (ref 98–111)
Creatinine, Ser: 2.56 mg/dL — ABNORMAL HIGH (ref 0.61–1.24)
GFR calc Af Amer: 26 mL/min — ABNORMAL LOW (ref >60)
GFR calc non Af Amer: 22 mL/min — ABNORMAL LOW (ref >60)
Glucose, Bld: 120 mg/dL — ABNORMAL HIGH (ref 70–99)
Potassium: 3.5 mmol/L (ref 3.5–5.1)
Sodium: 144 mmol/L (ref 135–145)

## 2019-08-12 LAB — MAGNESIUM: Magnesium: 1.7 mg/dL (ref 1.7–2.4)

## 2019-08-12 MED ORDER — CIPROFLOXACIN HCL 500 MG PO TABS: 500.0000 mg | ORAL_TABLET | Freq: Two times a day (BID) | ORAL | 0 refills | Status: AC

## 2019-08-12 MED ORDER — SODIUM BICARBONATE 650 MG PO TABS: 1300.0000 mg | ORAL_TABLET | Freq: Two times a day (BID) | ORAL | 0 refills | Status: DC

## 2019-08-12 NOTE — Care Management Important Message (Signed)
Important Message  Patient Details IM Letter given to Sharren Bridge SW to present to the Patient Name: Pedro Young MRN: [id number] Date of Birth: October 22, 1933   Medicare Important Message Given:  Yes     [name] 08/12/2019, 11:33 AM

## 2019-08-12 NOTE — Discharge Instructions (Signed)
Bloody Diarrhea  Bloody diarrhea is frequent loose and watery bowel movements that contain blood. The blood can be hard to see or notice (occult). Bloody diarrhea may be caused by medical conditions such as:  · Ulcerative colitis.  · Crohn's disease.  · Intestinal infection.  · Viral gastroenteritis or bacterial gastroenteritis.  Finding out why there is blood in your diarrhea is necessary so that your health care provider can prescribe the right treatment for you. Follow the instructions from your health care provider about treating the cause of your bloody diarrhea.  Any type of diarrhea can make you feel weak and dehydrated. Dehydration can make you tired and thirsty, cause you to have a dry mouth, and decrease how often you urinate.  Follow these instructions at home:  Eating and drinking         Follow these recommendations as told by your health care provider:  · Take an oral rehydration solution (ORS). This is an over-the-counter medicine that helps return your body to its normal balance of nutrients and water. It is found at pharmacies and retail stores.  · Drink enough fluid to keep your urine pale yellow.  ? Drink fluids such as water, ice chips, diluted fruit juice, and low-calorie sports drinks. You can also drink milk products, if desired.  ? Avoid drinking fluids that contain a lot of sugar or caffeine, such as energy drinks, regular sports drinks, and soda.  ? Avoid alcohol.  · Eat bland, easy-to-digest foods in small amounts as you are able. These foods include bananas, applesauce, rice, lean meats, toast, and crackers.  · Avoid spicy or fatty foods.    Medicines  · Take over-the-counter and prescription medicines only as told by your health care provider.  ? Your health care provider may prescribe medicine to slow down the frequency of diarrhea or to ease stomach discomfort.  · If you were prescribed an antibiotic medicine, take it as told by your health care provider. Do not stop using the  antibiotic even if you start to feel better.  General instructions    · Wash your hands often using soap and water. If soap and water are not available, use a hand sanitizer. Others in the household should wash their hands as well. Hands should be washed:  ? After using the toilet or changing a diaper.  ? Before preparing, cooking, or serving food.  ? While caring for a sick person or while visiting someone in a hospital.  · Rest at home while you recover.  · Take a warm bath to relieve any burning or pain from frequent diarrhea episodes.  · Watch your condition for any changes.  · Keep all follow-up visits as told by your health care provider. This is important.  Contact a health care provider if:  · You have a fever.  · Your diarrhea gets worse.  · You have new symptoms.  · You cannot keep fluids down.  · You feel light-headed or dizzy.  · You have a headache.  · You have muscle cramps.  Get help right away if:  · You have chest pain.  · You feel extremely weak or you faint.  · The blood in your diarrhea increases or turns a different color.  · You vomit and the vomit is bloody or looks black.  · You have persistent diarrhea.  · You have severe pain, cramping, or bloating in your abdomen.  · You have trouble breathing or you are breathing very   Bloody diarrhea is frequent loose and watery bowel movements that contain blood. The blood can be hard to see or notice (occult).  Follow the instructions from your health care provider about treating the cause of your bloody diarrhea.  Any type of diarrhea can make you feel weak and dehydrated.  Follow your health care provider's recommendations for eating and drinking and for taking  medicines.  Contact your health care provider if your symptoms get worse. Get help right away if you have signs of dehydration. This information is not intended to replace advice given to you by your health care provider. Make sure you discuss any questions you have with your health care provider. Document Released: 10/20/2005 Document Revised: 04/01/2018 Document Reviewed: 04/01/2018 Elsevier Patient Education  2020 [employer].

## 2019-08-12 NOTE — Plan of Care (Signed)
  Problem: Nutrition: Goal: Adequate nutrition will be maintained Outcome: Progressing   Problem: Pain Managment: Goal: General experience of comfort will improve Outcome: Progressing   Problem: Safety: Goal: Ability to remain free from injury will improve Outcome: Progressing   

## 2019-08-12 NOTE — Discharge Summary (Signed)
Physician Discharge Summary  Pedro Young DHR:[id number] DOB: [birth year] DOA: 08/08/2019  PCP: Gregor Hams, FNP  Admit date: 08/08/2019 Discharge date: 08/12/2019  Admitted From: Home Discharge disposition: Home   Code Status: Full Code  Diet Recommendation: Cardiac diet   Recommendations for Outpatient Follow-Up:   1. Follow-up with GI as an outpatient 2. Follow-up with nephrology as an outpatient.  Discharge Diagnosis:   Active Problems:   Acute gastroenteritis   Bloody diarrhea   Colitis    History of Present Illness / Brief narrative:  Pedro Young a 85 y.o.malewith past medical history of hypertension, CKD stage4-5who presented to the ED with complaint of non-remitting diarrhea for last 10 days. Patient first came to the ED on 9/24 with 2 days history of diarrhea. CT scan of abdomen and pelvis showed fairly significant diffuse colitis. Patient was hemodynamically stable and was discharged on ciprofloxacin and Flagyl. Patient and family state that he took the antibiotics as advised however continued to have diarrhea. For last 3 days he was also seeing blood mixed in the stool. Associated with constant abdominal pain,no fever. Not on a blood thinner. In the ED, patient was afebrile, blood pressure in normal range, heart rate in 70s, oxygen saturation 100% on room air. Labs showed WBC count 6.7, hemoglobin 9.1 compared to 10.8 on 9/24, platelet count 231. Sodium 135, potassium 3.6, serum bicarb 16 compared to 20 on 9/24. BUN/creatinine 57/4.43 compared to 70/3.82 on 9/24, albumin 2.9. COVID-19 test negative. Urinalysis showed moderate amount of blood, 100 protein,no bacteria and 0-5 WBCs. Fecal occult blood test positive. C. difficile antigen and toxin negative. CT abdomen and pelvis today showed 1. Long segment of bowel thickening involving the ascending/transverse and sigmoid colon. Findings are very similar to comparison CT 07/28/2019 and  consistent with pancolitis.  2. No bowel perforation. Pneumatosis intestinalis. 3. Pneumobilia related to prior sphincterotomy again noted.  Hospital consultation was called for inpatient admission and management. Prior to onset of diarrhea, patient was physically active and overall healthy for his age. He has been less active since diarrhea started.   Subjective:  Patient was seen and examined this morning.  Elderly male.  Not in distress.  Diarrhea improving.  Feels ready to go home today.  Hospital Course:  Acute gastroenteritis Bright red blood per rectum -Presented with non-remitting symptoms for 12 days despite 10 days of oral Cipro and Flagyl. -Also associated with BRBPR for 3 days. -Infectious versus inflammatory versus ischemic etiology. -Failed treatment as an outpatient with Cipro and Flagyl.  -On admission, he was started on IV Rocephin, IV Flagyl and IV azithromycin.   -Underwent colonoscopy on 10/7.  Per procedure note, patient likely has acute infectious colitis which is resolving.  Colonic polyps were excised.  Patient also has pancolonic diverticulosis predominantly in the sigmoid colon. -No active bleeding was noted, patient likely has bleeding from colitis itself. -Awaiting biopsies. -Diarrhea has improved.  Negative for C. Difficile, campylobacter or any other pathogen. - As recommeded by GI, will discharge the patient on oral ciprofloxacin 5 mg twice daily for 3 more days.   AKI on CKD stage IV-V Metabolic acidosis -Creatinine was 3.82 on 9/24, elevated to 4.43 at presentation, improving steadily with hydration, 2.56 today.  -Serum bicarb level dipped down as low as 13 in the hospital.  Nephrology consultation appreciated.  Patient was started on serum bicarbonate drip.  Bicarbonate level improved today to 24.    Bicarbonate drip was stopped and patient was started on oral sodium  bicarb 1300 mg twice daily as recommended.  I discussed with nephrologist Dr. Hollie Salk  this morning.  Recommended to discharge the patient on same dose of bicarbonate.  He will follow-up with his nephrologist Dr. Lorrene Reid  as an outpatient for further adjustment and blood work. -Patient already has an AV fistula in place but has not been started on dialysis.  Hypokalemia -Potassium level dipped down as low as 2.9 due to diarrhea.  Improved with replacement.  Hypertension -Home meds include amlodipine and labetalol. -Both resumed from this morning.  Patient has 1+ bilateral pedal edema as well.  He states he takes a fluid pill at home but I do not have that in the list of medication.  He will resume the same at home.  Stable for discharge to home today.  Discharge Exam:   Vitals:   08/11/19 1405 08/11/19 1956 08/12/19 0509 08/12/19 0941  BP: (!) 143/74 (!) 147/84 135/67 131/68  Pulse: 88 92 79 78  Resp: 18 20 17    Temp: 98.2 F (36.8 C) 98.3 F (36.8 C) 98.3 F (36.8 C)   TempSrc:      SpO2: 100% 100% 100% 98%  Weight:      Height:        Body mass index is 18.79 kg/m.  General exam: Appears calm and comfortable.  Skin: No rashes, lesions or ulcers. HEENT: Atraumatic, normocephalic, supple neck, no obvious bleeding Lungs: Clear to auscultation bilaterally CVS: Regular rate and rhythm, no murmur GI/Abd soft, nontender, nondistended, bowel sound present CNS: Alert, awake, oriented x3 Psychiatry: Mood appropriate Extremities: Pedal edema trace bilaterally, right more than left  Discharge Instructions:  Wound care: None Discharge Instructions    Increase activity slowly   Complete by: As directed       Allergies as of 08/12/2019      Reactions   Amoxicillin-pot Clavulanate Other (See Comments)   Did it involve swelling of the face/tongue/throat, SOB, or low BP? Unknown Did it involve sudden or severe rash/hives, skin peeling, or any reaction on the inside of your mouth or nose? Unknown Did you need to seek medical attention at a hospital or doctor's  office? Unknown When did it last happen?patient is unfamiliar with this allergy If all above answers are NO, may proceed with cephalosporin use.   Atorvastatin    REACTION: arthralgia   Lovastatin    REACTION: arthralgia      Medication List    STOP taking these medications   metroNIDAZOLE 500 MG tablet Commonly known as: FLAGYL     TAKE these medications   amLODipine 10 MG tablet Commonly known as: NORVASC Take 10 mg by mouth at bedtime.   ciprofloxacin 500 MG tablet Commonly known as: CIPRO Take 1 tablet (500 mg total) by mouth 2 (two) times daily for 3 days.   FISH OIL PO Take 1,500 mg by mouth daily.   furosemide 40 MG tablet Commonly known as: LASIX Take 40 mg by mouth daily as needed for fluid or edema. Prescribed by Dr Lorrene Reid [facility]) since May 2016   labetalol 100 MG tablet Commonly known as: NORMODYNE Take 100 mg by mouth 2 (two) times daily.   sodium bicarbonate 650 MG tablet Take 2 tablets (1,300 mg total) by mouth 2 (two) times daily.       Time coordinating discharge: 35 minutes  The results of significant diagnostics from this hospitalization (including imaging, microbiology, ancillary and laboratory) are listed below for reference.    Procedures and Diagnostic  Studies:   Ct Abdomen Pelvis Wo Contrast  Result Date: 08/08/2019 CLINICAL DATA:  Pt states that he continues to have blood in stool. Pt states that he has taken medication as prescribed without relief. Pt describes it as "a lot". Pt talks about pain in stomach with walking. Pt has weakness. Pt.c/o mid abdomi.*comment was truncated*Abd distension EXAM: CT ABDOMEN AND PELVIS WITHOUT CONTRAST TECHNIQUE: Multidetector CT imaging of the abdomen and pelvis was performed following the standard protocol without IV contrast. COMPARISON:  CT abdomen 07/28/2019 FINDINGS: Lower chest: Mild pleural thickening lung bases stable. No pleural effusion Hepatobiliary: Pneumobilia within the LEFT  hepatic lobe again noted. Postcholecystectomy. Gas within the common bile duct. Pancreas: Pancreas is normal. No ductal dilatation. No pancreatic inflammation. Spleen: Normal spleen Adrenals/urinary tract: Adrenal glands normal. Mixed density renal cysts can of the fully characterize on noncontrast exam. No interval change. No obstructive uropathy. Bladder normal. Stomach/Bowel: Stomach duodenum are normal. The small bowel is normal. The terminal ileum is normal. Appendix not identified. There is submucosal edema bowel wall thickening of the ascending, transverse and sigmoid colon. The rectum appear spared. These findings are very similar to comparison CT 07/28/2019. No improvement. No oral contrast administered makes comparison difficult. No pneumatosis or portal venous gas. Venous congestion in the colonic mesentery again noted. Vascular/Lymphatic: Abdominal aorta is normal caliber with atherosclerotic calcification. There is no retroperitoneal or periportal lymphadenopathy. No pelvic lymphadenopathy. Reproductive: Prostate normal Other: No free fluid. Musculoskeletal: No aggressive osseous lesion. IMPRESSION: 1. Long segment of bowel thickening involving the ascending/ transverse and sigmoid colon. Findings are very similar to comparison CT 07/28/2019 and consistent with pancolitis. Recommend correlation for C difficile colitis or other infectious etiologies. Would also consider inflammatory bowel disease. No IV or oral contrast administered. 2. No bowel perforation.  Pneumatosis intestinalis. 3. Pneumobilia related to prior sphincterotomy again noted. Electronically Signed   By: Suzy Bouchard M.D.   On: 08/08/2019 13:53     Labs:   Basic Metabolic Panel: Recent Labs  Lab 08/08/19 1132 08/09/19 0537 08/10/19 0523 08/11/19 0522 08/12/19 0513  NA 135 141 144 146* 144  K 3.6 3.5 3.1* 2.9* 3.5  CL 111 115* 114* 114* 111  CO2 16* 13* 16* 24 22  GLUCOSE 95 69* 116* 132* 120*  BUN 57* 49* 41* 30*  24*  CREATININE 4.43* 3.72* 3.32* 2.88* 2.56*  CALCIUM 7.8* 7.3* 7.4* 7.1* 7.1*  MG  --   --   --   --  1.7   GFR Estimated Creatinine Clearance: 16.5 mL/min (A) (by C-G formula based on SCr of 2.56 mg/dL (H)). Liver Function Tests: Recent Labs  Lab 08/08/19 1132  AST 14*  ALT 14  ALKPHOS 45  BILITOT 0.4  PROT 5.7*  ALBUMIN 2.9*   No results for input(s): LIPASE, AMYLASE in the last 168 hours. No results for input(s): AMMONIA in the last 168 hours. Coagulation profile No results for input(s): INR, PROTIME in the last 168 hours.  CBC: Recent Labs  Lab 08/08/19 1617 08/09/19 0537 08/10/19 0523 08/11/19 0522 08/12/19 0513  WBC 5.4 5.9 4.4 4.5 5.8  NEUTROABS  --   --   --   --  3.7  HGB 9.4* 9.4* 9.4* 8.4* 8.0*  HCT 28.2* 29.2* 29.5* 24.6* 24.3*  MCV 92.2 95.7 95.2 90.8 92.7  PLT 265 250 292 273 271   Cardiac Enzymes: No results for input(s): CKTOTAL, CKMB, CKMBINDEX, TROPONINI in the last 168 hours. BNP: Invalid input(s): POCBNP CBG: No results  for input(s): GLUCAP in the last 168 hours. D-Dimer No results for input(s): DDIMER in the last 72 hours. Hgb A1c No results for input(s): HGBA1C in the last 72 hours. Lipid Profile No results for input(s): CHOL, HDL, LDLCALC, TRIG, CHOLHDL, LDLDIRECT in the last 72 hours. Thyroid function studies No results for input(s): TSH, T4TOTAL, T3FREE, THYROIDAB in the last 72 hours.  Invalid input(s): FREET3 Anemia work up No results for input(s): VITAMINB12, FOLATE, FERRITIN, TIBC, IRON, RETICCTPCT in the last 72 hours. Microbiology Recent Results (from the past 240 hour(s))  C Difficile Quick Screen w PCR reflex     Status: None   Collection Time: 08/08/19  1:12 PM   Specimen: Stool  Result Value Ref Range Status   C Diff antigen NEGATIVE NEGATIVE Final   C Diff toxin NEGATIVE NEGATIVE Final   C Diff interpretation No C. difficile detected.  Final    Comment: Performed at [facility], [place] [address], [place],  [address]  SARS Coronavirus 2 [facility] order, Performed in [facility] hospital lab) Nasopharyngeal Nasopharyngeal Swab     Status: None   Collection Time: 08/08/19  1:13 PM   Specimen: Nasopharyngeal Swab  Result Value Ref Range Status   SARS Coronavirus 2 NEGATIVE NEGATIVE Final    Comment: (NOTE) If result is NEGATIVE SARS-CoV-2 target nucleic acids are NOT DETECTED. The SARS-CoV-2 RNA is generally detectable in upper and lower  respiratory specimens during the acute phase of infection. The lowest  concentration of SARS-CoV-2 viral copies this assay can detect is 250  copies / mL. A negative result does not preclude SARS-CoV-2 infection  and should not be used as the sole basis for treatment or other  patient management decisions.  A negative result may occur with  improper specimen collection / handling, submission of specimen other  than nasopharyngeal swab, presence of viral mutation(s) within the  areas targeted by this assay, and inadequate number of viral copies  (<250 copies / mL). A negative result must be combined with clinical  observations, patient history, and epidemiological information. If result is POSITIVE SARS-CoV-2 target nucleic acids are DETECTED. The SARS-CoV-2 RNA is generally detectable in upper and lower  respiratory specimens dur ing the acute phase of infection.  Positive  results are indicative of active infection with SARS-CoV-2.  Clinical  correlation with patient history and other diagnostic information is  necessary to determine patient infection status.  Positive results do  not rule out bacterial infection or co-infection with other viruses. If result is PRESUMPTIVE POSTIVE SARS-CoV-2 nucleic acids MAY BE PRESENT.   A presumptive positive result was obtained on the submitted specimen  and confirmed on repeat testing.  While 2019 novel coronavirus  (SARS-CoV-2) nucleic acids may be present in the submitted sample  additional  confirmatory testing may be necessary for epidemiological  and / or clinical management purposes  to differentiate between  SARS-CoV-2 and other Sarbecovirus currently known to infect humans.  If clinically indicated additional testing with an alternate test  methodology [phone]) is advised. The SARS-CoV-2 RNA is generally  detectable in upper and lower respiratory sp ecimens during the acute  phase of infection. The expected result is Negative. Fact Sheet for Patients:  StrictlyIdeas.no Fact Sheet for Healthcare Providers: BankingDealers.co.za This test is not yet approved or cleared by the [place] FDA and has been authorized for detection and/or diagnosis of SARS-CoV-2 by FDA under an Emergency Use Authorization (EUA).  This EUA will remain in effect (meaning  this test can be used) for the duration of the COVID-19 declaration under Section 564(b)(1) of the Act, 21 U.S.C. section 360bbb-3(b)(1), unless the authorization is terminated or revoked sooner. Performed at [facility], [place] [address], [place], [place] [address]   Stool culture (children & immunocomp patients)     Status: None (Preliminary result)   Collection Time: 08/08/19  3:54 PM   Specimen: Stool  Result Value Ref Range Status   Salmonella/Shigella Screen PENDING  Incomplete   Campylobacter Culture PENDING  Incomplete   E coli, Shiga toxin Assay Negative Negative Final    Comment: (NOTE) Performed At: [facility] [place], [place] [id number] Rush Farmer MD CB:[id number]   GI pathogen panel by PCR, stool     Status: None   Collection Time: 08/08/19  4:17 PM   Specimen: Stool  Result Value Ref Range Status   Plesiomonas shigelloides NOT DETECTED NOT DETECTED Final   Yersinia enterocolitica NOT DETECTED NOT DETECTED Final   Vibrio NOT DETECTED NOT DETECTED Final   Enteropathogenic E coli NOT DETECTED NOT DETECTED  Final   E coli (ETEC) LT/ST NOT DETECTED NOT DETECTED Final   E coli 0321 by PCR Not applicable NOT DETECTED Final   Cryptosporidium by PCR NOT DETECTED NOT DETECTED Final   Entamoeba histolytica NOT DETECTED NOT DETECTED Final   Adenovirus F 40/41 NOT DETECTED NOT DETECTED Final   Norovirus GI/GII NOT DETECTED NOT DETECTED Final   Sapovirus NOT DETECTED NOT DETECTED Final    Comment: (NOTE) Performed At: [facility] [place], [place] [id number] Rush Farmer MD BC:[id number]    Vibrio cholerae NOT DETECTED NOT DETECTED Final   Campylobacter by PCR NOT DETECTED NOT DETECTED Final   Salmonella by PCR NOT DETECTED NOT DETECTED Final   E coli (STEC) NOT DETECTED NOT DETECTED Final   Enteroaggregative E coli NOT DETECTED NOT DETECTED Final   Shigella by PCR NOT DETECTED NOT DETECTED Final   Cyclospora cayetanensis NOT DETECTED NOT DETECTED Final   Astrovirus NOT DETECTED NOT DETECTED Final   G lamblia by PCR NOT DETECTED NOT DETECTED Final   Rotavirus A by PCR NOT DETECTED NOT DETECTED Final    Please note: You were cared for by a hospitalist during your hospital stay. Once you are discharged, your primary care physician will handle any further medical issues. Please note that NO REFILLS for any discharge medications will be authorized once you are discharged, as it is imperative that you return to your primary care physician (or establish a relationship with a primary care physician if you do not have one) for your post hospital discharge needs so that they can reassess your need for medications and monitor your lab values.  Signed: Marlowe Aschoff Charonda Hefter  [facility] 08/12/2019, 11:50 AM

## 2019-08-14 LAB — STOOL CULTURE REFLEX - CMPCXR

## 2019-08-14 LAB — STOOL CULTURE: E coli, Shiga toxin Assay: NEGATIVE

## 2019-08-14 LAB — STOOL CULTURE REFLEX - RSASHR

## 2019-08-16 ENCOUNTER — Ambulatory Visit: Payer: Medicare Other | Admitting: Physician Assistant

## 2019-08-31 ENCOUNTER — Emergency Department (HOSPITAL_BASED_OUTPATIENT_CLINIC_OR_DEPARTMENT_OTHER): Payer: Medicare Other

## 2019-08-31 ENCOUNTER — Other Ambulatory Visit: Payer: Self-pay

## 2019-08-31 ENCOUNTER — Emergency Department (HOSPITAL_COMMUNITY): Payer: Medicare Other

## 2019-08-31 ENCOUNTER — Encounter (HOSPITAL_COMMUNITY): Payer: Self-pay

## 2019-08-31 ENCOUNTER — Emergency Department (HOSPITAL_COMMUNITY)
Admission: EM | Admit: 2019-08-31 | Discharge: 2019-08-31 | Disposition: A | Discharge status: Home / Self Care | Payer: Medicare Other | Attending: Emergency Medicine | Admitting: Emergency Medicine

## 2019-08-31 DIAGNOSIS — I82401 Acute embolism and thrombosis of unspecified deep veins of right lower extremity: Secondary | ICD-10-CM

## 2019-08-31 DIAGNOSIS — N184 Chronic kidney disease, stage 4 (severe): Secondary | ICD-10-CM | POA: Insufficient documentation

## 2019-08-31 DIAGNOSIS — Z87891 Personal history of nicotine dependence: Secondary | ICD-10-CM | POA: Diagnosis not present

## 2019-08-31 DIAGNOSIS — Z79899 Other long term (current) drug therapy: Secondary | ICD-10-CM | POA: Insufficient documentation

## 2019-08-31 DIAGNOSIS — M25521 Pain in right elbow: Secondary | ICD-10-CM | POA: Diagnosis not present

## 2019-08-31 DIAGNOSIS — I129 Hypertensive chronic kidney disease with stage 1 through stage 4 chronic kidney disease, or unspecified chronic kidney disease: Secondary | ICD-10-CM | POA: Insufficient documentation

## 2019-08-31 DIAGNOSIS — K625 Hemorrhage of anus and rectum: Secondary | ICD-10-CM | POA: Insufficient documentation

## 2019-08-31 DIAGNOSIS — N189 Chronic kidney disease, unspecified: Secondary | ICD-10-CM

## 2019-08-31 LAB — CBC WITH DIFFERENTIAL/PLATELET
Abs Immature Granulocytes: 0.04 10*3/uL (ref 0.00–0.07)
Basophils Absolute: 0.1 10*3/uL (ref 0.0–0.1)
Basophils Relative: 1 %
Eosinophils Absolute: 0.1 10*3/uL (ref 0.0–0.5)
Eosinophils Relative: 2 %
HCT: 28.3 % — ABNORMAL LOW (ref 39.0–52.0)
Hemoglobin: 8.7 g/dL — ABNORMAL LOW (ref 13.0–17.0)
Immature Granulocytes: 1 %
Lymphocytes Relative: 32 %
Lymphs Abs: 2.4 10*3/uL (ref 0.7–4.0)
MCH: 30.1 pg (ref 26.0–34.0)
MCHC: 30.7 g/dL (ref 30.0–36.0)
MCV: 97.9 fL (ref 80.0–100.0)
Monocytes Absolute: 1.1 10*3/uL — ABNORMAL HIGH (ref 0.1–1.0)
Monocytes Relative: 15 %
Neutro Abs: 3.9 10*3/uL (ref 1.7–7.7)
Neutrophils Relative %: 49 %
Platelets: 239 10*3/uL (ref 150–400)
RBC: 2.89 MIL/uL — ABNORMAL LOW (ref 4.22–5.81)
RDW: 14.3 % (ref 11.5–15.5)
WBC: 7.6 10*3/uL (ref 4.0–10.5)
nRBC: 0 % (ref 0.0–0.2)

## 2019-08-31 LAB — BASIC METABOLIC PANEL
Anion gap: 10 (ref 5–15)
BUN: 47 mg/dL — ABNORMAL HIGH (ref 8–23)
CO2: 19 mmol/L — ABNORMAL LOW (ref 22–32)
Calcium: 8 mg/dL — ABNORMAL LOW (ref 8.9–10.3)
Chloride: 110 mmol/L (ref 98–111)
Creatinine, Ser: 3.8 mg/dL — ABNORMAL HIGH (ref 0.61–1.24)
GFR calc Af Amer: 16 mL/min — ABNORMAL LOW (ref >60)
GFR calc non Af Amer: 14 mL/min — ABNORMAL LOW (ref >60)
Glucose, Bld: 96 mg/dL (ref 70–99)
Potassium: 4 mmol/L (ref 3.5–5.1)
Sodium: 139 mmol/L (ref 135–145)

## 2019-08-31 LAB — POC OCCULT BLOOD, ED: Fecal Occult Bld: POSITIVE — AB

## 2019-08-31 LAB — PROTIME-INR
INR: 1.2 (ref 0.8–1.2)
Prothrombin Time: 15.3 seconds — ABNORMAL HIGH (ref 11.4–15.2)

## 2019-08-31 NOTE — ED Provider Notes (Signed)
Pedro Young Provider Note   CSN: [id number] Arrival date & time: 08/31/19  1027     History   Chief Complaint Chief Complaint  Patient presents with   Rectal Bleeding    HPI Pedro Young is a 85 y.o. male with PMHx HTN and renal disorder who presents to the ED today complaining of continued rectal bleeding x 1 month.    Per chart review patient was initially seen in the ED on 9/24 for left lower quadrant abdominal pain and diarrhea.  CT scan at that time showed colitis.  Patient was discharged home on Cipro and Flagyl and told to follow-up with PCP.  He returned to the ED on 10/05 with continued abdominal pain, nausea, vomiting, blood in stool.  The scan done at that time showed extensive colitis although improving from previous.  Patient also had an AKI at that point.  He was admitted to the hospital for hydration purposes and further diagnostic evaluation of his colitis.  During his hospital stay C. difficile and GI panel were negative.  Was seen by GI and had a colonoscopy which showed colitis.  He had some polyps removed.  Was in the hospital from 10/oh 5-10/09 and discharged home with 3 additional days of antibiotics.  He states that he was compliant with his antibiotics and his pain has improved although he has continued to have blood in his stool.  States he feels generally weak.  He states that he is not scheduled to see GI for another 6 weeks.  He is concerned because he continues to have bleeding and states that he has bleeding from his rectum even without having to use the restroom.   Patient saw his PCP yesterday for hospital follow-up -Per their note he had told them that his bleeding has ceased.  He already endorsed one follow-up appointment with GI and another one scheduled for 12 weeks down the road.  She is currently denying any of this.  He states that he has continued to have bleeding and has not seen GI.  Unable to see in chart review or  care everywhere if patient has had GI follow up.   Patient denies fever, chills, abdominal pain, nausea, vomiting, dizziness, lightheadedness, vision changes, syncope.       Past Medical History:  Diagnosis Date   Cancer [facility])    Hypertension    Renal disorder    CRF    Patient Active Problem List   Diagnosis Date Noted   Bloody diarrhea    Colitis    Acute gastroenteritis 08/08/2019   Gout 08/08/2015   Acute pancreatitis 08/02/2015   PRURITUS 03/05/2010   Anemia of chronic renal failure, stage 4 (severe) ([place]) 01/18/2010   Liver abscess via hepatic artery 2011, 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 01/18/2010   HEMATURIA, HX OF 01/18/2010   GASTRITIS 06/27/2009   Calculus of gallbladder and bile duct 06/21/2009   AKI (acute kidney injury) ([place]) 06/19/2009   LOSS OF WEIGHT 06/19/2009   ABDOMINAL PAIN, GENERALIZED 06/19/2009   Pure hyperglyceridemia 06/18/2009   HYPERTENSION, BENIGN ESSENTIAL 06/18/2009    Past Surgical History:  Procedure Laterality Date   AV FISTULA PLACEMENT Left 05/04/2019   Procedure: Arteriovenous (Av) Fistula Creation Left Arm;  Surgeon: Rosetta Posner, MD;  Location: [place];  Service: Vascular;  Laterality: Left;   BIOPSY  08/10/2019   Procedure: BIOPSY;  Surgeon: Jackquline Denmark, MD;  [facility];  Service: Endoscopy;;   CHOLECYSTECTOMY  COLONOSCOPY N/A 08/10/2019   Procedure: COLONOSCOPY;  Surgeon: Jackquline Denmark, MD;  [facility];  Service: Endoscopy;  Laterality: N/A;   EYE SURGERY Bilateral    cataract   POLYPECTOMY  08/10/2019   Procedure: POLYPECTOMY;  Surgeon: Jackquline Denmark, MD;  [facility];  Service: Endoscopy;;        Home Medications    Prior to Admission medications   Medication Sig Start Date End Date Taking? Authorizing Provider  amLODipine (NORVASC) 10 MG tablet Take 10 mg by mouth at bedtime.  05/15/14  Yes [provider]  furosemide (LASIX) 40 MG tablet Take 40 mg  by mouth daily as needed for fluid or edema. Prescribed by Dr Lorrene Reid [facility]) since May 2016   Yes [provider]  labetalol (NORMODYNE) 100 MG tablet Take 100 mg by mouth 2 (two) times daily.   Yes [provider]  megestrol (MEGACE) 20 MG tablet Take 20 mg by mouth daily. 08/24/19  Yes [provider]  Omega-3 Fatty Acids (FISH OIL PO) Take 1,500 mg by mouth daily.    Yes [provider]  sodium bicarbonate 650 MG tablet Take 2 tablets (1,300 mg total) by mouth 2 (two) times daily. 08/12/19 09/11/19 Yes Dahal, Marlowe Aschoff, MD    Family History Family History  Problem Relation Age of Onset   Liver disease Father    Kidney disease Father    Heart disease Father        before age 68   Heart disease Mother        before age 75    Social History Social History   Tobacco Use   Smoking status: Former Smoker    Quit date: 1956    Years since quitting: 64.8   Smokeless tobacco: Former Systems developer    Quit date: 11/03/1954  Substance Use Topics   Alcohol use: No    Alcohol/week: 0.0 standard drinks   Drug use: No     Allergies   Amoxicillin-pot clavulanate, Atorvastatin, and Lovastatin   Review of Systems Review of Systems  Constitutional: Negative for chills and fever.  HENT: Negative for congestion.   Eyes: Negative for visual disturbance.  Respiratory: Negative for cough and shortness of breath.   Cardiovascular: Negative for chest pain.  Gastrointestinal: Positive for anal bleeding and blood in stool. Negative for abdominal pain, constipation, diarrhea, nausea and vomiting.  Genitourinary: Negative for difficulty urinating.  Musculoskeletal: Negative for gait problem.  Skin: Negative for rash.  Neurological: Negative for dizziness, syncope, light-headedness and headaches.     Physical Exam Updated Vital Signs BP 127/63 (BP Location: Right Arm)    Pulse 75    Temp 98.2 F (36.8 C) (Oral)    Resp 16    Wt 54.5 kg    SpO2 100%     BMI 18.82 kg/m   Physical Exam Vitals signs and nursing note reviewed.  Constitutional:      Appearance: He is not ill-appearing.  HENT:     Head: Normocephalic and atraumatic.     Mouth/Throat:     Mouth: Mucous membranes are moist.  Eyes:     Extraocular Movements: Extraocular movements intact.     Conjunctiva/sclera: Conjunctivae normal.     Pupils: Pupils are equal, round, and reactive to light.  Neck:     Musculoskeletal: Neck supple.  Cardiovascular:     Rate and Rhythm: Normal rate and regular rhythm.     Pulses: Normal pulses.  Pulmonary:     Effort:  Pulmonary effort is normal.     Breath sounds: Normal breath sounds. No wheezing, rhonchi or rales.  Abdominal:     Palpations: Abdomen is soft.     Tenderness: There is no abdominal tenderness. There is no guarding or rebound.  Genitourinary:    Comments: Chaperone present for rectal exam Merle Tai RN. No external hemorrhoids appreciated. Good rectal tone. No obvious gross blood on glove. Guaiac positive.  Musculoskeletal:     Comments: Mild swelling noted to right elbow compared to left. Tenderness to palpation diffusely to elbow. ROM limited due to pain. Pt unable to fully extend at the elbow. ROM intact throughout wrist and shoulder; no tenderness to these areas. No erythema or increased warmth to the touch to right elbow. Strength and sensation intact throughout. 2+ radial pulse. Left AC fistula with good thrill.   Skin:    General: Skin is warm and dry.  Neurological:     Mental Status: He is alert.      ED Treatments / Results  Labs (all labs ordered are listed, but only abnormal results are displayed) Labs Reviewed  BASIC METABOLIC PANEL - Abnormal; Notable for the following components:      Result Value   CO2 19 (*)    BUN 47 (*)    Creatinine, Ser 3.80 (*)    Calcium 8.0 (*)    GFR calc non Af Amer 14 (*)    GFR calc Af Amer 16 (*)    All other components within normal limits  CBC WITH  DIFFERENTIAL/PLATELET - Abnormal; Notable for the following components:   RBC 2.89 (*)    Hemoglobin 8.7 (*)    HCT 28.3 (*)    Monocytes Absolute 1.1 (*)    All other components within normal limits  PROTIME-INR - Abnormal; Notable for the following components:   Prothrombin Time 15.3 (*)    All other components within normal limits  POC OCCULT BLOOD, ED - Abnormal; Notable for the following components:   Fecal Occult Bld POSITIVE (*)    All other components within normal limits  OCCULT BLOOD X 1 CARD TO LAB, STOOL    EKG None  Radiology Dg Elbow Complete Right  Result Date: 08/31/2019 CLINICAL DATA:  Right elbow pain for 2 weeks EXAM: RIGHT ELBOW - COMPLETE 3+ VIEW COMPARISON:  None. FINDINGS: There is no evidence of fracture or dislocation. Elevation of the anterior fat pad suggesting an underlying joint effusion although no posterior fat pad elevation is evident. Mild degenerative changes at the ulnotrochlear joint as well as subchondral cystic changes of the lateral capitellum. Soft tissues are unremarkable. IMPRESSION: Mild degenerative changes of the right elbow with suggestion of a small elbow joint effusion, which is a nonspecific finding in the absence of trauma. Electronically Signed   By: Davina Poke M.D.   On: 08/31/2019 12:35   Ue Venous Duplex (mc & Wl 7 Am - 7 Pm)  Result Date: 08/31/2019 UPPER VENOUS STUDY  Indications: Pain Comparison Study: no prior Performing Technologist: Abram Sander RVS  Examination Guidelines: A complete evaluation includes B-mode imaging, spectral Doppler, color Doppler, and power Doppler as needed of all accessible portions of each vessel. Bilateral testing is considered an integral part of a complete examination. Limited examinations for reoccurring indications may be performed as noted.  Right Findings: +----------+------------+---------+-----------+----------+-------+  RIGHT      Compressible Phasicity Spontaneous Properties Summary   +----------+------------+---------+-----------+----------+-------+  IJV  Full        Yes        Yes                         +----------+------------+---------+-----------+----------+-------+  Subclavian     Full        Yes        Yes                         +----------+------------+---------+-----------+----------+-------+  Axillary       Full        Yes        Yes                         +----------+------------+---------+-----------+----------+-------+  Brachial       Full        Yes        Yes                         +----------+------------+---------+-----------+----------+-------+  Radial         Full                                               +----------+------------+---------+-----------+----------+-------+  Ulnar          Full                                               +----------+------------+---------+-----------+----------+-------+  Cephalic       Full                                               +----------+------------+---------+-----------+----------+-------+  Basilic        Full                                               +----------+------------+---------+-----------+----------+-------+  Left Findings: +----------+------------+---------+-----------+----------+-------+  LEFT       Compressible Phasicity Spontaneous Properties Summary  +----------+------------+---------+-----------+----------+-------+  Subclavian     Full        Yes        Yes                         +----------+------------+---------+-----------+----------+-------+  Summary:  Right: No evidence of deep vein thrombosis in the upper extremity. No evidence of superficial vein thrombosis in the upper extremity.  Left: No evidence of thrombosis in the subclavian.  *See table(s) above for measurements and observations.  Diagnosing physician: Harold Barban MD Electronically signed by Harold Barban MD on 08/31/2019 at 1:12:11 PM.    Final     Procedures Procedures (including critical care time)  Medications Ordered in  ED Medications - No data to display   Initial Impression / Assessment and Plan / ED Course  I have reviewed the triage vital signs and the nursing notes.  Pertinent labs & imaging results  that were available during my care of the patient were reviewed by me and considered in my medical decision making (see chart for details).    85 year old male who returns to the ED today after being recently discharged from the hospital on 10/09 for colitis.  States he initially returns today with continued rectal bleeding although no abdominal pain.  No fevers or chills.  Vital signs are stable in the ED today.  He saw his PCP 2 days ago and had blood work done which was reassuring in terms of hemoglobin.  He has an appointment scheduled for GI within 6 weeks.  He has no abdominal tenderness on exam today.  Will obtain screening labs to ensure that hemoglobin has remained stable and check kidney function.  We will also check rectal exam for blood.   Prior to obtaining fecal occult patient's daughter at bedside.  She reports that they actually brought him in with concerns for increased weakness and pain suddenly to his right elbow that began last night.  Patient unable to fully extend at the elbow due to pain.  His elbow does appear slightly more swollen compared to the left.  Patient recently had left fistula placed to prepare for possible dialysis in the near future.  He states while he was in the hospital his IV was on the right arm.  No injury.  Will obtain x-ray of the right elbow as well as ultrasound the arm to ensure there is no blood clot.   Fecal occult positive.  Hemoglobin stable at 8.7 which is improved from hospital stay. Kidney function slightly elevated today compared to discharge at 3.80 although compared to baseline it appears patient is typically around a 3-3.5. Will have him increase his fluid intake at home. Do not feel he needs admission today for this.   Hemoglobin  Date Value Ref Range  Status  08/31/2019 8.7 (L) 13.0 - 17.0 g/dL Final  08/12/2019 8.0 (L) 13.0 - 17.0 g/dL Final  08/11/2019 8.4 (L) 13.0 - 17.0 g/dL Final  08/10/2019 9.4 (L) 13.0 - 17.0 g/dL Final   Lab Results  Component Value Date   CREATININE 3.80 (H) 08/31/2019   CREATININE 2.56 (H) 08/12/2019   CREATININE 2.88 (H) 08/11/2019   Ultrasound negative for DVT. Xray does show concern for small joint effusion. Again no trauma to the elbow. It does now appear overtly swollen on exam. No increased warmth or erythema. Very much doubt infectious etiology today or occult fracture given no posterior fat pad on xray. Advised patient to ice the elbow and to elevate at home to reduce swelling. Will have him follow up with PCP.   Attending physician Dr. Ronnald Nian has evaluated patient as well; agrees with plan. Stable for discharge at this time. Strict return precautions discussed. Pt in agreement with plan. It appears daughter left to go get lunch. Have attempted to call to update on plan without any answer. Will have nursing staff call to let her know pt ready for discharge.   This note was prepared using Dragon voice recognition software and may include unintentional dictation errors due to the inherent limitations of voice recognition software.        Final Clinical Impressions(s) / ED Diagnoses   Final diagnoses:  Rectal bleeding  Chronic kidney disease, unspecified CKD stage  Right elbow pain    ED Discharge Orders    None       Eustaquio Maize, PA-C 08/31/19 1401    Lennice Sites, DO 08/31/19 1505

## 2019-08-31 NOTE — ED Triage Notes (Signed)
Pt returns again today with blood in stool, which has been a persistent problem. Pt c/o weakness.

## 2019-08-31 NOTE — ED Provider Notes (Signed)
Medical screening examination/treatment/procedure(s) were conducted as a shared visit with non-physician practitioner(s) and myself.  I personally evaluated the patient during the encounter. Briefly, the patient is a 85 y.o. male with history of CKD, pancreatitis who presents the ED with generalized weakness.  Recently admitted for colitis.  Feels as if he still has some blood in his stool.  Overall patient has normal vitals.  No fever.  No abdominal tenderness on exam.  Overall is well-appearing.  Fecal occult is positive but stool per my PA was grossly brown.  Hemoglobin is stable at 8.7.  Likely patient just has resolving colitis.  No concern for acute GI process.  Creatinine is around baseline.  Patient has had some right elbow pain since being discharged from the hospital.  X-ray showed no acute fracture.  Had maybe a trace effusion but likely from degenerative changes that we see on x-ray.  Likely arthritic in nature overall.  No history of specific trauma.  No concern for occult fracture.  No concern for septic joint.  DVT study of the right upper arm was also negative.  Overall patient given reassurance.  Recommend ice for his elbow and follow-up with primary care doctor.  Given return precautions.  This chart was dictated using voice recognition software.  Despite best efforts to proofread,  errors can occur which can change the documentation meaning.     EKG Interpretation None          Lennice Sites, DO 08/31/19 1355

## 2019-08-31 NOTE — Progress Notes (Signed)
Upper extremity venous has been completed.   Preliminary results in CV Proc.   Abram Sander 08/31/2019 12:58 PM

## 2019-08-31 NOTE — Discharge Instructions (Addendum)
Your labwork was reassuring today. Your hemoglobin level was within normal limits despite you losing blood from your rectum. Please keep your appointment as scheduled with GI for further evaluation of your colitis.   Your kidney function was also around your typically baseline. Increase your fluid intake at home. Follow up with your kidney doctors.   Xray of your elbow showed a small amount of fluid in the elbow joint. There were no signs of a blood clot in your right arm. Please ice your elbow and elevate it at home to reduce the swelling. Follow up with your PCP regarding this.   Return to the ED for worsening symptoms - including chest pain, shortness of breath, dizziness, lightheadedness, blurry vision/double vision, feelings like you could pass out.

## 2019-09-13 ENCOUNTER — Other Ambulatory Visit (INDEPENDENT_AMBULATORY_CARE_PROVIDER_SITE_OTHER): Payer: Medicare Other

## 2019-09-13 ENCOUNTER — Other Ambulatory Visit: Payer: Self-pay

## 2019-09-13 ENCOUNTER — Encounter: Payer: Self-pay | Admitting: Gastroenterology

## 2019-09-13 ENCOUNTER — Ambulatory Visit (INDEPENDENT_AMBULATORY_CARE_PROVIDER_SITE_OTHER): Payer: Medicare Other | Admitting: Gastroenterology

## 2019-09-13 VITALS — BP 108/50 | HR 68 | Temp 98.4°F | Ht 63.0 in | Wt 116.4 lb

## 2019-09-13 DIAGNOSIS — R197 Diarrhea, unspecified: Secondary | ICD-10-CM

## 2019-09-13 LAB — COMPREHENSIVE METABOLIC PANEL
ALT: 8 U/L (ref 0–53)
AST: 12 U/L (ref 0–37)
Albumin: 2.8 g/dL — ABNORMAL LOW (ref 3.5–5.2)
Alkaline Phosphatase: 70 U/L (ref 39–117)
BUN: 48 mg/dL — ABNORMAL HIGH (ref 6–23)
CO2: 19 mEq/L (ref 19–32)
Calcium: 7.7 mg/dL — ABNORMAL LOW (ref 8.4–10.5)
Chloride: 106 mEq/L (ref 96–112)
Creatinine, Ser: 3.07 mg/dL — ABNORMAL HIGH (ref 0.40–1.50)
GFR: 19.48 mL/min — ABNORMAL LOW (ref >60.00)
Glucose, Bld: 92 mg/dL (ref 70–99)
Potassium: 3.6 mEq/L (ref 3.5–5.1)
Sodium: 133 mEq/L — ABNORMAL LOW (ref 135–145)
Total Bilirubin: 0.4 mg/dL (ref 0.2–1.2)
Total Protein: 6.3 g/dL (ref 6.0–8.3)

## 2019-09-13 LAB — CBC WITH DIFFERENTIAL/PLATELET
Basophils Absolute: 0 10*3/uL (ref 0.0–0.1)
Basophils Relative: 0.5 % (ref 0.0–3.0)
Eosinophils Absolute: 0.2 10*3/uL (ref 0.0–0.7)
Eosinophils Relative: 3 % (ref 0.0–5.0)
HCT: 24.3 % — ABNORMAL LOW (ref 39.0–52.0)
Hemoglobin: 8.3 g/dL — ABNORMAL LOW (ref 13.0–17.0)
Lymphocytes Relative: 34.5 % (ref 12.0–46.0)
Lymphs Abs: 2.2 10*3/uL (ref 0.7–4.0)
MCHC: 34.3 g/dL (ref 30.0–36.0)
MCV: 89.1 fl (ref 78.0–100.0)
Monocytes Absolute: 0.7 10*3/uL (ref 0.1–1.0)
Monocytes Relative: 11.7 % (ref 3.0–12.0)
Neutro Abs: 3.2 10*3/uL (ref 1.4–7.7)
Neutrophils Relative %: 50.3 % (ref 43.0–77.0)
Platelets: 272 10*3/uL (ref 150.0–400.0)
RBC: 2.72 Mil/uL — ABNORMAL LOW (ref 4.22–5.81)
RDW: 14.4 % (ref 11.5–15.5)
WBC: 6.3 10*3/uL (ref 4.0–10.5)

## 2019-09-13 LAB — MAGNESIUM: Magnesium: 2.1 mg/dL (ref 1.5–2.5)

## 2019-09-13 LAB — C-REACTIVE PROTEIN: CRP: 1.3 mg/dL (ref 0.5–20.0)

## 2019-09-13 NOTE — Progress Notes (Signed)
Chief Complaint:   Referring Provider:  Gregor Hams, FNP      ASSESSMENT AND PLAN;   #1.  Blood diarrhea. Adm to [facility] 10/5-10/9. CT - pancolitis. Colon 08/2019- likely resolving acute colitis. Nl TI. Bx-mildly active chronic nonspecific colitis.   #2.  Comorbid conditions include CKD, HTN, H/O prostate CA.  Plan: -  Stool studies for GI Pathogen (includes C. Diff), WBCs, culture,O&P, fecal elastase and Calprotectin. -  CBC, CMP, CRP and Mg -  Stop fish oil -  Has appt with nephrology next week. -  Can use Imodium AD bid as before. -  May need trial of budesenoide or prednisone. -  If continued diarrhea, will consider FS with Bx. -  FU in 4 weeks.  Discussed in detail with the patient and patient's family.    HPI:    Pedro Young is a 85 y.o. male  Diarrhea after eating 4-5 BMs/day.  At times mixed with the blood. He stops eating in the afternoon.  If he still eats later, he will have diarrhea again. Has been having some incontinence and using diapers Wt loss -4 pounds over the last 1 month.  Had more diarrhea after sod bicarbonate- stopped on his own.  He is also followed by nephrology.  Denies being on any antibiotics recently.  Just not doing very well  No nonsteroidals.  No exposure to Covid.  Accompanied by his family.  Wt Readings from Last 3 Encounters:  09/13/19 116 lb 6 oz (52.8 kg)  08/31/19 120 lb 2.4 oz (54.5 kg)  08/10/19 120 lb (54.4 kg)     Past Medical History:  Diagnosis Date  . Cancer ([place])   . Hypertension   . Renal disorder    CRF    Past Surgical History:  Procedure Laterality Date  . AV FISTULA PLACEMENT Left 05/04/2019   Procedure: Arteriovenous (Av) Fistula Creation Left Arm;  Surgeon: Rosetta Posner, MD;  Location: [place];  Service: Vascular;  Laterality: Left;  . BIOPSY  08/10/2019   Procedure: BIOPSY;  Surgeon: Jackquline Denmark, MD;  [facility];  Service: Endoscopy;;  . CHOLECYSTECTOMY    . COLONOSCOPY N/A  08/10/2019   Procedure: COLONOSCOPY;  Surgeon: Jackquline Denmark, MD;  [facility];  Service: Endoscopy;  Laterality: N/A;  . EYE SURGERY Bilateral    cataract  . POLYPECTOMY  08/10/2019   Procedure: POLYPECTOMY;  Surgeon: Jackquline Denmark, MD;  [facility];  Service: Endoscopy;;    Family History  Problem Relation Age of Onset  . Liver disease Father   . Kidney disease Father   . Heart disease Father        before age 53  . Heart disease Mother        before age 61    Social History   Tobacco Use  . Smoking status: Former Smoker    Quit date: 1956    Years since quitting: 64.9  . Smokeless tobacco: Former Systems developer    Quit date: 11/03/1954  Substance Use Topics  . Alcohol use: No    Alcohol/week: 0.0 standard drinks  . Drug use: No    Current Outpatient Medications  Medication Sig Dispense Refill  . amLODipine (NORVASC) 10 MG tablet Take 10 mg by mouth at bedtime.     . furosemide (LASIX) 40 MG tablet Take 40 mg by mouth daily as needed for fluid or edema. Prescribed by Dr Lorrene Reid [facility]) since May 2016    . labetalol (  NORMODYNE) 100 MG tablet Take 100 mg by mouth 2 (two) times daily.    . megestrol (MEGACE) 20 MG tablet Take 20 mg by mouth daily.    . Omega-3 Fatty Acids (FISH OIL PO) Take 1,500 mg by mouth daily.      No current facility-administered medications for this visit.     Allergies  Allergen Reactions  . Amoxicillin-Pot Clavulanate Other (See Comments)    Did it involve swelling of the face/tongue/throat, SOB, or low BP? Unknown Did it involve sudden or severe rash/hives, skin peeling, or any reaction on the inside of your mouth or nose? Unknown Did you need to seek medical attention at a hospital or doctor's office? Unknown When did it last happen?patient is unfamiliar with this allergy If all above answers are "NO", may proceed with cephalosporin use.   . Atorvastatin     REACTION: arthralgia  . Lovastatin     REACTION:  arthralgia    Review of Systems:  neg     Physical Exam:    BP (!) 108/50   Pulse 68   Temp 98.4 F (36.9 C)   Ht 5\' 3"  (1.6 m)   Wt 116 lb 6 oz (52.8 kg)   BMI 20.61 kg/m  Filed Weights   09/13/19 1119  Weight: 116 lb 6 oz (52.8 kg)   Constitutional: Cachectic male Psychiatric: Normal mood and affect. Behavior is normal. HEENT: Pupils normal.  Conjunctivae are normal. No scleral icterus. Neck supple.  Cardiovascular: Normal rate, regular rhythm. No edema Pulmonary/chest: Effort normal and breath sounds normal. No wheezing, rales or rhonchi. Abdominal: Soft, nondistended. Nontender. Bowel sounds active throughout. There are no masses palpable. No hepatomegaly. Rectal:  defered Neurological: Alert and oriented to person place and time. Skin: Skin is warm and dry. No rashes noted.  Data Reviewed: I have personally reviewed following labs and imaging studies  CBC: CBC Latest Ref Rng & Units 08/31/2019 08/12/2019 08/11/2019  WBC 4.0 - 10.5 K/uL 7.6 5.8 4.5  Hemoglobin 13.0 - 17.0 g/dL 8.7(L) 8.0(L) 8.4(L)  Hematocrit 39.0 - 52.0 % 28.3(L) 24.3(L) 24.6(L)  Platelets 150 - 400 K/uL 239 271 273    CMP: CMP Latest Ref Rng & Units 08/31/2019 08/12/2019 08/11/2019  Glucose 70 - 99 mg/dL 96 120(H) 132(H)  BUN 8 - 23 mg/dL 47(H) 24(H) 30(H)  Creatinine 0.61 - 1.24 mg/dL 3.80(H) 2.56(H) 2.88(H)  Sodium 135 - 145 mmol/L 139 144 146(H)  Potassium 3.5 - 5.1 mmol/L 4.0 3.5 2.9(L)  Chloride 98 - 111 mmol/L 110 111 114(H)  CO2 22 - 32 mmol/L 19(L) 22 24  Calcium 8.9 - 10.3 mg/dL 8.0(L) 7.1(L) 7.1(L)  Total Protein 6.5 - 8.1 g/dL - - -  Total Bilirubin 0.3 - 1.2 mg/dL - - -  Alkaline Phos 38 - 126 U/L - - -  AST 15 - 41 U/L - - -  ALT 0 - 44 U/L - - -       Carmell Austria, MD 09/13/2019, 11:35 AM  Cc: Gregor Hams, FNP

## 2019-09-13 NOTE — Patient Instructions (Signed)
If you are age 83 or older, your body mass index should be between 23-30. Your Body mass index is 20.61 kg/m. If this is out of the aforementioned range listed, please consider follow up with your Primary Care Provider.  If you are age 3 or younger, your body mass index should be between 19-25. Your Body mass index is 20.61 kg/m. If this is out of the aformentioned range listed, please consider follow up with your Primary Care Provider.   Please go to the lab at [facility] ([place] You will need to go to level "B", you do not need an appointment for this. Hours available are 7:30 am - 4:30 pm.   Stop Fish Oil  Follow up in 4 weeks.   Thank you,  Dr. Jackquline Denmark

## 2019-09-20 LAB — STOOL CULTURE
MICRO NUMBER:: 1084234
MICRO NUMBER:: 1084235
MICRO NUMBER:: 1084237
SHIGA RESULT:: NOT DETECTED
SPECIMEN QUALITY:: ADEQUATE
SPECIMEN QUALITY:: ADEQUATE
SPECIMEN QUALITY:: ADEQUATE

## 2019-09-20 LAB — OVA AND PARASITE EXAMINATION
CONCENTRATE RESULT:: NONE SEEN
MICRO NUMBER:: 1084236
SPECIMEN QUALITY:: ADEQUATE
TRICHROME RESULT:: NONE SEEN

## 2019-09-20 LAB — FECAL LACTOFERRIN, QUANT
Fecal Lactoferrin: POSITIVE — AB
MICRO NUMBER:: 1084254
SPECIMEN QUALITY:: ADEQUATE

## 2019-09-20 LAB — GASTROINTESTINAL PATHOGEN PANEL PCR
C. difficile Tox A/B, PCR: NOT DETECTED
Campylobacter, PCR: NOT DETECTED
Cryptosporidium, PCR: NOT DETECTED
E coli (ETEC) LT/ST PCR: NOT DETECTED
E coli (STEC) stx1/stx2, PCR: NOT DETECTED
E coli 0157, PCR: NOT DETECTED
Giardia lamblia, PCR: NOT DETECTED
Norovirus, PCR: NOT DETECTED
Rotavirus A, PCR: NOT DETECTED
Salmonella, PCR: NOT DETECTED
Shigella, PCR: NOT DETECTED

## 2019-09-20 LAB — CALPROTECTIN: Calprotectin: 2480 mcg/g — ABNORMAL HIGH

## 2019-09-20 LAB — PANCREATIC ELASTASE, FECAL: Pancreatic Elastase-1, Stool: >500 mcg/g

## 2019-10-17 ENCOUNTER — Encounter: Payer: Self-pay | Admitting: Gastroenterology

## 2019-10-17 ENCOUNTER — Ambulatory Visit (INDEPENDENT_AMBULATORY_CARE_PROVIDER_SITE_OTHER): Payer: Medicare Other | Admitting: Gastroenterology

## 2019-10-17 ENCOUNTER — Ambulatory Visit: Payer: Medicare Other | Admitting: Gastroenterology

## 2019-10-17 ENCOUNTER — Other Ambulatory Visit: Payer: Self-pay

## 2019-10-17 VITALS — BP 128/62 | HR 69 | Temp 98.3°F | Ht 63.0 in | Wt 119.4 lb

## 2019-10-17 DIAGNOSIS — R197 Diarrhea, unspecified: Secondary | ICD-10-CM | POA: Diagnosis not present

## 2019-10-17 NOTE — Patient Instructions (Signed)
If you are age 83 or older, your body mass index should be between 23-30. Your Body mass index is 21.15 kg/m. If this is out of the aforementioned range listed, please consider follow up with your Primary Care Provider.  If you are age 90 or younger, your body mass index should be between 19-25. Your Body mass index is 21.15 kg/m. If this is out of the aformentioned range listed, please consider follow up with your Primary Care Provider.    Follow up as needed.   Thank you,  Dr. Jackquline Denmark

## 2019-10-17 NOTE — Progress Notes (Signed)
Chief Complaint:   Referring Provider:  Gregor Hams, FNP      ASSESSMENT AND PLAN;   #1.  Blood diarrhea (resolved). Adm to [facility] 10/5-10/9. CT - pancolitis. Colon 08/2019- likely resolving acute colitis. Nl TI. Bx-mildly active chronic nonspecific colitis.   #2.  Comorbid conditions include CKD, HTN, H/O prostate CA.  Plan: - FU as needed. -Reassured patient and patient's daughter. -Do not take fish oil again if possible.    HPI:    Pedro Young is a 85 y.o. male  For follow-up visit. Feels great  No nausea, vomiting, heartburn, regurgitation, odynophagia or dysphagia.  No significant diarrhea or constipation.  No melena or hematochezia. No unintentional weight loss. No abdominal pain.  Currently has received 2 doses of IV iron.  Pleased with the progress.  Accompanied by his daughter.  Has stopped fish oil.  Wt Readings from Last 3 Encounters:  10/17/19 119 lb 6 oz (54.1 kg)  09/13/19 116 lb 6 oz (52.8 kg)  08/31/19 120 lb 2.4 oz (54.5 kg)     Past Medical History:  Diagnosis Date  . Cancer ([place])   . Hypertension   . Renal disorder    CRF    Past Surgical History:  Procedure Laterality Date  . AV FISTULA PLACEMENT Left 05/04/2019   Procedure: Arteriovenous (Av) Fistula Creation Left Arm;  Surgeon: Rosetta Posner, MD;  Location: [place];  Service: Vascular;  Laterality: Left;  . BIOPSY  08/10/2019   Procedure: BIOPSY;  Surgeon: Jackquline Denmark, MD;  [facility];  Service: Endoscopy;;  . CHOLECYSTECTOMY    . COLONOSCOPY N/A 08/10/2019   Procedure: COLONOSCOPY;  Surgeon: Jackquline Denmark, MD;  [facility];  Service: Endoscopy;  Laterality: N/A;  . EYE SURGERY Bilateral    cataract  . POLYPECTOMY  08/10/2019   Procedure: POLYPECTOMY;  Surgeon: Jackquline Denmark, MD;  [facility];  Service: Endoscopy;;    Family History  Problem Relation Age of Onset  . Liver disease Father   . Kidney disease Father   . Heart disease Father         before age 68  . Heart disease Mother        before age 6    Social History   Tobacco Use  . Smoking status: Former Smoker    Quit date: 1956    Years since quitting: 64.9  . Smokeless tobacco: Former Systems developer    Quit date: 11/03/1954  Substance Use Topics  . Alcohol use: No    Alcohol/week: 0.0 standard drinks  . Drug use: No    Current Outpatient Medications  Medication Sig Dispense Refill  . amLODipine (NORVASC) 10 MG tablet Take 10 mg by mouth at bedtime.     . furosemide (LASIX) 40 MG tablet Take 40 mg by mouth daily as needed for fluid or edema. Prescribed by Dr Lorrene Reid [facility]) since May 2016    . labetalol (NORMODYNE) 100 MG tablet Take 100 mg by mouth 2 (two) times daily.    . megestrol (MEGACE) 20 MG tablet Take 20 mg by mouth daily.    . Omega-3 Fatty Acids (FISH OIL PO) Take 1,500 mg by mouth daily.      No current facility-administered medications for this visit.    Allergies  Allergen Reactions  . Amoxicillin-Pot Clavulanate Other (See Comments)    Did it involve swelling of the face/tongue/throat, SOB, or low BP? Unknown Did it involve sudden or severe rash/hives, skin peeling,  or any reaction on the inside of your mouth or nose? Unknown Did you need to seek medical attention at a hospital or doctor's office? Unknown When did it last happen?patient is unfamiliar with this allergy If all above answers are "NO", may proceed with cephalosporin use.   . Atorvastatin     REACTION: arthralgia  . Lovastatin     REACTION: arthralgia    Review of Systems:  neg     Physical Exam:    BP 128/62   Pulse 69   Temp 98.3 F (36.8 C)   Ht 5\' 3"  (1.6 m)   Wt 119 lb 6 oz (54.1 kg)   BMI 21.15 kg/m  Filed Weights   10/17/19 0952  Weight: 119 lb 6 oz (54.1 kg)   Constitutional: Cachectic male Psychiatric: Normal mood and affect. Behavior is normal. HEENT: Pupils normal.  Conjunctivae are normal. No scleral icterus. Neck supple.    Cardiovascular: Normal rate, regular rhythm. No edema Pulmonary/chest: Effort normal and breath sounds normal. No wheezing, rales or rhonchi. Abdominal: Soft, nondistended. Nontender. Bowel sounds active throughout. There are no masses palpable. No hepatomegaly. Rectal:  defered Neurological: Alert and oriented to person place and time. Skin: Skin is warm and dry. No rashes noted.  Data Reviewed: I have personally reviewed following labs and imaging studies  CBC: CBC Latest Ref Rng & Units 09/13/2019 08/31/2019 08/12/2019  WBC 4.0 - 10.5 K/uL 6.3 7.6 5.8  Hemoglobin 13.0 - 17.0 g/dL 8.3 Repeated and verified X2.(L) 8.7(L) 8.0(L)  Hematocrit 39.0 - 52.0 % 24.3 Repeated and verified X2.(L) 28.3(L) 24.3(L)  Platelets 150.0 - 400.0 K/uL 272.0 239 271    CMP: CMP Latest Ref Rng & Units 09/13/2019 08/31/2019 08/12/2019  Glucose 70 - 99 mg/dL 92 96 120(H)  BUN 6 - 23 mg/dL 48(H) 47(H) 24(H)  Creatinine 0.40 - 1.50 mg/dL 3.07(H) 3.80(H) 2.56(H)  Sodium 135 - 145 mEq/L 133(L) 139 144  Potassium 3.5 - 5.1 mEq/L 3.6 4.0 3.5  Chloride 96 - 112 mEq/L 106 110 111  CO2 19 - 32 mEq/L 19 19(L) 22  Calcium 8.4 - 10.5 mg/dL 7.7(L) 8.0(L) 7.1(L)  Total Protein 6.0 - 8.3 g/dL 6.3 - -  Total Bilirubin 0.2 - 1.2 mg/dL 0.4 - -  Alkaline Phos 39 - 117 U/L 70 - -  AST 0 - 37 U/L 12 - -  ALT 0 - 53 U/L 8 - -       Carmell Austria, MD 10/17/2019, 10:12 AM  Cc: Gregor Hams, FNP

## 2019-10-25 ENCOUNTER — Encounter: Payer: Self-pay | Admitting: Gastroenterology

## 2020-01-19 ENCOUNTER — Ambulatory Visit: Payer: Medicare HMO | Attending: Internal Medicine

## 2020-06-06 ENCOUNTER — Telehealth: Payer: Self-pay

## 2020-06-06 NOTE — Telephone Encounter (Signed)
NOTES ON Pedro Young [phone] SENT REFERRAL TO SCHEDULING

## 2020-06-15 ENCOUNTER — Telehealth: Payer: Self-pay

## 2020-06-15 ENCOUNTER — Encounter: Payer: Self-pay | Admitting: Cardiovascular Disease

## 2020-06-15 ENCOUNTER — Ambulatory Visit (INDEPENDENT_AMBULATORY_CARE_PROVIDER_SITE_OTHER): Payer: Medicare HMO | Admitting: Cardiovascular Disease

## 2020-06-15 ENCOUNTER — Other Ambulatory Visit: Payer: Self-pay

## 2020-06-15 VITALS — BP 150/62 | HR 63 | Ht 66.0 in | Wt 119.4 lb

## 2020-06-15 DIAGNOSIS — I25118 Atherosclerotic heart disease of native coronary artery with other forms of angina pectoris: Secondary | ICD-10-CM | POA: Diagnosis not present

## 2020-06-15 DIAGNOSIS — I10 Essential (primary) hypertension: Secondary | ICD-10-CM

## 2020-06-15 DIAGNOSIS — R0602 Shortness of breath: Secondary | ICD-10-CM

## 2020-06-15 LAB — CBC
Hematocrit: 34.3 % — ABNORMAL LOW (ref 37.5–51.0)
Hemoglobin: 11.9 g/dL — ABNORMAL LOW (ref 13.0–17.7)
MCH: 31 pg (ref 26.6–33.0)
MCHC: 34.7 g/dL (ref 31.5–35.7)
MCV: 89 fL (ref 79–97)
Platelets: 132 10*3/uL — ABNORMAL LOW (ref 150–450)
RBC: 3.84 x10E6/uL — ABNORMAL LOW (ref 4.14–5.80)
RDW: 13.6 % (ref 11.6–15.4)
WBC: 6.1 10*3/uL (ref 3.4–10.8)

## 2020-06-15 LAB — BASIC METABOLIC PANEL
BUN/Creatinine Ratio: 19 (ref 10–24)
BUN: 84 mg/dL (ref 8–27)
CO2: 21 mmol/L (ref 20–29)
Calcium: 8.9 mg/dL (ref 8.6–10.2)
Chloride: 101 mmol/L (ref 96–106)
Creatinine, Ser: 4.4 mg/dL — ABNORMAL HIGH (ref 0.76–1.27)
GFR calc Af Amer: 13 mL/min/{1.73_m2} — ABNORMAL LOW (ref >59)
GFR calc non Af Amer: 11 mL/min/{1.73_m2} — ABNORMAL LOW (ref >59)
Glucose: 138 mg/dL — ABNORMAL HIGH (ref 65–99)
Potassium: 5.4 mmol/L — ABNORMAL HIGH (ref 3.5–5.2)
Sodium: 139 mmol/L (ref 134–144)

## 2020-06-15 MED ORDER — METOPROLOL TARTRATE 25 MG PO TABS: 25.0000 mg | ORAL_TABLET | Freq: Two times a day (BID) | ORAL | 3 refills | Status: DC

## 2020-06-15 MED ORDER — ROSUVASTATIN CALCIUM 40 MG PO TABS
40.0000 mg | ORAL_TABLET | Freq: Every day | ORAL | 3 refills | Status: DC
Start: 2020-06-15 — End: 2020-07-13

## 2020-06-15 MED ORDER — ASPIRIN EC 81 MG PO TBEC
81.0000 mg | DELAYED_RELEASE_TABLET | Freq: Every day | ORAL | 3 refills | Status: DC
Start: 2020-06-15 — End: 2020-07-13

## 2020-06-15 MED ORDER — NITROGLYCERIN 0.4 MG SL SUBL
0.4000 mg | SUBLINGUAL_TABLET | SUBLINGUAL | 3 refills | Status: DC | PRN
Start: 2020-06-15 — End: 2020-06-21

## 2020-06-15 NOTE — H&P (View-Only) (Signed)
Cardiology Office Note:   Date:  06/15/2020  NAME:  Pedro Young    MRN: [id number] DOB:  [birth year]   PCP:  Gregor Hams, FNP  Cardiologist:  No primary care provider on file.  Electrophysiologist:  None   Referring MD: Gregor Hams, FNP   Chief Complaint  Patient presents with  . Shortness of Breath  . Chest Pain   History of Present Illness:   Pedro Young is a 86 y.o. male with a hx of ESRD, hypertension, iron deficiency anemia who is being seen today for the evaluation of chest pain/shortness of breath at the request of Gregor Hams, FNP.  He reports for the last 6 months he gets tight in his chest when he exerts himself.  He reports heavy activity such as climbing a flight of stairs gets tightness in the left central aspect of his chest.  He reports the pain goes away when he stops exerting himself.  He reports any heavy activity is quite predictable and the characteristics of the pain.  The pain has not occurred at rest.  Has not taken any nitroglycerin.  His medical history significant for ESRD.  He has a fistula in the left arm.  He is not start dialysis yet.  I did review his recent kidney profile shows a creatinine of 3.9 and a BUN of 90.  Electrolytes are stable.  He is slightly acidotic with a bicarbonate of 18.  He also was anemic and this is due to chronic kidney disease.  He does follow [facility].  He is also noted to be thrombocytopenic recently.  We did discuss that he has stable angina needs a heart catheterization.  This will ultimately result in him requiring hemodialysis sooner than later.  We will need to coordinate a heart catheterization under the care of his nephrologist.  He reports a family history of heart disease in his mother and father.  He is a former smoker.  No illicit drug use reported.  No alcohol use reported either.  Problem List 1. CKD V/ESRD 2. HTN 3. Iron Deficiency Anemia   Past Medical History: Past Medical History:    Diagnosis Date  . Cancer ([place])   . Hypertension   . Renal disorder    CRF    Past Surgical History: Past Surgical History:  Procedure Laterality Date  . AV FISTULA PLACEMENT Left 05/04/2019   Procedure: Arteriovenous (Av) Fistula Creation Left Arm;  Surgeon: Rosetta Posner, MD;  Location: [place];  Service: Vascular;  Laterality: Left;  . BIOPSY  08/10/2019   Procedure: BIOPSY;  Surgeon: Jackquline Denmark, MD;  [facility];  Service: Endoscopy;;  . CHOLECYSTECTOMY    . COLONOSCOPY N/A 08/10/2019   Procedure: COLONOSCOPY;  Surgeon: Jackquline Denmark, MD;  [facility];  Service: Endoscopy;  Laterality: N/A;  . EYE SURGERY Bilateral    cataract  . POLYPECTOMY  08/10/2019   Procedure: POLYPECTOMY;  Surgeon: Jackquline Denmark, MD;  [facility];  Service: Endoscopy;;    Current Medications: Current Meds  Medication Sig  . furosemide (LASIX) 80 MG tablet Take 80 mg by mouth daily. Prescribed by Dr Lorrene Reid [facility]) since May 2016   . labetalol (NORMODYNE) 100 MG tablet Take 100 mg by mouth 2 (two) times daily.   . [DISCONTINUED] amLODipine (NORVASC) 10 MG tablet Take 10 mg by mouth at bedtime.   . [DISCONTINUED] megestrol (MEGACE) 20 MG tablet Take 20 mg by mouth daily.  . [DISCONTINUED] Omega-3  Fatty Acids (FISH OIL PO) Take 1,500 mg by mouth daily.      Allergies:    Amoxicillin-pot clavulanate, Atorvastatin, and Lovastatin   Social History: Social History   Socioeconomic History  . Marital status: Married    Spouse name: Not on file  . Number of children: Not on file  . Years of education: Not on file  . Highest education level: Not on file  Occupational History  . Not on file  Tobacco Use  . Smoking status: Former Smoker    Quit date: 1956    Years since quitting: 65.6  . Smokeless tobacco: Former Systems developer    Quit date: 11/03/1954  Vaping Use  . Vaping Use: Never used  Substance and Sexual Activity  . Alcohol use: No    Alcohol/week: 0.0 standard  drinks  . Drug use: No  . Sexual activity: Not on file  Other Topics Concern  . Not on file  Social History Narrative   From [place]   Moved to [place] to [place] 2006   5 daughters   Social Determinants of Radio broadcast assistant Strain:   . Difficulty of Paying Living Expenses:   Food Insecurity:   . Worried About Charity fundraiser in the Last Year:   . Arboriculturist in the Last Year:   Transportation Needs:   . Film/video editor (Medical):   Marland Kitchen Lack of Transportation (Non-Medical):   Physical Activity:   . Days of Exercise per Week:   . Minutes of Exercise per Session:   Stress:   . Feeling of Stress :   Social Connections:   . Frequency of Communication with Friends and Family:   . Frequency of Social Gatherings with Friends and Family:   . Attends Religious Services:   . Active Member of Clubs or Organizations:   . Attends Archivist Meetings:   Marland Kitchen Marital Status:      Family History: The patient's family history includes Heart disease in his father and mother; Kidney disease in his father; Liver disease in his father.  ROS:   All other ROS reviewed and negative. Pertinent positives noted in the HPI.     EKGs/Labs/Other Studies Reviewed:   The following studies were personally reviewed by me today:  EKG:  EKG is ordered today.  The ekg ordered today demonstrates normal sinus rhythm, heart 63, no acute ST-T changes, no evidence for infarction, and was personally reviewed by me.   Recent Labs: 09/13/2019: ALT 8; BUN 48; Creatinine, Ser 3.07; Hemoglobin 8.3 Repeated and verified X2.; Magnesium 2.1; Platelets 272.0; Potassium 3.6; Sodium 133   Recent Lipid Panel    Component Value Date/Time   CHOL 175 08/02/2015 1705   TRIG 128 08/02/2015 1705   HDL 33 (L) 08/02/2015 1705   CHOLHDL 5.3 08/02/2015 1705   VLDL 26 08/02/2015 1705   LDLCALC 116 (H) 08/02/2015 1705    Physical Exam:   VS:  BP (!) 150/62 (BP Location: Right Arm,  Patient Position: Sitting, Cuff Size: Normal)   Pulse 63   Ht 5\' 6"  (1.676 m)   Wt 119 lb 6.4 oz (54.2 kg)   BMI 19.27 kg/m    Wt Readings from Last 3 Encounters:  06/15/20 119 lb 6.4 oz (54.2 kg)  10/17/19 119 lb 6 oz (54.1 kg)  09/13/19 116 lb 6 oz (52.8 kg)    General: Well nourished, well developed, in no acute distress Heart: Atraumatic,  normal size  Eyes: PEERLA, EOMI  Neck: Supple, no JVD Endocrine: No thryomegaly Cardiac: Normal S1, S2; RRR; no murmurs, rubs, or gallops Lungs: Clear to auscultation bilaterally, no wheezing, rhonchi or rales  Abd: Soft, nontender, no hepatomegaly  Ext: No edema, pulses 2+, left upper extremity AV fistula with good thrill and bruit Musculoskeletal: No deformities, BUE and BLE strength normal and equal Skin: Warm and dry, no rashes   Neuro: Alert and oriented to person, place, time, and situation, CNII-XII grossly intact, no focal deficits  Psych: Normal mood and affect   ASSESSMENT:   Pedro Young is a 86 y.o. male who presents for the following: 1. Coronary artery disease of native artery of native heart with stable angina pectoris ([place])   2. SOB (shortness of breath) on exertion   3. Essential hypertension    PLAN:   1. Coronary artery disease of native artery of native heart with stable angina pectoris ([place]) -He presents with classic symptoms of stable angina.  He gets classic left-sided chest pressure with heavy exertion.  It resolves with rest.  He has had no prolonged episodes.  His EKG today demonstrates normal sinus rhythm.  He is ESRD but does have a left AV fistula in place.  His symptoms are to the point he cannot do any activities such as climbing a flight of stairs.  I do consider this progressive angina and symptom limiting.  A stress test is of no value here.  He is very high risk for CAD given his ESRD and hypertension.  I recommended cardiac catheterization.   -I did discuss with the nephrologist on-call doctor.  He is  nearing dialysis anyway.  Most recent serum creatinine 3.4.  BUN 90, bicarbonate 18.  Given that he does have CAD and he will need dialysis we do need to determine what his underlying coronary anatomy is.  I recommended cardiac catheterization.  We will do this next week.  We will admit him to short stay several hours before for aggressive hydration before cardiac catheterization.  He will likely be admitted afterwards.  We will then carefully watch his kidney function as he may need hemodialysis.  Hopefully we get him through this without initiation while in the hospital. -He was also noted to have some thrombocytopenia and chronic anemia.  This is related to his CKD.  His most recent hemoglobin is 11.2.  Platelets 89,000.  We will recheck a CBC today.  He also will recheck his kidney function today as well. -I recommend he start aspirin 81 mg daily.  He will start Crestor 40 mg daily as well.  We will also start metoprolol tartrate 25 mg twice daily.  I would also like for him to have sublingual nitros if he does have pain that is prolonged he should take these.  He was given strict return precautions to the emergency room if he has prolonged pain for more than 15 minutes with 3 to 5 minutes between each dose of sublingual nitroglycerin.  If he reaches more than 3 doses of nitroglycerin he will go to the emergency room.  He was in agreement with this plan. -He will need an echocardiogram but this will be obtained after his cardiac cath.  If he gets admitted we will obtain this while he is in-house.  2. SOB (shortness of breath) on exertion -Symptoms are likely related to underlying obstructive CAD.  See description of stable CAD above.  Proceed with cardiac cath as above.  3. Essential hypertension -  BP a bit elevated.  Add metoprolol as above.  Stop labetalol.  Disposition: Return in about 2 weeks (around 06/29/2020).  Medication Adjustments/Labs and Tests Ordered: Current medicines are reviewed at  length with the patient today.  Concerns regarding medicines are outlined above.  Orders Placed This Encounter  Procedures  . Basic metabolic panel  . CBC  . EKG 12-Lead   Meds ordered this encounter  Medications  . metoprolol tartrate (LOPRESSOR) 25 MG tablet    Sig: Take 1 tablet (25 mg total) by mouth 2 (two) times daily.    Dispense:  180 tablet    Refill:  3  . rosuvastatin (CRESTOR) 40 MG tablet    Sig: Take 1 tablet (40 mg total) by mouth daily.    Dispense:  90 tablet    Refill:  3  . aspirin EC 81 MG tablet    Sig: Take 1 tablet (81 mg total) by mouth daily. Swallow whole.    Dispense:  90 tablet    Refill:  3  . nitroGLYCERIN (NITROSTAT) 0.4 MG SL tablet    Sig: Place 1 tablet (0.4 mg total) under the tongue every 5 (five) minutes as needed for chest pain.    Dispense:  90 tablet    Refill:  3    Patient Instructions  Medication Instructions:  Start Aspirin 81 mg  Take Nitroglycerin as needed for chest pain Start Crestor 40 mg daily  Start Metoprolol Tartrate 25 mg twice daily   *If you need a refill on your cardiac medications before your next appointment, please call your pharmacy*   Lab Work: CBC, BMET today  COVID TEST: Saturday (tomorrow) 12:00 lunch time- [address], [place], [place]  If you have labs (blood work) drawn today and your tests are completely normal, you will receive your results only by: Marland Kitchen MyChart Message (if you have MyChart) OR . A paper copy in the mail If you have any lab test that is abnormal or we need to change your treatment, we will call you to review the results.   Testing/Procedures:  Your physician has requested that you have a cardiac catheterization. Cardiac catheterization is used to diagnose and/or treat various heart conditions. Doctors may recommend this procedure for a number of different reasons. The most common reason is to evaluate chest pain. Chest pain can be a symptom of coronary artery disease (CAD), and  cardiac catheterization can show whether plaque is narrowing or blocking your heart's arteries. This procedure is also used to evaluate the valves, as well as measure the blood flow and oxygen levels in different parts of your heart. For further information please visit [email or web address]. Please follow instruction sheet, as given.    Follow-Up: At [facility], you and your health needs are our priority.  As part of our continuing mission to provide you with exceptional heart care, we have created designated Provider Care Teams.  These Care Teams include your primary Cardiologist (physician) and Advanced Practice Providers (APPs -  Physician Assistants and Nurse Practitioners) who all work together to provide you with the care you need, when you need it.  We recommend signing up for the patient portal called "MyChart".  Sign up information is provided on this After Visit Summary.  MyChart is used to connect with patients for Virtual Visits (Telemedicine).  Patients are able to view lab/test results, encounter notes, upcoming appointments, etc.  Non-urgent messages can be sent to your provider as well.   To learn more about what  you can do with MyChart, go to [email or web address].    Your next appointment:   2 week(s) post CATH   The format for your next appointment:   In Person  Provider:   Eleonore Chiquito, MD   Other Instructions     [address] [place] [address] Dept: [phone] Loc: [place]  06/15/2020  You are scheduled for a Cardiac Catheterization on Tuesday, August 17 with Dr. Daneen Schick.  1. Please arrive at the [facility] (Main Entrance A) at [facility]: [address] [place], [place] [address] at 7:00 AM (for hydration) (This time is two hours before your procedure to ensure your preparation). Free valet parking service is  available.   Special note: Every effort is made to have your procedure done on time. Please understand that emergencies sometimes delay scheduled procedures.  2. Diet: Do not eat solid foods after midnight.  The patient may have clear liquids until 5am upon the day of the procedure.  3. Labs: You will need to have blood drawn on You do not need to be fasting.  4. Medication instructions in preparation for your procedure:   Contrast Allergy: No   Stop taking, Lasix (Furosemide)  Tuesday, August 17,  On the morning of your procedure, take your Aspirin and any morning medicines NOT listed above.  You may use sips of water.  5. Plan for one night stay--bring personal belongings. 6. Bring a current list of your medications and current insurance cards. 7. You MUST have a responsible person to drive you home. 8. Someone MUST be with you the first 24 hours after you arrive home or your discharge will be delayed. 9. Please wear clothes that are easy to get on and off and wear slip-on shoes.  Thank you for allowing [place] to care for you!   -- [facility]       Signed, Addison Naegeli. Audie Box, [place]  [address] [place], [place] [address] [phone]  06/15/2020 12:40 PM

## 2020-06-15 NOTE — Telephone Encounter (Signed)
Called patient to advise to stop Labetalol medication, spoke with daughter as patient had stepped out- she is emergency contact.  Started new medication Metoprolol and we did not want him taking both.  Advised to have patient call back if questions or concerns.

## 2020-06-15 NOTE — Patient Instructions (Addendum)
Medication Instructions:  Start Aspirin 81 mg  Take Nitroglycerin as needed for chest pain Start Crestor 40 mg daily  Start Metoprolol Tartrate 25 mg twice daily   *If you need a refill on your cardiac medications before your next appointment, please call your pharmacy*   Lab Work: CBC, BMET today  COVID TEST: Saturday (tomorrow) 12:00 lunch time- [address], [place], [place]  If you have labs (blood work) drawn today and your tests are completely normal, you will receive your results only by:  [place] (if you have MyChart) OR  A paper copy in the mail If you have any lab test that is abnormal or we need to change your treatment, we will call you to review the results.   Testing/Procedures:  Your physician has requested that you have a cardiac catheterization. Cardiac catheterization is used to diagnose and/or treat various heart conditions. Doctors may recommend this procedure for a number of different reasons. The most common reason is to evaluate chest pain. Chest pain can be a symptom of coronary artery disease (CAD), and cardiac catheterization can show whether plaque is narrowing or blocking your hearts arteries. This procedure is also used to evaluate the valves, as well as measure the blood flow and oxygen levels in different parts of your heart. For further information please visit HugeFiesta.[email or web address]. Please follow instruction sheet, as given.    Follow-Up: At [facility], you and your health needs are our priority.  As part of our continuing mission to provide you with exceptional heart care, we have created designated Provider Care Teams.  These Care Teams include your primary Cardiologist (physician) and Advanced Practice Providers (APPs -  Physician Assistants and Nurse Practitioners) who all work together to provide you with the care you need, when you need it.  We recommend signing up for the patient portal called "MyChart".  Sign up information is  provided on this After Visit Summary.  MyChart is used to connect with patients for Virtual Visits (Telemedicine).  Patients are able to view lab/test results, encounter notes, upcoming appointments, etc.  Non-urgent messages can be sent to your provider as well.   To learn more about what you can do with MyChart, go to [email or web address].    Your next appointment:   2 week(s) post CATH   The format for your next appointment:   In Person  Provider:   Eleonore Chiquito, MD   Other Instructions     [address] [address] [place] [address] Dept: [phone] Loc: [place]  06/15/2020  You are scheduled for a Cardiac Catheterization on Tuesday, August 17 with Dr. Daneen Schick.  1. Please arrive at the [facility] (Main Entrance A) at [facility]: [address] [place], [place] [address] at 7:00 AM (for hydration) (This time is two hours before your procedure to ensure your preparation). Free valet parking service is available.   Special note: Every effort is made to have your procedure done on time. Please understand that emergencies sometimes delay scheduled procedures.  2. Diet: Do not eat solid foods after midnight.  The patient may have clear liquids until 5am upon the day of the procedure.  3. Labs: You will need to have blood drawn on You do not need to be fasting.  4. Medication instructions in preparation for your procedure:   Contrast Allergy: No   Stop taking, Lasix (Furosemide)  Tuesday, August 17,  On the morning of your procedure, take your Aspirin and any morning medicines NOT listed above.  You may use sips of water.  5. Plan for one night stay--bring personal belongings. 6. Bring a current list of your medications and current insurance cards. 7. You MUST have a responsible person to drive you home. 8. Someone MUST be with you the first  24 hours after you arrive home or your discharge will be delayed. 9. Please wear clothes that are easy to get on and off and wear slip-on shoes.  Thank you for allowing [place] to care for you!   -- [place] [facility]

## 2020-06-15 NOTE — Addendum Note (Signed)
Addended by: Caprice Beaver T on: 06/15/2020 01:40 PM   Modules accepted: Orders

## 2020-06-15 NOTE — Progress Notes (Signed)
Cardiology Office Note:   Date:  06/15/2020  NAME:  Pedro Young    MRN: [id number] DOB:  11/16/1932   PCP:  Gregor Hams, FNP  Cardiologist:  No primary care provider on file.  Electrophysiologist:  None   Referring MD: Gregor Hams, FNP   Chief Complaint  Patient presents with  . Shortness of Breath  . Chest Pain   History of Present Illness:   Pedro Young is a 86 y.o. male with a hx of ESRD, hypertension, iron deficiency anemia who is being seen today for the evaluation of chest pain/shortness of breath at the request of Gregor Hams, FNP.  He reports for the last 6 months he gets tight in his chest when he exerts himself.  He reports heavy activity such as climbing a flight of stairs gets tightness in the left central aspect of his chest.  He reports the pain goes away when he stops exerting himself.  He reports any heavy activity is quite predictable and the characteristics of the pain.  The pain has not occurred at rest.  Has not taken any nitroglycerin.  His medical history significant for ESRD.  He has a fistula in the left arm.  He is not start dialysis yet.  I did review his recent kidney profile shows a creatinine of 3.9 and a BUN of 90.  Electrolytes are stable.  He is slightly acidotic with a bicarbonate of 18.  He also was anemic and this is due to chronic kidney disease.  He does follow [facility].  He is also noted to be thrombocytopenic recently.  We did discuss that he has stable angina needs a heart catheterization.  This will ultimately result in him requiring hemodialysis sooner than later.  We will need to coordinate a heart catheterization under the care of his nephrologist.  He reports a family history of heart disease in his mother and father.  He is a former smoker.  No illicit drug use reported.  No alcohol use reported either.  Problem List 1. CKD V/ESRD 2. HTN 3. Iron Deficiency Anemia   Past Medical History: Past Medical History:    Diagnosis Date  . Cancer ([place])   . Hypertension   . Renal disorder    CRF    Past Surgical History: Past Surgical History:  Procedure Laterality Date  . AV FISTULA PLACEMENT Left 05/04/2019   Procedure: Arteriovenous (Av) Fistula Creation Left Arm;  Surgeon: Rosetta Posner, MD;  Location: [facility];  Service: Vascular;  Laterality: Left;  . BIOPSY  08/10/2019   Procedure: BIOPSY;  Surgeon: Jackquline Denmark, MD;  Location: [facility];  Service: Endoscopy;;  . CHOLECYSTECTOMY    . COLONOSCOPY N/A 08/10/2019   Procedure: COLONOSCOPY;  Surgeon: Jackquline Denmark, MD;  Location: [facility];  Service: Endoscopy;  Laterality: N/A;  . EYE SURGERY Bilateral    cataract  . POLYPECTOMY  08/10/2019   Procedure: POLYPECTOMY;  Surgeon: Jackquline Denmark, MD;  Location: [facility];  Service: Endoscopy;;    Current Medications: Current Meds  Medication Sig  . furosemide (LASIX) 80 MG tablet Take 80 mg by mouth daily. Prescribed by Dr Lorrene Reid [facility]) since May 2016   . labetalol (NORMODYNE) 100 MG tablet Take 100 mg by mouth 2 (two) times daily.   . [DISCONTINUED] amLODipine (NORVASC) 10 MG tablet Take 10 mg by mouth at bedtime.   . [DISCONTINUED] megestrol (MEGACE) 20 MG tablet Take 20 mg by mouth daily.  . [DISCONTINUED] Omega-3  Fatty Acids (FISH OIL PO) Take 1,500 mg by mouth daily.      Allergies:    Amoxicillin-pot clavulanate, Atorvastatin, and Lovastatin   Social History: Social History   Socioeconomic History  . Marital status: Married    Spouse name: Not on file  . Number of children: Not on file  . Years of education: Not on file  . Highest education level: Not on file  Occupational History  . Not on file  Tobacco Use  . Smoking status: Former Smoker    Quit date: 1956    Years since quitting: 65.6  . Smokeless tobacco: Former Systems developer    Quit date: 11/03/1954  Vaping Use  . Vaping Use: Never used  Substance and Sexual Activity  . Alcohol use: No    Alcohol/week: 0.0 standard  drinks  . Drug use: No  . Sexual activity: Not on file  Other Topics Concern  . Not on file  Social History Narrative   From [place]   Moved to [place] to [place] 2006   5 daughters   Social Determinants of Radio broadcast assistant Strain:   . Difficulty of Paying Living Expenses:   Food Insecurity:   . Worried About Charity fundraiser in the Last Year:   . Arboriculturist in the Last Year:   Transportation Needs:   . Film/video editor (Medical):   Marland Kitchen Lack of Transportation (Non-Medical):   Physical Activity:   . Days of Exercise per Week:   . Minutes of Exercise per Session:   Stress:   . Feeling of Stress :   Social Connections:   . Frequency of Communication with Friends and Family:   . Frequency of Social Gatherings with Friends and Family:   . Attends Religious Services:   . Active Member of Clubs or Organizations:   . Attends Archivist Meetings:   Marland Kitchen Marital Status:      Family History: The patient's family history includes Heart disease in his father and mother; Kidney disease in his father; Liver disease in his father.  ROS:   All other ROS reviewed and negative. Pertinent positives noted in the HPI.     EKGs/Labs/Other Studies Reviewed:   The following studies were personally reviewed by me today:  EKG:  EKG is ordered today.  The ekg ordered today demonstrates normal sinus rhythm, heart 63, no acute ST-T changes, no evidence for infarction, and was personally reviewed by me.   Recent Labs: 09/13/2019: ALT 8; BUN 48; Creatinine, Ser 3.07; Hemoglobin 8.3 Repeated and verified X2.; Magnesium 2.1; Platelets 272.0; Potassium 3.6; Sodium 133   Recent Lipid Panel    Component Value Date/Time   CHOL 175 08/02/2015 1705   TRIG 128 08/02/2015 1705   HDL 33 (L) 08/02/2015 1705   CHOLHDL 5.3 08/02/2015 1705   VLDL 26 08/02/2015 1705   LDLCALC 116 (H) 08/02/2015 1705    Physical Exam:   VS:  BP (!) 150/62 (BP Location: Right Arm,  Patient Position: Sitting, Cuff Size: Normal)   Pulse 63   Ht 5\' 6"  (1.676 m)   Wt 119 lb 6.4 oz (54.2 kg)   BMI 19.27 kg/m    Wt Readings from Last 3 Encounters:  06/15/20 119 lb 6.4 oz (54.2 kg)  10/17/19 119 lb 6 oz (54.1 kg)  09/13/19 116 lb 6 oz (52.8 kg)    General: Well nourished, well developed, in no acute distress Heart: Atraumatic,  normal size  Eyes: PEERLA, EOMI  Neck: Supple, no JVD Endocrine: No thryomegaly Cardiac: Normal S1, S2; RRR; no murmurs, rubs, or gallops Lungs: Clear to auscultation bilaterally, no wheezing, rhonchi or rales  Abd: Soft, nontender, no hepatomegaly  Ext: No edema, pulses 2+, left upper extremity AV fistula with good thrill and bruit Musculoskeletal: No deformities, BUE and BLE strength normal and equal Skin: Warm and dry, no rashes   Neuro: Alert and oriented to person, place, time, and situation, CNII-XII grossly intact, no focal deficits  Psych: Normal mood and affect   ASSESSMENT:   Pedro Young is a 86 y.o. male who presents for the following: 1. Coronary artery disease of native artery of native heart with stable angina pectoris ([place])   2. SOB (shortness of breath) on exertion   3. Essential hypertension    PLAN:   1. Coronary artery disease of native artery of native heart with stable angina pectoris ([place]) -He presents with classic symptoms of stable angina.  He gets classic left-sided chest pressure with heavy exertion.  It resolves with rest.  He has had no prolonged episodes.  His EKG today demonstrates normal sinus rhythm.  He is ESRD but does have a left AV fistula in place.  His symptoms are to the point he cannot do any activities such as climbing a flight of stairs.  I do consider this progressive angina and symptom limiting.  A stress test is of no value here.  He is very high risk for CAD given his ESRD and hypertension.  I recommended cardiac catheterization.   -I did discuss with the nephrologist on-call doctor.  He is  nearing dialysis anyway.  Most recent serum creatinine 3.4.  BUN 90, bicarbonate 18.  Given that he does have CAD and he will need dialysis we do need to determine what his underlying coronary anatomy is.  I recommended cardiac catheterization.  We will do this next week.  We will admit him to short stay several hours before for aggressive hydration before cardiac catheterization.  He will likely be admitted afterwards.  We will then carefully watch his kidney function as he may need hemodialysis.  Hopefully we get him through this without initiation while in the hospital. -He was also noted to have some thrombocytopenia and chronic anemia.  This is related to his CKD.  His most recent hemoglobin is 11.2.  Platelets 89,000.  We will recheck a CBC today.  He also will recheck his kidney function today as well. -I recommend he start aspirin 81 mg daily.  He will start Crestor 40 mg daily as well.  We will also start metoprolol tartrate 25 mg twice daily.  I would also like for him to have sublingual nitros if he does have pain that is prolonged he should take these.  He was given strict return precautions to the emergency room if he has prolonged pain for more than 15 minutes with 3 to 5 minutes between each dose of sublingual nitroglycerin.  If he reaches more than 3 doses of nitroglycerin he will go to the emergency room.  He was in agreement with this plan. -He will need an echocardiogram but this will be obtained after his cardiac cath.  If he gets admitted we will obtain this while he is in-house.  2. SOB (shortness of breath) on exertion -Symptoms are likely related to underlying obstructive CAD.  See description of stable CAD above.  Proceed with cardiac cath as above.  3. Essential hypertension -  BP a bit elevated.  Add metoprolol as above.  Stop labetalol.  Disposition: Return in about 2 weeks (around 06/29/2020).  Medication Adjustments/Labs and Tests Ordered: Current medicines are reviewed at  length with the patient today.  Concerns regarding medicines are outlined above.  Orders Placed This Encounter  Procedures  . Basic metabolic panel  . CBC  . EKG 12-Lead   Meds ordered this encounter  Medications  . metoprolol tartrate (LOPRESSOR) 25 MG tablet    Sig: Take 1 tablet (25 mg total) by mouth 2 (two) times daily.    Dispense:  180 tablet    Refill:  3  . rosuvastatin (CRESTOR) 40 MG tablet    Sig: Take 1 tablet (40 mg total) by mouth daily.    Dispense:  90 tablet    Refill:  3  . aspirin EC 81 MG tablet    Sig: Take 1 tablet (81 mg total) by mouth daily. Swallow whole.    Dispense:  90 tablet    Refill:  3  . nitroGLYCERIN (NITROSTAT) 0.4 MG SL tablet    Sig: Place 1 tablet (0.4 mg total) under the tongue every 5 (five) minutes as needed for chest pain.    Dispense:  90 tablet    Refill:  3    Patient Instructions  Medication Instructions:  Start Aspirin 81 mg  Take Nitroglycerin as needed for chest pain Start Crestor 40 mg daily  Start Metoprolol Tartrate 25 mg twice daily   *If you need a refill on your cardiac medications before your next appointment, please call your pharmacy*   Lab Work: CBC, BMET today  COVID TEST: Saturday (tomorrow) 12:00 lunch time- [address], [place], [place]  If you have labs (blood work) drawn today and your tests are completely normal, you will receive your results only by: Marland Kitchen MyChart Message (if you have MyChart) OR . A paper copy in the mail If you have any lab test that is abnormal or we need to change your treatment, we will call you to review the results.   Testing/Procedures:  Your physician has requested that you have a cardiac catheterization. Cardiac catheterization is used to diagnose and/or treat various heart conditions. Doctors may recommend this procedure for a number of different reasons. The most common reason is to evaluate chest pain. Chest pain can be a symptom of coronary artery disease (CAD), and  cardiac catheterization can show whether plaque is narrowing or blocking your heart's arteries. This procedure is also used to evaluate the valves, as well as measure the blood flow and oxygen levels in different parts of your heart. For further information please visit HugeFiesta.[email or web address]. Please follow instruction sheet, as given.    Follow-Up: At [facility], you and your health needs are our priority.  As part of our continuing mission to provide you with exceptional heart care, we have created designated Provider Care Teams.  These Care Teams include your primary Cardiologist (physician) and Advanced Practice Providers (APPs -  Physician Assistants and Nurse Practitioners) who all work together to provide you with the care you need, when you need it.  We recommend signing up for the patient portal called "MyChart".  Sign up information is provided on this After Visit Summary.  MyChart is used to connect with patients for Virtual Visits (Telemedicine).  Patients are able to view lab/test results, encounter notes, upcoming appointments, etc.  Non-urgent messages can be sent to your provider as well.   To learn more about what  you can do with MyChart, go to [email or web address].    Your next appointment:   2 week(s) post CATH   The format for your next appointment:   In Person  Provider:   Eleonore Chiquito, MD   Other Instructions     [address] [address] Dept: [phone] Loc: [place]  06/15/2020  You are scheduled for a Cardiac Catheterization on Tuesday, August 17 with Dr. Daneen Schick.  1. Please arrive at the [facility] (Main Entrance A) at [facility]: [address] [place], [place] [address] at 7:00 AM (for hydration) (This time is two hours before your procedure to ensure your preparation). Free valet parking service is  available.   Special note: Every effort is made to have your procedure done on time. Please understand that emergencies sometimes delay scheduled procedures.  2. Diet: Do not eat solid foods after midnight.  The patient may have clear liquids until 5am upon the day of the procedure.  3. Labs: You will need to have blood drawn on You do not need to be fasting.  4. Medication instructions in preparation for your procedure:   Contrast Allergy: No   Stop taking, Lasix (Furosemide)  Tuesday, August 17,  On the morning of your procedure, take your Aspirin and any morning medicines NOT listed above.  You may use sips of water.  5. Plan for one night stay--bring personal belongings. 6. Bring a current list of your medications and current insurance cards. 7. You MUST have a responsible person to drive you home. 8. Someone MUST be with you the first 24 hours after you arrive home or your discharge will be delayed. 9. Please wear clothes that are easy to get on and off and wear slip-on shoes.  Thank you for allowing [place] to care for you!   -- [place] [facility]       Signed, Addison Naegeli. Audie Box, [place]  [address], [place],  [address] [phone]  06/15/2020 12:40 PM

## 2020-06-16 ENCOUNTER — Other Ambulatory Visit (HOSPITAL_COMMUNITY)
Admission: RE | Admit: 2020-06-16 | Discharge: 2020-06-16 | Disposition: A | Discharge status: Home / Self Care | Payer: Medicare HMO | Source: Ambulatory Visit | Attending: Interventional Cardiology | Admitting: Interventional Cardiology

## 2020-06-16 DIAGNOSIS — Z01812 Encounter for preprocedural laboratory examination: Secondary | ICD-10-CM | POA: Insufficient documentation

## 2020-06-16 DIAGNOSIS — Z20822 Contact with and (suspected) exposure to covid-19: Secondary | ICD-10-CM | POA: Diagnosis not present

## 2020-06-16 LAB — SARS CORONAVIRUS 2 (TAT 6-24 HRS): SARS Coronavirus 2: NEGATIVE

## 2020-06-17 NOTE — H&P (Signed)
Progressive angina and DOE CKD 5, anemia, and thrombocytopenia Define anatomy, needs echocardiogram to assess LVF, admit post cath, monitor Kidney function. High risk for vascular, hemorrhagic, and renal complications from cath.

## 2020-06-18 ENCOUNTER — Telehealth: Payer: Self-pay | Admitting: *Deleted

## 2020-06-18 NOTE — Telephone Encounter (Signed)
Pt contacted pre-catheterization scheduled at [facility] for: Tuesday June 19, 2020 12 Noon Verified arrival time and place: [place] [facility]) at: 7 AM-pre-procedure hydration-discussed with patient   No solid food after midnight prior to cath, clear liquids until 5 AM day of procedure.  Hold: Lasix-AM of procedure-GFR 11  Except hold medications AM meds can be  taken pre-cath with sips of water including: ASA 81 mg   Confirmed patient has responsible adult to drive home post procedure and observe 24 hours after arriving home: yes  You are allowed ONE visitor in the waiting room during your procedure. Both you and your visitor must wear a mask once you enter the hospital.       COVID-19 Pre-Screening Questions:  . In the past 7 to 10 days have you had a new cough, shortness of breath, headache, congestion, fever (100 or greater) unexplained body aches, new sore throat, or sudden loss of taste or sense of smell? no . In the past 7 to 10 days have you been around anyone with known Covid 19? no . Have you been vaccinated for COVID-19? Yes, see immunization history  Reviewed procedure/mask/visitor instructions, COVID-19 questions with patient.

## 2020-06-19 ENCOUNTER — Other Ambulatory Visit: Payer: Self-pay

## 2020-06-19 ENCOUNTER — Encounter (HOSPITAL_COMMUNITY)
Admission: RE | Disposition: A | Discharge status: Home / Self Care | Payer: Self-pay | Source: Home / Self Care | Attending: Interventional Cardiology

## 2020-06-19 ENCOUNTER — Ambulatory Visit (HOSPITAL_COMMUNITY)
Admission: RE | Admit: 2020-06-19 | Discharge: 2020-06-19 | Disposition: A | Discharge status: Home / Self Care | Payer: Medicare HMO | Attending: Interventional Cardiology | Admitting: Interventional Cardiology

## 2020-06-19 DIAGNOSIS — Z79899 Other long term (current) drug therapy: Secondary | ICD-10-CM | POA: Insufficient documentation

## 2020-06-19 DIAGNOSIS — R0602 Shortness of breath: Secondary | ICD-10-CM | POA: Diagnosis not present

## 2020-06-19 DIAGNOSIS — I25118 Atherosclerotic heart disease of native coronary artery with other forms of angina pectoris: Secondary | ICD-10-CM

## 2020-06-19 DIAGNOSIS — I10 Essential (primary) hypertension: Secondary | ICD-10-CM | POA: Diagnosis present

## 2020-06-19 DIAGNOSIS — N184 Chronic kidney disease, stage 4 (severe): Secondary | ICD-10-CM | POA: Diagnosis present

## 2020-06-19 DIAGNOSIS — N186 End stage renal disease: Secondary | ICD-10-CM | POA: Diagnosis not present

## 2020-06-19 DIAGNOSIS — I120 Hypertensive chronic kidney disease with stage 5 chronic kidney disease or end stage renal disease: Secondary | ICD-10-CM | POA: Diagnosis not present

## 2020-06-19 DIAGNOSIS — Z87891 Personal history of nicotine dependence: Secondary | ICD-10-CM | POA: Insufficient documentation

## 2020-06-19 DIAGNOSIS — E781 Pure hyperglyceridemia: Secondary | ICD-10-CM | POA: Diagnosis present

## 2020-06-19 DIAGNOSIS — I2582 Chronic total occlusion of coronary artery: Secondary | ICD-10-CM | POA: Diagnosis not present

## 2020-06-19 DIAGNOSIS — I209 Angina pectoris, unspecified: Secondary | ICD-10-CM

## 2020-06-19 DIAGNOSIS — D509 Iron deficiency anemia, unspecified: Secondary | ICD-10-CM | POA: Insufficient documentation

## 2020-06-19 DIAGNOSIS — I25110 Atherosclerotic heart disease of native coronary artery with unstable angina pectoris: Secondary | ICD-10-CM | POA: Diagnosis present

## 2020-06-19 DIAGNOSIS — D631 Anemia in chronic kidney disease: Secondary | ICD-10-CM | POA: Diagnosis present

## 2020-06-19 HISTORY — PX: LEFT HEART CATH AND CORONARY ANGIOGRAPHY: CATH118249

## 2020-06-19 SURGERY — LEFT HEART CATH AND CORONARY ANGIOGRAPHY
Anesthesia: LOCAL

## 2020-06-19 MED ORDER — HEPARIN SODIUM (PORCINE) 1000 UNIT/ML IJ SOLN
INTRAMUSCULAR | Status: AC
  Filled 2020-06-19: qty 1

## 2020-06-19 MED ORDER — VERAPAMIL HCL 2.5 MG/ML IV SOLN
INTRAVENOUS | Status: AC
  Filled 2020-06-19: qty 2

## 2020-06-19 MED ORDER — MIDAZOLAM HCL 2 MG/2ML IJ SOLN
INTRAMUSCULAR | Status: DC | PRN
  Administered 2020-06-19: 1 mg via INTRAVENOUS

## 2020-06-19 MED ORDER — FENTANYL CITRATE (PF) 100 MCG/2ML IJ SOLN
INTRAMUSCULAR | Status: AC
  Filled 2020-06-19: qty 2

## 2020-06-19 MED ORDER — ONDANSETRON HCL 4 MG/2ML IJ SOLN: 4.0000 mg | Freq: Four times a day (QID) | INTRAMUSCULAR | Status: DC | PRN

## 2020-06-19 MED ORDER — SODIUM CHLORIDE 0.9% FLUSH: 3.0000 mL | INTRAVENOUS | Status: DC | PRN

## 2020-06-19 MED ORDER — SODIUM CHLORIDE 0.9 % IV SOLN: INTRAVENOUS | Status: DC

## 2020-06-19 MED ORDER — MIDAZOLAM HCL 2 MG/2ML IJ SOLN
INTRAMUSCULAR | Status: AC
  Filled 2020-06-19: qty 2

## 2020-06-19 MED ORDER — LIDOCAINE HCL (PF) 1 % IJ SOLN
INTRAMUSCULAR | Status: DC | PRN
  Administered 2020-06-19: 2 mL via SUBCUTANEOUS

## 2020-06-19 MED ORDER — HYDRALAZINE HCL 20 MG/ML IJ SOLN: 10.0000 mg | INTRAMUSCULAR | Status: DC | PRN

## 2020-06-19 MED ORDER — HEPARIN (PORCINE) IN NACL 1000-0.9 UT/500ML-% IV SOLN
INTRAVENOUS | Status: AC
  Filled 2020-06-19: qty 1000

## 2020-06-19 MED ORDER — SODIUM CHLORIDE 0.9 % WEIGHT BASED INFUSION: 1.0000 mL/kg/h | INTRAVENOUS | Status: DC

## 2020-06-19 MED ORDER — ACETAMINOPHEN 325 MG PO TABS: 650.0000 mg | ORAL_TABLET | ORAL | Status: DC | PRN

## 2020-06-19 MED ORDER — SODIUM CHLORIDE 0.9% FLUSH: 3.0000 mL | Freq: Two times a day (BID) | INTRAVENOUS | Status: DC

## 2020-06-19 MED ORDER — IOHEXOL 350 MG/ML SOLN
INTRAVENOUS | Status: DC | PRN
  Administered 2020-06-19: 25 mL via INTRA_ARTERIAL

## 2020-06-19 MED ORDER — LABETALOL HCL 5 MG/ML IV SOLN: 10.0000 mg | INTRAVENOUS | Status: DC | PRN

## 2020-06-19 MED ORDER — SODIUM CHLORIDE 0.9 % WEIGHT BASED INFUSION
3.0000 mL/kg/h | INTRAVENOUS | Status: AC
  Administered 2020-06-19: 3 mL/kg/h via INTRAVENOUS

## 2020-06-19 MED ORDER — FENTANYL CITRATE (PF) 100 MCG/2ML IJ SOLN
INTRAMUSCULAR | Status: DC | PRN
  Administered 2020-06-19: 25 ug via INTRAVENOUS

## 2020-06-19 MED ORDER — HEPARIN (PORCINE) IN NACL 1000-0.9 UT/500ML-% IV SOLN
INTRAVENOUS | Status: DC | PRN
  Administered 2020-06-19 (×3): 500 mL

## 2020-06-19 MED ORDER — SODIUM CHLORIDE 0.9 % IV SOLN: 250.0000 mL | INTRAVENOUS | Status: DC | PRN

## 2020-06-19 MED ORDER — ASPIRIN 81 MG PO CHEW: 81.0000 mg | CHEWABLE_TABLET | ORAL | Status: DC

## 2020-06-19 MED ORDER — VERAPAMIL HCL 2.5 MG/ML IV SOLN
INTRAVENOUS | Status: DC | PRN
  Administered 2020-06-19: 10 mL via INTRA_ARTERIAL

## 2020-06-19 MED ORDER — ISOSORBIDE MONONITRATE ER 30 MG PO TB24
30.0000 mg | ORAL_TABLET | Freq: Every day | ORAL | 11 refills | Status: DC
Start: 2020-06-19 — End: 2020-07-13

## 2020-06-19 MED ORDER — HEPARIN SODIUM (PORCINE) 1000 UNIT/ML IJ SOLN
INTRAMUSCULAR | Status: DC | PRN
  Administered 2020-06-19: 3000 [IU] via INTRAVENOUS

## 2020-06-19 MED ORDER — LIDOCAINE HCL (PF) 1 % IJ SOLN
INTRAMUSCULAR | Status: AC
  Filled 2020-06-19: qty 30

## 2020-06-19 SURGICAL SUPPLY — 11 items
CATH INFINITI 5 FR JL3.5 (CATHETERS) ×1 IMPLANT
CATH INFINITI JR4 5F (CATHETERS) ×1 IMPLANT
DEVICE RAD COMP TR BAND LRG (VASCULAR PRODUCTS) ×1 IMPLANT
GLIDESHEATH SLEND A-KIT 6F 22G (SHEATH) ×1 IMPLANT
GUIDEWIRE INQWIRE 1.5J.035X260 (WIRE) IMPLANT
INQWIRE 1.5J .035X260CM (WIRE) ×2
KIT HEART LEFT (KITS) ×2 IMPLANT
PACK CARDIAC CATHETERIZATION (CUSTOM PROCEDURE TRAY) ×2 IMPLANT
SHEATH PROBE COVER 6X72 (BAG) ×1 IMPLANT
TRANSDUCER W/STOPCOCK (MISCELLANEOUS) ×2 IMPLANT
TUBING CIL FLEX 10 FLL-RA (TUBING) ×2 IMPLANT

## 2020-06-19 NOTE — Discharge Instructions (Signed)
Radial Site Care  This sheet gives you information about how to care for yourself after your procedure. Your health care provider may also give you more specific instructions. If you have problems or questions, contact your health care provider. What can I expect after the procedure? After the procedure, it is common to have:  Bruising and tenderness at the catheter insertion area. Follow these instructions at home: Medicines  Take over-the-counter and prescription medicines only as told by your health care provider. Insertion site care  Follow instructions from your health care provider about how to take care of your insertion site. Make sure you: ? Wash your hands with soap and water before you change your bandage (dressing). If soap and water are not available, use hand sanitizer. ? Change your dressing as told by your health care provider. ? Leave stitches (sutures), skin glue, or adhesive strips in place. These skin closures may need to stay in place for 2 weeks or longer. If adhesive strip edges start to loosen and curl up, you may trim the loose edges. Do not remove adhesive strips completely unless your health care provider tells you to do that.  Check your insertion site every day for signs of infection. Check for: ? Redness, swelling, or pain. ? Fluid or blood. ? Pus or a bad smell. ? Warmth.  Do not take baths, swim, or use a hot tub until your health care provider approves.  You may shower 24-48 hours after the procedure, or as directed by your health care provider. ? Remove the dressing and gently wash the site with plain soap and water. ? Pat the area dry with a clean towel. ? Do not rub the site. That could cause bleeding.  Do not apply powder or lotion to the site. Activity   For 24 hours after the procedure, or as directed by your health care provider: ? Do not flex or bend the affected arm. ? Do not push or pull heavy objects with the affected arm. ? Do not  drive yourself home from the hospital or clinic. You may drive 24 hours after the procedure unless your health care provider tells you not to. ? Do not operate machinery or power tools.  Do not lift anything that is heavier than 10 lb (4.5 kg), or the limit that you are told, until your health care provider says that it is safe.  Ask your health care provider when it is okay to: ? Return to work or school. ? Resume usual physical activities or sports. ? Resume sexual activity. General instructions  If the catheter site starts to bleed, raise your arm and put firm pressure on the site. If the bleeding does not stop, get help right away. This is a medical emergency.  If you went home on the same day as your procedure, a responsible adult should be with you for the first 24 hours after you arrive home.  Keep all follow-up visits as told by your health care provider. This is important. Contact a health care provider if:  You have a fever.  You have redness, swelling, or yellow drainage around your insertion site. Get help right away if:  You have unusual pain at the radial site.  The catheter insertion area swells very fast.  The insertion area is bleeding, and the bleeding does not stop when you hold steady pressure on the area.  Your arm or hand becomes pale, cool, tingly, or numb. These symptoms may represent a serious problem   that is an emergency. Do not wait to see if the symptoms will go away. Get medical help right away. Call your local emergency services (911 in the [place] Do not drive yourself to the hospital. Summary  After the procedure, it is common to have bruising and tenderness at the site.  Follow instructions from your health care provider about how to take care of your radial site wound. Check the wound every day for signs of infection.  Do not lift anything that is heavier than 10 lb (4.5 kg), or the limit that you are told, until your health care provider says  that it is safe. This information is not intended to replace advice given to you by your health care provider. Make sure you discuss any questions you have with your health care provider. Document Revised: 11/25/2017 Document Reviewed: 11/25/2017 Elsevier Patient Education  2020 Elsevier Inc.  

## 2020-06-19 NOTE — CV Procedure (Signed)
CONCLUSIONS:   Dominant right coronary which is totally occluded distally.  The distal RCA fills by left to right collaterals from both LAD and circumflex territory.  Patent left main  Luminal irregularities in the LAD but no significant obstructive disease.  Large first obtuse marginal contains 70 to 80% stenosis in the mid body.  The continuation of the circumflex beyond a small second obtuse marginal is 70 to 80% narrowed.  LVEDP is 9 mmHg.  Left ventriculography not performed.  RECOMMENDATIONS:   Outpatient status.  Spoke with Dr. Pearson Grippe, [facility], who will take responsibility for follow-up creatinine in 3 days (at his recommendation), and will arrange it to be performed at his office.  Spoke with Dr. Eleonore Chiquito who will follow up with the patient in 1 week.  We previously discussed performing a basic metabolic panel in 48 hours, but after speaking with Dr. Joelyn Oms, the follow-up blood work will be by [facility].  Increase medical therapy intensity.  If continued angina, CTO on RCA and possibly stenting the first obtuse marginal and distal circumflex if warranted by symptoms after the patient is on dialysis.

## 2020-06-19 NOTE — Interval H&P Note (Signed)
Cath Lab Visit (complete for each Cath Lab visit)  Clinical Evaluation Leading to the Procedure:   ACS: No.  Non-ACS:    Anginal Classification: CCS III  Anti-ischemic medical therapy: Minimal Therapy (1 class of medications)  Non-Invasive Test Results: No non-invasive testing performed  Prior CABG: No previous CABG      History and Physical Interval Note:  06/19/2020 11:48 AM  Shara Blazing  has presented today for surgery, with the diagnosis of unstable angina.  The various methods of treatment have been discussed with the patient and family. After consideration of risks, benefits and other options for treatment, the patient has consented to  Procedure(s): LEFT HEART CATH AND CORONARY ANGIOGRAPHY (N/A) as a surgical intervention.  The patient's history has been reviewed, patient examined, no change in status, stable for surgery.  I have reviewed the patient's chart and labs.  Questions were answered to the patient's satisfaction.     Belva Crome III

## 2020-06-20 ENCOUNTER — Encounter (HOSPITAL_COMMUNITY): Payer: Self-pay | Admitting: Interventional Cardiology

## 2020-06-21 ENCOUNTER — Telehealth: Payer: Self-pay | Admitting: Cardiovascular Disease

## 2020-06-21 ENCOUNTER — Other Ambulatory Visit: Payer: Self-pay

## 2020-06-21 ENCOUNTER — Telehealth: Payer: Self-pay

## 2020-06-21 ENCOUNTER — Encounter: Payer: Self-pay | Admitting: Cardiovascular Disease

## 2020-06-21 MED ORDER — NITROGLYCERIN 0.4 MG SL SUBL: 0.4000 mg | SUBLINGUAL_TABLET | SUBLINGUAL | 3 refills | Status: DC | PRN

## 2020-06-21 NOTE — Telephone Encounter (Signed)
Received a call from patient's PCP Tobie Lords FNP.Stated he has patient in office now with unstable angina and sob.Stated he is sending patient to [facility] ED via EMS.Advised I will make Dr.O'Neal aware.

## 2020-06-21 NOTE — Telephone Encounter (Signed)
error 

## 2020-06-21 NOTE — Telephone Encounter (Signed)
*  STAT* If patient is at the pharmacy, call can be transferred to refill team.   1. Which medications need to be refilled? (please list name of each medication and dose if known) nitroGLYCERIN (NITROSTAT) 0.4 MG SL tablet  2. Which pharmacy/location (including street and city if local pharmacy) is medication to be sent to? [id number] - [place], [place]  3. Do they need a 30 day or 90 day supply? Pedro Young

## 2020-06-21 NOTE — Telephone Encounter (Signed)
He just had cath. He has CTO of the RCA. I wonder what his symptoms were at the PCP office. We will see when he gets here.   [place] T. Audie Box, [place]  [address], [place], [place] [address] [phone]  4:56 PM

## 2020-07-02 NOTE — Telephone Encounter (Signed)
Seen PCP on 08/24- okay with follow up on 09/10 with Dr.O'Neal per note from PCP.

## 2020-07-04 ENCOUNTER — Ambulatory Visit: Payer: Medicare HMO | Admitting: Cardiology

## 2020-07-12 NOTE — Progress Notes (Signed)
Cardiology Office Note:   Date:  07/13/2020  NAME:  Pedro Young    MRN: [id number] DOB:  Oct 13, 1933   PCP:  Gregor Hams, FNP  Cardiologist:  No primary care provider on file.   Referring MD: Gregor Hams, FNP   Chief Complaint  Patient presents with   Coronary Artery Disease   History of Present Illness:   Pedro Young is a 86 y.o. male with a hx of CAD, HTN, CKD V who presents for follow-up. He was evaluated recently for stable angina. LHC with occluded dRCA with L to R collaterals. 70% LCX disease. Treated medically. Kidney function has remained stable.   He reports he is not on hemodialysis yet.  He does have a mature left AV fistula.  He reports he still is getting chest pain with exertion still.  When he walks slowly does not have chest pain but when he does any heavy exertion he does get squeezing pressure pain in the left side of his chest.  Symptoms resolved with nitroglycerin.  He reports he is taking 2-3 sublingual nitros daily.  He also can get chest pain at night when he lays down.  Apparently he does any heavy activity all day he will get symptoms.  Apparently when he takes it easy he does not have significant symptoms of angina.  He reports not having any issues taking his aspirin.  He is on Lasix per nephrology.  He is on metoprolol tartrate 25 mg twice daily and Imdur 30 mg daily.  He is on Crestor 40 mg daily.  He is not had any pain at rest.  He seems to be stable but still having a lot of stable angina.  Problem List 1. CKD V/ESRD 2. HTN 3. Iron deficiency 4. CAD -100% occluded distal RCA with L to R collaterals  -70% OM1 -70% OM2 5. T chol 212, HDL 37, LDL 145, TG 148  Past Medical History: Past Medical History:  Diagnosis Date   Cancer ([place])    Hypertension    Renal disorder    CRF    Past Surgical History: Past Surgical History:  Procedure Laterality Date   AV FISTULA PLACEMENT Left 05/04/2019   Procedure: Arteriovenous (Av) Fistula  Creation Left Arm;  Surgeon: Rosetta Posner, MD;  Location: [facility];  Service: Vascular;  Laterality: Left;   BIOPSY  08/10/2019   Procedure: BIOPSY;  Surgeon: Jackquline Denmark, MD;  Location: [facility];  Service: Endoscopy;;   CHOLECYSTECTOMY     COLONOSCOPY N/A 08/10/2019   Procedure: COLONOSCOPY;  Surgeon: Jackquline Denmark, MD;  Location: [facility];  Service: Endoscopy;  Laterality: N/A;   EYE SURGERY Bilateral    cataract   LEFT HEART CATH AND CORONARY ANGIOGRAPHY N/A 06/19/2020   Procedure: LEFT HEART CATH AND CORONARY ANGIOGRAPHY;  Surgeon: Belva Crome, MD;  Location: [facility];  Service: Cardiovascular;  Laterality: N/A;   POLYPECTOMY  08/10/2019   Procedure: POLYPECTOMY;  Surgeon: Jackquline Denmark, MD;  Location: [facility];  Service: Endoscopy;;    Current Medications: Current Meds  Medication Sig   aspirin EC 81 MG tablet Take 1 tablet (81 mg total) by mouth daily. Swallow whole.   furosemide (LASIX) 40 MG tablet Take 40 mg by mouth daily. Prescribed by Dr Lorrene Reid [facility]) since May 2016    isosorbide mononitrate (IMDUR) 30 MG 24 hr tablet Take 1 tablet (30 mg total) by mouth daily.   metoprolol tartrate (LOPRESSOR) 25 MG tablet Take 1 tablet (  25 mg total) by mouth 2 (two) times daily.   nitroGLYCERIN (NITROSTAT) 0.4 MG SL tablet Place 1 tablet (0.4 mg total) under the tongue every 5 (five) minutes as needed for chest pain.   rosuvastatin (CRESTOR) 40 MG tablet Take 1 tablet (40 mg total) by mouth daily.   tamsulosin (FLOMAX) 0.4 MG CAPS capsule Take 0.4 mg by mouth daily.   [DISCONTINUED] aspirin EC 81 MG tablet Take 1 tablet (81 mg total) by mouth daily. Swallow whole.   [DISCONTINUED] isosorbide mononitrate (IMDUR) 30 MG 24 hr tablet Take 1 tablet (30 mg total) by mouth daily.   [DISCONTINUED] nitroGLYCERIN (NITROSTAT) 0.4 MG SL tablet Place 1 tablet (0.4 mg total) under the tongue every 5 (five) minutes as needed for chest pain.   [DISCONTINUED]  rosuvastatin (CRESTOR) 40 MG tablet Take 1 tablet (40 mg total) by mouth daily.     Allergies:    Amoxicillin-pot clavulanate, Atorvastatin, and Lovastatin   Social History: Social History   Socioeconomic History   Marital status: Married    Spouse name: Not on file   Number of children: Not on file   Years of education: Not on file   Highest education level: Not on file  Occupational History   Not on file  Tobacco Use   Smoking status: Former Smoker    Quit date: 1956    Years since quitting: 65.7   Smokeless tobacco: Former Systems developer    Quit date: 11/03/1954  Vaping Use   Vaping Use: Never used  Substance and Sexual Activity   Alcohol use: No    Alcohol/week: 0.0 standard drinks   Drug use: No   Sexual activity: Not on file  Other Topics Concern   Not on file  Social History Narrative   From [place]   Moved to [place] to [place] 2006   5 daughters   Social Determinants of Radio broadcast assistant Strain:    Difficulty of Paying Living Expenses: Not on file  Food Insecurity:    Worried About Charity fundraiser in the Last Year: Not on file   [employer] of Food in the Last Year: Not on file  Transportation Needs:    Lack of Transportation (Medical): Not on file   Lack of Transportation (Non-Medical): Not on file  Physical Activity:    Days of Exercise per Week: Not on file   Minutes of Exercise per Session: Not on file  Stress:    Feeling of Stress : Not on file  Social Connections:    Frequency of Communication with Friends and Family: Not on file   Frequency of Social Gatherings with Friends and Family: Not on file   Attends Religious Services: Not on file   Active Member of Clubs or Organizations: Not on file   Attends Archivist Meetings: Not on file   Marital Status: Not on file     Family History: The patient's family history includes Heart disease in his father and mother; Kidney disease in his father; Liver  disease in his father.  ROS:   All other ROS reviewed and negative. Pertinent positives noted in the HPI.     EKGs/Labs/Other Studies Reviewed:   The following studies were personally reviewed by me today:  EKG:  EKG is ordered today.  The ekg ordered today demonstrates normal sinus rhythm, heart rate 61, inferior T wave abnormality noted, and was personally reviewed by me.   LHC 06/19/2020  Dominant right  coronary which is totally occluded distally.  The distal RCA fills by left to right collaterals from both LAD and circumflex territory.  Patent left main  Luminal irregularities in the LAD but no significant obstructive disease.  Large first obtuse marginal contains 70 to 80% stenosis in the mid body.  The continuation of the circumflex beyond a small second obtuse marginal is 70 to 80% narrowed.  LVEDP is 9 mmHg.  Left ventriculography not performed.    Recent Labs: 09/13/2019: ALT 8; Magnesium 2.1 06/15/2020: BUN 84; Creatinine, Ser 4.40; Hemoglobin 11.9; Platelets 132; Potassium 5.4; Sodium 139   Recent Lipid Panel    Component Value Date/Time   CHOL 175 08/02/2015 1705   TRIG 128 08/02/2015 1705   HDL 33 (L) 08/02/2015 1705   CHOLHDL 5.3 08/02/2015 1705   VLDL 26 08/02/2015 1705   LDLCALC 116 (H) 08/02/2015 1705    Physical Exam:   VS:  BP 136/68    Pulse 61    Ht 5\' 7"  (1.702 m)    Wt 123 lb (55.8 kg)    SpO2 98%    BMI 19.26 kg/m    Wt Readings from Last 3 Encounters:  07/13/20 123 lb (55.8 kg)  06/19/20 120 lb (54.4 kg)  06/15/20 119 lb 6.4 oz (54.2 kg)    General: Well nourished, well developed, in no acute distress Heart: Atraumatic, normal size  Eyes: PEERLA, EOMI  Neck: Supple, no JVD Endocrine: No thryomegaly Cardiac: Normal S1, S2; RRR; no murmurs, rubs, or gallops Lungs: Clear to auscultation bilaterally, no wheezing, rhonchi or rales  Abd: Soft, nontender, no hepatomegaly  Ext: No edema, pulses 2+ Musculoskeletal: No deformities, BUE and BLE  strength normal and equal Skin: Warm and dry, no rashes   Neuro: Alert and oriented to person, place, time, and situation, CNII-XII grossly intact, no focal deficits  Psych: Normal mood and affect   ASSESSMENT:   Pedro Young is a 86 y.o. male who presents for the following: 1. Coronary artery disease of native artery of native heart with stable angina pectoris ([place])   2. Essential hypertension   3. Mixed hyperlipidemia     PLAN:   1. Coronary artery disease of native artery of native heart with stable angina pectoris ([place]) -Left heart cath on 06/19/2020 with 100% occluded distal RCA with collaterals.  He has 60% disease in the circumflex territory. -PCI was deferred due to significant kidney dysfunction and collateralization.  He still has symptoms of angina.  We will add Norvasc 10 mg daily.  He will continue metoprolol tartrate 25 mg twice daily.  No room to move on his beta-blocker as his heart rate is in the 60s.  He also is on Imdur 30.  We will try to further titrate his antianginals to avoid PCI.  If we cannot get his angina to acceptable level, we will have to pursue PCI.  I would like for him to be on hemodialysis.  He reports he will be this kidney doctor in 2 weeks.  I will see him back in 1 month which will be 2 weeks after his appointment. -Continue aspirin 81 mg daily.  Continue Crestor 40 mg daily.  We do need to recheck lipid profile in the next few months.  2. Essential hypertension -Add Norvasc as above.  Continue metoprolol and Imdur.  3. Mixed hyperlipidemia -Continue Lipitor as above.  Will need to repeat at some point.  Goal LDL cholesterol less than 70.  Disposition: Return in about 1  month (around 08/12/2020).  Medication Adjustments/Labs and Tests Ordered: Current medicines are reviewed at length with the patient today.  Concerns regarding medicines are outlined above.  Orders Placed This Encounter  Procedures   EKG 12-Lead   Meds ordered this encounter   Medications   isosorbide mononitrate (IMDUR) 30 MG 24 hr tablet    Sig: Take 1 tablet (30 mg total) by mouth daily.    Dispense:  30 tablet    Refill:  11   rosuvastatin (CRESTOR) 40 MG tablet    Sig: Take 1 tablet (40 mg total) by mouth daily.    Dispense:  90 tablet    Refill:  3   nitroGLYCERIN (NITROSTAT) 0.4 MG SL tablet    Sig: Place 1 tablet (0.4 mg total) under the tongue every 5 (five) minutes as needed for chest pain.    Dispense:  90 tablet    Refill:  3   aspirin EC 81 MG tablet    Sig: Take 1 tablet (81 mg total) by mouth daily. Swallow whole.    Dispense:  90 tablet    Refill:  3   amLODipine (NORVASC) 10 MG tablet    Sig: Take 1 tablet (10 mg total) by mouth daily.    Dispense:  180 tablet    Refill:  3    Patient Instructions  Medication Instructions:  Start Amlodipine 10 mg daily   *If you need a refill on your cardiac medications before your next appointment, please call your pharmacy*   Follow-Up: At [facility], you and your health needs are our priority.  As part of our continuing mission to provide you with exceptional heart care, we have created designated Provider Care Teams.  These Care Teams include your primary Cardiologist (physician) and Advanced Practice Providers (APPs -  Physician Assistants and Nurse Practitioners) who all work together to provide you with the care you need, when you need it.  We recommend signing up for the patient portal called "MyChart".  Sign up information is provided on this After Visit Summary.  MyChart is used to connect with patients for Virtual Visits (Telemedicine).  Patients are able to view lab/test results, encounter notes, upcoming appointments, etc.  Non-urgent messages can be sent to your provider as well.   To learn more about what you can do with MyChart, go to [email or web address].    Your next appointment:   1 month(s)  The format for your next appointment:   In Person  Provider:   Eleonore Chiquito, MD       Time Spent with Patient: I have spent a total of 35 minutes with patient reviewing hospital notes, telemetry, EKGs, labs and examining the patient as well as establishing an assessment and plan that was discussed with the patient.  > 50% of time was spent in direct patient care.  Signed, Addison Naegeli. Audie Box, [place]  [address], [place], [place] [address] [phone]  07/13/2020 2:57 PM

## 2020-07-13 ENCOUNTER — Encounter: Payer: Self-pay | Admitting: Cardiovascular Disease

## 2020-07-13 ENCOUNTER — Ambulatory Visit (INDEPENDENT_AMBULATORY_CARE_PROVIDER_SITE_OTHER): Payer: Medicare HMO | Admitting: Cardiovascular Disease

## 2020-07-13 ENCOUNTER — Other Ambulatory Visit: Payer: Self-pay

## 2020-07-13 VITALS — BP 136/68 | HR 61 | Ht 67.0 in | Wt 123.0 lb

## 2020-07-13 DIAGNOSIS — I25118 Atherosclerotic heart disease of native coronary artery with other forms of angina pectoris: Secondary | ICD-10-CM

## 2020-07-13 DIAGNOSIS — I10 Essential (primary) hypertension: Secondary | ICD-10-CM | POA: Diagnosis not present

## 2020-07-13 DIAGNOSIS — E782 Mixed hyperlipidemia: Secondary | ICD-10-CM

## 2020-07-13 MED ORDER — AMLODIPINE BESYLATE 10 MG PO TABS
10.0000 mg | ORAL_TABLET | Freq: Every day | ORAL | 3 refills | Status: DC
Start: 2020-07-13 — End: 2021-07-16

## 2020-07-13 MED ORDER — ASPIRIN EC 81 MG PO TBEC: 81.0000 mg | DELAYED_RELEASE_TABLET | Freq: Every day | ORAL | 3 refills | Status: DC

## 2020-07-13 MED ORDER — ISOSORBIDE MONONITRATE ER 30 MG PO TB24: 30.0000 mg | ORAL_TABLET | Freq: Every day | ORAL | 11 refills | Status: DC

## 2020-07-13 MED ORDER — NITROGLYCERIN 0.4 MG SL SUBL: 0.4000 mg | SUBLINGUAL_TABLET | SUBLINGUAL | 3 refills | Status: DC | PRN

## 2020-07-13 MED ORDER — ROSUVASTATIN CALCIUM 40 MG PO TABS: 40.0000 mg | ORAL_TABLET | Freq: Every day | ORAL | 3 refills | Status: DC

## 2020-07-13 NOTE — Patient Instructions (Signed)
Medication Instructions:  Start Amlodipine 10 mg daily   *If you need a refill on your cardiac medications before your next appointment, please call your pharmacy*   Follow-Up: At [facility], you and your health needs are our priority.  As part of our continuing mission to provide you with exceptional heart care, we have created designated Provider Care Teams.  These Care Teams include your primary Cardiologist (physician) and Advanced Practice Providers (APPs -  Physician Assistants and Nurse Practitioners) who all work together to provide you with the care you need, when you need it.  We recommend signing up for the patient portal called "MyChart".  Sign up information is provided on this After Visit Summary.  MyChart is used to connect with patients for Virtual Visits (Telemedicine).  Patients are able to view lab/test results, encounter notes, upcoming appointments, etc.  Non-urgent messages can be sent to your provider as well.   To learn more about what you can do with MyChart, go to [email or web address].    Your next appointment:   1 month(s)  The format for your next appointment:   In Person  Provider:   Eleonore Chiquito, MD

## 2020-07-27 ENCOUNTER — Ambulatory Visit: Payer: Medicare HMO | Admitting: Internal Medicine

## 2020-08-01 ENCOUNTER — Other Ambulatory Visit: Payer: Self-pay

## 2020-08-01 ENCOUNTER — Ambulatory Visit (HOSPITAL_COMMUNITY): Payer: Medicare HMO | Attending: Internal Medicine

## 2020-08-01 DIAGNOSIS — I25118 Atherosclerotic heart disease of native coronary artery with other forms of angina pectoris: Secondary | ICD-10-CM | POA: Diagnosis not present

## 2020-08-01 LAB — ECHOCARDIOGRAM COMPLETE
Area-P 1/2: 2.91 cm2
S' Lateral: 2.3 cm

## 2020-08-20 NOTE — Progress Notes (Signed)
Cardiology Office Note:   Date:  08/21/2020  NAME:  Pedro Young    MRN: [id number] DOB:  12-09-1932   PCP:  Gregor Hams, FNP  Cardiologist:  Evalina Field, MD   Referring MD: Gregor Hams, FNP   Chief Complaint  Patient presents with  . Coronary Artery Disease   History of Present Illness:   Pedro Young is a 86 y.o. male with a hx of CKD V, CAD, HTN, IDA who presents for follow-up. Started on norvasc for antianginal. On metoprolol and imdur. Follow-up today. He reports he is doing well. He describes no further chest pain with exertion. We have him on Imdur, metoprolol and amlodipine. Blood pressure is stable at 110/50. Volume status is well controlled. He is not on dialysis yet. He hopes to avoid this. Most recent LDL cholesterol not at goal. We do need to recheck this at some point. He reports overall he is doing well. He denies chest pain, shortness of breath or palpitations in office.  Problem List 1. CKD V/ESRD 2. HTN 3. Iron deficiency 4. CAD -100% occluded distal RCA with L to R collaterals  -70% OM1 -70% OM2 5. T chol 212, HDL 37, LDL 145, TG 148  Past Medical History: Past Medical History:  Diagnosis Date  . Cancer ([place])   . Hypertension   . Renal disorder    CRF    Past Surgical History: Past Surgical History:  Procedure Laterality Date  . AV FISTULA PLACEMENT Left 05/04/2019   Procedure: Arteriovenous (Av) Fistula Creation Left Arm;  Surgeon: Rosetta Posner, MD;  Location: [facility];  Service: Vascular;  Laterality: Left;  . BIOPSY  08/10/2019   Procedure: BIOPSY;  Surgeon: Jackquline Denmark, MD;  Location: [facility];  Service: Endoscopy;;  . CHOLECYSTECTOMY    . COLONOSCOPY N/A 08/10/2019   Procedure: COLONOSCOPY;  Surgeon: Jackquline Denmark, MD;  Location: [facility];  Service: Endoscopy;  Laterality: N/A;  . EYE SURGERY Bilateral    cataract  . LEFT HEART CATH AND CORONARY ANGIOGRAPHY N/A 06/19/2020   Procedure: LEFT HEART CATH AND CORONARY  ANGIOGRAPHY;  Surgeon: Belva Crome, MD;  Location: [facility];  Service: Cardiovascular;  Laterality: N/A;  . POLYPECTOMY  08/10/2019   Procedure: POLYPECTOMY;  Surgeon: Jackquline Denmark, MD;  Location: [facility];  Service: Endoscopy;;    Current Medications: Current Meds  Medication Sig  . amLODipine (NORVASC) 10 MG tablet Take 1 tablet (10 mg total) by mouth daily.  Marland Kitchen aspirin EC 81 MG tablet Take 1 tablet (81 mg total) by mouth daily. Swallow whole.  . furosemide (LASIX) 40 MG tablet Take 40 mg by mouth daily. Prescribed by Dr Lorrene Reid [facility]) since May 2016   . isosorbide mononitrate (IMDUR) 30 MG 24 hr tablet Take 1 tablet (30 mg total) by mouth daily.  . metoprolol tartrate (LOPRESSOR) 25 MG tablet Take 1 tablet (25 mg total) by mouth 2 (two) times daily.  . rosuvastatin (CRESTOR) 40 MG tablet Take 1 tablet (40 mg total) by mouth daily.  . tamsulosin (FLOMAX) 0.4 MG CAPS capsule Take 0.4 mg by mouth daily.     Allergies:    Amoxicillin-pot clavulanate, Atorvastatin, and Lovastatin   Social History: Social History   Socioeconomic History  . Marital status: Married    Spouse name: Not on file  . Number of children: Not on file  . Years of education: Not on file  . Highest education level: Not on file  Occupational History  .  Not on file  Tobacco Use  . Smoking status: Former Smoker    Quit date: 1956    Years since quitting: 65.8  . Smokeless tobacco: Former Systems developer    Quit date: 11/03/1954  Vaping Use  . Vaping Use: Never used  Substance and Sexual Activity  . Alcohol use: No    Alcohol/week: 0.0 standard drinks  . Drug use: No  . Sexual activity: Not on file  Other Topics Concern  . Not on file  Social History Narrative   From [place]   Moved to [place] to [place] 2006   5 daughters   Social Determinants of Radio broadcast assistant Strain:   . Difficulty of Paying Living Expenses: Not on file  Food Insecurity:   . Worried About  Charity fundraiser in the Last Year: Not on file  . Ran Out of Food in the Last Year: Not on file  Transportation Needs:   . Lack of Transportation (Medical): Not on file  . Lack of Transportation (Non-Medical): Not on file  Physical Activity:   . Days of Exercise per Week: Not on file  . Minutes of Exercise per Session: Not on file  Stress:   . Feeling of Stress : Not on file  Social Connections:   . Frequency of Communication with Friends and Family: Not on file  . Frequency of Social Gatherings with Friends and Family: Not on file  . Attends Religious Services: Not on file  . Active Member of Clubs or Organizations: Not on file  . Attends Archivist Meetings: Not on file  . Marital Status: Not on file     Family History: The patient's family history includes Heart disease in his father and mother; Kidney disease in his father; Liver disease in his father.  ROS:   All other ROS reviewed and negative. Pertinent positives noted in the HPI.     EKGs/Labs/Other Studies Reviewed:   The following studies were personally reviewed by me today:  TTE 08/01/2020 1. Left ventricular ejection fraction by 3D volume is 66 %. The left  ventricle has normal function. The left ventricle demonstrates regional  wall motion abnormalities (see scoring diagram/findings for description).  There is mild left ventricular  hypertrophy. Left ventricular diastolic parameters are consistent with  Grade I diastolic dysfunction (impaired relaxation). The average left  ventricular global longitudinal strain is -19.7 %. The global longitudinal  strain is normal.  2. Right ventricular systolic function is normal. The right ventricular  size is normal. There is normal pulmonary artery systolic pressure. The  estimated right ventricular systolic pressure is 44.0 mmHg.  3. Left atrial size was mildly dilated.  4. The mitral valve is grossly normal. Mild mitral valve regurgitation.  No evidence of  mitral stenosis.  5. Tricuspid valve regurgitation is moderate.  6. The aortic valve is grossly normal. There is mild calcification of the  aortic valve. There is mild thickening of the aortic valve. Aortic valve  regurgitation is trivial. No aortic stenosis is present.  7. The inferior vena cava is dilated in size with >50% respiratory  variability, suggesting right atrial pressure of 8 mmHg.   LHC 06/19/2020  Dominant right coronary which is totally occluded distally.  The distal RCA fills by left to right collaterals from both LAD and circumflex territory.  Patent left main  Luminal irregularities in the LAD but no significant obstructive disease.  Large first obtuse marginal contains 70 to  80% stenosis in the mid body.  The continuation of the circumflex beyond a small second obtuse marginal is 70 to 80% narrowed.  LVEDP is 9 mmHg.  Left ventriculography not performed.  RECOMMENDATIONS:   Outpatient status.  Spoke with Dr. Pearson Grippe, [facility], who will take responsibility for follow-up creatinine in 3 days (at his recommendation), and will arrange it to be performed at his office.  Spoke with Dr. Eleonore Chiquito who will follow up with the patient in 1 week.  We previously discussed performing a basic metabolic panel in 48 hours, but after speaking with Dr. Joelyn Oms, the follow-up blood work will be by [facility].  Increase medical therapy intensity.  If continued angina, CTO on RCA and possibly stenting the first obtuse marginal and distal circumflex if warranted by symptoms after the patient is on dialysis.  Total contrast 21 cc.   Recent Labs: 09/13/2019: ALT 8; Magnesium 2.1 06/15/2020: BUN 84; Creatinine, Ser 4.40; Hemoglobin 11.9; Platelets 132; Potassium 5.4; Sodium 139   Recent Lipid Panel    Component Value Date/Time   CHOL 175 08/02/2015 1705   TRIG 128 08/02/2015 1705   HDL 33 (L) 08/02/2015 1705   CHOLHDL 5.3 08/02/2015  1705   VLDL 26 08/02/2015 1705   LDLCALC 116 (H) 08/02/2015 1705    Physical Exam:   VS:  BP (!) 110/50   Pulse 74   Ht 5\' 7"  (1.702 m)   Wt 122 lb (55.3 kg)   SpO2 98%   BMI 19.11 kg/m    Wt Readings from Last 3 Encounters:  08/21/20 122 lb (55.3 kg)  07/13/20 123 lb (55.8 kg)  06/19/20 120 lb (54.4 kg)    General: Well nourished, well developed, in no acute distress Heart: Atraumatic, normal size  Eyes: PEERLA, EOMI  Neck: Supple, no JVD Endocrine: No thryomegaly Cardiac: Normal S1, S2; RRR; no murmurs, rubs, or gallops Lungs: Clear to auscultation bilaterally, no wheezing, rhonchi or rales  Abd: Soft, nontender, no hepatomegaly  Ext: No edema, pulses 2+ Musculoskeletal: No deformities, BUE and BLE strength normal and equal Skin: Warm and dry, no rashes   Neuro: Alert and oriented to person, place, time, and situation, CNII-XII grossly intact, no focal deficits  Psych: Normal mood and affect   ASSESSMENT:   Jasmon Graffam is a 86 y.o. male who presents for the following: 1. Coronary artery disease of native artery of native heart with stable angina pectoris ([place])   2. Mixed hyperlipidemia   3. Essential hypertension     PLAN:   1. Coronary artery disease of native artery of native heart with stable angina pectoris ([facility]) -100% occluded distal RCA with L to R collaterals  -70% OM1 -70% OM2 -Evaluated with progressively worsening stable angina. Left heart cath as above. PCI was deferred as he had an occluded distal RCA with left-to-right collaterals. He was having worsening angina and we will treat him medically. Seems to have reached a good regimen as he has no further chest pain. We will continue his amlodipine 10 mg daily, Imdur 30, Toprol tartrate 25 mg twice daily. He will remain on aspirin. He is on Crestor 40 mg daily. He is not fasting but we do need to repeat lipid profile. He will come back in the next 2 weeks for this. -Ejection fraction is normal. No  significant valvular heart disease.  2. Mixed hyperlipidemia -Continue Crestor 40 mg daily. Repeat lipid profile in the next few weeks.  3. Essential hypertension -  BP well controlled. On above medications.   Disposition: Return in about 6 months (around 02/19/2021).  Medication Adjustments/Labs and Tests Ordered: Current medicines are reviewed at length with the patient today.  Concerns regarding medicines are outlined above.  Orders Placed This Encounter  Procedures  . Lipid panel   No orders of the defined types were placed in this encounter.   Patient Instructions  Medication Instructions:  Your physician recommends that you continue on your current medications as directed. Please refer to the Current Medication list given to you today.  *If you need a refill on your cardiac medications before your next appointment, please call your pharmacy*   Lab Work: FASTING lipid panel @ Dr. Kathalene Frames office in the next week   If you have labs (blood work) drawn today and your tests are completely normal, you will receive your results only by: Marland Kitchen MyChart Message (if you have MyChart) OR . A paper copy in the mail If you have any lab test that is abnormal or we need to change your treatment, we will call you to review the results.   Testing/Procedures: NONE   Follow-Up: At [facility], you and your health needs are our priority.  As part of our continuing mission to provide you with exceptional heart care, we have created designated Provider Care Teams.  These Care Teams include your primary Cardiologist (physician) and Advanced Practice Providers (APPs -  Physician Assistants and Nurse Practitioners) who all work together to provide you with the care you need, when you need it.  We recommend signing up for the patient portal called "MyChart".  Sign up information is provided on this After Visit Summary.  MyChart is used to connect with patients for Virtual Visits (Telemedicine).   Patients are able to view lab/test results, encounter notes, upcoming appointments, etc.  Non-urgent messages can be sent to your provider as well.   To learn more about what you can do with MyChart, go to [email or web address].    Your next appointment:   6 month(s)  The format for your next appointment:   In Person  Provider:   You may see Dr. Audie Box or one of the following Advanced Practice Providers on your designated Care Team:    Almyra Deforest, PA-C  Fabian Sharp, [place] or   Roby Lofts, [place]    Other Instructions      Time Spent with Patient: I have spent a total of 25 minutes with patient reviewing hospital notes, telemetry, EKGs, labs and examining the patient as well as establishing an assessment and plan that was discussed with the patient.  > 50% of time was spent in direct patient care.  Signed, Addison Naegeli. Audie Box, [place]  [address], [place], [place] [address] [phone]  08/21/2020 3:30 PM

## 2020-08-21 ENCOUNTER — Ambulatory Visit (INDEPENDENT_AMBULATORY_CARE_PROVIDER_SITE_OTHER): Payer: Medicare HMO | Admitting: Cardiovascular Disease

## 2020-08-21 ENCOUNTER — Encounter: Payer: Self-pay | Admitting: Cardiovascular Disease

## 2020-08-21 ENCOUNTER — Other Ambulatory Visit: Payer: Self-pay

## 2020-08-21 VITALS — BP 110/50 | HR 74 | Ht 67.0 in | Wt 122.0 lb

## 2020-08-21 DIAGNOSIS — I25118 Atherosclerotic heart disease of native coronary artery with other forms of angina pectoris: Secondary | ICD-10-CM | POA: Diagnosis not present

## 2020-08-21 DIAGNOSIS — I10 Essential (primary) hypertension: Secondary | ICD-10-CM

## 2020-08-21 DIAGNOSIS — E782 Mixed hyperlipidemia: Secondary | ICD-10-CM

## 2020-08-21 NOTE — Patient Instructions (Signed)
Medication Instructions:  Your physician recommends that you continue on your current medications as directed. Please refer to the Current Medication list given to you today.  *If you need a refill on your cardiac medications before your next appointment, please call your pharmacy*   Lab Work: FASTING lipid panel @ Dr. Kathalene Frames office in the next week   If you have labs (blood work) drawn today and your tests are completely normal, you will receive your results only by: Marland Kitchen MyChart Message (if you have MyChart) OR . A paper copy in the mail If you have any lab test that is abnormal or we need to change your treatment, we will call you to review the results.   Testing/Procedures: NONE   Follow-Up: At [facility], you and your health needs are our priority.  As part of our continuing mission to provide you with exceptional heart care, we have created designated Provider Care Teams.  These Care Teams include your primary Cardiologist (physician) and Advanced Practice Providers (APPs -  Physician Assistants and Nurse Practitioners) who all work together to provide you with the care you need, when you need it.  We recommend signing up for the patient portal called "MyChart".  Sign up information is provided on this After Visit Summary.  MyChart is used to connect with patients for Virtual Visits (Telemedicine).  Patients are able to view lab/test results, encounter notes, upcoming appointments, etc.  Non-urgent messages can be sent to your provider as well.   To learn more about what you can do with MyChart, go to [email or web address].    Your next appointment:   6 month(s)  The format for your next appointment:   In Person  Provider:   You may see Dr. Audie Box or one of the following Advanced Practice Providers on your designated Care Team:    Almyra Deforest, PA-C  Fabian Sharp, [place] or   Roby Lofts, [place]    Other Instructions

## 2020-08-29 LAB — LIPID PANEL
Chol/HDL Ratio: 2.7 ratio (ref 0.0–5.0)
Cholesterol, Total: 126 mg/dL (ref 100–199)
HDL: 46 mg/dL (ref >39)
LDL Chol Calc (NIH): 58 mg/dL (ref 0–99)
Triglycerides: 125 mg/dL (ref 0–149)
VLDL Cholesterol Cal: 22 mg/dL (ref 5–40)

## 2020-08-30 ENCOUNTER — Encounter: Payer: Self-pay | Admitting: *Deleted

## 2020-10-05 ENCOUNTER — Other Ambulatory Visit: Payer: Self-pay | Admitting: Cardiovascular Disease

## 2021-01-30 ENCOUNTER — Encounter: Payer: Self-pay | Admitting: Cardiovascular Disease

## 2021-01-30 ENCOUNTER — Ambulatory Visit (INDEPENDENT_AMBULATORY_CARE_PROVIDER_SITE_OTHER): Payer: Medicare HMO | Admitting: Cardiovascular Disease

## 2021-01-30 ENCOUNTER — Other Ambulatory Visit: Payer: Self-pay

## 2021-01-30 VITALS — BP 132/51 | HR 62 | Ht 67.0 in | Wt 115.4 lb

## 2021-01-30 DIAGNOSIS — I10 Essential (primary) hypertension: Secondary | ICD-10-CM | POA: Diagnosis not present

## 2021-01-30 DIAGNOSIS — I25118 Atherosclerotic heart disease of native coronary artery with other forms of angina pectoris: Secondary | ICD-10-CM

## 2021-01-30 DIAGNOSIS — E782 Mixed hyperlipidemia: Secondary | ICD-10-CM | POA: Diagnosis not present

## 2021-01-30 MED ORDER — METOPROLOL TARTRATE 50 MG PO TABS: 50.0000 mg | ORAL_TABLET | Freq: Two times a day (BID) | ORAL | 3 refills | Status: DC

## 2021-01-30 NOTE — Patient Instructions (Signed)
Medication Instructions:  Increase Metoprolol to 50 mg twice daily   *If you need a refill on your cardiac medications before your next appointment, please call your pharmacy*   Follow-Up: At [facility], you and your health needs are our priority.  As part of our continuing mission to provide you with exceptional heart care, we have created designated Provider Care Teams.  These Care Teams include your primary Cardiologist (physician) and Advanced Practice Providers (APPs -  Physician Assistants and Nurse Practitioners) who all work together to provide you with the care you need, when you need it.  We recommend signing up for the patient portal called "MyChart".  Sign up information is provided on this After Visit Summary.  MyChart is used to connect with patients for Virtual Visits (Telemedicine).  Patients are able to view lab/test results, encounter notes, upcoming appointments, etc.  Non-urgent messages can be sent to your provider as well.   To learn more about what you can do with MyChart, go to [email or web address].    Your next appointment:   6 month(s)  The format for your next appointment:   In Person  Provider:   Eleonore Chiquito, MD

## 2021-01-30 NOTE — Progress Notes (Signed)
Cardiology Office Note:   Date:  01/30/2021  NAME:  Arthuro Canelo    MRN: [id number] DOB:  29-Mar-1933   PCP:  Gregor Hams, FNP  Cardiologist:  Evalina Field, MD  Electrophysiologist:  None   Referring MD: Gregor Hams, FNP   Chief Complaint  Patient presents with  . Coronary Artery Disease   History of Present Illness:   Dakota Vanwart is a 87 y.o. male with a hx of CKD stage V (not on dialysis yet, iron deficiency anemia, CAD, hyperlipidemia who presents for follow-up of CAD.  Left heart catheterization last year demonstrated a CTO of the distal RCA.  He also has 70% OM1 and OM 2 disease.  Medical management was opted as he has CKD stage V not yet on dialysis.  He seems to be doing well.  He reports he can get some chest pressure with very heavy exertion such as shoveling snow.  He reports activity such as walking does not bother him.  He really should not be shoveling snow anyway at his age.  He reports he had maybe one episode in a month where he woke up in the middle night with chest pain.  Resolved with nitro x1.  We did discuss that medical management is the recommendation for him currently.  Should he end up on dialysis he could pursue PCI to the RCA versus OM branches.  For now he seems to be doing well on dialysis.  Most recent LDL cholesterol at goal, 58.  BP 132/51.  He does report that blood pressures ranging between 140 and 150 at home.  I think better blood pressure control helping.  He overall seems to be doing well.  Problem List 1. CKD V/ESRD 2. HTN 3. Iron deficiency 4. CAD -100% occluded distal RCA with L to R collaterals  -70% OM1 -70% OM2 5. HLD -Total cholesterol 126, HDL 46, LDL 58, triglycerides 125   Past Medical History: Past Medical History:  Diagnosis Date  . Cancer ([place])   . Hypertension   . Renal disorder    CRF    Past Surgical History: Past Surgical History:  Procedure Laterality Date  . AV FISTULA PLACEMENT Left 05/04/2019    Procedure: Arteriovenous (Av) Fistula Creation Left Arm;  Surgeon: Rosetta Posner, MD;  Location: [place];  Service: Vascular;  Laterality: Left;  . BIOPSY  08/10/2019   Procedure: BIOPSY;  Surgeon: Jackquline Denmark, MD;  [facility];  Service: Endoscopy;;  . CHOLECYSTECTOMY    . COLONOSCOPY N/A 08/10/2019   Procedure: COLONOSCOPY;  Surgeon: Jackquline Denmark, MD;  [facility];  Service: Endoscopy;  Laterality: N/A;  . EYE SURGERY Bilateral    cataract  . LEFT HEART CATH AND CORONARY ANGIOGRAPHY N/A 06/19/2020   Procedure: LEFT HEART CATH AND CORONARY ANGIOGRAPHY;  Surgeon: Belva Crome, MD;  Location: [facility];  Service: Cardiovascular;  Laterality: N/A;  . POLYPECTOMY  08/10/2019   Procedure: POLYPECTOMY;  Surgeon: Jackquline Denmark, MD;  [facility];  Service: Endoscopy;;    Current Medications: Current Meds  Medication Sig  . aspirin EC 81 MG tablet Take 1 tablet (81 mg total) by mouth daily. Swallow whole.  . furosemide (LASIX) 40 MG tablet Take 40 mg by mouth daily. Prescribed by Dr Lorrene Reid [facility]) since May 2016  . isosorbide mononitrate (IMDUR) 30 MG 24 hr tablet Take 1 tablet (30 mg total) by mouth daily.  . nitroGLYCERIN (NITROSTAT) 0.4 MG SL tablet PLACE 1  TABLET UNDER THE TONGUE EVERY 5 MINUTES AS NEEDED FOR CHEST PAIN  . tamsulosin (FLOMAX) 0.4 MG CAPS capsule Take 0.4 mg by mouth daily.     Allergies:    Amoxicillin-pot clavulanate, Atorvastatin, and Lovastatin   Social History: Social History   Socioeconomic History  . Marital status: Married    Spouse name: Not on file  . Number of children: Not on file  . Years of education: Not on file  . Highest education level: Not on file  Occupational History  . Not on file  Tobacco Use  . Smoking status: Former Smoker    Quit date: 1956    Years since quitting: 95.2  . Smokeless tobacco: Former Systems developer    Quit date: 11/03/1954  Vaping Use  . Vaping Use: Never used  Substance and  Sexual Activity  . Alcohol use: No    Alcohol/week: 0.0 standard drinks  . Drug use: No  . Sexual activity: Not on file  Other Topics Concern  . Not on file  Social History Narrative   From [place]   Moved to [place] to [place] 2006   5 daughters   Social Determinants of Radio broadcast assistant Strain: Not on [employer] Insecurity: Not on file  Transportation Needs: Not on file  Physical Activity: Not on file  Stress: Not on file  Social Connections: Not on file     Family History: The patient's family history includes Heart disease in his father and mother; Kidney disease in his father; Liver disease in his father.  ROS:   All other ROS reviewed and negative. Pertinent positives noted in the HPI.     EKGs/Labs/Other Studies Reviewed:   The following studies were personally reviewed by me today:   [place] 06/19/2020  Dominant right coronary which is totally occluded distally.  The distal RCA fills by left to right collaterals from both LAD and circumflex territory.  Patent left main  Luminal irregularities in the LAD but no significant obstructive disease.  Large first obtuse marginal contains 70 to 80% stenosis in the mid body.  The continuation of the circumflex beyond a small second obtuse marginal is 70 to 80% narrowed.  LVEDP is 9 mmHg.  Left ventriculography not performed.  RECOMMENDATIONS:   Outpatient status.  Spoke with Dr. Pearson Grippe, [facility], who will take responsibility for follow-up creatinine in 3 days (at his recommendation), and will arrange it to be performed at his office.  Spoke with Dr. Eleonore Chiquito who will follow up with the patient in 1 week.  We previously discussed performing a basic metabolic panel in 48 hours, but after speaking with Dr. Joelyn Oms, the follow-up blood work will be by [facility].  Increase medical therapy intensity.  If continued angina, CTO on RCA and possibly stenting the  first obtuse marginal and distal circumflex if warranted by symptoms after the patient is on dialysis.  Total contrast 21 cc.  TTE 08/01/2020 1. Left ventricular ejection fraction by 3D volume is 66 %. The left  ventricle has normal function. The left ventricle demonstrates regional  wall motion abnormalities (see scoring diagram/findings for description).  There is mild left ventricular  hypertrophy. Left ventricular diastolic parameters are consistent with  Grade I diastolic dysfunction (impaired relaxation). The average left  ventricular global longitudinal strain is -19.7 %. The global longitudinal  strain is normal.  2. Right ventricular systolic function is normal. The right ventricular  size is  normal. There is normal pulmonary artery systolic pressure. The  estimated right ventricular systolic pressure is 78.5 mmHg.  3. Left atrial size was mildly dilated.  4. The mitral valve is grossly normal. Mild mitral valve regurgitation.  No evidence of mitral stenosis.  5. Tricuspid valve regurgitation is moderate.  6. The aortic valve is grossly normal. There is mild calcification of the  aortic valve. There is mild thickening of the aortic valve. Aortic valve  regurgitation is trivial. No aortic stenosis is present.  7. The inferior vena cava is dilated in size with >50% respiratory  variability, suggesting right atrial pressure of 8 mmHg.   Recent Labs: 06/15/2020: BUN 84; Creatinine, Ser 4.40; Hemoglobin 11.9; Platelets 132; Potassium 5.4; Sodium 139   Recent Lipid Panel    Component Value Date/Time   CHOL 126 08/29/2020 0921   TRIG 125 08/29/2020 0921   HDL 46 08/29/2020 0921   CHOLHDL 2.7 08/29/2020 0921   CHOLHDL 5.3 08/02/2015 1705   VLDL 26 08/02/2015 1705   LDLCALC 58 08/29/2020 0921    Physical Exam:   VS:  BP (!) 132/51   Pulse 62   Ht 5\' 7"  (1.702 m)   Wt 115 lb 6.4 oz (52.3 kg)   SpO2 100%   BMI 18.07 kg/m    Wt Readings from Last 3 Encounters:   01/30/21 115 lb 6.4 oz (52.3 kg)  08/21/20 122 lb (55.3 kg)  07/13/20 123 lb (55.8 kg)    General: Well nourished, well developed, in no acute distress Head: Atraumatic, normal size  Eyes: PEERLA, EOMI  Neck: Supple, no JVD Endocrine: No thryomegaly Cardiac: Normal S1, S2; RRR; 2 out of 6 holosystolic murmur Lungs: Clear to auscultation bilaterally, no wheezing, rhonchi or rales  Abd: Soft, nontender, no hepatomegaly  Ext: No edema, pulses 2+ Musculoskeletal: No deformities, BUE and BLE strength normal and equal Skin: Warm and dry, no rashes   Neuro: Alert and oriented to person, place, time, and situation, CNII-XII grossly intact, no focal deficits  Psych: Normal mood and affect   ASSESSMENT:   Sachit Gilman is a 87 y.o. male who presents for the following: 1. Coronary artery disease of native artery of native heart with stable angina pectoris ([place])   2. Mixed hyperlipidemia   3. Primary hypertension     PLAN:   1. Coronary artery disease of native artery of native heart with stable angina pectoris ([place]) -Seen last year with stable angina.  Left heart catheterization demonstrated CTO of the distal RCA with good collateral flow.  He has 70% lesions in the OM1 OM 2 branches.  Medical management was recommended as he has CKD stage V.  Not on dialysis yet.  He is wanting to avoid dialysis as long as possible. -Intervention could be considered to the distal RCA versus the OM branches.  However his symptoms appear to be stable.  He reports when he is walking or doing most activities he has no exertional angina.  Only with heavy activity such as heavy lifting as he did symptoms of angina.  He reports he also had a 1 nighttime episode of awakening with chest pain that resolved with nitro.  Anytime he gets nitro it appears to resolve very quickly with nitroglycerin. -For now we will continue to recommend medical management.  BP slightly elevated at home.  We will increase his metoprolol  tartrate to 50 mg twice a day. -Continue aspirin and statin.  Most recent LDL 58. Cholesterol is at goal  2. Mixed hyperlipidemia -Continue statin.  LDL at goal.  3. Primary hypertension -Increase metoprolol to tartrate 50 mg twice a day.  Disposition: Return in about 6 months (around 08/02/2021).  Medication Adjustments/Labs and Tests Ordered: Current medicines are reviewed at length with the patient today.  Concerns regarding medicines are outlined above.  No orders of the defined types were placed in this encounter.  Meds ordered this encounter  Medications  . metoprolol tartrate (LOPRESSOR) 50 MG tablet    Sig: Take 1 tablet (50 mg total) by mouth 2 (two) times daily.    Dispense:  180 tablet    Refill:  3    Patient Instructions  Medication Instructions:  Increase Metoprolol to 50 mg twice daily   *If you need a refill on your cardiac medications before your next appointment, please call your pharmacy*   Follow-Up: At [facility], you and your health needs are our priority.  As part of our continuing mission to provide you with exceptional heart care, we have created designated Provider Care Teams.  These Care Teams include your primary Cardiologist (physician) and Advanced Practice Providers (APPs -  Physician Assistants and Nurse Practitioners) who all work together to provide you with the care you need, when you need it.  We recommend signing up for the patient portal called "MyChart".  Sign up information is provided on this After Visit Summary.  MyChart is used to connect with patients for Virtual Visits (Telemedicine).  Patients are able to view lab/test results, encounter notes, upcoming appointments, etc.  Non-urgent messages can be sent to your provider as well.   To learn more about what you can do with MyChart, go to [email or web address].    Your next appointment:   6 month(s)  The format for your next appointment:   In Person  Provider:   Eleonore Chiquito, MD        Time Spent with Patient: I have spent a total of 25 minutes with patient reviewing hospital notes, telemetry, EKGs, labs and examining the patient as well as establishing an assessment and plan that was discussed with the patient.  > 50% of time was spent in direct patient care.  Signed, Addison Naegeli. Audie Box, MD, [place]  [address] [place], [place] [address] [phone]  01/30/2021 1:31 PM

## 2021-03-01 ENCOUNTER — Ambulatory Visit: Payer: Medicare HMO | Admitting: Cardiovascular Disease

## 2021-07-16 ENCOUNTER — Other Ambulatory Visit: Payer: Self-pay | Admitting: Cardiovascular Disease

## 2021-07-31 NOTE — Progress Notes (Signed)
Cardiology Office Note:   Date:  08/01/2021  NAME:  Pedro Young    MRN: [id number] DOB:  01/16/1933   PCP:  Pedro Hams, FNP  Cardiologist:  Pedro Field, MD  Electrophysiologist:  None   Referring MD: Pedro Hams, FNP   Chief Complaint  Patient presents with   Coronary Artery Disease    History of Present Illness:   Pedro Young is a 87 y.o. male with a hx of CAD, CKD V, HTN, HLD who presents for follow-up.  He reports he is doing well.  He appears to be taking labetalol and metoprolol.  I encouraged him to stop labetalol.  He should only be on metoprolol.  He also stopped taking a baby aspirin.  He does suffer from anemia.  Most recent hemoglobin 9.8.  This is related to his kidney disease.  He has not started dialysis yet but does have a left upper extremity AV fistula.  He reports that he is not having any chest pain symptoms with activity.  He reports in the last 6 months he has had 2 episodes where he wakes up in the middle the night with chest tightness.  This appears to resolved with nitroglycerin as a single dose.  I informed him that should he take more than 3 doses he would need to go to the emergency room.  LDL cholesterol was at goal last year.  He does have known CAD.  BP 115/62.  Pulse 66.  Seems to be optimized from a medical standpoint.  Just need to get his medications sorted out.  Problem List 1. CKD V/ESRD 2. HTN 3. Iron deficiency 4. CAD -LHC 06/19/2020 -100% occluded distal RCA with L to R collaterals  -70% OM1 -70% OM2 5. HLD -Total cholesterol 126, HDL 46, LDL 58, triglycerides 125  Past Medical History: Past Medical History:  Diagnosis Date   Cancer ([place])    Hypertension    Renal disorder    CRF    Past Surgical History: Past Surgical History:  Procedure Laterality Date   AV FISTULA PLACEMENT Left 05/04/2019   Procedure: Arteriovenous (Av) Fistula Creation Left Arm;  Surgeon: Rosetta Posner, MD;  Location: [place];  Service:  Vascular;  Laterality: Left;   BIOPSY  08/10/2019   Procedure: BIOPSY;  Surgeon: Jackquline Denmark, MD;  [facility];  Service: Endoscopy;;   CHOLECYSTECTOMY     COLONOSCOPY N/A 08/10/2019   Procedure: COLONOSCOPY;  Surgeon: Jackquline Denmark, MD;  [facility];  Service: Endoscopy;  Laterality: N/A;   EYE SURGERY Bilateral    cataract   LEFT HEART CATH AND CORONARY ANGIOGRAPHY N/A 06/19/2020   Procedure: LEFT HEART CATH AND CORONARY ANGIOGRAPHY;  Surgeon: Belva Crome, MD;  Location: [facility];  Service: Cardiovascular;  Laterality: N/A;   POLYPECTOMY  08/10/2019   Procedure: POLYPECTOMY;  Surgeon: Jackquline Denmark, MD;  [facility];  Service: Endoscopy;;    Current Medications: Current Meds  Medication Sig   amLODipine (NORVASC) 10 MG tablet TAKE 1 TABLET BY MOUTH EVERY DAY   furosemide (LASIX) 40 MG tablet Take 40 mg by mouth daily. Prescribed by Dr Lorrene Reid [facility]) since May 2016   nitroGLYCERIN (NITROSTAT) 0.4 MG SL tablet PLACE 1 TABLET UNDER THE TONGUE EVERY 5 MINUTES AS NEEDED FOR CHEST PAIN   tamsulosin (FLOMAX) 0.4 MG CAPS capsule Take 0.4 mg by mouth daily.   [DISCONTINUED] aspirin EC 81 MG tablet Take 1 tablet (81 mg total) by mouth  daily. Swallow whole.   [DISCONTINUED] labetalol (NORMODYNE) 200 MG tablet Take 200 mg by mouth 2 (two) times daily.     Allergies:    Amoxicillin-pot clavulanate, Atorvastatin, and Lovastatin   Social History: Social History   Socioeconomic History   Marital status: Married    Spouse name: Not on file   Number of children: Not on file   Years of education: Not on file   Highest education level: Not on file  Occupational History   Not on file  Tobacco Use   Smoking status: Former    Types: Cigarettes    Quit date: 1956    Years since quitting: 66.7   Smokeless tobacco: Former    Quit date: 11/03/1954  Vaping Use   Vaping Use: Never used  Substance and Sexual Activity   Alcohol use: No     Alcohol/week: 0.0 standard drinks   Drug use: No   Sexual activity: Not on file  Other Topics Concern   Not on file  Social History Narrative   From [place]   Moved to [place] to [place] 2006   5 daughters   Social Determinants of Radio broadcast assistant Strain: Not on file  Food Insecurity: Not on file  Transportation Needs: Not on file  Physical Activity: Not on file  Stress: Not on file  Social Connections: Not on file     Family History: The patient's family history includes Heart disease in his father and mother; Kidney disease in his father; Liver disease in his father.  ROS:   All other ROS reviewed and negative. Pertinent positives noted in the HPI.     EKGs/Labs/Other Studies Reviewed:   The following studies were personally reviewed by me today:  Recent Labs: No results found for requested labs within last 8760 hours.   Recent Lipid Panel    Component Value Date/Time   CHOL 126 08/29/2020 0921   TRIG 125 08/29/2020 0921   HDL 46 08/29/2020 0921   CHOLHDL 2.7 08/29/2020 0921   CHOLHDL 5.3 08/02/2015 1705   VLDL 26 08/02/2015 1705   LDLCALC 58 08/29/2020 0921    Physical Exam:   VS:  BP 115/62   Pulse 66   Ht 5\' 7"  (1.702 m)   Wt 119 lb (54 kg)   SpO2 96%   BMI 18.64 kg/m    Wt Readings from Last 3 Encounters:  08/01/21 119 lb (54 kg)  01/30/21 115 lb 6.4 oz (52.3 kg)  08/21/20 122 lb (55.3 kg)    General: Well nourished, well developed, in no acute distress Head: Atraumatic, normal size  Eyes: PEERLA, EOMI  Neck: Supple, no JVD Endocrine: No thryomegaly Cardiac: Normal S1, S2; RRR; no murmurs, rubs, or gallops Lungs: Clear to auscultation bilaterally, no wheezing, rhonchi or rales  Abd: Soft, nontender, no hepatomegaly  Ext: No edema, pulses 2+ Musculoskeletal: No deformities, BUE and BLE strength normal and equal Skin: Warm and dry, no rashes   Neuro: Alert and oriented to person, place, time, and situation, CNII-XII grossly  intact, no focal deficits  Psych: Normal mood and affect   ASSESSMENT:   Pedro Young is a 87 y.o. male who presents for the following: 1. Coronary artery disease of native artery of native heart with stable angina pectoris ([place])   2. Mixed hyperlipidemia   3. Primary hypertension     PLAN:   1. Coronary artery disease of native artery of native heart with stable angina pectoris (  [place]) 2. Mixed hyperlipidemia -Diagnosed with unstable angina in August 2021.  Found to have an occluded distal RCA with left-to-right collaterals.  Also has 70% OM1 and OM 2 disease.  He has CKD stage V but is not yet on dialysis.  Medical management was recommended and he has done well.  He describes infrequent episodes of chest pain that respond well to nitroglycerin.  I see no need to repeat heart catheterization.  We will continue with medical therapy.  For some reason he is taking labetalol with his metoprolol.  We will stop labetalol.  Continue metoprolol tartrate 50 mg twice daily.  He will continue amlodipine 10 mg daily.  He is on Imdur 30 mg daily.  He stopped taking aspirin and he will restart this.  He should take 81 mg daily.  Most recent hemoglobin 9.8.  He does suffer from anemia due to chronic kidney disease but his values are stable and there is no sign of bleeding.  He will continue Crestor 40 mg daily.  Most recent LDL at goal.  For now we will continue with optimizing medical therapy.  He seems to be doing well without any signs concerning for unstable angina.  3. Primary hypertension -Continue amlodipine 10 mg daily, Toprol tartrate 50 mg twice daily.  Disposition: Return in about 6 months (around 01/29/2022).  Medication Adjustments/Labs and Tests Ordered: Current medicines are reviewed at length with the patient today.  Concerns regarding medicines are outlined above.  No orders of the defined types were placed in this encounter.  Meds ordered this encounter  Medications   aspirin EC 81 MG  tablet    Sig: Take 1 tablet (81 mg total) by mouth daily. Swallow whole.    Dispense:  90 tablet    Refill:  3   metoprolol tartrate (LOPRESSOR) 50 MG tablet    Sig: Take 1 tablet (50 mg total) by mouth 2 (two) times daily.    Dispense:  180 tablet    Refill:  3     Patient Instructions  Medication Instructions:  STOP LABETALOL   *If you need a refill on your cardiac medications before your next appointment, please call your pharmacy*   Follow-Up: At [facility], you and your health needs are our priority.  As part of our continuing mission to provide you with exceptional heart care, we have created designated Provider Care Teams.  These Care Teams include your primary Cardiologist (physician) and Advanced Practice Providers (APPs -  Physician Assistants and Nurse Practitioners) who all work together to provide you with the care you need, when you need it.  We recommend signing up for the patient portal called "MyChart".  Sign up information is provided on this After Visit Summary.  MyChart is used to connect with patients for Virtual Visits (Telemedicine).  Patients are able to view lab/test results, encounter notes, upcoming appointments, etc.  Non-urgent messages can be sent to your provider as well.   To learn more about what you can do with MyChart, go to [email or web address].    Your next appointment:   6 month(s)  The format for your next appointment:   In Person  Provider:   Eleonore Chiquito, MD     Time Spent with Patient: I have spent a total of 35 minutes with patient reviewing hospital notes, telemetry, EKGs, labs and examining the patient as well as establishing an assessment and plan that was discussed with the patient.  > 50% of time was spent in direct patient  care.  Signed, Addison Naegeli. Audie Box, MD, [place]  [address], [place], [place] [address] [phone]  08/01/2021 9:28 AM

## 2021-08-01 ENCOUNTER — Other Ambulatory Visit: Payer: Self-pay

## 2021-08-01 ENCOUNTER — Encounter: Payer: Self-pay | Admitting: Cardiovascular Disease

## 2021-08-01 ENCOUNTER — Ambulatory Visit (INDEPENDENT_AMBULATORY_CARE_PROVIDER_SITE_OTHER): Payer: Medicare HMO | Admitting: Cardiovascular Disease

## 2021-08-01 VITALS — BP 115/62 | HR 66 | Ht 67.0 in | Wt 119.0 lb

## 2021-08-01 DIAGNOSIS — I25118 Atherosclerotic heart disease of native coronary artery with other forms of angina pectoris: Secondary | ICD-10-CM | POA: Diagnosis not present

## 2021-08-01 DIAGNOSIS — E782 Mixed hyperlipidemia: Secondary | ICD-10-CM

## 2021-08-01 DIAGNOSIS — I10 Essential (primary) hypertension: Secondary | ICD-10-CM | POA: Diagnosis not present

## 2021-08-01 MED ORDER — ASPIRIN EC 81 MG PO TBEC: 81.0000 mg | DELAYED_RELEASE_TABLET | Freq: Every day | ORAL | 3 refills | Status: DC

## 2021-08-01 MED ORDER — METOPROLOL TARTRATE 50 MG PO TABS: 50.0000 mg | ORAL_TABLET | Freq: Two times a day (BID) | ORAL | 3 refills | Status: DC

## 2021-08-01 NOTE — Patient Instructions (Signed)
Medication Instructions:  STOP LABETALOL   *If you need a refill on your cardiac medications before your next appointment, please call your pharmacy*   Follow-Up: At [facility], you and your health needs are our priority.  As part of our continuing mission to provide you with exceptional heart care, we have created designated Provider Care Teams.  These Care Teams include your primary Cardiologist (physician) and Advanced Practice Providers (APPs -  Physician Assistants and Nurse Practitioners) who all work together to provide you with the care you need, when you need it.  We recommend signing up for the patient portal called "MyChart".  Sign up information is provided on this After Visit Summary.  MyChart is used to connect with patients for Virtual Visits (Telemedicine).  Patients are able to view lab/test results, encounter notes, upcoming appointments, etc.  Non-urgent messages can be sent to your provider as well.   To learn more about what you can do with MyChart, go to [email or web address].    Your next appointment:   6 month(s)  The format for your next appointment:   In Person  Provider:   Eleonore Chiquito, MD

## 2021-08-23 ENCOUNTER — Other Ambulatory Visit: Payer: Self-pay | Admitting: Cardiovascular Disease

## 2021-09-21 ENCOUNTER — Other Ambulatory Visit: Payer: Self-pay | Admitting: Cardiovascular Disease

## 2021-09-27 ENCOUNTER — Emergency Department (HOSPITAL_COMMUNITY): Payer: Medicare HMO

## 2021-09-27 ENCOUNTER — Other Ambulatory Visit: Payer: Self-pay

## 2021-09-27 ENCOUNTER — Emergency Department (HOSPITAL_COMMUNITY)
Admission: EM | Admit: 2021-09-27 | Discharge: 2021-09-27 | Disposition: A | Discharge status: Home / Self Care | Payer: Medicare HMO | Attending: Emergency Medicine | Admitting: Emergency Medicine

## 2021-09-27 ENCOUNTER — Encounter (HOSPITAL_COMMUNITY): Payer: Self-pay

## 2021-09-27 DIAGNOSIS — Z87891 Personal history of nicotine dependence: Secondary | ICD-10-CM | POA: Insufficient documentation

## 2021-09-27 DIAGNOSIS — Z79899 Other long term (current) drug therapy: Secondary | ICD-10-CM | POA: Diagnosis not present

## 2021-09-27 DIAGNOSIS — N390 Urinary tract infection, site not specified: Secondary | ICD-10-CM

## 2021-09-27 DIAGNOSIS — I129 Hypertensive chronic kidney disease with stage 1 through stage 4 chronic kidney disease, or unspecified chronic kidney disease: Secondary | ICD-10-CM | POA: Diagnosis not present

## 2021-09-27 DIAGNOSIS — R339 Retention of urine, unspecified: Secondary | ICD-10-CM | POA: Diagnosis present

## 2021-09-27 DIAGNOSIS — Z85828 Personal history of other malignant neoplasm of skin: Secondary | ICD-10-CM | POA: Diagnosis not present

## 2021-09-27 DIAGNOSIS — Z7982 Long term (current) use of aspirin: Secondary | ICD-10-CM | POA: Diagnosis not present

## 2021-09-27 DIAGNOSIS — N189 Chronic kidney disease, unspecified: Secondary | ICD-10-CM

## 2021-09-27 DIAGNOSIS — R1084 Generalized abdominal pain: Secondary | ICD-10-CM | POA: Insufficient documentation

## 2021-09-27 DIAGNOSIS — N184 Chronic kidney disease, stage 4 (severe): Secondary | ICD-10-CM | POA: Insufficient documentation

## 2021-09-27 LAB — URINALYSIS, ROUTINE W REFLEX MICROSCOPIC
Bilirubin Urine: NEGATIVE
Glucose, UA: NEGATIVE mg/dL
Ketones, ur: NEGATIVE mg/dL
Nitrite: NEGATIVE
Protein, ur: 100 mg/dL — AB
Specific Gravity, Urine: 1.012 (ref 1.005–1.030)
WBC, UA: >50 WBC/hpf — ABNORMAL HIGH (ref 0–5)
pH: 5 (ref 5.0–8.0)

## 2021-09-27 LAB — COMPREHENSIVE METABOLIC PANEL
ALT: 10 U/L (ref 0–44)
AST: 12 U/L — ABNORMAL LOW (ref 15–41)
Albumin: 3.2 g/dL — ABNORMAL LOW (ref 3.5–5.0)
Alkaline Phosphatase: 75 U/L (ref 38–126)
Anion gap: 11 (ref 5–15)
BUN: 79 mg/dL — ABNORMAL HIGH (ref 8–23)
CO2: 17 mmol/L — ABNORMAL LOW (ref 22–32)
Calcium: 8.2 mg/dL — ABNORMAL LOW (ref 8.9–10.3)
Chloride: 108 mmol/L (ref 98–111)
Creatinine, Ser: 5.18 mg/dL — ABNORMAL HIGH (ref 0.61–1.24)
GFR, Estimated: 10 mL/min — ABNORMAL LOW (ref >60)
Glucose, Bld: 106 mg/dL — ABNORMAL HIGH (ref 70–99)
Potassium: 4.3 mmol/L (ref 3.5–5.1)
Sodium: 136 mmol/L (ref 135–145)
Total Bilirubin: 0.9 mg/dL (ref 0.3–1.2)
Total Protein: 6.2 g/dL — ABNORMAL LOW (ref 6.5–8.1)

## 2021-09-27 LAB — I-STAT CHEM 8, ED
BUN: 87 mg/dL — ABNORMAL HIGH (ref 8–23)
Calcium, Ion: 1.17 mmol/L (ref 1.15–1.40)
Chloride: 110 mmol/L (ref 98–111)
Creatinine, Ser: 5.8 mg/dL — ABNORMAL HIGH (ref 0.61–1.24)
Glucose, Bld: 106 mg/dL — ABNORMAL HIGH (ref 70–99)
HCT: 30 % — ABNORMAL LOW (ref 39.0–52.0)
Hemoglobin: 10.2 g/dL — ABNORMAL LOW (ref 13.0–17.0)
Potassium: 4.3 mmol/L (ref 3.5–5.1)
Sodium: 139 mmol/L (ref 135–145)
TCO2: 18 mmol/L — ABNORMAL LOW (ref 22–32)

## 2021-09-27 LAB — CBC WITH DIFFERENTIAL/PLATELET
Abs Immature Granulocytes: 0.13 10*3/uL — ABNORMAL HIGH (ref 0.00–0.07)
Basophils Absolute: 0 10*3/uL (ref 0.0–0.1)
Basophils Relative: 0 %
Eosinophils Absolute: 0 10*3/uL (ref 0.0–0.5)
Eosinophils Relative: 0 %
HCT: 29.4 % — ABNORMAL LOW (ref 39.0–52.0)
Hemoglobin: 9.8 g/dL — ABNORMAL LOW (ref 13.0–17.0)
Immature Granulocytes: 1 %
Lymphocytes Relative: 9 %
Lymphs Abs: 1.1 10*3/uL (ref 0.7–4.0)
MCH: 31.6 pg (ref 26.0–34.0)
MCHC: 33.3 g/dL (ref 30.0–36.0)
MCV: 94.8 fL (ref 80.0–100.0)
Monocytes Absolute: 1.3 10*3/uL — ABNORMAL HIGH (ref 0.1–1.0)
Monocytes Relative: 10 %
Neutro Abs: 10 10*3/uL — ABNORMAL HIGH (ref 1.7–7.7)
Neutrophils Relative %: 80 %
Platelets: 123 10*3/uL — ABNORMAL LOW (ref 150–400)
RBC: 3.1 MIL/uL — ABNORMAL LOW (ref 4.22–5.81)
RDW: 13.7 % (ref 11.5–15.5)
WBC: 12.5 10*3/uL — ABNORMAL HIGH (ref 4.0–10.5)
nRBC: 0 % (ref 0.0–0.2)

## 2021-09-27 LAB — MAGNESIUM: Magnesium: 1.9 mg/dL (ref 1.7–2.4)

## 2021-09-27 MED ORDER — CEPHALEXIN 250 MG PO CAPS
500.0000 mg | ORAL_CAPSULE | Freq: Once | ORAL | Status: AC
  Administered 2021-09-27: 500 mg via ORAL
  Filled 2021-09-27: qty 2

## 2021-09-27 MED ORDER — SODIUM CHLORIDE 0.9 % IV BOLUS
1000.0000 mL | Freq: Once | INTRAVENOUS | Status: AC
  Administered 2021-09-27: 1000 mL via INTRAVENOUS

## 2021-09-27 MED ORDER — CEPHALEXIN 250 MG PO CAPS: 250.0000 mg | ORAL_CAPSULE | Freq: Every day | ORAL | 0 refills | Status: DC

## 2021-09-27 NOTE — Discharge Instructions (Signed)
You have a urinary tract infection today which is most likely why you are having your urinary symptoms.  You were given a dose of antibiotic here and then you will need to take the antibiotic daily for the next 7 days.  A culture was done so if the cause of the infection is not covered by this antibiotic they will call you and change the antibiotic.  If you start developing fevers, confusion, vomiting or other concerns return to the emergency room.

## 2021-09-27 NOTE — ED Provider Notes (Signed)
Emergency Medicine Provider Triage Evaluation Note  Pedro Young , a 87 y.o. male  was evaluated in triage.  Pt complains of difficulty urinating for the last 2 days.  Feels like he needs to urinate but only passes a couple drops at a time.  Also in the last 48 hours has had a few episodes where he has had sudden urinary incontinence all over himself without any warning.  Denies any fevers or chills at home, denies any abdominal pain.  Stage IV CKD with left forearm AV fistula in preparation for dialysis.  Not currently dialyzed..  Review of Systems  Positive: Urinary frequency and urgency, urinary incontinence, decreased urine in the last 48 hours. Negative: Fevers, chills, chest pain, shortness of breath, palpitations  Physical Exam  BP 118/65 (BP Location: Right Arm)   Pulse 72   Temp 98.3 F (36.8 C)   Resp 18   SpO2 100%  Gen:   Awake, no distress   Resp:  Normal effort  MSK:   Moves extremities without difficulty  Other:  Abdomen soft, nondistended, nontender.  No CVAT.  Medical Decision Making  Medically screening exam initiated at 2:22 PM.  Appropriate orders placed.  Bashir Marchetti was informed that the remainder of the evaluation will be completed by another provider, this initial triage assessment does not replace that evaluation, and the importance of remaining in the ED until their evaluation is complete.  This chart was dictated using voice recognition software, Dragon. Despite the best efforts of this provider to proofread and correct errors, errors may still occur which can change documentation meaning.    Emeline Darling, PA-C 09/27/21 1432    Jeanell Sparrow, DO 09/28/21 [phone]

## 2021-09-27 NOTE — ED Provider Notes (Signed)
Patient's UA today with signs of a UTI with greater than 50 white blood cells, large leukocytes and many bacteria.  He does have a mild leukocytosis of 12.5 and renal function is worse at 5.118 from 4.5.  A Foley catheter was placed to try to obtain urine.  He did have 300 mL out and it is very bloody.  With the infection and his symptoms of urinary retention with dribbling and then incontinence we will leave Foley catheter in start antibiotics and have his PCP or urology remove the catheter in a week's time.  He was instructed to return to the emergency room with any issues such as fever, vomiting, poor appetite or worsening symptoms.  Urine culture is pending.  Patient was started on Keflex.  The Keflex was adjusted for renal impairment and is 250 mg daily for 7 days.     Blanchie Dessert, MD 09/27/21 [phone]

## 2021-09-27 NOTE — ED Triage Notes (Signed)
Pt reports since yesterday he has not been able to urinate as much as he used to. Hx of stage 4 kidney disease, NOT on dialysis but has fistula in left arm that has been there a year just in case. Pt does not feel like he needs to urinate, denies pain

## 2021-09-27 NOTE — ED Provider Notes (Signed)
[facility] EMERGENCY DEPARTMENT Provider Note   CSN: [id number] Arrival date & time: 09/27/21  1323     History Chief Complaint  Patient presents with   Urinary Retention    Pedro Young is a 87 y.o. male.  This is a 87 y.o. male with significant medical history as below, including CKD IV who presents to the ED with complaint of difficulty with urination. Over the past 2 days he has experienced dribbling, difficulty with urination.  Episodes of incontinence.  No saddle paresthesias.  He has sensation that he needs to urinate but when he urinates is only able to express a few drops.  No changes in bowel function.  No nausea or vomiting.  No history of prostate abnormalities.  Does not follow with urology.  No penile or testicular discomfort.  No significant abdominal pain.  The history is provided by the patient. No language interpreter was used.      Past Medical History:  Diagnosis Date   Cancer [facility])    Hypertension    Renal disorder    CRF    Patient Active Problem List   Diagnosis Date Noted   Angina pectoris ([place]) 06/19/2020   Bloody diarrhea    Colitis    Acute gastroenteritis 08/08/2019   Gout 08/08/2015   Acute pancreatitis 08/02/2015   PRURITUS 03/05/2010   Anemia of chronic renal failure, stage 4 (severe) ([place]) 01/18/2010   Liver abscess via hepatic artery 2011, 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 01/18/2010   HEMATURIA, HX OF 01/18/2010   GASTRITIS 06/27/2009   Calculus of gallbladder and bile duct 06/21/2009   AKI (acute kidney injury) ([place]) 06/19/2009   LOSS OF WEIGHT 06/19/2009   ABDOMINAL PAIN, GENERALIZED 06/19/2009   Pure hyperglyceridemia 06/18/2009   HYPERTENSION, BENIGN ESSENTIAL 06/18/2009    Past Surgical History:  Procedure Laterality Date   AV FISTULA PLACEMENT Left 05/04/2019   Procedure: Arteriovenous (Av) Fistula Creation Left Arm;  Surgeon: Rosetta Posner, MD;  Location: [facility];  Service: Vascular;  Laterality: Left;    BIOPSY  08/10/2019   Procedure: BIOPSY;  Surgeon: Jackquline Denmark, MD;  Location: [facility];  Service: Endoscopy;;   CHOLECYSTECTOMY     COLONOSCOPY N/A 08/10/2019   Procedure: COLONOSCOPY;  Surgeon: Jackquline Denmark, MD;  Location: [facility];  Service: Endoscopy;  Laterality: N/A;   EYE SURGERY Bilateral    cataract   LEFT HEART CATH AND CORONARY ANGIOGRAPHY N/A 06/19/2020   Procedure: LEFT HEART CATH AND CORONARY ANGIOGRAPHY;  Surgeon: Belva Crome, MD;  Location: [facility];  Service: Cardiovascular;  Laterality: N/A;   POLYPECTOMY  08/10/2019   Procedure: POLYPECTOMY;  Surgeon: Jackquline Denmark, MD;  Location: [facility];  Service: Endoscopy;;       Family History  Problem Relation Age of Onset   Liver disease Father    Kidney disease Father    Heart disease Father        before age 30   Heart disease Mother        before age 60    Social History   Tobacco Use   Smoking status: Former    Types: Cigarettes    Quit date: 1956    Years since quitting: 66.9   Smokeless tobacco: Former    Quit date: 11/03/1954  Vaping Use   Vaping Use: Never used  Substance Use Topics   Alcohol use: No    Alcohol/week: 0.0 standard drinks   Drug use: No  Home Medications Prior to Admission medications   Medication Sig Start Date End Date Taking? Authorizing Provider  amLODipine (NORVASC) 10 MG tablet TAKE 1 TABLET BY MOUTH EVERY DAY 07/16/21   O'Neal, Cassie Freer, MD  aspirin EC 81 MG tablet Take 1 tablet (81 mg total) by mouth daily. Swallow whole. 08/01/21   O'NealCassie Freer, MD  furosemide (LASIX) 40 MG tablet Take 40 mg by mouth daily. Pres[facility] since May 2016    [provider]  isosorbide mononitrate (IMDUR) 30 MG 24 hr tablet TAKE 1 TABLET BY MOUTH EVERY DAY 09/23/21   O'Neal, Cassie Freer, MD  metoprolol tartrate (LOPRESSOR) 50 MG tablet Take 1 tablet (50 mg total) by mouth 2 (two) times daily. 08/01/21 10/30/21  Geralynn Rile, MD  nitroGLYCERIN (NITROSTAT) 0.4 MG SL tablet PLACE 1 TABLET UNDER THE TONGUE EVERY 5 MINUTES AS NEEDED FOR CHEST PAIN 10/05/20   Geralynn Rile, MD  rosuvastatin (CRESTOR) 40 MG tablet TAKE 1 TABLET BY MOUTH EVERY DAY 08/23/21   O'Neal, Cassie Freer, MD  tamsulosin (FLOMAX) 0.4 MG CAPS capsule Take 0.4 mg by mouth daily. 06/14/20   [provider]    Allergies    Amoxicillin-pot clavulanate, Atorvastatin, and Lovastatin  Review of Systems   Review of Systems  Constitutional:  Negative for chills and fever.  HENT:  Negative for facial swelling and trouble swallowing.   Eyes:  Negative for photophobia and visual disturbance.  Respiratory:  Negative for cough and shortness of breath.   Cardiovascular:  Negative for chest pain and palpitations.  Gastrointestinal:  Negative for abdominal pain, nausea and vomiting.  Endocrine: Negative for polydipsia and polyuria.  Genitourinary:  Positive for decreased urine volume, difficulty urinating and urgency. Negative for hematuria.  Musculoskeletal:  Negative for gait problem and joint swelling.  Skin:  Negative for pallor and rash.  Neurological:  Negative for syncope and headaches.  Psychiatric/Behavioral:  Negative for agitation and confusion.    Physical Exam Updated Vital Signs BP 135/66   Pulse 74   Temp 98.3 F (36.8 C)   Resp 17   SpO2 100%   Physical Exam Vitals and nursing note reviewed.  Constitutional:      General: He is not in acute distress.    Appearance: He is well-developed.  HENT:     Head: Normocephalic and atraumatic.     Right Ear: External ear normal.     Left Ear: External ear normal.     Mouth/Throat:     Mouth: Mucous membranes are moist.  Eyes:     General: No scleral icterus. Cardiovascular:     Rate and Rhythm: Normal rate and regular rhythm.     Pulses: Normal pulses.     Heart sounds: Normal heart sounds.  Pulmonary:     Effort: Pulmonary effort is normal. No  respiratory distress.     Breath sounds: Normal breath sounds.  Abdominal:     General: Abdomen is flat.     Palpations: Abdomen is soft.     Tenderness: There is no abdominal tenderness.  Musculoskeletal:        General: Normal range of motion.       Arms:     Cervical back: Normal range of motion.     Right lower leg: No edema.     Left lower leg: No edema.     Comments: No midline spinous process tenderness to palpation or percussion, no crepitus or step-off, rectal  tone is intact  Skin:    General: Skin is warm and dry.     Capillary Refill: Capillary refill takes less than 2 seconds.  Neurological:     Mental Status: He is alert and oriented to person, place, and time.     GCS: GCS eye subscore is 4. GCS verbal subscore is 5. GCS motor subscore is 6.     Cranial Nerves: Cranial nerves 2-12 are intact. No facial asymmetry.     Sensory: Sensation is intact.     Motor: Motor function is intact. No tremor.     Coordination: Coordination is intact.  Psychiatric:        Mood and Affect: Mood normal.        Behavior: Behavior normal.    ED Results / Procedures / Treatments   Labs (all labs ordered are listed, but only abnormal results are displayed) Labs Reviewed  COMPREHENSIVE METABOLIC PANEL - Abnormal; Notable for the following components:      Result Value   CO2 17 (*)    Glucose, Bld 106 (*)    BUN 79 (*)    Creatinine, Ser 5.18 (*)    Calcium 8.2 (*)    Total Protein 6.2 (*)    Albumin 3.2 (*)    AST 12 (*)    GFR, Estimated 10 (*)    All other components within normal limits  CBC WITH DIFFERENTIAL/PLATELET - Abnormal; Notable for the following components:   WBC 12.5 (*)    RBC 3.10 (*)    Hemoglobin 9.8 (*)    HCT 29.4 (*)    Platelets 123 (*)    Neutro Abs 10.0 (*)    Monocytes Absolute 1.3 (*)    Abs Immature Granulocytes 0.13 (*)    All other components within normal limits  I-STAT CHEM 8, ED - Abnormal; Notable for the following components:   BUN 87  (*)    Creatinine, Ser 5.80 (*)    Glucose, Bld 106 (*)    TCO2 18 (*)    Hemoglobin 10.2 (*)    HCT 30.0 (*)    All other components within normal limits  URINE CULTURE  MAGNESIUM  URINALYSIS, ROUTINE W REFLEX MICROSCOPIC    EKG None  Radiology No results found.  Procedures Procedures   Medications Ordered in ED Medications  sodium chloride 0.9 % bolus 1,000 mL (1,000 mLs Intravenous New Bag/Given 09/27/21 1638)    ED Course  I have reviewed the triage vital signs and the nursing notes.  Pertinent labs & imaging results that were available during my care of the patient were reviewed by me and considered in my medical decision making (see chart for details).    MDM Rules/Calculators/A&P                           CC: Urinary retention  This patient complains of above; this involves an extensive number of treatment options and is a complaint that carries with it a high risk of complications and morbidity. Vital signs were reviewed. Serious etiologies considered.  Record review:   Previous records obtained and reviewed   Additional history obtained from daughter at bedside  Work up as above, notable for:  Labs & imaging results that were available during my care of the patient were reviewed by me and considered in my medical decision making.   Renal function mildly worsened from his baseline.  Management: Bladder scan with around 45-55 ml  residual, pt will difficulty urinating will order foley catheter. Obtain UA sample. He has no neurological changes a/w his urinary retention, no focal deficit, saddle paresthesias, no back pain, rectal tone is intact.   Will obtain CTAP to evaluate for abnormality. This is pending at time of hand off.    Pt signed out to incoming physician pending CT and foley/ua. If pt has output from foley catheter that is indicative of significant obstruction would favor discharge with follow up with urology if no evidence of post obstruction  diuresis.         This chart was dictated using voice recognition software.  Despite best efforts to proofread,  errors can occur which can change the documentation meaning.  Final Clinical Impression(s) / ED Diagnoses Final diagnoses:  Urinary retention  Chronic kidney disease, unspecified CKD stage    Rx / DC Orders ED Discharge Orders     None        Jeanell Sparrow, DO 09/27/21 1655

## 2021-09-29 LAB — URINE CULTURE: Culture: >=100000 — AB

## 2021-10-16 ENCOUNTER — Other Ambulatory Visit: Payer: Self-pay | Admitting: Cardiovascular Disease

## 2021-10-17 NOTE — Progress Notes (Signed)
Cardiology Clinic Note   Patient Name: Pedro Young Date of Encounter: 10/18/2021  Primary Care Provider:  Gregor Hams, FNP Primary Cardiologist:  Evalina Field, MD  Patient Profile    Pedro Young 87 year old male presents to the clinic today for evaluation of his chest pressure.  Past Medical History    Past Medical History:  Diagnosis Date   Cancer ([place])    Hypertension    Renal disorder    CRF   Past Surgical History:  Procedure Laterality Date   AV FISTULA PLACEMENT Left 05/04/2019   Procedure: Arteriovenous (Av) Fistula Creation Left Arm;  Surgeon: Rosetta Posner, MD;  Location: [facility];  Service: Vascular;  Laterality: Left;   BIOPSY  08/10/2019   Procedure: BIOPSY;  Surgeon: Jackquline Denmark, MD;  Location: [facility];  Service: Endoscopy;;   CHOLECYSTECTOMY     COLONOSCOPY N/A 08/10/2019   Procedure: COLONOSCOPY;  Surgeon: Jackquline Denmark, MD;  Location: [facility];  Service: Endoscopy;  Laterality: N/A;   EYE SURGERY Bilateral    cataract   LEFT HEART CATH AND CORONARY ANGIOGRAPHY N/A 06/19/2020   Procedure: LEFT HEART CATH AND CORONARY ANGIOGRAPHY;  Surgeon: Belva Crome, MD;  Location: [facility];  Service: Cardiovascular;  Laterality: N/A;   POLYPECTOMY  08/10/2019   Procedure: POLYPECTOMY;  Surgeon: Jackquline Denmark, MD;  Location: [facility];  Service: Endoscopy;;    Allergies  Allergies  Allergen Reactions   Amoxicillin-Pot Clavulanate Other (See Comments)    Did it involve swelling of the face/tongue/throat, SOB, or low BP? Unknown Did it involve sudden or severe rash/hives, skin peeling, or any reaction on the inside of your mouth or nose? Unknown Did you need to seek medical attention at a hospital or doctor's office? Unknown When did it last happen? patient is unfamiliar with this allergy   If all above answers are NO, may proceed with cephalosporin use.    Atorvastatin     REACTION: arthralgia   Lovastatin     REACTION:  arthralgia    History of Present Illness    Pedro Young has a PMH of essential hypertension, angina, gastritis, acute pancreatitis, colitis, AKI, HLD, hematuria, and generalized abdominal pain.  He underwent cardiac catheterization 2021 which showed CTO of his distal RCA, 70% OM1 and OM 2 medical management was recommended.  He was seen by Dr. Audie Box on 01/30/2021.  On follow-up post cath he was doing well.  He reported that he did note some chest pressure with very heavy exertion such as shoveling snow.  He reported he was physically active with walking.  He reported another episode of chest pain that woke him out of his sleep.  The symptoms resolved with 1 sublingual nitroglycerin.  The continuation of medical management was discussed and recommended.  It was felt that if he ended up on hemodialysis he could pursue PCI to his RCA and OM branches.  His LDL was 58.  His blood pressure was 132/51.  He reported elevated blood pressures at home.  He was placed on metoprolol titrate 50 mg daily twice daily.  Follow-up was planned for 6 months.  Was seen in the emergency department 09/27/2021 with urinary retention.  A UA drawn at the time showed UTI with WBC count greater than 50, large leukocytes and many bacteria.  He noted urinary retention with dribbling and incontinence.  A Foley catheter was placed.  He was directed to follow-up with urology for removal of his catheter and he was  placed on antibiotics.  His Keflex was later adjusted due to his renal impairment.  He was instructed to take 250 mg daily for 7 days.  He presents to the clinic today for evaluation and states he has had intermittent episodes of chest discomfort with increased physical activity.  He reports relief with rest and 1 sublingual nitroglycerin.  He is a poor historian but reports his symptoms has been present for the last month on and off.  We reviewed his previous angiography and recommendations.  He does have a left  forearm AV  fistula but has not started HD.  We also reviewed his recent emergency department visit.  He reports compliance with antibiotics for UTI.  I will increase his Imdur to 60 mg daily, give him a salty 6 diet sheet, order a CBC, fasting lipids and LFTs, and plan follow-up for 1 month.  Today he denies chest pain, shortness of breath, lower extremity edema, fatigue, palpitations, melena, hematuria, hemoptysis, diaphoresis, weakness, presyncope, syncope, orthopnea, and PND.   Home Medications    Prior to Admission medications   Medication Sig Start Date End Date Taking? Authorizing Provider  amLODipine (NORVASC) 10 MG tablet TAKE 1 TABLET BY MOUTH EVERY DAY Patient taking differently: Take 10 mg by mouth daily. 07/16/21   O'NealCassie Freer, MD  aspirin EC 81 MG tablet Take 1 tablet (81 mg total) by mouth daily. Swallow whole. 08/01/21   O'NealCassie Freer, MD  cephALEXin (KEFLEX) 250 MG capsule Take 1 capsule (250 mg total) by mouth daily. 09/27/21   Blanchie Dessert, MD  diclofenac Sodium (VOLTAREN) 1 % GEL Apply 2 g topically 4 (four) times daily as needed (joint pain). 08/31/21   [provider]  furosemide (LASIX) 40 MG tablet Take 40 mg by mouth daily. Prescribed by Dr Lorrene Reid [facility]) since May 2016    [provider]  isosorbide mononitrate (IMDUR) 30 MG 24 hr tablet TAKE 1 TABLET BY MOUTH EVERY DAY Patient taking differently: Take 30 mg by mouth daily. 09/23/21   O'NealCassie Freer, MD  metoprolol tartrate (LOPRESSOR) 50 MG tablet Take 1 tablet (50 mg total) by mouth 2 (two) times daily. 08/01/21 10/30/21  Geralynn Rile, MD  nitroGLYCERIN (NITROSTAT) 0.4 MG SL tablet PLACE 1 TABLET UNDER THE TONGUE EVERY 5 MINUTES AS NEEDED FOR CHEST PAIN 10/16/21   O'Neal, Cassie Freer, MD  rosuvastatin (CRESTOR) 40 MG tablet TAKE 1 TABLET BY MOUTH EVERY DAY Patient taking differently: Take 40 mg by mouth daily. 08/23/21   O'NealCassie Freer, MD  tamsulosin  (FLOMAX) 0.4 MG CAPS capsule Take 0.4 mg by mouth daily. 06/14/20   [provider]    Family History    Family History  Problem Relation Age of Onset   Liver disease Father    Kidney disease Father    Heart disease Father        before age 74   Heart disease Mother        before age 22   He indicated that the status of his mother is unknown. He indicated that the status of his father is unknown.  Social History    Social History   Socioeconomic History   Marital status: Married    Spouse name: Not on file   Number of children: Not on file   Years of education: Not on file   Highest education level: Not on file  Occupational History   Not on file  Tobacco Use   Smoking  status: Former    Types: Cigarettes    Quit date: 1956    Years since quitting: 67.0   Smokeless tobacco: Former    Quit date: 11/03/1954  Vaping Use   Vaping Use: Never used  Substance and Sexual Activity   Alcohol use: No    Alcohol/week: 0.0 standard drinks   Drug use: No   Sexual activity: Not on file  Other Topics Concern   Not on file  Social History Narrative   From [place]   Moved to [place] to [place] 2006   5 daughters   Social Determinants of Radio broadcast assistant Strain: Not on file  Food Insecurity: Not on file  Transportation Needs: Not on file  Physical Activity: Not on file  Stress: Not on file  Social Connections: Not on file  Intimate Partner Violence: Not on file     Review of Systems    General:  No chills, fever, night sweats or weight changes.  Cardiovascular:  No chest pain, dyspnea on exertion, edema, orthopnea, palpitations, paroxysmal nocturnal dyspnea. Dermatological: No rash, lesions/masses Respiratory: No cough, dyspnea Urologic: No hematuria, dysuria Abdominal:   No nausea, vomiting, diarrhea, bright red blood per rectum, melena, or hematemesis Neurologic:  No visual changes, wkns, changes in mental status. All other systems reviewed  and are otherwise negative except as noted above.  Physical Exam    VS:  BP (!) 130/52    Pulse (!) 54    Ht 5\' 7"  (1.702 m)    Wt 115 lb 9.6 oz (52.4 kg)    SpO2 98%    BMI 18.11 kg/m  , BMI Body mass index is 18.11 kg/m. GEN: Well nourished, well developed, in no acute distress. HEENT: normal. Neck: Supple, no JVD, carotid bruits, or masses. Cardiac: RRR, no murmurs, rubs, or gallops. No clubbing, cyanosis, edema.  Radials/DP/PT 2+ and equal bilaterally.  Left forearm AV fistula + thrill Respiratory:  Respirations regular and unlabored, clear to auscultation bilaterally. GI: Soft, nontender, nondistended, BS + x 4. MS: no deformity or atrophy. Skin: warm and dry, no rash. Neuro:  Strength and sensation are intact. Psych: Normal affect.  Accessory Clinical Findings    Recent Labs: 09/27/2021: ALT 10; BUN 87; Creatinine, Ser 5.80; Hemoglobin 10.2; Magnesium 1.9; Platelets 123; Potassium 4.3; Sodium 139   Recent Lipid Panel    Component Value Date/Time   CHOL 126 08/29/2020 0921   TRIG 125 08/29/2020 0921   HDL 46 08/29/2020 0921   CHOLHDL 2.7 08/29/2020 0921   CHOLHDL 5.3 08/02/2015 1705   VLDL 26 08/02/2015 1705   LDLCALC 58 08/29/2020 0921    ECG personally reviewed by me today-sinus bradycardia 54 bpm no ST or T wave deviation- No acute changes  Echocardiogram 08/01/2020 IMPRESSIONS     1. Left ventricular ejection fraction by 3D volume is 66 %. The left  ventricle has normal function. The left ventricle demonstrates regional  wall motion abnormalities (see scoring diagram/findings for description).  There is mild left ventricular  hypertrophy. Left ventricular diastolic parameters are consistent with  Grade I diastolic dysfunction (impaired relaxation). The average left  ventricular global longitudinal strain is -19.7 %. The global longitudinal  strain is normal.   2. Right ventricular systolic function is normal. The right ventricular  size is normal. There is  normal pulmonary artery systolic pressure. The  estimated right ventricular systolic pressure is 25.4 mmHg.   3. Left atrial size was mildly dilated.  4. The mitral valve is grossly normal. Mild mitral valve regurgitation.  No evidence of mitral stenosis.   5. Tricuspid valve regurgitation is moderate.   6. The aortic valve is grossly normal. There is mild calcification of the  aortic valve. There is mild thickening of the aortic valve. Aortic valve  regurgitation is trivial. No aortic stenosis is present.   7. The inferior vena cava is dilated in size with >50% respiratory  variability, suggesting right atrial pressure of 8 mmHg.  Cardiac catheterization 06/19/2020  Dominant right coronary which is totally occluded distally.  The distal RCA fills by left to right collaterals from both LAD and circumflex territory. Patent left main Luminal irregularities in the LAD but no significant obstructive disease. Large first obtuse marginal contains 70 to 80% stenosis in the mid body.  The continuation of the circumflex beyond a small second obtuse marginal is 70 to 80% narrowed. LVEDP is 9 mmHg.  Left ventriculography not performed.   RECOMMENDATIONS:   Outpatient status. Spoke with Dr. Pearson Grippe, [facility], who will take responsibility for follow-up creatinine in 3 days (at his recommendation), and will arrange it to be performed at his office. Spoke with Dr. Eleonore Chiquito who will follow up with the patient in 1 week.  We previously discussed performing a basic metabolic panel in 48 hours, but after speaking with Dr. Joelyn Oms, the follow-up blood work will be by [facility]. Increase medical therapy intensity.  If continued angina, CTO on RCA and possibly stenting the first obtuse marginal and distal circumflex if warranted by symptoms after the patient is on dialysis. Total contrast 21 cc. Diagnostic Dominance: Right Intervention  Assessment & Plan   1.   Chest pressure-reports intermittent periods of chest discomfort with increased activity over the last month.  This pain has been relieved with sublingual nitroglycerin x1.    Underwent cardiac catheterization 06/19/2020.  Not felt to be a good candidate for PCI due to CKD.  Recommended reevaluation for ischemic evaluation/PCI when placed on HD.  Appears to be stable angina. Continue amlodipine, aspirin, metoprolol, nitroglycerin, rosuvastatin Increase Imdur to 60 mg daily Heart healthy low-sodium diet-salty 6 given Order CBC  Hyperlipidemia-LDL 58 on 08/29/2020 Continue aspirin, rosuvastatin Heart healthy low-sodium high-fiber diet Increase physical activity as tolerated Repeat lipids in LFTs  Essential hypertension-BP today 130/52.  Well-controlled at home. Continue Imdur, metoprolol Heart healthy low-sodium diet-salty 6 given Increase physical activity as tolerated  Chronic renal insufficiency-creatinine 5.8 on 09/27/2021 Follows with nephrology   Disposition: Follow-up with Dr. Audie Box or me in 1-2 months.  Jossie Ng. Tishina Lown NP-C    10/18/2021, 7:47 AM [address] Office [phone] Fax [phone]  Notice: This dictation was prepared with Dragon dictation along with smaller phrase technology. Any transcriptional errors that result from this process are unintentional and may not be corrected upon review.  I spent 13 minutes examining this patient, reviewing medications, and using patient centered shared decision making involving her cardiac care.  Prior to her visit I spent greater than 20 minutes reviewing her past medical history,  medications, and prior cardiac tests.

## 2021-10-18 ENCOUNTER — Other Ambulatory Visit: Payer: Self-pay

## 2021-10-18 ENCOUNTER — Encounter: Payer: Self-pay | Admitting: General Practice

## 2021-10-18 ENCOUNTER — Ambulatory Visit (INDEPENDENT_AMBULATORY_CARE_PROVIDER_SITE_OTHER): Payer: Medicare HMO | Admitting: General Practice

## 2021-10-18 VITALS — BP 130/52 | HR 54 | Ht 67.0 in | Wt 115.6 lb

## 2021-10-18 DIAGNOSIS — D631 Anemia in chronic kidney disease: Secondary | ICD-10-CM

## 2021-10-18 DIAGNOSIS — N184 Chronic kidney disease, stage 4 (severe): Secondary | ICD-10-CM

## 2021-10-18 DIAGNOSIS — I10 Essential (primary) hypertension: Secondary | ICD-10-CM

## 2021-10-18 DIAGNOSIS — I25118 Atherosclerotic heart disease of native coronary artery with other forms of angina pectoris: Secondary | ICD-10-CM | POA: Diagnosis not present

## 2021-10-18 DIAGNOSIS — R0789 Other chest pain: Secondary | ICD-10-CM | POA: Diagnosis not present

## 2021-10-18 DIAGNOSIS — Z79899 Other long term (current) drug therapy: Secondary | ICD-10-CM

## 2021-10-18 DIAGNOSIS — E782 Mixed hyperlipidemia: Secondary | ICD-10-CM

## 2021-10-18 LAB — CBC
Hematocrit: 30.6 % — ABNORMAL LOW (ref 37.5–51.0)
Hemoglobin: 9.8 g/dL — ABNORMAL LOW (ref 13.0–17.7)
MCH: 30.3 pg (ref 26.6–33.0)
MCHC: 32 g/dL (ref 31.5–35.7)
MCV: 95 fL (ref 79–97)
Platelets: 182 10*3/uL (ref 150–450)
RBC: 3.23 x10E6/uL — ABNORMAL LOW (ref 4.14–5.80)
RDW: 13.2 % (ref 11.6–15.4)
WBC: 6.6 10*3/uL (ref 3.4–10.8)

## 2021-10-18 LAB — HEPATIC FUNCTION PANEL
ALT: 10 IU/L (ref 0–44)
AST: 9 IU/L (ref 0–40)
Albumin: 3.8 g/dL (ref 3.6–4.6)
Alkaline Phosphatase: 90 IU/L (ref 44–121)
Bilirubin Total: 0.3 mg/dL (ref 0.0–1.2)
Bilirubin, Direct: 0.1 mg/dL (ref 0.00–0.40)
Total Protein: 6.5 g/dL (ref 6.0–8.5)

## 2021-10-18 LAB — LIPID PANEL
Chol/HDL Ratio: 3.3 ratio (ref 0.0–5.0)
Cholesterol, Total: 125 mg/dL (ref 100–199)
HDL: 38 mg/dL — ABNORMAL LOW (ref >39)
LDL Chol Calc (NIH): 65 mg/dL (ref 0–99)
Triglycerides: 120 mg/dL (ref 0–149)
VLDL Cholesterol Cal: 22 mg/dL (ref 5–40)

## 2021-10-18 MED ORDER — ISOSORBIDE MONONITRATE ER 30 MG PO TB24: 60.0000 mg | ORAL_TABLET | Freq: Every day | ORAL | 3 refills | Status: DC

## 2021-10-18 NOTE — Patient Instructions (Signed)
Medication Instructions:  INCREASE ISOSORBIDE 60MG  DAILY, YOU MAY TAKE 2 OF WHAT YOU HAVE *If you need a refill on your cardiac medications before your next appointment, please call your pharmacy*  Lab Work: FASTING LIPID, LFT,CBC TODAY If you have labs (blood work) drawn today and your tests are completely normal, you will receive your results only by:  MyChart Message (if you have MyChart) OR A paper copy in the mail.  If you have any lab test that is abnormal or we need to change your treatment, we will call you to review the results. You may go to any [facility] that is convenient for you however, we do have a lab in our office that is able to assist you. You DO NOT need an appointment for our lab. The lab is open 8:00am and closes at 4:00pm. Lunch 12:45 - 1:45pm.  Special Instructions Please try to avoid these triggers: Do not use any products that have nicotine or tobacco in them. These include cigarettes, e-cigarettes, and chewing tobacco. If you need help quitting, ask your doctor. Eat heart-healthy foods. Talk with your doctor about the right eating plan for you. Exercise regularly as told by your doctor. Stay hydrated Do not drink alcohol, Caffeine or chocolate. Lose weight if you are overweight. Do not use drugs, including cannabis   PLEASE READ AND FOLLOW SALTY 6-ATTACHED-1,800 mg daily  MAKE SURE TO STAY HYDRATED  Follow-Up: Your next appointment:  1-2 month(s) In Person with Evalina Field, MD  or Coletta Memos, FNP      :1  At [facility], you and your health needs are our priority.  As part of our continuing mission to provide you with exceptional heart care, we have created designated Provider Care Teams.  These Care Teams include your primary Cardiologist (physician) and Advanced Practice Providers (APPs -  Physician Assistants and Nurse Practitioners) who all work together to provide you with the care you need, when you need it.  We recommend signing up for the patient  portal called "MyChart".  Sign up information is provided on this After Visit Summary.  MyChart is used to connect with patients for Virtual Visits (Telemedicine).  Patients are able to view lab/test results, encounter notes, upcoming appointments, etc.  Non-urgent messages can be sent to your provider as well.   To learn more about what you can do with MyChart, go to [email or web address].

## 2021-11-08 NOTE — Progress Notes (Signed)
Cardiology Office Note:   Date:  11/11/2021  NAME:  Pedro Young    MRN: [id number] DOB:  1933/06/03   PCP:  Gregor Hams, FNP  Cardiologist:  Evalina Field, MD  Electrophysiologist:  None   Referring MD: Gregor Hams, FNP   Chief Complaint  Patient presents with   Follow-up        History of Present Illness:   Pedro Young is a 88 y.o. male with a hx of CAD, CKD stage V, hypertension, lipidemia who presents for follow-up.  Has severe CAD that is managed medically.  Symptoms have been stable.  Evaluated on 10/18/2021 by Coletta Memos.  Was having chest pain symptoms.  Imdur increased to 60 mg daily.  He reports he is having no further chest pain symptoms.  Blood pressure 122/64.  Denies any chest pain or trouble breathing.  He is walking up to 1/2 mile without limitations.  Cardiovascular examination is normal.  Overall doing quite well.  Problem List 1. CKD V/ESRD 2. HTN 3. Iron deficiency 4. CAD -LHC 06/19/2020 -100% occluded distal RCA with L to R collaterals  -70% OM1 -70% OM2 5. HLD -Total cholesterol 126, HDL 46, LDL 58, triglycerides 125  Past Medical History: Past Medical History:  Diagnosis Date   Cancer ([place])    Hypertension    Renal disorder    CRF    Past Surgical History: Past Surgical History:  Procedure Laterality Date   AV FISTULA PLACEMENT Left 05/04/2019   Procedure: Arteriovenous (Av) Fistula Creation Left Arm;  Surgeon: Rosetta Posner, MD;  Location: [facility];  Service: Vascular;  Laterality: Left;   BIOPSY  08/10/2019   Procedure: BIOPSY;  Surgeon: Jackquline Denmark, MD;  Location: [facility];  Service: Endoscopy;;   CHOLECYSTECTOMY     COLONOSCOPY N/A 08/10/2019   Procedure: COLONOSCOPY;  Surgeon: Jackquline Denmark, MD;  Location: [facility];  Service: Endoscopy;  Laterality: N/A;   EYE SURGERY Bilateral    cataract   LEFT HEART CATH AND CORONARY ANGIOGRAPHY N/A 06/19/2020   Procedure: LEFT HEART CATH AND CORONARY ANGIOGRAPHY;  Surgeon:  Belva Crome, MD;  Location: [facility];  Service: Cardiovascular;  Laterality: N/A;   POLYPECTOMY  08/10/2019   Procedure: POLYPECTOMY;  Surgeon: Jackquline Denmark, MD;  Location: [facility];  Service: Endoscopy;;    Current Medications: Current Meds  Medication Sig   amLODipine (NORVASC) 10 MG tablet TAKE 1 TABLET BY MOUTH EVERY DAY (Patient taking differently: Take 10 mg by mouth daily.)   aspirin EC 81 MG tablet Take 1 tablet (81 mg total) by mouth daily. Swallow whole.   cephALEXin (KEFLEX) 250 MG capsule Take 1 capsule (250 mg total) by mouth daily.   furosemide (LASIX) 40 MG tablet Take 40 mg by mouth daily. Prescribed by Dr Lorrene Reid [facility]) since May 2016   isosorbide mononitrate (IMDUR) 30 MG 24 hr tablet Take 2 tablets (60 mg total) by mouth daily.   metoprolol tartrate (LOPRESSOR) 50 MG tablet Take 1 tablet (50 mg total) by mouth 2 (two) times daily.   mirtazapine (REMERON) 7.5 MG tablet Take 7.5 mg by mouth at bedtime.   MYRBETRIQ 25 MG TB24 tablet Take 25 mg by mouth daily.   nitroGLYCERIN (NITROSTAT) 0.4 MG SL tablet PLACE 1 TABLET UNDER THE TONGUE EVERY 5 MINUTES AS NEEDED FOR CHEST PAIN   rosuvastatin (CRESTOR) 40 MG tablet TAKE 1 TABLET BY MOUTH EVERY DAY (Patient taking differently: Take 40 mg by mouth daily.)  tamsulosin (FLOMAX) 0.4 MG CAPS capsule Take 0.4 mg by mouth daily.     Allergies:    Amoxicillin-pot clavulanate, Atorvastatin, and Lovastatin   Social History: Social History   Socioeconomic History   Marital status: Married    Spouse name: Not on file   Number of children: Not on file   Years of education: Not on file   Highest education level: Not on file  Occupational History   Not on file  Tobacco Use   Smoking status: Former    Types: Cigarettes    Quit date: 1956    Years since quitting: 67.0   Smokeless tobacco: Former    Quit date: 11/03/1954  Vaping Use   Vaping Use: Never used  Substance and Sexual Activity   Alcohol use:  No    Alcohol/week: 0.0 standard drinks   Drug use: No   Sexual activity: Not on file  Other Topics Concern   Not on file  Social History Narrative   From [place]   Moved to [place] to [place] 2006   5 daughters   Social Determinants of Radio broadcast assistant Strain: Not on file  Food Insecurity: Not on file  Transportation Needs: Not on file  Physical Activity: Not on file  Stress: Not on file  Social Connections: Not on file     Family History: The patient's family history includes Heart disease in his father and mother; Kidney disease in his father; Liver disease in his father.  ROS:   All other ROS reviewed and negative. Pertinent positives noted in the HPI.     EKGs/Labs/Other Studies Reviewed:   The following studies were personally reviewed by me today:   TTE 08/01/2020 1. Left ventricular ejection fraction by 3D volume is 66 %. The left  ventricle has normal function. The left ventricle demonstrates regional  wall motion abnormalities (see scoring diagram/findings for description).  There is mild left ventricular  hypertrophy. Left ventricular diastolic parameters are consistent with  Grade I diastolic dysfunction (impaired relaxation). The average left  ventricular global longitudinal strain is -19.7 %. The global longitudinal  strain is normal.   2. Right ventricular systolic function is normal. The right ventricular  size is normal. There is normal pulmonary artery systolic pressure. The  estimated right ventricular systolic pressure is 17.4 mmHg.   3. Left atrial size was mildly dilated.   4. The mitral valve is grossly normal. Mild mitral valve regurgitation.  No evidence of mitral stenosis.   5. Tricuspid valve regurgitation is moderate.   6. The aortic valve is grossly normal. There is mild calcification of the  aortic valve. There is mild thickening of the aortic valve. Aortic valve  regurgitation is trivial. No aortic stenosis is present.    7. The inferior vena cava is dilated in size with >50% respiratory  variability, suggesting right atrial pressure of 8 mmHg.   LHC 06/19/2020 Dominant right coronary which is totally occluded distally.  The distal RCA fills by left to right collaterals from both LAD and circumflex territory. Patent left main Luminal irregularities in the LAD but no significant obstructive disease. Large first obtuse marginal contains 70 to 80% stenosis in the mid body.  The continuation of the circumflex beyond a small second obtuse marginal is 70 to 80% narrowed. LVEDP is 9 mmHg.  Left ventriculography not performed.  Recent Labs: 09/27/2021: BUN 87; Creatinine, Ser 5.80; Magnesium 1.9; Potassium 4.3; Sodium 139 10/18/2021: ALT 10; Hemoglobin  9.8; Platelets 182   Recent Lipid Panel    Component Value Date/Time   CHOL 125 10/18/2021 0808   TRIG 120 10/18/2021 0808   HDL 38 (L) 10/18/2021 0808   CHOLHDL 3.3 10/18/2021 0808   CHOLHDL 5.3 08/02/2015 1705   VLDL 26 08/02/2015 1705   LDLCALC 65 10/18/2021 0808    Physical Exam:   VS:  BP 122/64    Pulse (!) 56    Ht 5\' 7"  (1.702 m)    Wt 115 lb 9.6 oz (52.4 kg)    SpO2 99%    BMI 18.11 kg/m    Wt Readings from Last 3 Encounters:  11/11/21 115 lb 9.6 oz (52.4 kg)  10/18/21 115 lb 9.6 oz (52.4 kg)  08/01/21 119 lb (54 kg)    General: Well nourished, well developed, in no acute distress Head: Atraumatic, normal size  Eyes: PEERLA, EOMI  Neck: Supple, no JVD Endocrine: No thryomegaly Cardiac: Normal S1, S2; RRR; no murmurs, rubs, or gallops Lungs: Clear to auscultation bilaterally, no wheezing, rhonchi or rales  Abd: Soft, nontender, no hepatomegaly  Ext: No edema, pulses 2+ Musculoskeletal: No deformities, BUE and BLE strength normal and equal Skin: Warm and dry, no rashes   Neuro: Alert and oriented to person, place, time, and situation, CNII-XII grossly intact, no focal deficits  Psych: Normal mood and affect   ASSESSMENT:   Pedro Young is a 88 y.o. male who presents for the following: 1. Coronary artery disease of native artery of native heart with stable angina pectoris ([place])   2. Mixed hyperlipidemia     PLAN:   1. Coronary artery disease of native artery of native heart with stable angina pectoris ([place]) 2. Mixed hyperlipidemia -Evaluated for symptoms of stable angina.  Left heart catheterization on 06/19/2020 demonstrated occluded distal RCA with left-to-right collaterals.  He has 70% disease in an OM 1 and OM 2 branch.  PCI was deferred due to CKD stage V not on dialysis. -He has done well with medical management.  Continues to to be without symptoms.  Imdur increased to 60 mg daily.  We will continue this.  He is doing well without symptoms of angina today. -He is on aspirin 81 mg daily. -He is on amlodipine 10 mg daily.  He is on metoprolol tartrate 50 mg twice daily. -He still remains not on dialysis.  Medical management is still recommended.  I believe none of the lesions merit PCI at this point.  He seems to be doing well.  Echocardiogram showed normal LV function. -He is on Crestor 40 mg daily.  Most recent LDL cholesterol is at goal. -He does have iron deficiency anemia.  Follow[facility].  Most recent hemoglobin 9.8.  Would aim for goal as above 8.  This will clearly help with his CAD. -Overall plan is to continue with medications.  She to become unstable he should warrant invasive angiography but this will likely result in him needing hemodialysis.  Given that he is not really want to be on hemodialysis and this is followed as an outpatient we will continue to closely monitor him.  Disposition: Return in about 6 months (around 05/11/2022).  Medication Adjustments/Labs and Tests Ordered: Current medicines are reviewed at length with the patient today.  Concerns regarding medicines are outlined above.  No orders of the defined types were placed in this encounter.  No orders of the defined  types were placed in this encounter.   Patient Instructions  Medication Instructions:  The current medical regimen is effective;  continue present plan and medications.  *If you need a refill on your cardiac medications before your next appointment, please call your pharmacy*   Follow-Up: At [facility], you and your health needs are our priority.  As part of our continuing mission to provide you with exceptional heart care, we have created designated Provider Care Teams.  These Care Teams include your primary Cardiologist (physician) and Advanced Practice Providers (APPs -  Physician Assistants and Nurse Practitioners) who all work together to provide you with the care you need, when you need it.  We recommend signing up for the patient portal called "MyChart".  Sign up information is provided on this After Visit Summary.  MyChart is used to connect with patients for Virtual Visits (Telemedicine).  Patients are able to view lab/test results, encounter notes, upcoming appointments, etc.  Non-urgent messages can be sent to your provider as well.   To learn more about what you can do with MyChart, go to [email or web address].    Your next appointment:   6 month(s)  The format for your next appointment:   In Person  Provider:   Evalina Field, MD       Time Spent with Patient: I have spent a total of 25 minutes with patient reviewing hospital notes, telemetry, EKGs, labs and examining the patient as well as establishing an assessment and plan that was discussed with the patient.  > 50% of time was spent in direct patient care.  Signed, Addison Naegeli. Audie Box, MD, [place]  [address] [place], [place] [address] [phone]  11/11/2021 10:37 AM

## 2021-11-11 ENCOUNTER — Ambulatory Visit (INDEPENDENT_AMBULATORY_CARE_PROVIDER_SITE_OTHER): Payer: Medicare HMO | Admitting: Cardiovascular Disease

## 2021-11-11 ENCOUNTER — Encounter: Payer: Self-pay | Admitting: Cardiovascular Disease

## 2021-11-11 ENCOUNTER — Other Ambulatory Visit: Payer: Self-pay

## 2021-11-11 VITALS — BP 122/64 | HR 56 | Ht 67.0 in | Wt 115.6 lb

## 2021-11-11 DIAGNOSIS — I25118 Atherosclerotic heart disease of native coronary artery with other forms of angina pectoris: Secondary | ICD-10-CM

## 2021-11-11 DIAGNOSIS — E782 Mixed hyperlipidemia: Secondary | ICD-10-CM | POA: Diagnosis not present

## 2021-11-11 NOTE — Patient Instructions (Signed)
Medication Instructions:  ?The current medical regimen is effective;  continue present plan and medications. ? ?*If you need a refill on your cardiac medications before your next appointment, please call your pharmacy* ? ? ?Follow-Up: ?At [facility], you and your health needs are our priority.  As part of our continuing mission to provide you with exceptional heart care, we have created designated Provider Care Teams.  These Care Teams include your primary Cardiologist (physician) and Advanced Practice Providers (APPs -  Physician Assistants and Nurse Practitioners) who all work together to provide you with the care you need, when you need it. ? ?We recommend signing up for the patient portal called "MyChart".  Sign up information is provided on this After Visit Summary.  MyChart is used to connect with patients for Virtual Visits (Telemedicine).  Patients are able to view lab/test results, encounter notes, upcoming appointments, etc.  Non-urgent messages can be sent to your provider as well.   ?To learn more about what you can do with MyChart, go to [email or web address].   ? ?Your next appointment:   ?6 month(s) ? ?The format for your next appointment:   ?In Person ? ?Provider:   ?[place] T O'Neal, MD { ? ? ? ? ? ? ? ?

## 2021-11-30 ENCOUNTER — Other Ambulatory Visit: Payer: Self-pay | Admitting: General Practice

## 2022-02-21 ENCOUNTER — Telehealth: Payer: Self-pay | Admitting: Gastroenterology

## 2022-02-21 NOTE — Telephone Encounter (Signed)
Received a call from Doran Clay, [phone], on behalf of patient.  Dr. Raymond Gurney is sending an urgent/emergency request for patient to see Dr. Lyndel Safe within the next week.  Patient is having large stool diarrhea (stating there is a lot of blood) and because of this he is anemic and has lost down to 114 pounds.  He needs to be seen sooner rather than later.  Is there anyway Dr. Lyndel Safe could see him within the next week?  Please call patient and advise. ? ?Also, if you could call Monique to let her know when/if he has been scheduled so she can tell her provider. ? ?Thank you. ?

## 2022-02-21 NOTE — Telephone Encounter (Signed)
Spoke to [place] from [employer]; Dr Raymond Gurney is requesting for pt to see Dr. Lyndel Safe next week: Dr. Lyndel Safe is out of the office next week: Monique made aware: pt was scheduled to see Vicie Mutters PA 02/24/2022 at 1:30: ?Monique stated that she is going to notify pt: Date/Time/ Address provided: ?Monique  verbalized understanding with all questions answered.  ? ? ?

## 2022-02-24 ENCOUNTER — Ambulatory Visit: Payer: Medicare HMO | Admitting: Physician Assistant

## 2022-02-24 ENCOUNTER — Telehealth: Payer: Self-pay | Admitting: Physician Assistant

## 2022-02-24 NOTE — Progress Notes (Deleted)
02/24/2022 Pedro Young [id number] [birth year]  Referring provider: Gregor Hams, FNP Primary GI doctor: Dr. Lyndel Safe  ASSESSMENT AND PLAN:   There are no diagnoses linked to this encounter.  ASSESSMENT: No diagnosis found.   PLAN:  No orders of the defined types were placed in this encounter.    History of Present Illness:  88 y.o. male  with a past medical history of hyperlipidemia, hypertension, BPH, ESRD has fistula but has not started on dialysis, history of cancer, and others listed below, returns to clinic today for evaluation of positive fit.  Previously seen by Dr. Lyndel Safe 2020 for bloody diarrhea, patient had colitis on CT, colonoscopy showed colitis, had negative stool studies for infection, more inflammatory diarrhea.  Patient did require iron infusions at that time started on budesonide for 8 weeks with a taper.  And had improvement.  suppose to have repeat colon with 2 day prep 3-6 months but since patient did well with medication, did not repeat..  08/10/2019 colonoscopy For colitis, weight loss showed fair prep,  acute colitis normal terminal ileum.  Colonic polyp status post polypectomy 3 polyps 4-6 mm in size, pan colonic diverticulosis. 09/27/2021 CT abdomen pelvis without contrast urinary retention showed decompressed urinary bladder with Foley in place, stable enlarged prostate chronic bladder outlet obstruction, diverticulosis without evidence of diverticulitis.  Tiny cyst on liver, prior cholecystectomy no biliary obstruction.  Normal pancreas.  Normal stomach.  No evidence of colitis.  Patient had found to be anemic with hemoglobin 9.8 on 1216/2022, MCV 95 slow decrease as a year ago 06/15/2020 was 11.9, 09/27/2021 9.8, 09/27/2021 10.2 Most recently patient had fit test positive with PCP. Creatinine 5.80 BUN 8711/25/2022 Urine did show moderate blood, large leukocytes and large amount of protein.  Patient follows with Dr. Davina Poke in cardiology, having  stable angina at this time, catheterization 06/19/2020, showed CTO of his distal RCA, 70% OM1 and OM 2 medical management was recommended.  Not a candidate for PCI secondary to end-stage renal disease  Current Medications:    Current Outpatient Medications (Cardiovascular):    amLODipine (NORVASC) 10 MG tablet, TAKE 1 TABLET BY MOUTH EVERY DAY (Patient taking differently: Take 10 mg by mouth daily.)   furosemide (LASIX) 40 MG tablet, Take 40 mg by mouth daily. Prescribed by Dr Lorrene Reid [facility]) since May 2016   isosorbide mononitrate (IMDUR) 30 MG 24 hr tablet, TAKE 2 TABLETS BY MOUTH DAILY   metoprolol tartrate (LOPRESSOR) 50 MG tablet, Take 1 tablet (50 mg total) by mouth 2 (two) times daily.   nitroGLYCERIN (NITROSTAT) 0.4 MG SL tablet, PLACE 1 TABLET UNDER THE TONGUE EVERY 5 MINUTES AS NEEDED FOR CHEST PAIN   rosuvastatin (CRESTOR) 40 MG tablet, TAKE 1 TABLET BY MOUTH EVERY DAY (Patient taking differently: Take 40 mg by mouth daily.)   Current Outpatient Medications (Analgesics):    aspirin EC 81 MG tablet, Take 1 tablet (81 mg total) by mouth daily. Swallow whole.   Current Outpatient Medications (Other):    cephALEXin (KEFLEX) 250 MG capsule, Take 1 capsule (250 mg total) by mouth daily.   diclofenac Sodium (VOLTAREN) 1 % GEL, Apply 2 g topically 4 (four) times daily as needed (joint pain). (Patient not taking: Reported on 11/11/2021)   mirtazapine (REMERON) 7.5 MG tablet, Take 7.5 mg by mouth at bedtime.   MYRBETRIQ 25 MG TB24 tablet, Take 25 mg by mouth daily.   tamsulosin (FLOMAX) 0.4 MG CAPS capsule, Take 0.4 mg by mouth daily.  Surgical History:  He  has a past surgical history that includes Cholecystectomy; Eye surgery (Bilateral); AV fistula placement (Left, 05/04/2019); Colonoscopy (N/A, 08/10/2019); biopsy (08/10/2019); polypectomy (08/10/2019); and LEFT HEART CATH AND CORONARY ANGIOGRAPHY (N/A, 06/19/2020). Family History:  His family history includes Heart disease in his  father and mother; Kidney disease in his father; Liver disease in his father. Social History:   reports that he quit smoking about 67 years ago. His smoking use included cigarettes. He quit smokeless tobacco use about 67 years ago. He reports that he does not drink alcohol and does not use drugs.  Current Medications, Allergies, Past Medical History, Past Surgical History, Family History and Social History were reviewed in [employer] record.  Physical Exam: There were no vitals taken for this visit. General:   Pleasant, well developed male in no acute distress Heart : Regular rate and rhythm; no murmurs Pulm: Clear anteriorly; no wheezing Abdomen:  {BlankSingle:19197::"Distended","Ridged","Soft"}, {BlankSingle:19197::"Flat","Obese","Non-distended"} AB, {BlankSingle:19197::"Absent","Hyperactive, tinkling","Hypoactive","Sluggish","Active"} bowel sounds. {actendernessAB:27319} tenderness {anatomy; site abdomen:5010}. {BlankMultiple:19196::"Without guarding","With guarding","Without rebound","With rebound"}, No organomegaly appreciated. Extremities:  {With/without:5700}  edema. Neurologic:  Alert and  oriented x4;  No focal deficits.  Psych:  Cooperative. Normal mood and affect.   Vladimir Crofts, PA-C 02/24/22

## 2022-02-24 NOTE — Telephone Encounter (Signed)
Good afternoon Estill Bamberg, ? ?Patient called to reschedule appointment that he missed with you today at 1:30. He stated that he was trying to find our office and could not find it. ? ?Patient was rescheduled for 5/11 at 3:00.  ?

## 2022-03-05 NOTE — Progress Notes (Deleted)
03/05/2022 Coden Franchi [id number] June 02, 1933  Referring provider: Gregor Hams, FNP Primary GI doctor: Dr. Lyndel Safe  ASSESSMENT AND PLAN:   There are no diagnoses linked to this encounter.   History of Present Illness:  88 y.o. male  with a past medical history of hyperlipidemia, hypertension, BPH, ESRD has fistula but has not started on dialysis, history of cancer, and others listed below, returns to clinic today for evaluation of positive fit.  Previously seen by Dr. Lyndel Safe 2020 for bloody diarrhea, patient had colitis on CT, colonoscopy showed colitis, had negative stool studies for infection, more inflammatory diarrhea.  Patient did require iron infusions at that time started on budesonide for 8 weeks with a taper.  And had improvement.  suppose to have repeat colon with 2 day prep 3-6 months but since patient did well with medication, did not repeat..  08/10/2019 colonoscopy For colitis, weight loss showed fair prep,  acute colitis normal terminal ileum.  Colonic polyp status post polypectomy 3 polyps 4-6 mm in size, pan colonic diverticulosis. 09/27/2021 CT abdomen pelvis without contrast urinary retention showed decompressed urinary bladder with Foley in place, stable enlarged prostate chronic bladder outlet obstruction, diverticulosis without evidence of diverticulitis.  Tiny cyst on liver, prior cholecystectomy no biliary obstruction.  Normal pancreas.  Normal stomach.  No evidence of colitis.  Patient had found to be anemic with hemoglobin 9.8 on 1216/2022, MCV 95 slow decrease as a year ago 06/15/2020 was 11.9, 09/27/2021 9.8, 09/27/2021 10.2 Most recently patient had fit test positive with PCP. Creatinine 5.80 BUN 8711/25/2022 Urine did show moderate blood, large leukocytes and large amount of protein.  Patient follows with Dr. Davina Poke in cardiology, having stable angina at this time, catheterization 06/19/2020, showed CTO of his distal RCA, 70% OM1 and OM 2 medical  management was recommended.  Not a candidate for PCI secondary to end-stage renal disease  Current Medications:    Current Outpatient Medications (Cardiovascular):    amLODipine (NORVASC) 10 MG tablet, TAKE 1 TABLET BY MOUTH EVERY DAY (Patient taking differently: Take 10 mg by mouth daily.)   furosemide (LASIX) 40 MG tablet, Take 40 mg by mouth daily. Prescribed by Dr Lorrene Reid [facility]) since May 2016   isosorbide mononitrate (IMDUR) 30 MG 24 hr tablet, TAKE 2 TABLETS BY MOUTH DAILY   metoprolol tartrate (LOPRESSOR) 50 MG tablet, Take 1 tablet (50 mg total) by mouth 2 (two) times daily.   nitroGLYCERIN (NITROSTAT) 0.4 MG SL tablet, PLACE 1 TABLET UNDER THE TONGUE EVERY 5 MINUTES AS NEEDED FOR CHEST PAIN   rosuvastatin (CRESTOR) 40 MG tablet, TAKE 1 TABLET BY MOUTH EVERY DAY (Patient taking differently: Take 40 mg by mouth daily.)   Current Outpatient Medications (Analgesics):    aspirin EC 81 MG tablet, Take 1 tablet (81 mg total) by mouth daily. Swallow whole.   Current Outpatient Medications (Other):    cephALEXin (KEFLEX) 250 MG capsule, Take 1 capsule (250 mg total) by mouth daily.   diclofenac Sodium (VOLTAREN) 1 % GEL, Apply 2 g topically 4 (four) times daily as needed (joint pain). (Patient not taking: Reported on 11/11/2021)   mirtazapine (REMERON) 7.5 MG tablet, Take 7.5 mg by mouth at bedtime.   MYRBETRIQ 25 MG TB24 tablet, Take 25 mg by mouth daily.   tamsulosin (FLOMAX) 0.4 MG CAPS capsule, Take 0.4 mg by mouth daily.  Surgical History:  He  has a past surgical history that includes Cholecystectomy; Eye surgery (Bilateral); AV fistula placement (Left, 05/04/2019); Colonoscopy (N/A,  08/10/2019); biopsy (08/10/2019); polypectomy (08/10/2019); and LEFT HEART CATH AND CORONARY ANGIOGRAPHY (N/A, 06/19/2020). Family History:  His family history includes Heart disease in his father and mother; Kidney disease in his father; Liver disease in his father. Social History:   reports that  he quit smoking about 67 years ago. His smoking use included cigarettes. He quit smokeless tobacco use about 67 years ago. He reports that he does not drink alcohol and does not use drugs.  Current Medications, Allergies, Past Medical History, Past Surgical History, Family History and Social History were reviewed in [employer] record.  Physical Exam: There were no vitals taken for this visit. General:   Pleasant, well developed male in no acute distress Heart : Regular rate and rhythm; no murmurs Pulm: Clear anteriorly; no wheezing Abdomen:  {BlankSingle:19197::"Distended","Ridged","Soft"}, {BlankSingle:19197::"Flat","Obese","Non-distended"} AB, {BlankSingle:19197::"Absent","Hyperactive, tinkling","Hypoactive","Sluggish","Active"} bowel sounds. {actendernessAB:27319} tenderness {anatomy; site abdomen:5010}. {BlankMultiple:19196::"Without guarding","With guarding","Without rebound","With rebound"}, No organomegaly appreciated. Extremities:  {With/without:5700}  edema. Neurologic:  Alert and  oriented x4;  No focal deficits.  Psych:  Cooperative. Normal mood and affect.   Vladimir Crofts, PA-C 03/05/22

## 2022-03-13 ENCOUNTER — Ambulatory Visit: Payer: Medicare HMO | Admitting: Physician Assistant

## 2022-04-21 ENCOUNTER — Encounter (HOSPITAL_COMMUNITY): Payer: Self-pay | Admitting: Emergency Medicine

## 2022-04-21 ENCOUNTER — Emergency Department (HOSPITAL_COMMUNITY)
Admission: EM | Admit: 2022-04-21 | Discharge: 2022-04-21 | Disposition: A | Discharge status: Home / Self Care | Payer: Medicare HMO | Attending: Emergency Medicine | Admitting: Emergency Medicine

## 2022-04-21 ENCOUNTER — Emergency Department (HOSPITAL_COMMUNITY): Payer: Medicare HMO

## 2022-04-21 DIAGNOSIS — Z79899 Other long term (current) drug therapy: Secondary | ICD-10-CM | POA: Diagnosis not present

## 2022-04-21 DIAGNOSIS — R110 Nausea: Secondary | ICD-10-CM | POA: Diagnosis not present

## 2022-04-21 DIAGNOSIS — Z7982 Long term (current) use of aspirin: Secondary | ICD-10-CM | POA: Insufficient documentation

## 2022-04-21 DIAGNOSIS — R079 Chest pain, unspecified: Secondary | ICD-10-CM | POA: Diagnosis present

## 2022-04-21 LAB — BASIC METABOLIC PANEL
Anion gap: 9 (ref 5–15)
BUN: 85 mg/dL — ABNORMAL HIGH (ref 8–23)
CO2: 20 mmol/L — ABNORMAL LOW (ref 22–32)
Calcium: 8.8 mg/dL — ABNORMAL LOW (ref 8.9–10.3)
Chloride: 111 mmol/L (ref 98–111)
Creatinine, Ser: 4.2 mg/dL — ABNORMAL HIGH (ref 0.61–1.24)
GFR, Estimated: 13 mL/min — ABNORMAL LOW (ref >60)
Glucose, Bld: 125 mg/dL — ABNORMAL HIGH (ref 70–99)
Potassium: 4.9 mmol/L (ref 3.5–5.1)
Sodium: 140 mmol/L (ref 135–145)

## 2022-04-21 LAB — HEPATIC FUNCTION PANEL
ALT: 20 U/L (ref 0–44)
AST: 19 U/L (ref 15–41)
Albumin: 4 g/dL (ref 3.5–5.0)
Alkaline Phosphatase: 71 U/L (ref 38–126)
Bilirubin, Direct: 0.1 mg/dL (ref 0.0–0.2)
Indirect Bilirubin: 0.6 mg/dL (ref 0.3–0.9)
Total Bilirubin: 0.7 mg/dL (ref 0.3–1.2)
Total Protein: 7.6 g/dL (ref 6.5–8.1)

## 2022-04-21 LAB — CBC
HCT: 32.5 % — ABNORMAL LOW (ref 39.0–52.0)
Hemoglobin: 10.5 g/dL — ABNORMAL LOW (ref 13.0–17.0)
MCH: 31.4 pg (ref 26.0–34.0)
MCHC: 32.3 g/dL (ref 30.0–36.0)
MCV: 97.3 fL (ref 80.0–100.0)
Platelets: 120 10*3/uL — ABNORMAL LOW (ref 150–400)
RBC: 3.34 MIL/uL — ABNORMAL LOW (ref 4.22–5.81)
RDW: 12.8 % (ref 11.5–15.5)
WBC: 7.6 10*3/uL (ref 4.0–10.5)
nRBC: 0 % (ref 0.0–0.2)

## 2022-04-21 LAB — TROPONIN I (HIGH SENSITIVITY)
Troponin I (High Sensitivity): 27 ng/L — ABNORMAL HIGH (ref <18)
Troponin I (High Sensitivity): 32 ng/L — ABNORMAL HIGH (ref <18)

## 2022-04-21 LAB — LIPASE, BLOOD: Lipase: 50 U/L (ref 11–51)

## 2022-04-21 MED ORDER — ALUM & MAG HYDROXIDE-SIMETH 200-200-20 MG/5ML PO SUSP
30.0000 mL | Freq: Once | ORAL | Status: AC
  Administered 2022-04-21: 30 mL via ORAL
  Filled 2022-04-21: qty 30

## 2022-04-21 MED ORDER — ISOSORBIDE MONONITRATE ER 30 MG PO TB24: 90.0000 mg | ORAL_TABLET | Freq: Every day | ORAL | 6 refills | Status: DC

## 2022-04-21 NOTE — ED Provider Notes (Signed)
[place] [facility] DEPT Provider Note   CSN: [id number] Arrival date & time: 04/21/22  9798     History  Chief Complaint  Patient presents with   Chest Pain    Pedro Young is a 88 y.o. male.  88 yo M with a chief complaints of chest pain.  This started last night at about 8 PM.  Had been persistent but took a nitro this morning and had some nausea with it.  Pain is a bit worse with deep inspiration.  Denies anything else that seems to make it better or worse.  Denies cough congestion or fever denies trauma.  Denies abdominal pain.  Has never had pain like this before.  Patient denies history of MI, denies hyperlipidemia diabetes or smoking.  Denies family history of MI.   Patient denies history of PE or DVT denies hemoptysis denies unilateral lower extremity edema denies recent surgery immobilization hospitalization estrogen use or history of cancer.     Chest Pain      Home Medications Prior to Admission medications   Medication Sig Start Date End Date Taking? Authorizing Provider  amLODipine (NORVASC) 10 MG tablet TAKE 1 TABLET BY MOUTH EVERY DAY Patient taking differently: Take 10 mg by mouth daily. 07/16/21   O'NealCassie Freer, MD  aspirin EC 81 MG tablet Take 1 tablet (81 mg total) by mouth daily. Swallow whole. 08/01/21   O'NealCassie Freer, MD  cephALEXin (KEFLEX) 250 MG capsule Take 1 capsule (250 mg total) by mouth daily. 09/27/21   Blanchie Dessert, MD  diclofenac Sodium (VOLTAREN) 1 % GEL Apply 2 g topically 4 (four) times daily as needed (joint pain). Patient not taking: Reported on 11/11/2021 08/31/21   [provider]  furosemide (LASIX) 40 MG tablet Take 40 mg by mouth daily. Prescribed by Dr Lorrene Reid [facility]) since May 2016    [provider]  isosorbide mononitrate (IMDUR) 30 MG 24 hr tablet Take 3 tablets (90 mg total) by mouth daily. 04/21/22   Deno Etienne, DO  metoprolol tartrate (LOPRESSOR) 50 MG  tablet Take 1 tablet (50 mg total) by mouth 2 (two) times daily. 08/01/21 11/11/21  O'NealCassie Freer, MD  mirtazapine (REMERON) 7.5 MG tablet Take 7.5 mg by mouth at bedtime.    [provider]  MYRBETRIQ 25 MG TB24 tablet Take 25 mg by mouth daily. 11/05/21   [provider]  nitroGLYCERIN (NITROSTAT) 0.4 MG SL tablet PLACE 1 TABLET UNDER THE TONGUE EVERY 5 MINUTES AS NEEDED FOR CHEST PAIN 10/16/21   O'Neal, Cassie Freer, MD  rosuvastatin (CRESTOR) 40 MG tablet TAKE 1 TABLET BY MOUTH EVERY DAY Patient taking differently: Take 40 mg by mouth daily. 08/23/21   O'NealCassie Freer, MD  tamsulosin (FLOMAX) 0.4 MG CAPS capsule Take 0.4 mg by mouth daily. 06/14/20   [provider]      Allergies    Amoxicillin-pot clavulanate, Atorvastatin, and Lovastatin    Review of Systems   Review of Systems  Cardiovascular:  Positive for chest pain.    Physical Exam Updated Vital Signs BP (!) 183/85   Pulse 88   Temp 98.3 F (36.8 C) (Oral)   Resp 17   Ht '5\' 7"'$  (1.702 m)   Wt 47.6 kg   SpO2 99%   BMI 16.45 kg/m  Physical Exam Vitals and nursing note reviewed.  Constitutional:      Appearance: He is well-developed.  HENT:     Head: Normocephalic and atraumatic.  Eyes:  Pupils: Pupils are equal, round, and reactive to light.  Neck:     Vascular: No JVD.  Cardiovascular:     Rate and Rhythm: Normal rate and regular rhythm.     Heart sounds: No murmur heard.    No friction rub. No gallop.  Pulmonary:     Effort: No respiratory distress.     Breath sounds: No wheezing.  Abdominal:     General: There is no distension.     Tenderness: There is no abdominal tenderness. There is no guarding or rebound.  Musculoskeletal:        General: Normal range of motion.     Cervical back: Normal range of motion and neck supple.  Skin:    Coloration: Skin is not pale.     Findings: No rash.  Neurological:     Mental Status: He is alert and oriented to person, place,  and time.  Psychiatric:        Behavior: Behavior normal.     ED Results / Procedures / Treatments   Labs (all labs ordered are listed, but only abnormal results are displayed) Labs Reviewed  BASIC METABOLIC PANEL - Abnormal; Notable for the following components:      Result Value   CO2 20 (*)    Glucose, Bld 125 (*)    BUN 85 (*)    Creatinine, Ser 4.20 (*)    Calcium 8.8 (*)    GFR, Estimated 13 (*)    All other components within normal limits  CBC - Abnormal; Notable for the following components:   RBC 3.34 (*)    Hemoglobin 10.5 (*)    HCT 32.5 (*)    Platelets 120 (*)    All other components within normal limits  TROPONIN I (HIGH SENSITIVITY) - Abnormal; Notable for the following components:   Troponin I (High Sensitivity) 27 (*)    All other components within normal limits  TROPONIN I (HIGH SENSITIVITY) - Abnormal; Notable for the following components:   Troponin I (High Sensitivity) 32 (*)    All other components within normal limits  HEPATIC FUNCTION PANEL  LIPASE, BLOOD    EKG EKG Interpretation  Date/Time:  Monday April 21 2022 09:48:00 EDT Ventricular Rate:  97 PR Interval:  189 QRS Duration: 93 QT Interval:  337 QTC Calculation: 428 R Axis:   72 Text Interpretation: Sinus rhythm No significant change since last tracing Confirmed by Deno Etienne [phone]) on 04/21/2022 11:58:41 AM  Radiology DG Chest 2 View  Result Date: 04/21/2022 CLINICAL DATA:  Chest a Edison Pace since last night. No relief with nitroglycerin. Now with some nausea. EXAM: CHEST - 2 VIEW COMPARISON:  08/04/2015. FINDINGS: Cardiac silhouette is normal in size and configuration. Normal mediastinal and hilar contours. Lungs are clear.  No pleural effusion or pneumothorax. Skeletal structures are intact. IMPRESSION: No active cardiopulmonary disease. Electronically Signed   By: Lajean Manes M.D.   On: 04/21/2022 10:23    Procedures Procedures    Medications Ordered in ED Medications  alum & mag  hydroxide-simeth (MAALOX/MYLANTA) 200-200-20 MG/5ML suspension 30 mL (30 mLs Oral Given 04/21/22 1056)    ED Course/ Medical Decision Making/ A&P                           Medical Decision Making Amount and/or Complexity of Data Reviewed Labs: ordered. Radiology: ordered.  Risk OTC drugs. Prescription drug management.   88 yo M with a chief complaint of  chest pain.  This been going on for about 12 hours now.  Nonspecific by history.  His pain is completely relieved by my exam.  EKG without ischemic finding.  We will obtain a delta troponin and chest x-ray reassess.  Patient has 2 troponins that are mildly elevated.  Seem to be slightly increasing on repeat check.  I suspect likely they are slightly elevated due to his renal dysfunction.  Chest x-ray independently interpreted by me without focal infiltrate.  No significant electrolyte abnormality.  Patient continues to be pain-free.  No significant anemia.  I discussed the case with Dr. Marlou Porch, cardiology.  Based on my history and exam and his review of the patient's EKG and blood work he recommended increasing the patient's Imdur and will have him follow-up as an outpatient.  2:52 PM:  I have discussed the diagnosis/risks/treatment options with the patient.  Evaluation and diagnostic testing in the emergency department does not suggest an emergent condition requiring admission or immediate intervention beyond what has been performed at this time.  They will follow up with  PCP. We also discussed returning to the ED immediately if new or worsening sx occur. We discussed the sx which are most concerning (e.g., sudden worsening pain, fever, inability to tolerate by mouth) that necessitate immediate return. Medications administered to the patient during their visit and any new prescriptions provided to the patient are listed below.  Medications given during this visit Medications  alum & mag hydroxide-simeth (MAALOX/MYLANTA) 200-200-20 MG/5ML  suspension 30 mL (30 mLs Oral Given 04/21/22 1056)     The patient appears reasonably screen and/or stabilized for discharge and I doubt any other medical condition or other [facility] requiring further screening, evaluation, or treatment in the ED at this time prior to discharge.          Final Clinical Impression(s) / ED Diagnoses Final diagnoses:  Nonspecific chest pain    Rx / DC Orders ED Discharge Orders          Ordered    isosorbide mononitrate (IMDUR) 30 MG 24 hr tablet  Daily        04/21/22 [place], [place], DO 04/21/22 1452

## 2022-04-21 NOTE — ED Triage Notes (Signed)
Patient c/o constant chest aching since last night unrelieved with nitro. Last nitro at 0400. Patient now c/o nausea. Denies SOB.

## 2022-04-21 NOTE — Discharge Instructions (Addendum)
Please return for worsening or persistent symptoms.  The cardiologist I talked on the phone wanted me to increase your Imdur.  I have written you a new prescription for this.  Please follow-up with your cardiologist in the office.  I will give them a call today and see if they are okay with seeing you on Friday.

## 2022-05-04 NOTE — Progress Notes (Signed)
Cardiology Office Note:   Date:  05/05/2022  NAME:  Pedro Young    MRN: [id number] DOB:  1933/08/16   PCP:  Gregor Hams, FNP  Cardiologist:  Evalina Field, MD  Electrophysiologist:  None   Referring MD: Gregor Hams, FNP   Chief Complaint  Patient presents with   Follow-up        History of Present Illness:   Pedro Young is a 88 y.o. male with a hx of CAD, CKD V, HLD who presents for follow-up.  He was seen in the emergency room on 04/21/2022.  For chest pain.  EKG showed no acute ischemic changes.  High-sensitivity troponin 27 and 32 on repeat.  He has CKD stage V but not on hemodialysis.  Hemoglobin value 10.5.  Creatinine 4.2.  eGFR 13.  Not on dialysis.  Has a fistula in place.  Reports no further chest pain episodes.  Blood pressure 130/58.  On amlodipine 10 mg daily, metoprolol tartrate 50 mg twice daily, Imdur 90 mg daily.  No further chest pain episodes.  Reports she is walking 15 to 20 minutes/day without any limitations.  Exam unchanged.  We discussed his left heart catheterization which shows a small blockage on the backside of his heart.  I still believe this can be treated medically.  Problem List 1. CKD V/ESRD 2. HTN 3. Iron deficiency 4. CAD -LHC 06/19/2020 -100% occluded distal RCA with L to R collaterals  -70% OM1 -70% OM2 5. HLD -Total chol 125, HDL 38, LDL 65, triglycerides 120  Past Medical History: Past Medical History:  Diagnosis Date   Cancer ([place])    Hypertension    Renal disorder    CRF    Past Surgical History: Past Surgical History:  Procedure Laterality Date   AV FISTULA PLACEMENT Left 05/04/2019   Procedure: Arteriovenous (Av) Fistula Creation Left Arm;  Surgeon: Rosetta Posner, MD;  Location: [facility];  Service: Vascular;  Laterality: Left;   BIOPSY  08/10/2019   Procedure: BIOPSY;  Surgeon: Jackquline Denmark, MD;  Location: [facility];  Service: Endoscopy;;   CHOLECYSTECTOMY     COLONOSCOPY N/A 08/10/2019   Procedure:  COLONOSCOPY;  Surgeon: Jackquline Denmark, MD;  Location: [facility];  Service: Endoscopy;  Laterality: N/A;   EYE SURGERY Bilateral    cataract   LEFT HEART CATH AND CORONARY ANGIOGRAPHY N/A 06/19/2020   Procedure: LEFT HEART CATH AND CORONARY ANGIOGRAPHY;  Surgeon: Belva Crome, MD;  Location: [facility];  Service: Cardiovascular;  Laterality: N/A;   POLYPECTOMY  08/10/2019   Procedure: POLYPECTOMY;  Surgeon: Jackquline Denmark, MD;  Location: [facility];  Service: Endoscopy;;    Current Medications: Current Meds  Medication Sig   amLODipine (NORVASC) 10 MG tablet TAKE 1 TABLET BY MOUTH EVERY DAY (Patient taking differently: Take 10 mg by mouth daily.)   aspirin EC 81 MG tablet Take 1 tablet (81 mg total) by mouth daily. Swallow whole.   cephALEXin (KEFLEX) 250 MG capsule Take 1 capsule (250 mg total) by mouth daily.   diclofenac Sodium (VOLTAREN) 1 % GEL Apply 2 g topically 4 (four) times daily as needed (joint pain).   furosemide (LASIX) 40 MG tablet Take 40 mg by mouth daily. Prescribed by Dr Lorrene Reid [facility]) since May 2016   isosorbide mononitrate (IMDUR) 30 MG 24 hr tablet Take 3 tablets (90 mg total) by mouth daily.   mirtazapine (REMERON) 7.5 MG tablet Take 7.5 mg by mouth at bedtime.   MYRBETRIQ  25 MG TB24 tablet Take 25 mg by mouth daily.   nitroGLYCERIN (NITROSTAT) 0.4 MG SL tablet PLACE 1 TABLET UNDER THE TONGUE EVERY 5 MINUTES AS NEEDED FOR CHEST PAIN   rosuvastatin (CRESTOR) 40 MG tablet TAKE 1 TABLET BY MOUTH EVERY DAY (Patient taking differently: Take 40 mg by mouth daily.)   tamsulosin (FLOMAX) 0.4 MG CAPS capsule Take 0.4 mg by mouth daily.     Allergies:    Amoxicillin-pot clavulanate, Atorvastatin, and Lovastatin   Social History: Social History   Socioeconomic History   Marital status: Married    Spouse name: Not on file   Number of children: Not on file   Years of education: Not on file   Highest education level: Not on file  Occupational History    Not on file  Tobacco Use   Smoking status: Former    Types: Cigarettes    Quit date: 1956    Years since quitting: 67.5   Smokeless tobacco: Former    Quit date: 11/03/1954  Vaping Use   Vaping Use: Never used  Substance and Sexual Activity   Alcohol use: No    Alcohol/week: 0.0 standard drinks of alcohol   Drug use: No   Sexual activity: Not on file  Other Topics Concern   Not on file  Social History Narrative   From [place]   Moved to [place] to [place] 2006   5 daughters   Social Determinants of Radio broadcast assistant Strain: Not on file  Food Insecurity: Not on file  Transportation Needs: Not on file  Physical Activity: Not on file  Stress: Not on file  Social Connections: Not on file     Family History: The patient's family history includes Heart disease in his father and mother; Kidney disease in his father; Liver disease in his father.  ROS:   All other ROS reviewed and negative. Pertinent positives noted in the HPI.     EKGs/Labs/Other Studies Reviewed:   The following studies were personally reviewed by me today:  EKG:  EKG reviewed from the emergency room on 04/21/2022.  This EKG demonstrates sinus rhythm with no acute ischemic changes or evidence of infarction.  TTE 08/01/2020  1. Left ventricular ejection fraction by 3D volume is 66 %. The left  ventricle has normal function. The left ventricle demonstrates regional  wall motion abnormalities (see scoring diagram/findings for description).  There is mild left ventricular  hypertrophy. Left ventricular diastolic parameters are consistent with  Grade I diastolic dysfunction (impaired relaxation). The average left  ventricular global longitudinal strain is -19.7 %. The global longitudinal  strain is normal.   2. Right ventricular systolic function is normal. The right ventricular  size is normal. There is normal pulmonary artery systolic pressure. The  estimated right ventricular systolic  pressure is 55.9 mmHg.   3. Left atrial size was mildly dilated.   4. The mitral valve is grossly normal. Mild mitral valve regurgitation.  No evidence of mitral stenosis.   5. Tricuspid valve regurgitation is moderate.   6. The aortic valve is grossly normal. There is mild calcification of the  aortic valve. There is mild thickening of the aortic valve. Aortic valve  regurgitation is trivial. No aortic stenosis is present.   7. The inferior vena cava is dilated in size with >50% respiratory  variability, suggesting right atrial pressure of 8 mmHg.   LHC 06/19/2020 Dominant right coronary which is totally occluded distally.  The  distal RCA fills by left to right collaterals from both LAD and circumflex territory. Patent left main Luminal irregularities in the LAD but no significant obstructive disease. Large first obtuse marginal contains 70 to 80% stenosis in the mid body.  The continuation of the circumflex beyond a small second obtuse marginal is 70 to 80% narrowed. LVEDP is 9 mmHg.  Left ventriculography not performed.  Recent Labs: 09/27/2021: Magnesium 1.9 04/21/2022: ALT 20; BUN 85; Creatinine, Ser 4.20; Hemoglobin 10.5; Platelets 120; Potassium 4.9; Sodium 140   Recent Lipid Panel    Component Value Date/Time   CHOL 125 10/18/2021 0808   TRIG 120 10/18/2021 0808   HDL 38 (L) 10/18/2021 0808   CHOLHDL 3.3 10/18/2021 0808   CHOLHDL 5.3 08/02/2015 1705   VLDL 26 08/02/2015 1705   LDLCALC 65 10/18/2021 0808    Physical Exam:   VS:  BP (!) 130/58   Pulse 72   Ht $R'5\' 7"'tq$  (1.702 m)   Wt 113 lb 6.4 oz (51.4 kg)   SpO2 98%   BMI 17.76 kg/m    Wt Readings from Last 3 Encounters:  05/05/22 113 lb 6.4 oz (51.4 kg)  04/21/22 105 lb (47.6 kg)  11/11/21 115 lb 9.6 oz (52.4 kg)    General: Well nourished, well developed, in no acute distress Head: Atraumatic, normal size  Eyes: PEERLA, EOMI  Neck: Supple, no JVD Endocrine: No thryomegaly Cardiac: Normal S1, S2; RRR; no  murmurs, rubs, or gallops Lungs: Clear to auscultation bilaterally, no wheezing, rhonchi or rales  Abd: Soft, nontender, no hepatomegaly  Ext: No edema, pulses 2+ Musculoskeletal: No deformities, BUE and BLE strength normal and equal Skin: Warm and dry, no rashes   Neuro: Alert and oriented to person, place, time, and situation, CNII-XII grossly intact, no focal deficits  Psych: Normal mood and affect   ASSESSMENT:   Pedro Young is a 88 y.o. male who presents for the following: 1. Coronary artery disease of native artery of native heart with stable angina pectoris ([place])   2. Mixed hyperlipidemia     PLAN:   1. Coronary artery disease of native artery of native heart with stable angina pectoris ([place]) 2. Mixed hyperlipidemia -Known CAD with an occluded distal RCA with left-to-right collaterals.  He has 70% disease in OM1 and OM 2.  Recent [place] evaluated in the emergency room for chest pain.  Troponins were minimally elevated and flat.  EKG was nonischemic.  Chest pain symptoms have resolved.  He is back to walking 15 to 20 minutes/day without limitations.  His anemia has improved.  Suspect his minimal troponin elevation was secondary to his CKD stage V.  Still not on dialysis.  Overall still would recommend medical management.  Exam is unchanged.  Symptoms have resolved.  We will continue aspirin 81 mg daily, amlodipine 10 mg daily, metoprolol tartrate 50 mg twice daily, Imdur 90 mg daily.  He has sublingual nitroglycerin to take as needed.  On Crestor 40 mg daily.  Most recent LDL less than 70.  He will see me back in 6 months for further evaluation.  Overall his anemia is improved.  He is euvolemic on exam.  We will continue with aggressive medical management.  Disposition: Return in about 6 months (around 11/05/2022).  Medication Adjustments/Labs and Tests Ordered: Current medicines are reviewed at length with the patient today.  Concerns regarding medicines are outlined above.  No orders  of the defined types were placed in this encounter.  No orders of  the defined types were placed in this encounter.   Patient Instructions  Medication Instructions:  The current medical regimen is effective;  continue present plan and medications.  *If you need a refill on your cardiac medications before your next appointment, please call your pharmacy*   Follow-Up: At [facility], you and your health needs are our priority.  As part of our continuing mission to provide you with exceptional heart care, we have created designated Provider Care Teams.  These Care Teams include your primary Cardiologist (physician) and Advanced Practice Providers (APPs -  Physician Assistants and Nurse Practitioners) who all work together to provide you with the care you need, when you need it.  We recommend signing up for the patient portal called "MyChart".  Sign up information is provided on this After Visit Summary.  MyChart is used to connect with patients for Virtual Visits (Telemedicine).  Patients are able to view lab/test results, encounter notes, upcoming appointments, etc.  Non-urgent messages can be sent to your provider as well.   To learn more about what you can do with MyChart, go to [email or web address].    Your next appointment:   6 month(s)  The format for your next appointment:   In Person  Provider:   Evalina Field, MD {           Time Spent with Patient: I have spent a total of 25 minutes with patient reviewing hospital notes, telemetry, EKGs, labs and examining the patient as well as establishing an assessment and plan that was discussed with the patient.  > 50% of time was spent in direct patient care.  Signed, Addison Naegeli. Audie Box, MD, [facility]  [address], [place], [place] [address] [phone]  05/05/2022 8:44 AM

## 2022-05-05 ENCOUNTER — Encounter: Payer: Self-pay | Admitting: Cardiovascular Disease

## 2022-05-05 ENCOUNTER — Ambulatory Visit (INDEPENDENT_AMBULATORY_CARE_PROVIDER_SITE_OTHER): Payer: Medicare HMO | Admitting: Cardiovascular Disease

## 2022-05-05 VITALS — BP 130/58 | HR 72 | Ht 67.0 in | Wt 113.4 lb

## 2022-05-05 DIAGNOSIS — I25118 Atherosclerotic heart disease of native coronary artery with other forms of angina pectoris: Secondary | ICD-10-CM

## 2022-05-05 DIAGNOSIS — E782 Mixed hyperlipidemia: Secondary | ICD-10-CM

## 2022-05-05 NOTE — Patient Instructions (Signed)
Medication Instructions:  ?The current medical regimen is effective;  continue present plan and medications. ? ?*If you need a refill on your cardiac medications before your next appointment, please call your pharmacy* ? ? ?Follow-Up: ?At [facility], you and your health needs are our priority.  As part of our continuing mission to provide you with exceptional heart care, we have created designated Provider Care Teams.  These Care Teams include your primary Cardiologist (physician) and Advanced Practice Providers (APPs -  Physician Assistants and Nurse Practitioners) who all work together to provide you with the care you need, when you need it. ? ?We recommend signing up for the patient portal called "MyChart".  Sign up information is provided on this After Visit Summary.  MyChart is used to connect with patients for Virtual Visits (Telemedicine).  Patients are able to view lab/test results, encounter notes, upcoming appointments, etc.  Non-urgent messages can be sent to your provider as well.   ?To learn more about what you can do with MyChart, go to [email or web address].   ? ?Your next appointment:   ?6 month(s) ? ?The format for your next appointment:   ?In Person ? ?Provider:   ?Liebenthal T O'Neal, MD { ? ? ? ? ? ? ? ?

## 2022-05-25 DIAGNOSIS — E038 Other specified hypothyroidism: Secondary | ICD-10-CM | POA: Diagnosis present

## 2022-05-30 ENCOUNTER — Telehealth: Payer: Self-pay | Admitting: Cardiovascular Disease

## 2022-05-30 NOTE — Telephone Encounter (Addendum)
Spoke with daughter Herbie Saxon (reminded her to have patient add her and her sister to [facility]). Daughter is concerned that patient wants to travel to [place] by himself in September or October of this year. She stated the patient is forgetful (doesn't pay his bills) and is not as vibrant as in the past. She is concerned that when patient goes through customs, he may get anxious and have chest pains. She spoke with patient about her concerns of chest pain and not getting quality medical care if he needs it but, "he just laughs it off". Please advise on patient  traveling alone to [place].

## 2022-05-30 NOTE — Telephone Encounter (Signed)
Patient's daughter states the patient was in the hospital the last week of June for chest pain and now he is saying he wants to go to [place] by himself. She would like to know if it is okay for him to travel by himself. She states that she and her sister also came to the office yesterday and that there was an older man at the office who was very rude. She says she came in with her sister to speak with Dr. Audie Box or a nurse and he told them that he could not give any information.

## 2022-05-30 NOTE — Telephone Encounter (Addendum)
Daughter informed of the reply from Dr. Audie Box..Marland Kitchen"He has been stable. I am OK for him to go to [place] with his medications." She thanked me for calling her.

## 2022-07-24 ENCOUNTER — Other Ambulatory Visit: Payer: Self-pay | Admitting: Cardiovascular Disease

## 2022-08-11 ENCOUNTER — Other Ambulatory Visit: Payer: Self-pay | Admitting: Cardiovascular Disease

## 2022-10-16 ENCOUNTER — Encounter: Payer: Self-pay | Admitting: Physician Assistant

## 2022-10-16 ENCOUNTER — Ambulatory Visit: Payer: Medicare HMO | Attending: Physician Assistant | Admitting: Physician Assistant

## 2022-10-16 VITALS — BP 132/62 | HR 61 | Ht 63.0 in | Wt 116.8 lb

## 2022-10-16 DIAGNOSIS — I2510 Atherosclerotic heart disease of native coronary artery without angina pectoris: Secondary | ICD-10-CM | POA: Diagnosis not present

## 2022-10-16 DIAGNOSIS — I10 Essential (primary) hypertension: Secondary | ICD-10-CM

## 2022-10-16 DIAGNOSIS — E782 Mixed hyperlipidemia: Secondary | ICD-10-CM

## 2022-10-16 DIAGNOSIS — N185 Chronic kidney disease, stage 5: Secondary | ICD-10-CM

## 2022-10-16 MED ORDER — ISOSORBIDE MONONITRATE ER 60 MG PO TB24: 90.0000 mg | ORAL_TABLET | Freq: Every day | ORAL | 3 refills | Status: DC

## 2022-10-16 NOTE — Progress Notes (Signed)
Cardiology Office Note:    Date:  10/18/2022   ID:  Pedro Young, DOB 01-03-1933, MRN [id number]  PCP:  Gregor Hams, [place] [facility] Cardiologist:  Evalina Field, MD     Referring MD: Gregor Hams, FNP   Chief Complaint  Patient presents with   Follow-up    Seen for Dr. Audie Box    History of Present Illness:    Pedro Young is a 88 y.o. male with a hx of CAD, HTN, HLD and CKD V.  Previous cardiac catheterization performed in August 2021 showed 100% occluded distal RCA with left-to-right collaterals, 70% OM1 and 70% OM 2.  Medical therapy was recommended.  Echocardiogram obtained on 08/01/2020 showed EF 60 to 60%, grade 1 DD, RVSP 34.4 mmHg, moderate TR.  Patient was seen in the ED in June 2023 with chest pain.  Serial troponin 27--> 33.  She has CKD stage V and has now started on hemodialysis.  eGFR was 13.  Has a fistula in place.   Patient was last seen by Dr. Audie Box in July.  She presents today for 46-monthfollow-up.  Talking with the patient, he continued to have chest pain about 2-3 times a week.  His chest pain only occurs with physical exertion and never occurs at rest.  His chest pain is consistent with stable angina.  We discussed the difference between stable angina, unstable angina and heart attack.  He is not ready to start on dialysis yet, therefore we cannot proceed with cardiac catheterization.  He understands medical therapy cannot take away the underlying blockages.  However I still think medical therapy can at least try at some time.  He is not taking 90 mg daily of Imdur, instead he is only taking 60 mg at home.  I will increase the Imdur to 90 mg daily.  I asked him to move his amlodipine to nighttime to prevent the blood pressure variation during the day.  He is on the Toprol tartrate 50 mg twice a day.  I do not think I can increase the metoprolol as he is previous heart rate was in the 50s.  I recommended a 371-monthollow-up at  which time he will need a fasting lipid panel.  He is due for lipid panel, however he had coffee with cream this morning.  He is aware of that he will need to seek urgent medical attention if his chest pain does not go away despite 3 nitroglycerin.  Note, his wife is currently in the hospital after a fall that resulted in hip fracture.  Past Medical History:  Diagnosis Date   [facility]   Hypertension    Renal disorder    CRF    Past Surgical History:  Procedure Laterality Date   AV FISTULA PLACEMENT Left 05/04/2019   Procedure: Arteriovenous (Av) Fistula Creation Left Arm;  Surgeon: EaRosetta PosnerMD;  [facility];  Laterality: Left;   BIOPSY  08/10/2019   Procedure: BIOPSY;  Surgeon: GuJackquline DenmarkMD;  Location: [facility];  Service: Endoscopy;;   CHOLECYSTECTOMY     COLONOSCOPY N/A 08/10/2019   Procedure: COLONOSCOPY;  Surgeon: GuJackquline DenmarkMD;  Location: [facility];  Service: Endoscopy;  Laterality: N/A;   EYE SURGERY Bilateral    cataract   LEFT HEART CATH AND CORONARY ANGIOGRAPHY N/A 06/19/2020   Procedure: LEFT HEART CATH AND CORONARY ANGIOGRAPHY;  Surgeon: SmBelva CromeMD;  [facility];  Service: Cardiovascular;  Laterality: N/A;   POLYPECTOMY  08/10/2019   Procedure: POLYPECTOMY;  Surgeon: Jackquline Denmark, MD;  [facility];  Service: Endoscopy;;    Current Medications: Current Meds  Medication Sig   amLODipine (NORVASC) 10 MG tablet TAKE 1 TABLET BY MOUTH EVERY DAY (Patient taking differently: Take 10 mg by mouth daily.)   aspirin EC 81 MG tablet Take 1 tablet (81 mg total) by mouth daily. Swallow whole.   metoprolol tartrate (LOPRESSOR) 50 MG tablet Take 1 tablet (50 mg total) by mouth 2 (two) times daily.   nitroGLYCERIN (NITROSTAT) 0.4 MG SL tablet PLACE 1 TABLET UNDER THE TONGUE EVERY 5 MINUTES AS NEEDED FOR CHEST PAIN   rosuvastatin (CRESTOR) 40 MG tablet TAKE 1 TABLET BY MOUTH EVERY DAY   tamsulosin (FLOMAX)  0.4 MG CAPS capsule Take 0.4 mg by mouth daily.   [DISCONTINUED] isosorbide mononitrate (IMDUR) 30 MG 24 hr tablet Take 3 tablets (90 mg total) by mouth daily.     Allergies:   Amoxicillin-pot clavulanate, Atorvastatin, and Lovastatin   Social History   Socioeconomic History   Marital status: Married    Spouse name: Not on file   Number of children: Not on file   Years of education: Not on file   Highest education level: Not on file  Occupational History   Not on file  Tobacco Use   Smoking status: Former    Types: Cigarettes    Quit date: 59    Years since quitting: 68.0   Smokeless tobacco: Former    Quit date: 11/03/1954  Vaping Use   Vaping Use: Never used  Substance and Sexual Activity   Alcohol use: No    Alcohol/week: 0.0 standard drinks of alcohol   Drug use: No   Sexual activity: Not on file  Other Topics Concern   Not on file  Social History Narrative   From [place]   Moved to [place] to [place] 2006   5 daughters   Social Determinants of Radio broadcast assistant Strain: Not on file  Food Insecurity: Not on file  Transportation Needs: Not on file  Physical Activity: Not on file  Stress: Not on file  Social Connections: Not on file     Family History: The patient's family history includes Heart disease in his father and mother; Kidney disease in his father; Liver disease in his father.  ROS:   Please see the history of present illness.     All other systems reviewed and are negative.  EKGs/Labs/Other Studies Reviewed:    The following studies were reviewed today:  Cath 06/19/2020 Dominant right coronary which is totally occluded distally.  The distal RCA fills by left to right collaterals from both LAD and circumflex territory. Patent left main Luminal irregularities in the LAD but no significant obstructive disease. Large first obtuse marginal contains 70 to 80% stenosis in the mid body.  The continuation of the circumflex beyond a  small second obtuse marginal is 70 to 80% narrowed. LVEDP is 9 mmHg.  Left ventriculography not performed.   RECOMMENDATIONS:   Outpatient status. Spoke with Dr. Pearson Grippe, [facility], who will take responsibility for follow-up creatinine in 3 days (at his recommendation), and will arrange it to be performed at his office. Spoke with Dr. Eleonore Chiquito who will follow up with the patient in 1 week.  We previously discussed performing a basic metabolic panel in 48 hours, but after speaking  with Dr. Joelyn Oms, the follow-up blood work will be by [facility]. Increase medical therapy intensity.  If continued angina, CTO on RCA and possibly stenting the first obtuse marginal and distal circumflex if warranted by symptoms after the patient is on dialysis. Total contrast 21 cc.     Echo 08/01/2020 1. Left ventricular ejection fraction by 3D volume is 66 %. The left  ventricle has normal function. The left ventricle demonstrates regional  wall motion abnormalities (see scoring diagram/findings for description).  There is mild left ventricular  hypertrophy. Left ventricular diastolic parameters are consistent with  Grade I diastolic dysfunction (impaired relaxation). The average left  ventricular global longitudinal strain is -19.7 %. The global longitudinal  strain is normal.   2. Right ventricular systolic function is normal. The right ventricular  size is normal. There is normal pulmonary artery systolic pressure. The  estimated right ventricular systolic pressure is 88.5 mmHg.   3. Left atrial size was mildly dilated.   4. The mitral valve is grossly normal. Mild mitral valve regurgitation.  No evidence of mitral stenosis.   5. Tricuspid valve regurgitation is moderate.   6. The aortic valve is grossly normal. There is mild calcification of the  aortic valve. There is mild thickening of the aortic valve. Aortic valve  regurgitation is trivial. No aortic  stenosis is present.   7. The inferior vena cava is dilated in size with >50% respiratory  variability, suggesting right atrial pressure of 8 mmHg.     EKG:  EKG is not ordered today.   Recent Labs: 04/21/2022: ALT 20; BUN 85; Creatinine, Ser 4.20; Hemoglobin 10.5; Platelets 120; Potassium 4.9; Sodium 140  Recent Lipid Panel    Component Value Date/Time   CHOL 125 10/18/2021 0808   TRIG 120 10/18/2021 0808   HDL 38 (L) 10/18/2021 0808   CHOLHDL 3.3 10/18/2021 0808   CHOLHDL 5.3 08/02/2015 1705   VLDL 26 08/02/2015 1705   LDLCALC 65 10/18/2021 0808     Risk Assessment/Calculations:           Physical Exam:    VS:  BP 132/62   Pulse 61   Ht _0  (1.6 m)   Wt 116 lb 12.8 oz (53 kg)   SpO2 99%   BMI 20.69 kg/m        Wt Readings from Last 3 Encounters:  10/16/22 116 lb 12.8 oz (53 kg)  05/05/22 113 lb 6.4 oz (51.4 kg)  04/21/22 105 lb (47.6 kg)     GEN:  Well nourished, well developed in no acute distress HEENT: Normal NECK: No JVD; No carotid bruits LYMPHATICS: No lymphadenopathy CARDIAC: RRR, no murmurs, rubs, gallops RESPIRATORY:  Clear to auscultation without rales, wheezing or rhonchi  ABDOMEN: Soft, non-tender, non-distended MUSCULOSKELETAL:  No edema; No deformity  SKIN: Warm and dry NEUROLOGIC:  Alert and oriented x 3 PSYCHIATRIC:  Normal affect   ASSESSMENT:    1. Coronary artery disease involving native coronary artery of native heart without angina pectoris   2. Mixed hyperlipidemia   3. Primary hypertension   4. CKD (chronic kidney disease) stage 5, GFR less than 15 ml/min (HCC)    PLAN:    In order of problems listed above:  CAD: Patient continued to have exertional chest pain about 2-3 times a week.  Symptom does not occur at rest.  He has stage V kidney disease, however has not started on dialysis.  He is not ready to accept dialysis at this time,  therefore he is not a candidate for invasive study.  Will continue to titrate antianginal  medication.  Instead of 90 mg of Imdur, he has been taking 60 mg instead.  I asked him to increase Imdur to 90 mg daily.  We will reassess in 3 months, however if he has chest pain still persist despite 3 nitroglycerin, he will need to seek urgent medical attention  by calling 911.  At some point in the future, if he does progress to dialysis, may consider repeat cardiac catheterization.  Hyperlipidemia: On rosuvastatin  Hypertension: Blood pressure stable, I do not think I can uptitrate beta-blocker as he had heart rate in the 50s previously, I will however increase Imdur to 90 mg daily for antianginal purposes.(Although our medication list says he was supposed to be on 90 mg daily, he was previously only taking 60 mg daily.)  CKD stage V: He has a left arm fistula.  He has not started on dialysis yet.  Will defer management of Lasix to nephrologist.           Medication Adjustments/Labs and Tests Ordered: Current medicines are reviewed at length with the patient today.  Concerns regarding medicines are outlined above.  Orders Placed This Encounter  Procedures   Lipid panel   Meds ordered this encounter  Medications   isosorbide mononitrate (IMDUR) 60 MG 24 hr tablet    Sig: Take 1.5 tablets (90 mg total) by mouth daily.    Dispense:  135 tablet    Refill:  3    Patient Instructions  Medication Instructions:  INCREASE Imdur to 90 mg (1.5 tablets) daily  TAKE Amlodipine at night  *If you need a refill on your cardiac medications before your next appointment, please call your pharmacy*   Lab Work: Your physician recommends that you return for lab work at your earliest convenience:   Fasting Lipid Panel-DO NOT eat or drink past midnight. Okay to have water  If you have labs (blood work) drawn today and your tests are completely normal, you will receive your results only by: [place] (if you have MyChart) OR A paper copy in the mail If you have any lab test that is  abnormal or we need to change your treatment, we will call you to review the results.   Testing/Procedures: NONE ordered at this time of appointment   Follow-Up: At [facility], you and your health needs are our priority.  As part of our continuing mission to provide you with exceptional heart care, we have created designated Provider Care Teams.  These Care Teams include your primary Cardiologist (physician) and Advanced Practice Providers (APPs -  Physician Assistants and Nurse Practitioners) who all work together to provide you with the care you need, when you need it.  We recommend signing up for the patient portal called "MyChart".  Sign up information is provided on this After Visit Summary.  MyChart is used to connect with patients for Virtual Visits (Telemedicine).  Patients are able to view lab/test results, encounter notes, upcoming appointments, etc.  Non-urgent messages can be sent to your provider as well.   To learn more about what you can do with MyChart, go to [email or web address].    Your next appointment:   3 month(s)  The format for your next appointment:   In Person  Provider:   Evalina Field, MD  or Coletta Memos, FNP        Other Instructions  Important Information About Sugar  Hilbert Corrigan, [place]  10/18/2022 9:35 PM    [place]

## 2022-10-16 NOTE — Patient Instructions (Signed)
Medication Instructions:  INCREASE Imdur to 90 mg (1.5 tablets) daily  TAKE Amlodipine at night  *If you need a refill on your cardiac medications before your next appointment, please call your pharmacy*   Lab Work: Your physician recommends that you return for lab work at your earliest convenience:   Fasting Lipid Panel-DO NOT eat or drink past midnight. Okay to have water  If you have labs (blood work) drawn today and your tests are completely normal, you will receive your results only by: [place] (if you have MyChart) OR A paper copy in the mail If you have any lab test that is abnormal or we need to change your treatment, we will call you to review the results.   Testing/Procedures: NONE ordered at this time of appointment   Follow-Up: At [facility], you and your health needs are our priority.  As part of our continuing mission to provide you with exceptional heart care, we have created designated Provider Care Teams.  These Care Teams include your primary Cardiologist (physician) and Advanced Practice Providers (APPs -  Physician Assistants and Nurse Practitioners) who all work together to provide you with the care you need, when you need it.  We recommend signing up for the patient portal called "MyChart".  Sign up information is provided on this After Visit Summary.  MyChart is used to connect with patients for Virtual Visits (Telemedicine).  Patients are able to view lab/test results, encounter notes, upcoming appointments, etc.  Non-urgent messages can be sent to your provider as well.   To learn more about what you can do with MyChart, go to [email or web address].    Your next appointment:   3 month(s)  The format for your next appointment:   In Person  Provider:   Evalina Field, MD  or Coletta Memos, FNP        Other Instructions  Important Information About Sugar

## 2022-10-18 ENCOUNTER — Encounter: Payer: Self-pay | Admitting: Physician Assistant

## 2022-11-07 DIAGNOSIS — F329 Major depressive disorder, single episode, unspecified: Secondary | ICD-10-CM | POA: Diagnosis present

## 2022-11-17 ENCOUNTER — Telehealth: Payer: Self-pay | Admitting: Cardiovascular Disease

## 2022-11-17 ENCOUNTER — Other Ambulatory Visit: Payer: Self-pay

## 2022-11-17 MED ORDER — RANOLAZINE ER 500 MG PO TB12: 500.0000 mg | ORAL_TABLET | Freq: Two times a day (BID) | ORAL | 1 refills | Status: DC

## 2022-11-17 NOTE — Telephone Encounter (Signed)
  Pt c/o of Chest Pain: STAT if CP now or developed within 24 hours  1. Are you having CP right now? No, he is laying down now and does not currently have any symptoms  2. Are you experiencing any other symptoms (ex. SOB, nausea, vomiting, sweating)? SOB  3. How long have you been experiencing CP? 2 days  4. Is your CP continuous or coming and going? Comes and goes  5. Have you taken Nitroglycerin? yes ?   Patient's daughter Ree Edman states the patient is not doing well. She says for the last 2 days he has been getting very SOB and has chest pain on exertion. She says he cannot walking from the bedroom to the living room without getting out of breath. She says he has been having to take the nitroglycerin many times. She says he is not looking well, is very weak, and she is very concerned. She requests the call back go to: [phone].

## 2022-11-17 NOTE — Telephone Encounter (Signed)
Called daughter, advised of note from MD.   RX sent to pharmacy.  Patient daughter verbalized understanding

## 2022-11-17 NOTE — Telephone Encounter (Signed)
Contacted patient and his daughter (she is POA). They stated that over the weekend he began having chest pain, this occurs with exertion, and will subside with rest. He is SOB with exertion, and he becomes very fatigued and weak. They advised that it comes and goes and he is not currently having any pains. Sounds like same symptoms from recent PA visit.  Recent visit with PA in December advised that they would like to repeat cath, however patient kidney function has not improved. Recent blood work from 04/21/22:  BUN- 85, Creatinine- 4.20, GFR- 13. They were advised to increase Imdur to 90 mg, which patient verbalized that he is doing. He is having to take Nitroglycerin, she states at least twice in a day. Patient daughter states they have not had repeat labs recently (they do a lab slip) however, her mother is not doing well in health either and they have not been able to repeat. I did advise we should have this completed to review most recent labs. They are not taking BP/HR- I did advise to start doing this and keep a log, we also discussed ED precautions. They verbalized understanding of plan- will seek emergency care if chest pain continues or if 3 nitroglycerins are taken.  Will route to MD to review.

## 2022-11-18 LAB — LIPID PANEL
Chol/HDL Ratio: 2.2 ratio (ref 0.0–5.0)
Cholesterol, Total: 122 mg/dL (ref 100–199)
HDL: 55 mg/dL
LDL Chol Calc (NIH): 53 mg/dL (ref 0–99)
Triglycerides: 70 mg/dL (ref 0–149)
VLDL Cholesterol Cal: 14 mg/dL (ref 5–40)

## 2023-01-19 NOTE — Progress Notes (Signed)
Cardiology Office Note:   Date:  01/23/2023  NAME:  Pedro Young    MRN: [id number] DOB:  1933-05-13   PCP:  Gregor Hams, FNP  Cardiologist:  Evalina Field, MD  Electrophysiologist:  None   Referring MD: Gregor Hams, FNP   Chief Complaint  Patient presents with   Follow-up    3 months.    History of Present Illness:   Pedro Young is a 89 y.o. male with a hx of CAD, CKD V, HLD who presents for follow-up.  Overall reports he is doing well.  Denies any chest discomfort or shortness of breath.  He is exercising.  Doing activity loss.  No issues.  He has not used any sublingual nitroglycerin.  Blood pressure 138/54.  Most recent lipids are acceptable.  No concerning symptoms for angina.  EKG is normal.  Problem List 1. CKD V/ESRD 2. HTN 3. Iron deficiency 4. CAD -LHC 06/19/2020 -100% occluded distal RCA with L to R collaterals  -70% OM1 -70% OM2 5. HLD -T chol 142, TG 112, HDL 46, LDL 76  Past Medical History: Past Medical History:  Diagnosis Date   Cancer ([place])    Hypertension    Renal disorder    CRF    Past Surgical History: Past Surgical History:  Procedure Laterality Date   AV FISTULA PLACEMENT Left 05/04/2019   Procedure: Arteriovenous (Av) Fistula Creation Left Arm;  Surgeon: Rosetta Posner, MD;  Location: [facility];  Service: Vascular;  Laterality: Left;   BIOPSY  08/10/2019   Procedure: BIOPSY;  Surgeon: Jackquline Denmark, MD;  Location: [facility];  Service: Endoscopy;;   CHOLECYSTECTOMY     COLONOSCOPY N/A 08/10/2019   Procedure: COLONOSCOPY;  Surgeon: Jackquline Denmark, MD;  Location: [facility];  Service: Endoscopy;  Laterality: N/A;   EYE SURGERY Bilateral    cataract   LEFT HEART CATH AND CORONARY ANGIOGRAPHY N/A 06/19/2020   Procedure: LEFT HEART CATH AND CORONARY ANGIOGRAPHY;  Surgeon: Belva Crome, MD;  Location: [facility];  Service: Cardiovascular;  Laterality: N/A;   POLYPECTOMY  08/10/2019   Procedure: POLYPECTOMY;  Surgeon:  Jackquline Denmark, MD;  Location: [facility];  Service: Endoscopy;;    Current Medications: Current Meds  Medication Sig   amLODipine (NORVASC) 10 MG tablet Take 10 mg by mouth daily.   diclofenac Sodium (VOLTAREN) 1 % GEL Apply 2 g topically 4 (four) times daily as needed (joint pain).   furosemide (LASIX) 40 MG tablet Take 40 mg by mouth daily. Prescribed by Dr Lorrene Reid [facility]) since May 2016   isosorbide mononitrate (IMDUR) 60 MG 24 hr tablet Take 1.5 tablets (90 mg total) by mouth daily.   nitroGLYCERIN (NITROSTAT) 0.4 MG SL tablet PLACE 1 TABLET UNDER THE TONGUE EVERY 5 MINUTES AS NEEDED FOR CHEST PAIN   rosuvastatin (CRESTOR) 40 MG tablet TAKE 1 TABLET BY MOUTH EVERY DAY   tamsulosin (FLOMAX) 0.4 MG CAPS capsule Take 0.4 mg by mouth daily.   [DISCONTINUED] amLODipine (NORVASC) 10 MG tablet TAKE 1 TABLET BY MOUTH EVERY DAY (Patient taking differently: Take 10 mg by mouth daily.)   [DISCONTINUED] aspirin EC 81 MG tablet Take 1 tablet (81 mg total) by mouth daily. Swallow whole.   [DISCONTINUED] cephALEXin (KEFLEX) 250 MG capsule Take 1 capsule (250 mg total) by mouth daily.   [DISCONTINUED] mirtazapine (REMERON) 7.5 MG tablet Take 7.5 mg by mouth at bedtime.   [DISCONTINUED] MYRBETRIQ 25 MG TB24 tablet Take 25 mg by mouth daily.   [  DISCONTINUED] ranolazine (RANEXA) 500 MG 12 hr tablet Take 1 tablet (500 mg total) by mouth 2 (two) times daily.     Allergies:    Amoxicillin-pot clavulanate, Atorvastatin, and Lovastatin   Social History: Social History   Socioeconomic History   Marital status: Married    Spouse name: Not on file   Number of children: Not on file   Years of education: Not on file   Highest education level: Not on file  Occupational History   Not on file  Tobacco Use   Smoking status: Former    Types: Cigarettes    Quit date: 1956    Years since quitting: 68.2   Smokeless tobacco: Former    Quit date: 11/03/1954  Vaping Use   Vaping Use: Never used   Substance and Sexual Activity   Alcohol use: No    Alcohol/week: 0.0 standard drinks of alcohol   Drug use: No   Sexual activity: Not on file  Other Topics Concern   Not on file  Social History Narrative   From [place]   Moved to [place] to [place] 2006   5 daughters   Social Determinants of Radio broadcast assistant Strain: Not on file  Food Insecurity: Not on file  Transportation Needs: Not on file  Physical Activity: Not on file  Stress: Not on file  Social Connections: Not on file     Family History: The patient's family history includes Heart disease in his father and mother; Kidney disease in his father; Liver disease in his father.  ROS:   All other ROS reviewed and negative. Pertinent positives noted in the HPI.     EKGs/Labs/Other Studies Reviewed:   The following studies were personally reviewed by me today:  EKG:  EKG is ordered today.  The ekg ordered today demonstrates normal sinus rhythm heart rate 62, no acute ischemic changes or evidence of infarction, and was personally reviewed by me.   LHC 06/19/2020 Dominant right coronary which is totally occluded distally.  The distal RCA fills by left to right collaterals from both LAD and circumflex territory. Patent left main Luminal irregularities in the LAD but no significant obstructive disease. Large first obtuse marginal contains 70 to 80% stenosis in the mid body.  The continuation of the circumflex beyond a small second obtuse marginal is 70 to 80% narrowed. LVEDP is 9 mmHg.  Left ventriculography not performed.  Recent Labs: 04/21/2022: ALT 20; BUN 85; Creatinine, Ser 4.20; Hemoglobin 10.5; Platelets 120; Potassium 4.9; Sodium 140   Recent Lipid Panel    Component Value Date/Time   CHOL 122 11/18/2022 0911   TRIG 70 11/18/2022 0911   HDL 55 11/18/2022 0911   CHOLHDL 2.2 11/18/2022 0911   CHOLHDL 5.3 08/02/2015 1705   VLDL 26 08/02/2015 1705   LDLCALC 53 11/18/2022 0911    Physical  Exam:   VS:  BP (!) 148/54 (BP Location: Right Arm, Patient Position: Sitting, Cuff Size: Normal)   Pulse 62   Ht 5\' 3"  (1.6 m)   Wt 117 lb (53.1 kg)   BMI 20.73 kg/m    Wt Readings from Last 3 Encounters:  01/23/23 117 lb (53.1 kg)  10/16/22 116 lb 12.8 oz (53 kg)  05/05/22 113 lb 6.4 oz (51.4 kg)    General: Well nourished, well developed, in no acute distress Head: Atraumatic, normal size  Eyes: PEERLA, EOMI  Neck: Supple, no JVD Endocrine: No thryomegaly Cardiac: Normal S1, S2; RRR;  no murmurs, rubs, or gallops Lungs: Clear to auscultation bilaterally, no wheezing, rhonchi or rales  Abd: Soft, nontender, no hepatomegaly  Ext: No edema, pulses 2+ Musculoskeletal: No deformities, BUE and BLE strength normal and equal Skin: Warm and dry, no rashes   Neuro: Alert and oriented to person, place, time, and situation, CNII-XII grossly intact, no focal deficits  Psych: Normal mood and affect   ASSESSMENT:   Pedro Young is a 89 y.o. male who presents for the following: 1. Coronary artery disease involving native coronary artery of native heart without angina pectoris   2. Mixed hyperlipidemia   3. CKD (chronic kidney disease) stage 5, GFR less than 15 ml/min (HCC)     PLAN:   1. Coronary artery disease involving native coronary artery of native heart without angina pectoris 2. Mixed hyperlipidemia 3. CKD (chronic kidney disease) stage 5, GFR less than 15 ml/min (HCC) -Small vessel CAD.  CKD stage V not on dialysis.  We have managed this medically.  He has small vessels which are likely not amendable to PCI.  Dialysis would likely be imminent if he received high doses of contrast.  For now we will continue with medical therapy.  He is done quite well.  He has normal LV function.  No significant symptoms of angina.  Currently on amlodipine 10 mg daily, metoprolol titrate 50 mg p.o. daily and Imdur 60 mg daily.  Well-controlled on this regimen.  Will continue this.  He is on Crestor  40 mg daily.  LDL close to goal.  Given his age this is okay for me.  We discussed that anemia can worsen angina.  He is following closely with hematology nephrology.  He is still wanting to avoid dialysis if possible.  He will plan to see [place] yearly.  He will notify us of any change.  He has remained quite stable for quite some time.     Disposition: Return in about 1 year (around 01/23/2024).  Medication Adjustments/Labs and Tests Ordered: Current medicines are reviewed at length with the patient today.  Concerns regarding medicines are outlined above.  Orders Placed This Encounter  Procedures   EKG 12-Lead   No orders of the defined types were placed in this encounter.   Patient Instructions  Medication Instructions:  The current medical regimen is effective;  continue present plan and medications.  *If you need a refill on your cardiac medications before your next appointment, please call your pharmacy*    Follow-Up: At [facility], you and your health needs are our priority.  As part of our continuing mission to provide you with exceptional heart care, we have created designated Provider Care Teams.  These Care Teams include your primary Cardiologist (physician) and Advanced Practice Providers (APPs -  Physician Assistants and Nurse Practitioners) who all work together to provide you with the care you need, when you need it.  We recommend signing up for the patient portal called "MyChart".  Sign up information is provided on this After Visit Summary.  MyChart is used to connect with patients for Virtual Visits (Telemedicine).  Patients are able to view lab/test results, encounter notes, upcoming appointments, etc.  Non-urgent messages can be sent to your provider as well.   To learn more about what you can do with MyChart, go to [email or web address].    Your next appointment:   12 month(s)  Provider:   Evalina Field, MD         Time Spent with Patient: I  have  spent a total of 25 minutes with patient reviewing hospital notes, telemetry, EKGs, labs and examining the patient as well as establishing an assessment and plan that was discussed with the patient.  > 50% of time was spent in direct patient care.  Signed, Addison Naegeli. Audie Box, MD, [place]  [address] [place], [place] [address] [phone]  01/23/2023 10:32 AM

## 2023-01-23 ENCOUNTER — Ambulatory Visit: Payer: Medicare HMO | Attending: Cardiovascular Disease | Admitting: Cardiovascular Disease

## 2023-01-23 ENCOUNTER — Encounter: Payer: Self-pay | Admitting: Cardiovascular Disease

## 2023-01-23 VITALS — BP 148/54 | HR 62 | Ht 63.0 in | Wt 117.0 lb

## 2023-01-23 DIAGNOSIS — I2510 Atherosclerotic heart disease of native coronary artery without angina pectoris: Secondary | ICD-10-CM

## 2023-01-23 DIAGNOSIS — N185 Chronic kidney disease, stage 5: Secondary | ICD-10-CM | POA: Diagnosis not present

## 2023-01-23 DIAGNOSIS — E782 Mixed hyperlipidemia: Secondary | ICD-10-CM

## 2023-01-23 NOTE — Patient Instructions (Signed)
Medication Instructions:  The current medical regimen is effective;  continue present plan and medications.  *If you need a refill on your cardiac medications before your next appointment, please call your pharmacy*   Follow-Up: At [facility], you and your health needs are our priority.  As part of our continuing mission to provide you with exceptional heart care, we have created designated Provider Care Teams.  These Care Teams include your primary Cardiologist (physician) and Advanced Practice Providers (APPs -  Physician Assistants and Nurse Practitioners) who all work together to provide you with the care you need, when you need it.  We recommend signing up for the patient portal called "MyChart".  Sign up information is provided on this After Visit Summary.  MyChart is used to connect with patients for Virtual Visits (Telemedicine).  Patients are able to view lab/test results, encounter notes, upcoming appointments, etc.  Non-urgent messages can be sent to your provider as well.   To learn more about what you can do with MyChart, go to [email or web address].    Your next appointment:   12 month(s)  Provider:   [place] T O'Neal, MD   

## 2023-02-10 ENCOUNTER — Other Ambulatory Visit: Payer: Self-pay | Admitting: *Deleted

## 2023-02-10 DIAGNOSIS — N186 End stage renal disease: Secondary | ICD-10-CM

## 2023-02-12 ENCOUNTER — Ambulatory Visit: Payer: Medicare HMO

## 2023-02-12 ENCOUNTER — Ambulatory Visit (HOSPITAL_COMMUNITY): Payer: Medicare HMO

## 2023-02-13 ENCOUNTER — Ambulatory Visit (HOSPITAL_COMMUNITY)
Admission: RE | Admit: 2023-02-13 | Discharge: 2023-02-13 | Disposition: A | Discharge status: Home / Self Care | Payer: Medicare HMO | Source: Ambulatory Visit | Attending: Physician Assistant | Admitting: Physician Assistant

## 2023-02-13 DIAGNOSIS — N186 End stage renal disease: Secondary | ICD-10-CM | POA: Diagnosis not present

## 2023-02-16 ENCOUNTER — Ambulatory Visit (INDEPENDENT_AMBULATORY_CARE_PROVIDER_SITE_OTHER): Payer: Medicare HMO | Admitting: Physician Assistant

## 2023-02-16 VITALS — BP 105/52 | HR 50 | Temp 97.8°F | Resp 16 | Ht 65.0 in | Wt 116.0 lb

## 2023-02-16 DIAGNOSIS — N185 Chronic kidney disease, stage 5: Secondary | ICD-10-CM

## 2023-02-16 NOTE — Progress Notes (Unsigned)
Office Note   History of Present Illness   Pedro Young is a 89 y.o. (May 22, 1933) male who presents for re-evaluation of permanent access. He previously underwent creation of left radiocephalic fistula by Dr.Early on 05/04/2019. This was done to create permanent access for possible future dialysis needs. His fistula was maturing well postoperatively and he was cleared to use it if needed by 08/2019.  He returns today for follow up. He was sent to our office by his nephrologist because they are anticipating having to start dialysis fairly soon. His nephrologist has noticed some pulsatility of the left radiocephalic fistula and wanted to make sure his access was okay to use if needed. The patient denies any issues today. He still denies any symptoms of steal. He does not have a definitive dialysis start date yet.  Current Outpatient Medications  Medication Sig Dispense Refill   amLODipine (NORVASC) 10 MG tablet Take 10 mg by mouth daily.     diclofenac Sodium (VOLTAREN) 1 % GEL Apply 2 g topically 4 (four) times daily as needed (joint pain).     furosemide (LASIX) 40 MG tablet Take 40 mg by mouth daily. Prescribed by Dr Eliott Nine [facility]) since May 2016     isosorbide mononitrate (IMDUR) 60 MG 24 hr tablet Take 1.5 tablets (90 mg total) by mouth daily. 135 tablet 3   nitroGLYCERIN (NITROSTAT) 0.4 MG SL tablet PLACE 1 TABLET UNDER THE TONGUE EVERY 5 MINUTES AS NEEDED FOR CHEST PAIN 45 tablet 3   rosuvastatin (CRESTOR) 40 MG tablet TAKE 1 TABLET BY MOUTH EVERY DAY 90 tablet 3   tamsulosin (FLOMAX) 0.4 MG CAPS capsule Take 0.4 mg by mouth daily.     metoprolol tartrate (LOPRESSOR) 50 MG tablet Take 1 tablet (50 mg total) by mouth 2 (two) times daily. 180 tablet 3   No current facility-administered medications for this visit.    REVIEW OF SYSTEMS (negative unless checked):   Cardiac:   Chest pain or chest pressure?  Shortness of breath upon activity?  Shortness of breath when  lying flat?  Irregular heart rhythm?  Vascular:   Pain in calf, thigh, or hip brought on by walking?  Pain in feet at night that wakes you up from your sleep?  Blood clot in your veins?  Leg swelling?  Pulmonary:   Oxygen at home?  Productive cough?  Wheezing?  Neurologic:   Sudden weakness in arms or legs?  Sudden numbness in arms or legs?  Sudden onset of difficult speaking or slurred speech?  Temporary loss of vision in one eye?  Problems with dizziness?  Gastrointestinal:   Blood in stool?  Vomited blood?  Genitourinary:   Burning when urinating?  Blood in urine?  Psychiatric:   Major depression  Hematologic:   Bleeding problems?  Problems with blood clotting?  Dermatologic:   Rashes or ulcers?  Constitutional:   Fever or chills?  Ear/Nose/Throat:   Change in hearing?  Nose bleeds?  Sore throat?  Musculoskeletal:   Back pain?  Joint pain?  Muscle pain?   Physical Examination   Vitals:   02/16/23 1400  BP: (!) 105/52  Pulse: (!) 50  Resp: 16  Temp: 97.8 F (36.6 C)  TempSrc: Temporal  SpO2: 100%  Weight: 116 lb (52.6 kg)  Height:  (1.651 m)   Body mass index is 19.3 kg/m.  General:  WDWN in NAD; vital signs documented above Gait: Not observed HENT: WNL, normocephalic Pulmonary: normal non-labored breathing Cardiac: regular Abdomen:  soft, NT, no masses Skin: without rashes Vascular Exam/Pulses: palpable radial pulses bilaterally Extremities: left radiocephalic fistula with great thrill on palpation and moderate pulsatility. Fistula is tortuous throughout the forearm Musculoskeletal: no muscle wasting or atrophy  Neurologic: A&O X 3;  No focal weakness or paresthesias are detected Psychiatric:  The pt has Normal affect.   Non-invasive Vascular Imaging   left Arm Access Duplex  (02/13/2023):  +--------------------+----------+-----------------+--------+  AVF                 PSV (cm/s)Flow Vol (mL/min)Comments  +--------------------+----------+-----------------+--------+  Native artery inflow   345          2742                 +--------------------+----------+-----------------+--------+  AVF Anastomosis        443                               +--------------------+----------+-----------------+--------+     +------------+-------------------+-------------+----------+----------------  ----+  OUTFLOW VEIN    PSV (cm/s)     Diameter (cm)Depth (cm)      Describe         +------------+-------------------+-------------+----------+----------------  ----+  Shoulder           78             0.43        0.20                          +------------+-------------------+-------------+----------+----------------  ----+  Prox UA             188            0.46        0.19                          +------------+-------------------+-------------+----------+----------------  ----+  Mid UA              74             0.52        0.18                          +------------+-------------------+-------------+----------+----------------  ----+  Dist UA             73             0.56        0.16                          +------------+-------------------+-------------+----------+----------------  ----+  AC Fossa            170            0.61        0.26     Competing  branch                                                            measuring  0.43cm    +------------+-------------------+-------------+----------+----------------  ----+  Prox ForearmVelocity image did     0.63        0.16  not save                                                    +------------+-------------------+-------------+----------+----------------  ----+  Mid Forearm         137            1.01        0.15     Competing  branch                                                              measuring  0.48     +------------+-------------------+-------------+----------+----------------  ----+  Dist Forearm        691            1.02        0.16         stenotic         +------------+-------------------+-------------+----------+----------------  ----+     Medical Decision Making   Pedro Young is a 89 y.o. male who presents for repeat evaluation of left radiocephalic fistula  Duplex of the left radiocephalic fistula demonstrates a well-matured fistula in size and great flow volume. There is a concerning area of high grade stenosis in the distal forearm with velocities at 691 cm/s. There are also some competing branches noted On exam the fistula is easily palpable and a good size. It is fairly tortuous throughout the forearm and does have moderate pulsatility  I have explained to the patient he would likely need a fistulogram before his fistula can be used, given the high grade stenosis and pulsatility. He would prefer to wait as long as he can to do this procedure. He would like to do it shortly before he starts dialysis. He currently does not have a dialysis start date. I have told him and family present that they need to call our office to schedule a left radiocephalic fistulogram about a month prior to his dialysis start date. This can be with any surgeon from our office.They are aware of the plan and agreeable   Loel Dubonnet PA-C [facility] Office: [phone]  Clinic MD: Myra Gianotti

## 2023-04-06 ENCOUNTER — Other Ambulatory Visit: Payer: Self-pay

## 2023-04-06 ENCOUNTER — Emergency Department (HOSPITAL_BASED_OUTPATIENT_CLINIC_OR_DEPARTMENT_OTHER): Payer: Medicare HMO | Admitting: Radiology

## 2023-04-06 ENCOUNTER — Inpatient Hospital Stay (HOSPITAL_BASED_OUTPATIENT_CLINIC_OR_DEPARTMENT_OTHER)
Admission: EM | Admit: 2023-04-06 | Discharge: 2023-04-10 | DRG: 682 | Disposition: A | Discharge status: Home Health | Payer: Medicare HMO | Attending: Internal Medicine | Admitting: Internal Medicine

## 2023-04-06 ENCOUNTER — Encounter (HOSPITAL_BASED_OUTPATIENT_CLINIC_OR_DEPARTMENT_OTHER): Payer: Self-pay | Admitting: Emergency Medicine

## 2023-04-06 DIAGNOSIS — E872 Acidosis, unspecified: Secondary | ICD-10-CM | POA: Diagnosis present

## 2023-04-06 DIAGNOSIS — E876 Hypokalemia: Secondary | ICD-10-CM | POA: Diagnosis not present

## 2023-04-06 DIAGNOSIS — T82590A Other mechanical complication of surgically created arteriovenous fistula, initial encounter: Secondary | ICD-10-CM | POA: Diagnosis not present

## 2023-04-06 DIAGNOSIS — I959 Hypotension, unspecified: Secondary | ICD-10-CM | POA: Diagnosis present

## 2023-04-06 DIAGNOSIS — Z681 Body mass index (BMI) 19 or less, adult: Secondary | ICD-10-CM

## 2023-04-06 DIAGNOSIS — R296 Repeated falls: Secondary | ICD-10-CM | POA: Diagnosis present

## 2023-04-06 DIAGNOSIS — Z8249 Family history of ischemic heart disease and other diseases of the circulatory system: Secondary | ICD-10-CM

## 2023-04-06 DIAGNOSIS — N179 Acute kidney failure, unspecified: Secondary | ICD-10-CM | POA: Diagnosis present

## 2023-04-06 DIAGNOSIS — E861 Hypovolemia: Secondary | ICD-10-CM | POA: Diagnosis present

## 2023-04-06 DIAGNOSIS — Z7982 Long term (current) use of aspirin: Secondary | ICD-10-CM

## 2023-04-06 DIAGNOSIS — N186 End stage renal disease: Secondary | ICD-10-CM | POA: Diagnosis not present

## 2023-04-06 DIAGNOSIS — E8721 Acute metabolic acidosis: Secondary | ICD-10-CM | POA: Diagnosis present

## 2023-04-06 DIAGNOSIS — R0602 Shortness of breath: Secondary | ICD-10-CM | POA: Diagnosis present

## 2023-04-06 DIAGNOSIS — N401 Enlarged prostate with lower urinary tract symptoms: Secondary | ICD-10-CM | POA: Diagnosis present

## 2023-04-06 DIAGNOSIS — R052 Subacute cough: Secondary | ICD-10-CM | POA: Diagnosis present

## 2023-04-06 DIAGNOSIS — N184 Chronic kidney disease, stage 4 (severe): Secondary | ICD-10-CM | POA: Diagnosis not present

## 2023-04-06 DIAGNOSIS — D631 Anemia in chronic kidney disease: Secondary | ICD-10-CM | POA: Diagnosis present

## 2023-04-06 DIAGNOSIS — G253 Myoclonus: Secondary | ICD-10-CM | POA: Diagnosis present

## 2023-04-06 DIAGNOSIS — R062 Wheezing: Secondary | ICD-10-CM | POA: Diagnosis present

## 2023-04-06 DIAGNOSIS — N185 Chronic kidney disease, stage 5: Secondary | ICD-10-CM | POA: Diagnosis present

## 2023-04-06 DIAGNOSIS — G9341 Metabolic encephalopathy: Secondary | ICD-10-CM | POA: Diagnosis present

## 2023-04-06 DIAGNOSIS — I2510 Atherosclerotic heart disease of native coronary artery without angina pectoris: Secondary | ICD-10-CM | POA: Diagnosis present

## 2023-04-06 DIAGNOSIS — I10 Essential (primary) hypertension: Secondary | ICD-10-CM | POA: Diagnosis not present

## 2023-04-06 DIAGNOSIS — I120 Hypertensive chronic kidney disease with stage 5 chronic kidney disease or end stage renal disease: Secondary | ICD-10-CM | POA: Diagnosis present

## 2023-04-06 DIAGNOSIS — Z888 Allergy status to other drugs, medicaments and biological substances status: Secondary | ICD-10-CM

## 2023-04-06 DIAGNOSIS — Z880 Allergy status to penicillin: Secondary | ICD-10-CM

## 2023-04-06 DIAGNOSIS — E43 Unspecified severe protein-calorie malnutrition: Secondary | ICD-10-CM | POA: Insufficient documentation

## 2023-04-06 DIAGNOSIS — E785 Hyperlipidemia, unspecified: Secondary | ICD-10-CM | POA: Diagnosis present

## 2023-04-06 DIAGNOSIS — N19 Unspecified kidney failure: Secondary | ICD-10-CM

## 2023-04-06 DIAGNOSIS — Z1152 Encounter for screening for COVID-19: Secondary | ICD-10-CM | POA: Diagnosis not present

## 2023-04-06 DIAGNOSIS — Z9049 Acquired absence of other specified parts of digestive tract: Secondary | ICD-10-CM

## 2023-04-06 DIAGNOSIS — Z841 Family history of disorders of kidney and ureter: Secondary | ICD-10-CM

## 2023-04-06 DIAGNOSIS — E8722 Chronic metabolic acidosis: Secondary | ICD-10-CM | POA: Diagnosis present

## 2023-04-06 DIAGNOSIS — I440 Atrioventricular block, first degree: Secondary | ICD-10-CM | POA: Diagnosis present

## 2023-04-06 DIAGNOSIS — N250 Renal osteodystrophy: Secondary | ICD-10-CM | POA: Diagnosis present

## 2023-04-06 DIAGNOSIS — Z992 Dependence on renal dialysis: Secondary | ICD-10-CM | POA: Diagnosis not present

## 2023-04-06 DIAGNOSIS — Z87891 Personal history of nicotine dependence: Secondary | ICD-10-CM

## 2023-04-06 DIAGNOSIS — R410 Disorientation, unspecified: Secondary | ICD-10-CM

## 2023-04-06 DIAGNOSIS — J40 Bronchitis, not specified as acute or chronic: Secondary | ICD-10-CM

## 2023-04-06 DIAGNOSIS — E871 Hypo-osmolality and hyponatremia: Secondary | ICD-10-CM | POA: Diagnosis present

## 2023-04-06 DIAGNOSIS — Z636 Dependent relative needing care at home: Secondary | ICD-10-CM

## 2023-04-06 DIAGNOSIS — Z79899 Other long term (current) drug therapy: Secondary | ICD-10-CM

## 2023-04-06 DIAGNOSIS — W19XXXA Unspecified fall, initial encounter: Secondary | ICD-10-CM

## 2023-04-06 LAB — CBC
HCT: 25.7 % — ABNORMAL LOW (ref 39.0–52.0)
Hemoglobin: 8.9 g/dL — ABNORMAL LOW (ref 13.0–17.0)
MCH: 31.2 pg (ref 26.0–34.0)
MCHC: 34.6 g/dL (ref 30.0–36.0)
MCV: 90.2 fL (ref 80.0–100.0)
Platelets: 196 10*3/uL (ref 150–400)
RBC: 2.85 MIL/uL — ABNORMAL LOW (ref 4.22–5.81)
RDW: 13.7 % (ref 11.5–15.5)
WBC: 5.8 10*3/uL (ref 4.0–10.5)
nRBC: 0 % (ref 0.0–0.2)

## 2023-04-06 LAB — RESP PANEL BY RT-PCR (RSV, FLU A&B, COVID)  RVPGX2
Influenza A by PCR: NEGATIVE
Influenza B by PCR: NEGATIVE
Resp Syncytial Virus by PCR: NEGATIVE
SARS Coronavirus 2 by RT PCR: NEGATIVE

## 2023-04-06 LAB — TROPONIN I (HIGH SENSITIVITY): Troponin I (High Sensitivity): 13 ng/L (ref <18)

## 2023-04-06 LAB — BRAIN NATRIURETIC PEPTIDE: B Natriuretic Peptide: 664.6 pg/mL — ABNORMAL HIGH (ref 0.0–100.0)

## 2023-04-06 MED ORDER — ROSUVASTATIN CALCIUM 20 MG PO TABS: 40.0000 mg | ORAL_TABLET | Freq: Every day | ORAL | Status: DC

## 2023-04-06 MED ORDER — IPRATROPIUM-ALBUTEROL 0.5-2.5 (3) MG/3ML IN SOLN
3.0000 mL | Freq: Once | RESPIRATORY_TRACT | Status: AC
  Filled 2023-04-06: qty 3

## 2023-04-06 MED ORDER — SENNA 8.6 MG PO TABS: 1.0000 | ORAL_TABLET | Freq: Every day | ORAL | Status: DC | PRN

## 2023-04-06 MED ORDER — TAMSULOSIN HCL 0.4 MG PO CAPS
0.4000 mg | ORAL_CAPSULE | Freq: Every day | ORAL | Status: DC
  Administered 2023-04-07 – 2023-04-10 (×4): 0.4 mg via ORAL
  Filled 2023-04-06 (×4): qty 1

## 2023-04-06 MED ORDER — ROSUVASTATIN CALCIUM 5 MG PO TABS
10.0000 mg | ORAL_TABLET | Freq: Every day | ORAL | Status: DC
  Administered 2023-04-07 – 2023-04-10 (×4): 10 mg via ORAL
  Filled 2023-04-06 (×4): qty 2

## 2023-04-06 MED ORDER — SODIUM CHLORIDE 0.9 % IV BOLUS
250.0000 mL | Freq: Once | INTRAVENOUS | Status: AC
  Administered 2023-04-06: 250 mL via INTRAVENOUS

## 2023-04-06 MED ORDER — ACETAMINOPHEN 325 MG PO TABS
650.0000 mg | ORAL_TABLET | Freq: Four times a day (QID) | ORAL | Status: DC | PRN
  Administered 2023-04-06: 650 mg via ORAL
  Filled 2023-04-06: qty 2

## 2023-04-06 MED ORDER — HEPARIN SODIUM (PORCINE) 5000 UNIT/ML IJ SOLN
5000.0000 [IU] | Freq: Three times a day (TID) | INTRAMUSCULAR | Status: DC
  Administered 2023-04-06 – 2023-04-10 (×11): 5000 [IU] via SUBCUTANEOUS
  Filled 2023-04-06 (×11): qty 1

## 2023-04-06 MED ORDER — ONDANSETRON HCL 4 MG/2ML IJ SOLN
4.0000 mg | Freq: Four times a day (QID) | INTRAMUSCULAR | Status: DC | PRN
  Administered 2023-04-06: 4 mg via INTRAVENOUS
  Filled 2023-04-06: qty 2

## 2023-04-06 MED ORDER — SODIUM CHLORIDE 0.9% FLUSH
3.0000 mL | Freq: Two times a day (BID) | INTRAVENOUS | Status: DC
  Administered 2023-04-06 – 2023-04-08 (×4): 3 mL via INTRAVENOUS

## 2023-04-06 MED ORDER — ACETAMINOPHEN 650 MG RE SUPP: 650.0000 mg | Freq: Four times a day (QID) | RECTAL | Status: DC | PRN

## 2023-04-06 MED ORDER — ALBUTEROL SULFATE (2.5 MG/3ML) 0.083% IN NEBU
INHALATION_SOLUTION | RESPIRATORY_TRACT | Status: AC
  Administered 2023-04-06: 2.5 mg
  Filled 2023-04-06: qty 3

## 2023-04-06 MED ORDER — ONDANSETRON HCL 4 MG PO TABS: 4.0000 mg | ORAL_TABLET | Freq: Four times a day (QID) | ORAL | Status: DC | PRN

## 2023-04-06 MED ORDER — CHLORHEXIDINE GLUCONATE CLOTH 2 % EX PADS
6.0000 | MEDICATED_PAD | Freq: Every day | CUTANEOUS | Status: DC
  Administered 2023-04-07 – 2023-04-10 (×3): 6 via TOPICAL

## 2023-04-06 MED ORDER — ALBUTEROL SULFATE (2.5 MG/3ML) 0.083% IN NEBU: 2.5000 mg | INHALATION_SOLUTION | Freq: Once | RESPIRATORY_TRACT | Status: AC

## 2023-04-06 MED ORDER — ISOSORBIDE MONONITRATE ER 60 MG PO TB24
90.0000 mg | ORAL_TABLET | Freq: Every day | ORAL | Status: DC
  Administered 2023-04-07 – 2023-04-10 (×4): 90 mg via ORAL
  Filled 2023-04-06 (×4): qty 1

## 2023-04-06 MED ORDER — IPRATROPIUM-ALBUTEROL 0.5-2.5 (3) MG/3ML IN SOLN
RESPIRATORY_TRACT | Status: AC
  Filled 2023-04-06: qty 3

## 2023-04-06 NOTE — ED Notes (Signed)
[facility] at bedside Handoff report given 

## 2023-04-06 NOTE — ED Notes (Signed)
Pedro Young at [facility] will send transport. Bed Ready at [facility] Room# 03.-ABB(NS)

## 2023-04-06 NOTE — ED Provider Notes (Signed)
[facility] AT [facility] Provider Note   CSN: [id number] Arrival date & time: 04/06/23  1541     History  Chief Complaint  Patient presents with   Fall   Cough    Pedro Young is a 89 y.o. male.   Fall Associated symptoms include shortness of breath.  Cough Associated symptoms: shortness of breath      89 year old male with medical history significant for HTN, CKD presenting to the emergency department with multiple falls.  The patient has put off dialysis.  He has dialysis access in the form of an AV fistula in the left arm that was placed a year ago.  It has not been used and has matured well postoperatively and he had been cleared to use it by 08/2019.  He was notably during an office visit with vascular on 02/16/2019 for an area of high-grade stenosis in the distal forearm on Doppler of his fistula.  It was recommended that he undergo fistulogram prior to initiation of dialysis. He has been seen outpatient at his PCP after a fall and has been diagnosed with bronchitis after a cough.  He had negative chest x-ray and COVID-19, flu and RSV testing at that time.  He and continues to endorse a cough and wheezing there is been going on for roughly the last 3 weeks.  He denies any history of COPD, denies any smoking history.  He denies any chest pain, endorses mild dyspnea.  He still makes urine.  Denies any urinary symptoms.  No abdominal pain, nausea or vomiting.  He has had 3 total falls over the past week, denies any head trauma or neck trauma during these falls, no loss of consciousness.  He fell onto his right arm but denies any pain today.    Home Medications Prior to Admission medications   Medication Sig Start Date End Date Taking? Authorizing Provider  amLODipine (NORVASC) 10 MG tablet Take 10 mg by mouth daily.    [provider]  diclofenac Sodium (VOLTAREN) 1 % GEL Apply 2 g topically 4 (four) times daily as needed (joint pain).  08/31/21   [provider]  furosemide (LASIX) 40 MG tablet Take 40 mg by mouth daily. Prescribed by Dr Eliott Nine [facility]) since May 2016    [provider]  isosorbide mononitrate (IMDUR) 60 MG 24 hr tablet Take 1.5 tablets (90 mg total) by mouth daily. 10/16/22   Azalee Course, PA  metoprolol tartrate (LOPRESSOR) 50 MG tablet Take 1 tablet (50 mg total) by mouth 2 (two) times daily. 08/01/21 10/16/22  Sande [facility], MD  nitroGLYCERIN (NITROSTAT) 0.4 MG SL tablet PLACE 1 TABLET UNDER THE TONGUE EVERY 5 MINUTES AS NEEDED FOR CHEST PAIN 10/16/21   Sande [facility], MD  rosuvastatin (CRESTOR) 40 MG tablet TAKE 1 TABLET BY MOUTH EVERY DAY 07/24/22   O'Neal, Ronnald Ramp, MD  tamsulosin (FLOMAX) 0.4 MG CAPS capsule Take 0.4 mg by mouth daily. 06/14/20   [provider]      Allergies    Amoxicillin-pot clavulanate, Atorvastatin, and Lovastatin    Review of Systems   Review of Systems  Respiratory:  Positive for cough and shortness of breath.   All other systems reviewed and are negative.   Physical Exam Updated Vital Signs BP (!) 118/59   Pulse 63   Temp 97.8 F (36.6 C) (Oral)   Resp 20   SpO2 100%  Physical Exam Vitals and nursing note reviewed.  Constitutional:  General: He is not in acute distress.    Appearance: He is well-developed.  HENT:     Head: Normocephalic and atraumatic.  Eyes:     Conjunctiva/sclera: Conjunctivae normal.  Cardiovascular:     Rate and Rhythm: Normal rate and regular rhythm.     Heart sounds: No murmur heard. Pulmonary:     Effort: Pulmonary effort is normal. No respiratory distress.     Breath sounds: Rhonchi present.     Comments: Rhonchi all lung fields, had previously been wheezing per RT but is now status post DuoNebs x 2 Abdominal:     Palpations: Abdomen is soft.     Tenderness: There is no abdominal tenderness.  Musculoskeletal:        General: No swelling.     Cervical back: Neck supple.      Comments: Left AV fistula in place, palpable thrill  Skin:    General: Skin is warm and dry.     Capillary Refill: Capillary refill takes less than 2 seconds.  Neurological:     Mental Status: He is alert.  Psychiatric:        Mood and Affect: Mood normal.     ED Results / Procedures / Treatments   Labs (all labs ordered are listed, but only abnormal results are displayed) Labs Reviewed  BASIC METABOLIC PANEL - Abnormal; Notable for the following components:      Result Value   Sodium 127 (*)    Chloride 97 (*)    CO2 17 (*)    BUN >100 (*)    Creatinine, Ser 7.37 (*)    Calcium 7.4 (*)    GFR, Estimated 7 (*)    All other components within normal limits  CBC - Abnormal; Notable for the following components:   RBC 2.85 (*)    Hemoglobin 8.9 (*)    HCT 25.7 (*)    All other components within normal limits  RESP PANEL BY RT-PCR (RSV, FLU A&B, COVID)  RVPGX2  BRAIN NATRIURETIC PEPTIDE  TROPONIN I (HIGH SENSITIVITY)    EKG EKG Interpretation  Date/Time:  Monday April 06 2023 15:53:09 EDT Ventricular Rate:  62 PR Interval:  236 QRS Duration: 90 QT Interval:  446 QTC Calculation: 452 R Axis:   73 Text Interpretation: Sinus rhythm with 1st degree A-V block Cannot rule out Anterior infarct , age undetermined Abnormal ECG When compared with ECG of 21-Apr-2022 09:48, PREVIOUS ECG IS PRESENT Confirmed by Ernie Avena ([id number]) on 04/06/2023 4:57:46 PM  Radiology DG Chest 2 View  Result Date: 04/06/2023 CLINICAL DATA:  Shortness of breath EXAM: CHEST - 2 VIEW COMPARISON:  None Available. FINDINGS: The heart size and mediastinal contours are within normal limits. Both lungs are clear. The visualized skeletal structures are unremarkable. Prominent appearance of the costochondral margins again noted IMPRESSION: No active cardiopulmonary disease. Electronically Signed   By: Deatra Robinson M.D.   On: 04/06/2023 16:28    Procedures Procedures    Medications Ordered in ED Medications   albuterol (PROVENTIL) (2.5 MG/3ML) 0.083% nebulizer solution 2.5 mg (2.5 mg Nebulization Not Given 04/06/23 1654)  ipratropium-albuterol (DUONEB) 0.5-2.5 (3) MG/3ML nebulizer solution 3 mL ( Nebulization Given 04/06/23 1655)    ED Course/ Medical Decision Making/ A&P                             Medical Decision Making Amount and/or Complexity of Data Reviewed Labs: ordered. Radiology: ordered.  Risk  Prescription drug management. Decision regarding hospitalization.    89 year old male with medical history significant for HTN, CKD presenting to the emergency department with multiple falls.  The patient has put off dialysis.  He has dialysis access in the form of an AV fistula in the left arm that was placed a year ago.  It has not been used and has matured well postoperatively and he had been cleared to use it by 08/2019.  He was notably during an office visit with vascular on 02/16/2019 for an area of high-grade stenosis in the distal forearm on Doppler of his fistula.  It was recommended that he undergo fistulogram prior to initiation of dialysis. He has been seen outpatient at his PCP after a fall and has been diagnosed with bronchitis after a cough.  He had negative chest x-ray and COVID-19, flu and RSV testing at that time.  He and continues to endorse a cough and wheezing there is been going on for roughly the last 3 weeks.  He denies any history of COPD, denies any smoking history.  He denies any chest pain, endorses mild dyspnea.  He still makes urine.  Denies any urinary symptoms.  No abdominal pain, nausea or vomiting.  He has had 3 total falls over the past week, denies any head trauma or neck trauma during these falls, no loss of consciousness.  He fell onto his right arm but denies any pain today.    On arrival, the patient was afebrile, not tachycardic or tachypneic, saturating 99% on room air, initially blood pressure 89/50, subsequent proved to 118/59 without intervention.  Diffuse  wheezing was noted by RT on exam and the patient was started on DuoNeb treatments with improvement on my exam, the patient had mild scattered coarse rhonchi.  He does not appear hypervolemic, no Rales, no JVD, no lower extremity swelling.  He has an AV fistula in place that was initially placed in 2020 but had an area of high-grade stenosis and has not yet been accessed for dialysis.  His symptoms are consistent with likely a viral bronchitis that is developing pneumonia.  X-ray imaging was performed which was negative for acute cardiac or pulmonary abnormality.  A CBC was without a leukocytosis, and a hemoglobin was anemic to 8.9 but close to his baseline.  His initial troponin was 13 and COVID-19, influenza and RSV PCR testing was negative.  His BMP revealed acute on chronic uremia and AKI on chronic CKD with worsening creatinine to 7.37 from a baseline of around 4 and worsening uremia with a BUN greater than 100 from a baseline of around 70-80.  Given the patient's intermittent confusion and multiple falls over the last few days, I suspect that his uremia is contributing to his presentation.  Spoke with Dr. Arrie Aran, nephrology, who agrees pt likely would benefit from initiation of dialysis. Recommended medicine admission and will see in consultation.  Hospitalist medicine consulted for admission and the patient was subsequently admitted in stable condition.  Family were updated bedside regarding the plan of care.   Final Clinical Impression(s) / ED Diagnoses Final diagnoses:  ESRD (end stage renal disease) (HCC)  Uremia  Bronchitis  Wheezing  Subacute cough  Fall, initial encounter  Confusion    Rx / DC Orders ED Discharge Orders     None         Ernie Avena, MD 04/06/23 [phone]

## 2023-04-06 NOTE — Consult Note (Addendum)
Reason for Consult: AKI/CKD stage V Referring Physician:   [name] is an 89 y.o. 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  His daughter reports that his symptoms have been worsening over the past 6 weeks and has lost 12 lbs over that time period.  He admits to poor appetite and has not been eating or drinking much.  He has fallen 3 times, mainly at night on his way to the bathr[place] without head injury.  In the ED, VSS, SpO2 100%.  Labs notable for Na 127, Cl 97, Co2 17, BUN >100, Cr 7.37, Ca 7.4, Hgb 8.9.  Repeat CXR without active cardiopulmonary disease.  We were consulted due to the progression of his CKD.  Pedro Young is followed closely at our office by Dr. Marisue Young and has been putting off dialysis for the past year.  His wife is in hospice and he did not want to pursue dialysis if she passed away.  His last Scr was 4.97 on 02/13/23 and was due to be seen in our office soon.  His daughter also reports involuntary jerking, unsteadiness upon standing, and generalized weakness.    He denies any N/V/D, dysgeusia, or lower extremity edema.  Trend in Creatinine: Creatinine, Ser  Date/Time Value Ref Range Status  04/06/2023 03:57 PM 7.37 (H) 0.61 - 1.24 mg/dL Final  16/08/9603 54:09 AM 4.20 (H) 0.61 - 1.24 mg/dL Final  81/19/1478 29:56 PM 5.80 (H) 0.61 - 1.24 mg/dL Final  21/30/8657 84:69 PM 5.18 (H) 0.61 - 1.24 mg/dL Final  62/95/2841 32:44 AM 4.40 (H) 0.76 - 1.27 mg/dL Final  11/05/7251 66:44 PM 3.07 (H) 0.40 - 1.50 mg/dL Final  03/47/4259 56:38 AM 3.80 (H) 0.61 - 1.24 mg/dL Final  75/64/3329 51:88 AM 2.56 (H) 0.61 - 1.24 mg/dL Final  41/66/0630 16:01 AM 2.88 (H) 0.61 - 1.24 mg/dL Final  09/32/3557 32:20 AM 3.32 (H) 0.61 - 1.24 mg/dL Final  25/42/7062 37:62 AM 3.72 (H) 0.61 - 1.24 mg/dL Final   83/15/1761 60:73 AM 4.43 (H) 0.61 - 1.24 mg/dL Final  71/04/2693 85:46 PM 3.82 (H) 0.61 - 1.24 mg/dL Final  27/01/5008 38:18 AM 3.47 (H) 0.61 - 1.24 mg/dL Final  29/93/7169 67:89 AM 2.03 (H) 0.61 - 1.24 mg/dL Final  38/08/1750 02:58 AM 2.02 (H) 0.61 - 1.24 mg/dL Final  52/77/8242 35:36 AM 2.32 (H) 0.61 - 1.24 mg/dL Final  14/43/1540 08:67 AM 2.34 (H) 0.61 - 1.24 mg/dL Final  61/95/0932 67:12 AM 2.48 (H) 0.61 - 1.24 mg/dL Final  45/80/9983 38:25 AM 2.60 (H) 0.61 - 1.24 mg/dL Final  05/39/7673 41:93 AM 3.85 (H) 0.61 - 1.24 mg/dL Final  79/12/4095 35:32 PM 3.83 (H) 0.61 - 1.24 mg/dL Final  99/24/2683 41:96 PM 3.91 (H) 0.61 - 1.24 mg/dL Final  22/29/7989 21:19 AM 2.78 (H) 0.4 - 1.5 mg/dL Final  41/74/0814 48:18 AM 2.96 (H) 0.4 - 1.5 mg/dL Final  56/31/4970 26:37 PM 3.27 (H) 0.4 - 1.5 mg/dL Final  85/88/5027 74:12 AM 3.03 (H) 0.4 - 1.5 mg/dL Final  87/86/7672 09:47 AM 2.74 (H) 0.4 - 1.5 mg/dL Final  09/62/8366 29:47 PM 2.70 (H) 0.4 - 1.5 mg/dL Final  65/46/5035 46:56 AM 2.29 (H) 0.4 - 1.5 mg/dL Final  81/27/5170 01:74 PM 2.29 (H) 0.4 - 1.5 mg/dL Final  94/49/6759 16:38 AM 2.5 (H)  0.4 - 1.5 mg/dL Final    PMH:   Past Medical History:  Diagnosis Date   Cancer (HCC)    Hypertension    Renal disorder    CRF    PSH:   Past Surgical History:  Procedure Laterality Date   AV FISTULA PLACEMENT Left 05/04/2019   Procedure: Arteriovenous (Av) Fistula Creation Left Arm;  Surgeon: Pedro Earthly, MD;  Location: [facility] OR;  Service: Vascular;  Laterality: Left;   BIOPSY  08/10/2019   Procedure: BIOPSY;  Surgeon: Pedro Bologna, MD;  Location: [facility] ENDOSCOPY;  Service: Endoscopy;;   CHOLECYSTECTOMY     COLONOSCOPY N/A 08/10/2019   Procedure: COLONOSCOPY;  Surgeon: Pedro Bologna, MD;  Location: [facility] ENDOSCOPY;  Service: Endoscopy;  Laterality: N/A;   EYE SURGERY Bilateral    cataract   LEFT HEART CATH AND CORONARY ANGIOGRAPHY N/A 06/19/2020   Procedure: LEFT HEART CATH AND CORONARY ANGIOGRAPHY;  Surgeon:  Pedro Records, MD;  Location: [facility] INVASIVE CV LAB;  Service: Cardiovascular;  Laterality: N/A;   POLYPECTOMY  08/10/2019   Procedure: POLYPECTOMY;  Surgeon: Pedro Bologna, MD;  Location: [facility] ENDOSCOPY;  Service: Endoscopy;;    Allergies:  Allergies  Allergen Reactions   Amoxicillin-Pot Clavulanate Other (See Comments)    Did it involve swelling of the face/tongue/throat, SOB, or low BP? Unknown Did it involve sudden or severe rash/hives, skin peeling, or any reaction on the inside of your mouth or nose? Unknown Did you need to seek medical attention at a hospital or doctor's office? Unknown When did it last happen? patient is unfamiliar with this allergy   If all above answers are "NO", may proceed with cephalosporin use.    Atorvastatin     REACTION: arthralgia   Lovastatin     REACTION: arthralgia    Medications:   Prior to Admission medications   Medication Sig Start Date End Date Taking? Authorizing Provider  amLODipine (NORVASC) 10 MG tablet Take 10 mg by mouth daily.    [provider]  diclofenac Sodium (VOLTAREN) 1 % GEL Apply 2 g topically 4 (four) times daily as needed (joint pain). 08/31/21   [provider]  furosemide (LASIX) 40 MG tablet Take 40 mg by mouth daily. Prescribed by Dr [facility]) since May 2016    [provider]  isosorbide mononitrate (IMDUR) 60 MG 24 hr tablet Take 1.5 tablets (90 mg total) by mouth daily. 10/16/22   Pedro Course, PA  metoprolol tartrate (LOPRESSOR) 50 MG tablet Take 1 tablet (50 mg total) by mouth 2 (two) times daily. 08/01/21 10/16/22  Pedro [facility], MD  nitroGLYCERIN (NITROSTAT) 0.4 MG SL tablet PLACE 1 TABLET UNDER THE TONGUE EVERY 5 MINUTES AS NEEDED FOR CHEST PAIN 10/16/21   Pedro [facility], MD  rosuvastatin (CRESTOR) 40 MG tablet TAKE 1 TABLET BY MOUTH EVERY DAY 07/24/22   Young, Pedro Ramp, MD  tamsulosin (FLOMAX) 0.4 MG CAPS capsule Take 0.4 mg by mouth daily. 06/14/20   [provider]    Inpatient medications:   Discontinued Meds:  There are no discontinued medications.  Social History:  reports that he quit smoking about 68 years ago. His smoking use included cigarettes. He quit smokeless tobacco use about 68 years ago. He reports that he does not drink alcohol and does not use drugs.  Family History:   Family History  Problem Relation Age of Onset   Liver disease Father    Kidney disease Father    Heart disease Father  before age 86   Heart disease Mother        before age 78    Pertinent items are noted in HPI. Weight change:  No intake or output data in the 24 hours ending 04/06/23 2014 BP (!) 108/56 (BP Location: Right Arm)   Pulse 66   Temp (!) 97.5 F (36.4 C)   Resp 18   SpO2 97%  Vitals:   04/06/23 1815 04/06/23 1830 04/06/23 1845 04/06/23 2005  BP: 120/60 115/61 (!) 118/58 (!) 108/56  Pulse: 68 70 66 66  Resp: (!) 26 (!) 21 19 18   Temp:    (!) 97.5 F (36.4 C)  TempSrc:      SpO2: 98% 97% 97% 97%     General appearance: cachectic, fatigued, and no distress Head: Normocephalic, without obvious abnormality, atraumatic Resp: rhonchi bilaterally Cardio: regular rate and rhythm, S1, S2 normal, no murmur, click, rub or gallop GI: soft, non-tender; bowel sounds normal; no masses,  no organomegaly Extremities: extremities normal, atraumatic, no cyanosis or edema and Left AVF which is tortuous with +T/B up the entire length of his arm.  Labs: Basic Metabolic Panel: Recent Labs  Lab 04/06/23 1557  NA 127*  K 4.8  CL 97*  CO2 17*  GLUCOSE 86  BUN >100*  CREATININE 7.37*  CALCIUM 7.4*   Liver Function Tests: No results for input(s): "AST", "ALT", "ALKPHOS", "BILITOT", "PROT", "ALBUMIN" in the last 168 hours. No results for input(s): "LIPASE", "AMYLASE" in the last 168 hours. No results for input(s): "AMMONIA" in the last 168 hours. CBC: Recent Labs  Lab 04/06/23 1557  WBC 5.8  HGB 8.9*  HCT 25.7*  MCV 90.2   PLT 196   PT/INR: @LABRCNTIP (inr:5) Cardiac Enzymes: )No results for input(s): "CKTOTAL", "CKMB", "CKMBINDEX", "TROPONINI" in the last 168 hours. CBG: No results for input(s): "GLUCAP" in the last 168 hours.  Iron Studies: No results for input(s): "IRON", "TIBC", "TRANSFERRIN", "FERRITIN" in the last 168 hours.  Xrays/Other Studies: DG Chest 2 View  Result Date: 04/06/2023 CLINICAL DATA:  Shortness of breath EXAM: CHEST - 2 VIEW COMPARISON:  None Available. FINDINGS: The heart size and mediastinal contours are within normal limits. Both lungs are clear. The visualized skeletal structures are unremarkable. Prominent appearance of the costochondral margins again noted IMPRESSION: No active cardiopulmonary disease. Electronically Signed   By: Deatra Robinson M.D.   On: 04/06/2023 16:28     Assessment/Plan:  End Stage Kidney Disease - he is now showing signs of uremia with anorexia, weakness, 12 lb weight loss, and myoclonic jerking.  I discussed at length the process of dialysis and what to expect, as well as the need to have arrangements for outpatient dialysis prior to his discharge.  We also discussed the need for daily, short and slow sessions which gradually increase in time and speed of BFR.  They are both in agreement to initiate dialysis.  Will need to consult SW/CM and Renal Navigator to help arrange for outpatient dialysis as well as transportation.   Avoid nephrotoxic medications including NSAIDs and iodinated intravenous contrast exposure unless the latter is absolutely indicated.   Preferred narcotic agents for pain control are hydromorphone, fentanyl, and methadone. Morphine should not be used.  Avoid Baclofen and avoid oral sodium phosphate and magnesium citrate based laxatives / bowel preps.  Continue strict Input and Output monitoring. Will monitor the patient closely with you and intervene or adjust therapy as indicated by changes in clinical status/labs   Vascular access -  he  has evidence of high grade stenosis in the distal forearm by duplex and is to have a fistulogram (was to be done prior to initiating dialysis).  Will consult VVS in am and attempt HD tomorrow. Anemia of CKD stage V - will check iron stores and start ESA.  Transfuse for Hgb <7. Falls - likely due to deconditioning and may benefit from PT/OT. 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 Mild hyponatremia - likely combination of volume depletion and ESRD.  Will follow with HD HTN - bp on low side.  Stop amlodipine and furosemide for now and follow.  He appears volume depleted and may benefit from small bolus of NS at 250 ml. Disposition - he has stated that he would not want dialysis if his wife, who is in hospice passed away.  Will follow his tolerance of HD moving forward, however he may eventually decide to proceed with hospice if something were to happen to her.    Julien Nordmann Dasani Crear 04/06/2023, 8:14 PM

## 2023-04-06 NOTE — ED Triage Notes (Signed)
Pt arrived POV from home, fell a few weeks ago and had a non-productive cough, Xrays clear. Saturday fell again, feeling weak this am and fell again. Found on the floor, no LOC per sister. No N/V/D. Afebrile.

## 2023-04-06 NOTE — H&P (Signed)
History and Physical    Pedro Young ZOX:[id number] DOB: [birth year] DOA: 04/06/2023  PCP: Maudie Flakes, FNP   Patient coming from: Home  Chief Complaint: General weakness, falls   HPI: Pedro Young is a pleasant 89 y.o. 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 He has also had loss of appetite, 12 lb weight-loss, and myoclonus.   He denies any appreciable injury from the falls. He denies abdominal pain or N/V but has not had much appetite and food sometimes tastes different.   He denies chest pain, fever, or chills. He has a radiocephalic fistula in the LUE which has some pulsatility.   [facility] ED Course: Upon arrival to the ED, patient is found to be afebrile and saturating well on room air with normal HR and SBP 89 to 120. EKG demonstrates SR with 1st degree AV block and CXR is negative for acute findings. Labs are most notable for serum sodium 127, bicarbonate 17, BUN >100, SCr 7.37, Hgb 8.9, and BNP 665.   Nephrology was consulted by the ED physician, breathing treatments were administered, and patient was transferred to [facility] for admission.   Review of Systems:  All other systems reviewed and apart from HPI, are negative.  Past Medical History:  Diagnosis Date   Cancer [facility])    Hypertension    Renal disorder    CRF    Past Surgical History:  Procedure Laterality Date   AV FISTULA PLACEMENT Left 05/04/2019   Procedure: Arteriovenous (Av) Fistula Creation Left Arm;  Surgeon: Larina Earthly, MD;  Location: [facility] OR;  Service: Vascular;  Laterality: Left;   BIOPSY  08/10/2019   Procedure: BIOPSY;  Surgeon: Lynann Bologna, MD;  Location: [facility] ENDOSCOPY;  Service: Endoscopy;;   CHOLECYSTECTOMY     COLONOSCOPY N/A 08/10/2019   Procedure: COLONOSCOPY;  Surgeon: Lynann Bologna, MD;  Location: [facility] ENDOSCOPY;  Service: Endoscopy;  Laterality: N/A;   EYE SURGERY  Bilateral    cataract   LEFT HEART CATH AND CORONARY ANGIOGRAPHY N/A 06/19/2020   Procedure: LEFT HEART CATH AND CORONARY ANGIOGRAPHY;  Surgeon: Lyn Records, MD;  Location: [facility] INVASIVE CV LAB;  Service: Cardiovascular;  Laterality: N/A;   POLYPECTOMY  08/10/2019   Procedure: POLYPECTOMY;  Surgeon: Lynann Bologna, MD;  Location: [facility];  Service: Endoscopy;;    Social History:   reports that he quit smoking about 68 years ago. His smoking use included cigarettes. He quit smokeless tobacco use about 68 years ago. He reports that he does not drink alcohol and does not use drugs.  Allergies  Allergen Reactions   Amoxicillin-Pot Clavulanate Other (See Comments)    Did it involve swelling of the face/tongue/throat, SOB, or low BP? Unknown Did it involve sudden or severe rash/hives, skin peeling, or any reaction on the inside of your mouth or nose? Unknown Did you need to seek medical attention at a hospital or doctor's office? Unknown When did it last happen? patient is unfamiliar with this allergy   If all above answers are "NO", may proceed with cephalosporin use.    Atorvastatin     REACTION: arthralgia   Lovastatin     REACTION: arthralgia    Family History  Problem Relation Age of Onset   Liver disease Father    Kidney disease Father    Heart disease Father        before age 50  Heart disease Mother        before age 77     Prior to Admission medications   Medication Sig Start Date End Date Taking? Authorizing Provider  amLODipine (NORVASC) 10 MG tablet Take 10 mg by mouth daily.    [provider]  diclofenac Sodium (VOLTAREN) 1 % GEL Apply 2 g topically 4 (four) times daily as needed (joint pain). 08/31/21   [provider]  furosemide (LASIX) 40 MG tablet Take 40 mg by mouth daily. Prescribed by Dr Eliott Nine [facility]) since May 2016    [provider]  isosorbide mononitrate (IMDUR) 60 MG 24 hr tablet Take 1.5 tablets (90 mg total) by  mouth daily. 10/16/22   Azalee Course, PA  metoprolol tartrate (LOPRESSOR) 50 MG tablet Take 1 tablet (50 mg total) by mouth 2 (two) times daily. 08/01/21 10/16/22  Sande Rives, MD  nitroGLYCERIN (NITROSTAT) 0.4 MG SL tablet PLACE 1 TABLET UNDER THE TONGUE EVERY 5 MINUTES AS NEEDED FOR CHEST PAIN 10/16/21   Sande Rives, MD  rosuvastatin (CRESTOR) 40 MG tablet TAKE 1 TABLET BY MOUTH EVERY DAY 07/24/22   O'Neal, Ronnald Ramp, MD  tamsulosin (FLOMAX) 0.4 MG CAPS capsule Take 0.4 mg by mouth daily. 06/14/20   [provider]    Physical Exam: Vitals:   04/06/23 1815 04/06/23 1830 04/06/23 1845 04/06/23 2005  BP: 120/60 115/61 (!) 118/58 (!) 108/56  Pulse: 68 70 66 66  Resp: (!) 26 (!) 21 19 18   Temp:    (!) 97.5 F (36.4 C)  TempSrc:      SpO2: 98% 97% 97% 97%     Constitutional: NAD, no pallor or diaphoresis  Eyes: PERTLA, lids and conjunctivae normal ENMT: Mucous membranes are moist. Posterior pharynx clear of any exudate or lesions.   Neck: supple, no masses  Respiratory: no wheezing, no crackles. No accessory muscle use.  Cardiovascular: S1 & S2 heard, regular rate and rhythm. No extremity edema.  Abdomen: No distension, no tenderness, soft. Bowel sounds active.  Musculoskeletal: no clubbing / cyanosis. No joint deformity upper and lower extremities.   Skin: no significant rashes, lesions, ulcers. Warm, dry, well-perfused. Poor turgor.  Neurologic: CN 2-12 grossly intact. Moving all extremities. Alert and oriented.  Psychiatric: Pleasant. Cooperative.    Labs and Imaging on Admission: I have personally reviewed following labs and imaging studies  CBC: Recent Labs  Lab 04/06/23 1557  WBC 5.8  HGB 8.9*  HCT 25.7*  MCV 90.2  PLT 196   Basic Metabolic Panel: Recent Labs  Lab 04/06/23 1557  NA 127*  K 4.8  CL 97*  CO2 17*  GLUCOSE 86  BUN >100*  CREATININE 7.37*  CALCIUM 7.4*   GFR: CrCl cannot be calculated (Unknown ideal weight.). Liver  Function Tests: No results for input(s): "AST", "ALT", "ALKPHOS", "BILITOT", "PROT", "ALBUMIN" in the last 168 hours. No results for input(s): "LIPASE", "AMYLASE" in the last 168 hours. No results for input(s): "AMMONIA" in the last 168 hours. Coagulation Profile: No results for input(s): "INR", "PROTIME" in the last 168 hours. Cardiac Enzymes: No results for input(s): "CKTOTAL", "CKMB", "CKMBINDEX", "TROPONINI" in the last 168 hours. BNP (last 3 results) No results for input(s): "PROBNP" in the last 8760 hours. HbA1C: No results for input(s): "HGBA1C" in the last 72 hours. CBG: No results for input(s): "GLUCAP" in the last 168 hours. Lipid Profile: No results for input(s): "CHOL", "HDL", "LDLCALC", "TRIG", "CHOLHDL", "LDLDIRECT" in the last 72 hours. Thyroid Function Tests:  No results for input(s): "TSH", "T4TOTAL", "FREET4", "T3FREE", "THYROIDAB" in the last 72 hours. Anemia Panel: No results for input(s): "VITAMINB12", "FOLATE", "FERRITIN", "TIBC", "IRON", "RETICCTPCT" in the last 72 hours. Urine analysis:    Component Value Date/Time   COLORURINE YELLOW 09/27/2021 1730   APPEARANCEUR CLOUDY (A) 09/27/2021 1730   LABSPEC 1.012 09/27/2021 1730   PHURINE 5.0 09/27/2021 1730   GLUCOSEU NEGATIVE 09/27/2021 1730   HGBUR MODERATE (A) 09/27/2021 1730   BILIRUBINUR NEGATIVE 09/27/2021 1730   KETONESUR NEGATIVE 09/27/2021 1730   PROTEINUR 100 (A) 09/27/2021 1730   UROBILINOGEN 4.0 (H) 08/04/2015 0232   NITRITE NEGATIVE 09/27/2021 1730   LEUKOCYTESUR LARGE (A) 09/27/2021 1730   Sepsis Labs: @LABRCNTIP (procalcitonin:4,lacticidven:4) ) Recent Results (from the past 240 hour(s))  Resp panel by RT-PCR (RSV, Flu A&B, Covid) Anterior Nasal Swab     Status: None   Collection Time: 04/06/23  3:55 PM   Specimen: Anterior Nasal Swab  Result Value Ref Range Status   SARS Coronavirus 2 by RT PCR NEGATIVE NEGATIVE Final    Comment: (NOTE) SARS-CoV-2 target nucleic acids are NOT  DETECTED.  The SARS-CoV-2 RNA is generally detectable in upper respiratory specimens during the acute phase of infection. The lowest concentration of SARS-CoV-2 viral copies this assay can detect is 138 copies/mL. A negative result does not preclude SARS-Cov-2 infection and should not be used as the sole basis for treatment or other patient management decisions. A negative result may occur with  improper specimen collection/handling, submission of specimen other than nasopharyngeal swab, presence of viral mutation(s) within the areas targeted by this assay, and inadequate number of viral copies(<138 copies/mL). A negative result must be combined with clinical observations, patient history, and epidemiological information. The expected result is Negative.  Fact Sheet for Patients:  BloggerCourse.com  Fact Sheet for Healthcare Providers:  SeriousBroker.it  This test is no t yet approved or cleared by the [place] FDA and  has been authorized for detection and/or diagnosis of SARS-CoV-2 by FDA under an Emergency Use Authorization (EUA). This EUA will remain  in effect (meaning this test can be used) for the duration of the COVID-19 declaration under Section 564(b)(1) of the Act, 21 U.S.C.section 360bbb-3(b)(1), unless the authorization is terminated  or revoked sooner.       Influenza A by PCR NEGATIVE NEGATIVE Final   Influenza B by PCR NEGATIVE NEGATIVE Final    Comment: (NOTE) The Xpert Xpress SARS-CoV-2/FLU/RSV plus assay is intended as an aid in the diagnosis of influenza from Nasopharyngeal swab specimens and should not be used as a sole basis for treatment. Nasal washings and aspirates are unacceptable for Xpert Xpress SARS-CoV-2/FLU/RSV testing.  Fact Sheet for Patients: BloggerCourse.com  Fact Sheet for Healthcare Providers: SeriousBroker.it  This test is not yet  approved or cleared by the [place] FDA and has been authorized for detection and/or diagnosis of SARS-CoV-2 by FDA under an Emergency Use Authorization (EUA). This EUA will remain in effect (meaning this test can be used) for the duration of the COVID-19 declaration under Section 564(b)(1) of the Act, 21 U.S.C. section 360bbb-3(b)(1), unless the authorization is terminated or revoked.     Resp Syncytial Virus by PCR NEGATIVE NEGATIVE Final    Comment: (NOTE) Fact Sheet for Patients: BloggerCourse.com  Fact Sheet for Healthcare Providers: SeriousBroker.it  This test is not yet approved or cleared by the [place] FDA and has been authorized for detection and/or diagnosis of SARS-CoV-2 by FDA under an Emergency Use Authorization (EUA). This  EUA will remain in effect (meaning this test can be used) for the duration of the COVID-19 declaration under Section 564(b)(1) of the Act, 21 U.S.C. section 360bbb-3(b)(1), unless the authorization is terminated or revoked.  Performed at [facility], [address] [place], [place] [address]      Radiological Exams on Admission: DG Chest 2 View  Result Date: 04/06/2023 CLINICAL DATA:  Shortness of breath EXAM: CHEST - 2 VIEW COMPARISON:  None Available. FINDINGS: The heart size and mediastinal contours are within normal limits. Both lungs are clear. The visualized skeletal structures are unremarkable. Prominent appearance of the costochondral margins again noted IMPRESSION: No active cardiopulmonary disease. Electronically Signed   By: Deatra Robinson M.D.   On: 04/06/2023 16:28    EKG: Independently reviewed. Sinus rhythm, 1st degree AV block.   Assessment/Plan   1. AKI superimposed on CKD V; uremia; acidosis  - Appreciate nephrology consultation, planning to start dialysis this admission  - Avoid nephrotoxins, renally-dose medications, monitor strict I/Os      2. Hyponatremia  - Serum sodium is 127 in setting of renal failure and hypovolemia   - To be followed closely with HD   3. Weakness; debility; falls  - No focal deficits  - Anticipate some improvement with dialysis, will use fall precautions and consult PT    4. CAD  - No anginal complaints  - Continue ASA, statin, nitrates    5. Anemia  - Hgb is 8.9 on admission, down from 9.5 on 04/03/23  - No overt bleeding, likely d/t advanced CKD  - Nephrology checking iron studies and ESA, transfuse if Hgb <7  6. Hypertension  - BP as low as 89/50 in ED and antihypertensives held on admission    DVT prophylaxis: sq heparin  Code Status: Full  Level of Care: Level of care: Telemetry Medical Family Communication: Daughter at bedside  Disposition Plan:  Patient is from: Home  Anticipated d/c is to: TBD Anticipated d/c date is: 04/10/23  Patient currently: Preparing to start dialysis this hospitalization, will need PT eval   Consults called: Nephrology  Admission status: Inpatient     Briscoe Deutscher, MD [facility]  04/06/2023, 8:46 PM

## 2023-04-07 DIAGNOSIS — N185 Chronic kidney disease, stage 5: Secondary | ICD-10-CM | POA: Diagnosis not present

## 2023-04-07 DIAGNOSIS — E43 Unspecified severe protein-calorie malnutrition: Secondary | ICD-10-CM | POA: Insufficient documentation

## 2023-04-07 DIAGNOSIS — N179 Acute kidney failure, unspecified: Secondary | ICD-10-CM | POA: Diagnosis not present

## 2023-04-07 LAB — BASIC METABOLIC PANEL
Anion gap: 13 (ref 5–15)
Anion gap: 16 — ABNORMAL HIGH (ref 5–15)
BUN: 123 mg/dL — ABNORMAL HIGH (ref 8–23)
BUN: 122 mg/dL — ABNORMAL HIGH (ref 8–23)
CO2: 17 mmol/L — ABNORMAL LOW (ref 22–32)
CO2: 15 mmol/L — ABNORMAL LOW (ref 22–32)
Calcium: 7.4 mg/dL — ABNORMAL LOW (ref 8.9–10.3)
Calcium: 7.6 mg/dL — ABNORMAL LOW (ref 8.9–10.3)
Chloride: 97 mmol/L — ABNORMAL LOW (ref 98–111)
Chloride: 97 mmol/L — ABNORMAL LOW (ref 98–111)
Creatinine, Ser: 7.37 mg/dL — ABNORMAL HIGH (ref 0.61–1.24)
Creatinine, Ser: 7.32 mg/dL — ABNORMAL HIGH (ref 0.61–1.24)
GFR, Estimated: 7 mL/min — ABNORMAL LOW (ref >60)
GFR, Estimated: 7 mL/min — ABNORMAL LOW (ref >60)
Glucose, Bld: 86 mg/dL (ref 70–99)
Glucose, Bld: 73 mg/dL (ref 70–99)
Potassium: 4.8 mmol/L (ref 3.5–5.1)
Potassium: 4.8 mmol/L (ref 3.5–5.1)
Sodium: 127 mmol/L — ABNORMAL LOW (ref 135–145)
Sodium: 128 mmol/L — ABNORMAL LOW (ref 135–145)

## 2023-04-07 LAB — IRON AND TIBC
Iron: 41 ug/dL — ABNORMAL LOW (ref 45–182)
Saturation Ratios: 23 % (ref 17.9–39.5)
TIBC: 176 ug/dL — ABNORMAL LOW (ref 250–450)
UIBC: 135 ug/dL

## 2023-04-07 LAB — CBC
HCT: 26 % — ABNORMAL LOW (ref 39.0–52.0)
Hemoglobin: 8.7 g/dL — ABNORMAL LOW (ref 13.0–17.0)
MCH: 30.4 pg (ref 26.0–34.0)
MCHC: 33.5 g/dL (ref 30.0–36.0)
MCV: 90.9 fL (ref 80.0–100.0)
Platelets: 169 10*3/uL (ref 150–400)
RBC: 2.86 MIL/uL — ABNORMAL LOW (ref 4.22–5.81)
RDW: 13.6 % (ref 11.5–15.5)
WBC: 4.7 10*3/uL (ref 4.0–10.5)
nRBC: 0 % (ref 0.0–0.2)

## 2023-04-07 LAB — HEPATITIS B SURFACE ANTIGEN: Hepatitis B Surface Ag: NONREACTIVE

## 2023-04-07 LAB — PHOSPHORUS: Phosphorus: 7.1 mg/dL — ABNORMAL HIGH (ref 2.5–4.6)

## 2023-04-07 LAB — GLUCOSE, CAPILLARY
Glucose-Capillary: 63 mg/dL — ABNORMAL LOW (ref 70–99)
Glucose-Capillary: 90 mg/dL (ref 70–99)

## 2023-04-07 MED ORDER — RENA-VITE PO TABS
1.0000 | ORAL_TABLET | Freq: Every day | ORAL | Status: DC
  Administered 2023-04-07 – 2023-04-09 (×3): 1 via ORAL
  Filled 2023-04-07 (×3): qty 1

## 2023-04-07 MED ORDER — SODIUM CHLORIDE 0.9 % IV SOLN
125.0000 mg | Freq: Every day | INTRAVENOUS | Status: DC
  Administered 2023-04-07 – 2023-04-08 (×2): 125 mg via INTRAVENOUS
  Filled 2023-04-07: qty 125
  Filled 2023-04-07 (×4): qty 10

## 2023-04-07 MED ORDER — NEPRO/CARBSTEADY PO LIQD
237.0000 mL | Freq: Two times a day (BID) | ORAL | Status: DC
  Administered 2023-04-07 – 2023-04-10 (×5): 237 mL via ORAL

## 2023-04-07 MED ORDER — DARBEPOETIN ALFA 60 MCG/0.3ML IJ SOSY
60.0000 ug | PREFILLED_SYRINGE | INTRAMUSCULAR | Status: DC
  Administered 2023-04-07: 60 ug via SUBCUTANEOUS
  Filled 2023-04-07: qty 1.26
  Filled 2023-04-07 (×2): qty 0.3

## 2023-04-07 MED ORDER — METOPROLOL TARTRATE 12.5 MG HALF TABLET
12.5000 mg | ORAL_TABLET | Freq: Two times a day (BID) | ORAL | Status: DC
  Administered 2023-04-07 – 2023-04-08 (×3): 12.5 mg via ORAL
  Filled 2023-04-07 (×3): qty 1

## 2023-04-07 MED ORDER — CHLORHEXIDINE GLUCONATE CLOTH 2 % EX PADS
6.0000 | MEDICATED_PAD | Freq: Every day | CUTANEOUS | Status: DC
Start: 2023-04-08 — End: 2023-04-07

## 2023-04-07 MED ORDER — GUAIFENESIN ER 600 MG PO TB12
600.0000 mg | ORAL_TABLET | Freq: Two times a day (BID) | ORAL | Status: DC
  Administered 2023-04-07 – 2023-04-10 (×6): 600 mg via ORAL
  Filled 2023-04-07 (×7): qty 1

## 2023-04-07 MED ORDER — BENZONATATE 100 MG PO CAPS
100.0000 mg | ORAL_CAPSULE | Freq: Three times a day (TID) | ORAL | Status: DC
  Administered 2023-04-07: 100 mg via ORAL
  Filled 2023-04-07: qty 1

## 2023-04-07 NOTE — Progress Notes (Signed)
Requested to see pt for out-pt HD needs at d/c. Pt currently in HD and attempted to reach pt's daughter but had to leave a message requesting a return call. MD states pt is requesting [facility]. Referral submitted to [facility] for review. Also contacted [facility] staff to be advised that pt may need assistance with transportation to/from HD per renal note from 6/3. Will await return call from pt's daughter and assist accordingly.   Olivia Canter Renal Navigator [phone]

## 2023-04-07 NOTE — Consult Note (Addendum)
Hospital Consult    Reason for Consult:  Fistulogram Requesting Physician:  Dr. Ronalee Belts MRN #:  [id number]  History of Present Illness: This is a 89 y.o. male with CKD Stage V now felt to be ESRD presenting with uremic symptoms. Vascular surgery consulted for evaluation for possible need for Fistulogram prior to cannulation. He has a left radiocephalic AV fistula that was created on 05/04/19 by Dr. Arbie Cookey. This has not yet been used for Hemodialysis access. He was seen in our office in April for evaluation of his access in anticipation of initiation of dialysis. He had a duplex at that time that showed that fistula had some stenosis in distal forearm and some competing branches but patent with adequate flow volume. He is without any signs or symptoms of steal.   Past Medical History:  Diagnosis Date   Cancer (HCC)    Hypertension    Renal disorder    CRF    Past Surgical History:  Procedure Laterality Date   AV FISTULA PLACEMENT Left 05/04/2019   Procedure: Arteriovenous (Av) Fistula Creation Left Arm;  Surgeon: Larina Earthly, MD;  Location: [facility] OR;  Service: Vascular;  Laterality: Left;   BIOPSY  08/10/2019   Procedure: BIOPSY;  Surgeon: Lynann Bologna, MD;  Location: [facility];  Service: Endoscopy;;   CHOLECYSTECTOMY     COLONOSCOPY N/A 08/10/2019   Procedure: COLONOSCOPY;  Surgeon: Lynann Bologna, MD;  Location: [facility];  Service: Endoscopy;  Laterality: N/A;   EYE SURGERY Bilateral    cataract   LEFT HEART CATH AND CORONARY ANGIOGRAPHY N/A 06/19/2020   Procedure: LEFT HEART CATH AND CORONARY ANGIOGRAPHY;  Surgeon: Lyn Records, MD;  [facility] LAB;  Service: Cardiovascular;  Laterality: N/A;   POLYPECTOMY  08/10/2019   Procedure: POLYPECTOMY;  Surgeon: Lynann Bologna, MD;  Location: [facility];  Service: Endoscopy;;    Allergies  Allergen Reactions   Amoxicillin-Pot Clavulanate Other (See Comments)    Did it involve swelling of the face/tongue/throat, SOB, or  low BP? Unknown Did it involve sudden or severe rash/hives, skin peeling, or any reaction on the inside of your mouth or nose? Unknown Did you need to seek medical attention at a hospital or doctor's office? Unknown When did it last happen? patient is unfamiliar with this allergy   If all above answers are "NO", may proceed with cephalosporin use.    Atorvastatin     REACTION: arthralgia   Lovastatin     REACTION: arthralgia    Prior to Admission medications   Medication Sig Start Date End Date Taking? Authorizing Provider  amLODipine (NORVASC) 10 MG tablet Take 10 mg by mouth daily.    [provider]  diclofenac Sodium (VOLTAREN) 1 % GEL Apply 2 g topically 4 (four) times daily as needed (joint pain). 08/31/21   [provider]  furosemide (LASIX) 40 MG tablet Take 40 mg by mouth daily. Prescribed by Dr Eliott Nine [facility]) since May 2016    [provider]  isosorbide mononitrate (IMDUR) 60 MG 24 hr tablet Take 1.5 tablets (90 mg total) by mouth daily. 10/16/22   Azalee Course, PA  metoprolol tartrate (LOPRESSOR) 50 MG tablet Take 1 tablet (50 mg total) by mouth 2 (two) times daily. 08/01/21 10/16/22  Sande Rives, MD  nitroGLYCERIN (NITROSTAT) 0.4 MG SL tablet PLACE 1 TABLET UNDER THE TONGUE EVERY 5 MINUTES AS NEEDED FOR CHEST PAIN 10/16/21   O'Neal, Ronnald Ramp, MD  rosuvastatin (CRESTOR) 40 MG tablet TAKE  1 TABLET BY MOUTH EVERY DAY 07/24/22   O'Neal, Ronnald Ramp, MD  tamsulosin (FLOMAX) 0.4 MG CAPS capsule Take 0.4 mg by mouth daily. 06/14/20   [provider]    Social History   Socioeconomic History   Marital status: Married    Spouse name: Not on file   Number of children: Not on file   Years of education: Not on file   Highest education level: Not on file  Occupational History   Not on file  Tobacco Use   Smoking status: Former    Types: Cigarettes    Quit date: 20    Years since quitting: 68.4   Smokeless tobacco:  Former    Quit date: 11/03/1954  Vaping Use   Vaping Use: Never used  Substance and Sexual Activity   Alcohol use: No    Alcohol/week: 0.0 standard drinks of alcohol   Drug use: No   Sexual activity: Not on file  Other Topics Concern   Not on file  Social History Narrative   From [place]   Moved to [place] 1968   Moved to [place] 2006   5 daughters   Social Determinants of Corporate investment banker Strain: Not on file  Food Insecurity: No Food Insecurity (04/06/2023)   Hunger Vital Sign    Worried About Running Out of Food in the Last Year: Never true    Ran Out of Food in the Last Year: Never true  Transportation Needs: No Transportation Needs (04/06/2023)   PRAPARE - Administrator, Civil Service (Medical): No    Lack of Transportation (Non-Medical): No  Physical Activity: Not on file  Stress: Not on file  Social Connections: Not on file  Intimate Partner Violence: Unknown (04/06/2023)   Humiliation, Afraid, Rape, and Kick questionnaire    Fear of Current or Ex-Partner: No    Emotionally Abused: No    Physically Abused: Not on file    Sexually Abused: No     Family History  Problem Relation Age of Onset   Liver disease Father    Kidney disease Father    Heart disease Father        before age 24   Heart disease Mother        before age 38    ROS: Otherwise negative unless mentioned in HPI  Physical Examination  Vitals:   04/07/23 0518 04/07/23 0726  BP: 120/63 116/62  Pulse: 75 78  Resp: 18 16  Temp: 97.7 F (36.5 C) 97.9 F (36.6 C)  SpO2: 96% 96%   Body mass index is 19.19 kg/m.  General:  WDWN in NAD Gait: Not observed HENT: WNL, normocephalic Pulmonary: normal non-labored breathing, without wheezing Cardiac: regular Vascular Exam/Pulses: 2+ left radial, hand warm and well perfused Extremities: left radiocephalic AV fistula with good thrill. There are some large collateral veins present Musculoskeletal: no muscle wasting or  atrophy  Neurologic: A&O X 3;  No focal weakness or paresthesias are detected; speech is fluent/normal Psychiatric:  The pt has Normal affect.  CBC    Component Value Date/Time   WBC 4.7 04/07/2023 0454   RBC 2.86 (L) 04/07/2023 0454   HGB 8.7 (L) 04/07/2023 0454   HGB 9.8 (L) 10/18/2021 0808   HCT 26.0 (L) 04/07/2023 0454   HCT 30.6 (L) 10/18/2021 0808   PLT 169 04/07/2023 0454   PLT 182 10/18/2021 0808   MCV 90.9 04/07/2023 0454   MCV 95 10/18/2021 0808   MCH  30.4 04/07/2023 0454   MCHC 33.5 04/07/2023 0454   RDW 13.6 04/07/2023 0454   RDW 13.2 10/18/2021 0808   LYMPHSABS 1.1 09/27/2021 1423   MONOABS 1.3 (H) 09/27/2021 1423   EOSABS 0.0 09/27/2021 1423   BASOSABS 0.0 09/27/2021 1423    BMET    Component Value Date/Time   NA 128 (L) 04/07/2023 0454   NA 139 06/15/2020 0954   K 4.8 04/07/2023 0454   CL 97 (L) 04/07/2023 0454   CO2 15 (L) 04/07/2023 0454   GLUCOSE 73 04/07/2023 0454   BUN 122 (H) 04/07/2023 0454   BUN 84 (HH) 06/15/2020 0954   CREATININE 7.32 (H) 04/07/2023 0454   CALCIUM 7.6 (L) 04/07/2023 0454   GFRNONAA 7 (L) 04/07/2023 0454   GFRAA 13 (L) 06/15/2020 0954    COAGS: Lab Results  Component Value Date   INR 1.2 08/31/2019   INR 1.25 08/03/2015   INR 1.31 01/22/2010     Non-Invasive Vascular Imaging:   Findings:  +--------------------+----------+-----------------+--------+  AVF                PSV (cm/s)Flow Vol (mL/min)Comments  +--------------------+----------+-----------------+--------+  Native artery inflow   345          2742                 +--------------------+----------+-----------------+--------+  AVF Anastomosis        443                               +--------------------+----------+-----------------+--------+     +------------+-------------------+-------------+----------+----------------  ----+  OUTFLOW VEIN    PSV (cm/s)     Diameter (cm)Depth (cm)      Describe          +------------+-------------------+-------------+----------+----------------  ----+  Shoulder           78             0.43        0.20                          +------------+-------------------+-------------+----------+----------------  ----+  Prox UA             188            0.46        0.19                          +------------+-------------------+-------------+----------+----------------  ----+  Mid UA              74             0.52        0.18                          +------------+-------------------+-------------+----------+----------------  ----+  Dist UA             73             0.56        0.16                          +------------+-------------------+-------------+----------+----------------  ----+  AC Fossa            170            0.61  0.26     Competing  branch                                                            measuring  0.43cm    +------------+-------------------+-------------+----------+----------------  ----+  Prox ForearmVelocity image did     0.63        0.16                                           not save                                                    +------------+-------------------+-------------+----------+----------------  ----+  Mid Forearm         137            1.01        0.15     Competing  branch                                                             measuring  0.48     +------------+-------------------+-------------+----------+----------------  ----+  Dist Forearm        691            1.02        0.16         stenotic         +------------+-------------------+-------------+----------+----------------  ----+   Summary:  Patent arteriovenous fistula. Arteriovenous fistula-Velocities less than 100cm/s noted.Arteriovenous fistula-Stenosis noted in the outflow vein just distal to  the anastomosis. Multiple competing branches as described above.     Statin:  Yes.   Beta Blocker:  Yes Aspirin:  No. ACEI:  No. ARB:  No. CCB use:  Yes Other antiplatelets/anticoagulants:  No.    ASSESSMENT/PLAN: This is a 89 y.o. male now ESRD. Has a patent left radiocephalic AV fistula. There is some concern about there being stenosis in fistula based on recent duplex in April, however clinically fistula has great thrill and appears that it should be adequate to cannulate. Recommend attempting to cannulate fistula today. If there are any issues please let us know and we can arrange fistulogram    Graceann Congress PA-C [facility] [phone] 04/07/2023  10:16 AM  I have interviewed the patient and examined the patient. I agree with the findings by the PA. The forearm AVF has an excellent thrill. Would try to use it first before jumping to a fistulogram.   Cari Caraway, MD

## 2023-04-07 NOTE — Progress Notes (Signed)
This RN was called over by the DT, she observed that the pt infiltration. I went to assess and the pts access was now swollen where he was cannulated. Blood rinsed back pt had 1 hour of tx remaining. Ice placed on arm, pt educated. Dr. Ronalee Belts notified.

## 2023-04-07 NOTE — Progress Notes (Signed)
**Note Pedro-Identified via Obfuscation** PROGRESS NOTE  Pedro Young  DOB: February 25, 1933  PCP: Pedro Flakes, FNP MWU:[id number]  DOA: 04/06/2023  LOS: 1 day  Hospital Day: 2  Brief narrative: Pedro Young is a 89 y.o. male with PMH significant fo HTN, HLD, CAD, and CKD V not yet on dialysis with an AV fistula ready on left upper extremity. Patient has progressively worsening oral intake, generalized weakness and has had frequent falls, fell about 3 times in the last 2 weeks.  Recently also developed nonproductive cough, shortness of breath, wheezing - chest x-ray patient was clear.  Family also reported involuntary jerking, unsteadiness upon standing and generalized weakness. 6/3, patient was found on the floor, no LOC per her sister, separately brought to ED at [facility].  In the ED, patient was afebrile, heart rate in 60s, blood pressure initially was low at 89/50, breathing on room air. Initial labs with WC count 5.8, hemoglobin 8.9, sodium low at 127, BUN more than 100, creatinine 7.37, serum bicarb low at 17, BNP 665 EDP discussed with nephrology Admitted to [facility]  Of note, patient follows up with nephrology Pedro Young as an outpatient and has been putting off dialysis for the last year.  His wife is in hospice and he did not want to pursue dialysis if she passed away.  Subjective: Patient was seen and examined this morning. Pleasant elderly male.  Lying on bed.  Not in distress.  Daughter at bedside. Chart reviewed.  Remains afebrile, heart rate in 70s, blood pressure in low normal range this morning, breathing on room air  Assessment and plan: AKI on CKD V Acute on chronic metabolic acidosis Uremia Presented with progressive worsening shortness of breath, wheezing, cough and falls  Elevated creatinine noted-probably end-stage renal disease now Showing signs of uremia with weakness, anorexia, myoclonic jerking Noted a plan to initiate dialysis this hospitalization Outpatient dialysis set up  to be arranged. Patient already left upper extremity AV fistula in place but per nephrology note, recent duplex showed high-grade stenosis.  [facility]ak is to be consulted for potential need of fistulogram.  Hyponatremia  Serum sodium is 127 in setting of renal failure and hypovolemia   To be initiated on dialysis Recent Labs  Lab 04/06/23 1557 04/07/23 0454  NA 127* 128*   Hypotension H/o hypertension Blood pressure on lower side of normal.  PTA on metoprolol 50 mg twice daily, amlodipine 10 mg daily, Lasix PRN.  Currently on hold I will resume metoprolol at 12.5 mg twice daily at this time. Continue monitor.  CAD/HLD No anginal symptoms. Continue aspirin, statin.  Anemia of chronic disease Hemoglobin slightly low, close to 10 at baseline.  Likely due to CKD. No overt bleeding. Continue monitor.  Transfuse if less than 7. Recent Labs    04/21/22 1030 04/06/23 1557 04/07/23 0454  HGB 10.5* 8.9* 8.7*  MCV 97.3 90.2 90.9  TIBC  --   --  176*  IRON  --   --  41*   BPH Flomax  Generalized weakness PT eval  Goals of care   Code Status: Full Code     DVT prophylaxis:  heparin injection 5,000 Units Start: 04/06/23 2200   Antimicrobials: None Fluid: None Consultants: Nephrology Family Communication: Daughter at bedside  Status: Inpatient Level of care:  Telemetry Medical   Patient from: Home Anticipated d/c to: Clinical course Needs to continue in-hospital care:  Plan to initiate dialysis in the hospital       Diet:  Diet Order  Diet renal with fluid restriction Fluid restriction: 1200 mL Fluid; Room service appropriate? Yes; Fluid consistency: Thin  Diet effective now                   Scheduled Meds:  Chlorhexidine Gluconate Cloth  6 each Topical Q0600   guaiFENesin  600 mg Oral BID   heparin  5,000 Units Subcutaneous Q8H   isosorbide mononitrate  90 mg Oral Daily   metoprolol tartrate  12.5 mg Oral BID   rosuvastatin  10 mg Oral  Daily   sodium chloride flush  3 mL Intravenous Q12H   tamsulosin  0.4 mg Oral Daily    PRN meds: acetaminophen **OR** acetaminophen, ondansetron **OR** ondansetron (ZOFRAN) IV, senna   Infusions:    Antimicrobials: Anti-infectives (From admission, onward)    None       Nutritional status:  Body mass index is 19.19 kg/m.          Objective: Vitals:   04/07/23 0518 04/07/23 0726  BP: 120/63 116/62  Pulse: 75 78  Resp: 18 16  Temp: 97.7 F (36.5 C) 97.9 F (36.6 C)  SpO2: 96% 96%    Intake/Output Summary (Last 24 hours) at 04/07/2023 1037 Last data filed at 04/06/2023 2200 Gross per 24 hour  Intake 60 ml  Output --  Net 60 ml   Filed Weights   04/07/23 0039  Weight: 52.3 kg   Weight change:  Body mass index is 19.19 kg/m.   Physical Exam: General exam: Pleasant, elderly.  Not in distress Skin: No rashes, lesions or ulcers. HEENT: Atraumatic, normocephalic, no obvious bleeding Lungs: Clear to auscultation bilaterally CVS: Regular rate and rhythm, no murmur GI/Abd soft, nontender, nondistended, bowel sound present CNS: Alert, awake, oriented x 3 Psychiatry: Mood appropriate Extremities: No pedal edema, no calf tenderness.  Left upper extremity with AV fistula  Data Review: I have personally reviewed the laboratory data and studies available.  F/u labs ordered Unresulted Labs (From admission, onward)     Start     Ordered   04/07/23 0500  Basic metabolic panel  Daily,   R     Question:  Specimen collection method  Answer:  Lab=Lab collect   04/06/23 2027   04/07/23 0500  CBC  Daily,   R     Question:  Specimen collection method  Answer:  Lab=Lab collect   04/06/23 2027   04/07/23 0500  PTH, intact and calcium  Tomorrow morning,   R       Question:  Specimen collection method  Answer:  Lab=Lab collect   04/06/23 2059   04/07/23 0500  Hepatitis B surface antibody,quantitative  Once,   R        04/07/23 0500   Signed and Held  Renal function  panel  Once,   R       Question:  Specimen collection method  Answer:  Lab=Lab collect   Signed and Held   Signed and Held  CBC  Once,   R       Question:  Specimen collection method  Answer:  Lab=Lab collect   Signed and Held            Total time spent in review of labs and imaging, patient evaluation, formulation of plan, documentation and communication with family: 55 minutes  Signed, Lorin Glass, MD [facility] 04/07/2023

## 2023-04-07 NOTE — Evaluation (Signed)
Physical Therapy Evaluation Patient Details Name: Pedro Young MRN: [id number] DOB: September 10, 1933 Today's Date: 04/07/2023  History of Present Illness  89 yo male presents to ED on 6/3 with fall, weakness, SOB. PMH includes HTN, CKD, preDM, CKD V with HD cath placed but not yet on HD.   Clinical Impression  Pt presents with generalized weakness, impaired balance requiring AD use at this time, impaired gait, and decreased activity tolerance. Pt to benefit from acute PT to address deficits. Pt ambulated short hallway distance with RW, requiring x1 standing rest break to recover muscular fatigue and min steadying assist. Pt expresses desire to d/c home to wife who is on hospice, pt's daughter states she or her sister will be there to assist as needed but he will need to improve mobility-wise prior to d/c. Pt's daughter states pt needs hospital bed.  PT to progress mobility as tolerated, and will continue to follow acutely.         Recommendations for follow up therapy are one component of a multi-disciplinary discharge planning process, led by the attending physician.  Recommendations may be updated based on patient status, additional functional criteria and insurance authorization.  Follow Up Recommendations       Assistance Recommended at Discharge Frequent or constant Supervision/Assistance  Patient can return home with the following  A little help with walking and/or transfers;A little help with bathing/dressing/bathroom    Equipment Recommendations Hospital bed  Recommendations for Other Services       Functional Status Assessment Patient has had a recent decline in their functional status and demonstrates the ability to make significant improvements in function in a reasonable and predictable amount of time.     Precautions / Restrictions Precautions Precautions: Fall Restrictions Weight Bearing Restrictions: No      Mobility  Bed Mobility Overal bed mobility: Needs  Assistance Bed Mobility: Supine to Sit, Sit to Supine     Supine to sit: Min assist, HOB elevated Sit to supine: Min assist, HOB elevated   General bed mobility comments: for trunk elevation, repositioning in bed    Transfers Overall transfer level: Needs assistance Equipment used: Rolling walker (2 wheels) Transfers: Sit to/from Stand Sit to Stand: Min assist           General transfer comment: assist for initial power up and steadying once standing, stand x2 from EOB once with and once without RW    Ambulation/Gait Ambulation/Gait assistance: Min assist Gait Distance (Feet): 80 Feet Assistive device: Rolling walker (2 wheels) Gait Pattern/deviations: Step-through pattern, Decreased stride length, Trunk flexed, Drifts right/left Gait velocity: decr     General Gait Details: assist to steady, guide pt trajectory in hallway. slowed gait, x1 standing rest break to recover fatigue  Stairs            Wheelchair Mobility    Modified Rankin (Stroke Patients Only)       Balance Overall balance assessment: Needs assistance Sitting-balance support: No upper extremity supported, Feet supported Sitting balance-Leahy Scale: Fair     Standing balance support: Bilateral upper extremity supported, During functional activity Standing balance-Leahy Scale: Poor Standing balance comment: attempted gait without support, unable given imbalance and myoclonic jerking                             Pertinent Vitals/Pain Pain Assessment Pain Assessment: Faces Faces Pain Scale: No hurt Pain Intervention(s): Monitored during session    Home Living Family/patient expects to be  discharged to:: Private residence Living Arrangements: Spouse/significant other Available Help at Discharge: Family Type of Home: House Home Access: Stairs to enter   Secretary/administrator of Steps: 1   Home Layout: One level Home Equipment: Cane - single Librarian, academic (2  wheels);Wheelchair - manual Additional Comments: equipment is his wife's, but she is not currently using    Prior Function Prior Level of Function : Independent/Modified Independent             Mobility Comments: up until the last 3 days, pt ambulatory without AD ADLs Comments: until the last 3 days, pt independent with ADLs. Per pt's daughter, pt has required assist recently     Hand Dominance   Dominant Hand: Right    Extremity/Trunk Assessment   Upper Extremity Assessment Upper Extremity Assessment: Defer to OT evaluation    Lower Extremity Assessment Lower Extremity Assessment: Generalized weakness (formal MMT limited by myoclonic jerking)    Cervical / Trunk Assessment Cervical / Trunk Assessment: Normal  Communication   Communication: No difficulties  Cognition Arousal/Alertness: Awake/alert Behavior During Therapy: WFL for tasks assessed/performed Overall Cognitive Status: Impaired/Different from baseline Area of Impairment: Orientation, Following commands, Safety/judgement, Problem solving                 Orientation Level: Disoriented to, Place, Situation     Following Commands: Follows one step commands with increased time Safety/Judgement: Decreased awareness of deficits   Problem Solving: Difficulty sequencing, Requires verbal cues, Requires tactile cues General Comments: pt states "wow, I am in the hospital, I thought I was at home" when entering hallway. Increased time to follow commands and answer questions        General Comments      Exercises     Assessment/Plan    PT Assessment Patient needs continued PT services  PT Problem List Decreased strength;Decreased mobility;Decreased activity tolerance;Decreased knowledge of use of DME;Decreased balance;Decreased cognition;Decreased knowledge of precautions;Decreased safety awareness       PT Treatment Interventions DME instruction;Therapeutic activities;Gait training;Therapeutic  exercise;Patient/family education;Balance training;Stair training;Functional mobility training;Neuromuscular re-education    PT Goals (Current goals can be found in the Care Plan section)  Acute Rehab PT Goals PT Goal Formulation: With patient Time For Goal Achievement: 04/21/23 Potential to Achieve Goals: Good    Frequency Min 4X/week     Co-evaluation               AM-PAC PT "6 Clicks" Mobility  Outcome Measure Help needed turning from your back to your side while in a flat bed without using bedrails?: A Little Help needed moving from lying on your back to sitting on the side of a flat bed without using bedrails?: A Little Help needed moving to and from a bed to a chair (including a wheelchair)?: A Little Help needed standing up from a chair using your arms (e.g., wheelchair or bedside chair)?: A Little Help needed to walk in hospital room?: A Little Help needed climbing 3-5 steps with a railing? : A Lot 6 Click Score: 17    End of Session Equipment Utilized During Treatment: Gait belt Activity Tolerance: Patient tolerated treatment well Patient left: in bed;with call bell/phone within reach;with bed alarm set;with family/visitor present Nurse Communication: Mobility status PT Visit Diagnosis: Other abnormalities of gait and mobility (R26.89)    Time: 0981-1914 PT Time Calculation (min) (ACUTE ONLY): 29 min   Charges:   PT Evaluation $PT Eval Low Complexity: 1 Low PT Treatments $Gait Training: 8-22 mins  Marye Round, PT DPT Acute Rehabilitation Services Secure Chat Preferred  Office [phone]   Truddie Coco 04/07/2023, 1:27 PM

## 2023-04-07 NOTE — Progress Notes (Addendum)
Initial Nutrition Assessment  DOCUMENTATION CODES:   Severe malnutrition in context of chronic illness  INTERVENTION:  - Add Nepro Shake po BID, each supplement provides 425 kcal and 19 grams protein  - Add Renal MVI q day.   NUTRITION DIAGNOSIS:   Severe Malnutrition related to chronic illness as evidenced by severe fat depletion, severe muscle depletion, energy intake < 75% for > or equal to 1 month.  GOAL:   Patient will meet greater than or equal to 90% of their needs  MONITOR:   PO intake, Supplement acceptance  REASON FOR ASSESSMENT:   Malnutrition Screening Tool    ASSESSMENT:   89 y.o. male admits related to general weakness and falls. PMH includes: HTN, HLD, CAD, and CKD stage 5. Pt is currently receiving medical management related to AKI superimposed on CKD stage 5.  Meds reviewed. Labs reviewed: na low, BUN/creatinine high, phos high.   The pt reports that he has had ongoing poor appetite for the past 6 weeks. He states that he has lost about 12 lbs over the past 6 weeks. No significant wt loss per record, however pt with significant muscle and fat wasting. Pt is planning to start HD while inpatient. Pt's family member at bedside was curious about the diet that the pt needed to follow upon discharge. RD educated patient and family member on the pt's need to focus on caloric intake and adequate protein versus restriction. Pt agreed to try Nepro BID. RD will add supplements and continue to closely monitor PO intakes.   NUTRITION - FOCUSED PHYSICAL EXAM:  Flowsheet Row Most Recent Value  Orbital Region Severe depletion  Upper Arm Region Severe depletion  Thoracic and Lumbar Region Unable to assess  Buccal Region Moderate depletion  Temple Region Severe depletion  Clavicle Bone Region Severe depletion  Clavicle and Acromion Bone Region Severe depletion  Scapular Bone Region Unable to assess  Dorsal Hand Moderate depletion  Patellar Region Severe depletion   Anterior Thigh Region Severe depletion  Posterior Calf Region Severe depletion  Edema (RD Assessment) None  Hair Reviewed  Eyes Reviewed  Mouth Reviewed  Skin Reviewed  Nails Reviewed       Diet Order:   Diet Order             Diet renal with fluid restriction Fluid restriction: 1200 mL Fluid; Room service appropriate? Yes; Fluid consistency: Thin  Diet effective now                   EDUCATION NEEDS:   Not appropriate for education at this time  Skin:  Skin Assessment: Reviewed RN Assessment  Last BM:  PTA  Height:   Ht Readings from Last 1 Encounters:  02/16/23 5\' 5"  (1.651 m)    Weight:   Wt Readings from Last 1 Encounters:  04/07/23 52.3 kg    Ideal Body Weight:     BMI:  Body mass index is 19.19 kg/m.  Estimated Nutritional Needs:   Kcal:  1300-1600 kcals  Protein:  65-80 gm  Fluid:  >/= 1.3 L  Bethann Humble, RD, LDN, CNSC.

## 2023-04-07 NOTE — Progress Notes (Signed)
Pt was cannulated x2 by DT with 17 gauge needles and infiltrated. Ice placed on access pt recannulated by this RN above infiltration with 17 gauge x2 retrograde arterial down venous up due to space available for cannulation. Tx started and running as prescribed. Pressures WDL. Pedro Buckler, NP made away. Spoke with Dr.Bhandari and he was made aware.

## 2023-04-07 NOTE — Progress Notes (Signed)
Subjective:  No acute complaints this morning. Patient and his daughter at bedside do have several questions regarding OP dialysis, scheduling, transportation, dietary recommendations, etc. They are hopeful that his symptoms will improve with initiation of HD.  Objective Vital signs in last 24 hours: Vitals:   04/07/23 0039 04/07/23 0112 04/07/23 0518 04/07/23 0726  BP:  (!) 108/57 120/63 116/62  Pulse:  70 75 78  Resp:  18 18 16   Temp:  97.9 F (36.6 C) 97.7 F (36.5 C) 97.9 F (36.6 C)  TempSrc:      SpO2:  95% 96% 96%  Weight: 52.3 kg      Weight change:   Intake/Output Summary (Last 24 hours) at 04/07/2023 1056 Last data filed at 04/06/2023 2200 Gross per 24 hour  Intake 60 ml  Output --  Net 60 ml    Assessment/ Plan: Locklin Stanick is an 89 y.o. 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   Assessment/Plan: ESRD: Uremic symptoms of weakness, anorexia, nausea and vomiting, involuntary jerking persist today. He reports that he does typically produce urine still but that he has not urinated since yesterday and his bowel movements have not been normal. Patient denies recent confusion though his daughter does express concerns that he has had AMS. He and his family are in agreement to initiate HD. They have ongoing questions regarding what to expect once he starts HD, including frequency, location, transportation, diet restrictions. These questions were answered and they were assured that additional support would be available both during this hospital stay and at his HD center. Plan to start HD today, followed by gradually increased durations over the next 2 days, using his existing LUE AVG. Will consult VVS as well--see below. Hopefully can use his existing AVG and avoid placing HD catheter. Will coordinate with SW/CM and Renal Navigator to set up OP HD.  Additional recommendations: Avoid nephrotoxic medications including NSAIDs and iodinated intravenous contrast exposure unless the latter is absolutely indicated.   Preferred narcotic agents for pain control are hydromorphone, fentanyl, and methadone. Morphine should not be used.  Avoid Baclofen and avoid oral sodium phosphate and magnesium citrate based laxatives / bowel preps.  Continue strict Input and Output monitoring. Will monitor the patient closely with you and intervene or adjust therapy as indicated by changes in clinical status/labs   Vascular access: VVS consulted given patient has evidence of high grade stenosis in the distal forearm by duplex and is to have a fistulogram.  Anemia: In setting of CKD stage 5/ESRD. Hgb today 8.7. Iron studies: iron 41, TIBC 176, consistent with anemia of chronic disease. Total iron deficit calculated to be 663 mg. Will initiate ferrlecit and aranesp therapies today. Trend CBC, transfuse for HGB <7. Metabolic bone disease of CKD: Phosphorous elevated 7.1, calcium low 7.6; iPTH and calcium pending.  Hyponatremia: Mild. I suspect given anorexia and nausea and vomiting, this is due to hypovolemic state. Stable today at 128. Trend BMP with HD. Hx HTN: BP have been normotensive with some mildly hypotensive readings, that MAPs have been stable. On imdur 90 mg daily, metoprolol 12.5 mg BID.  S/p NS 250 mL bolus 06/03. Consider additional NS 250 mL bolus if needed for additional BP support. Disposition: Patient is okay to proceed with HD for now but has expressed wishes to not continue with HD if his wife,  who is on hospice, were to die.   Labs: Basic Metabolic Panel: Recent Labs  Lab 04/06/23 1557 04/07/23 0454  NA 127* 128*  K 4.8 4.8  CL 97* 97*  CO2 17* 15*  GLUCOSE 86 73  BUN >100* 122*  CREATININE 7.37* 7.32*  CALCIUM 7.4* 7.6*  PHOS  --  7.1*   Liver Function Tests: No results for input(s): "AST", "ALT", "ALKPHOS", "BILITOT", "PROT", "ALBUMIN" in  the last 168 hours. No results for input(s): "LIPASE", "AMYLASE" in the last 168 hours. No results for input(s): "AMMONIA" in the last 168 hours.  CBC: Recent Labs  Lab 04/06/23 1557 04/07/23 0454  WBC 5.8 4.7  HGB 8.9* 8.7*  HCT 25.7* 26.0*  MCV 90.2 90.9  PLT 196 169   Cardiac Enzymes: No results for input(s): "CKTOTAL", "CKMB", "CKMBINDEX", "TROPONINI" in the last 168 hours.  CBG: Recent Labs  Lab 04/07/23 0729  GLUCAP 63*    Iron Studies:  Recent Labs    04/07/23 0454  IRON 41*  TIBC 176*   Studies/Results: DG Chest 2 View  Result Date: 04/06/2023 CLINICAL DATA:  Shortness of breath EXAM: CHEST - 2 VIEW COMPARISON:  None Available. FINDINGS: The heart size and mediastinal contours are within normal limits. Both lungs are clear. The visualized skeletal structures are unremarkable. Prominent appearance of the costochondral margins again noted IMPRESSION: No active cardiopulmonary disease. Electronically Signed   By: Deatra Robinson M.D.   On: 04/06/2023 16:28    Medications: Infusions:   ferric gluconate (FERRLECIT) IVPB     Scheduled Medications:  Chlorhexidine Gluconate Cloth  6 each Topical Q0600   Darbepoetin Alfa  60 mcg Subcutaneous Q7 days   guaiFENesin  600 mg Oral BID   heparin  5,000 Units Subcutaneous Q8H   isosorbide mononitrate  90 mg Oral Daily   metoprolol tartrate  12.5 mg Oral BID   rosuvastatin  10 mg Oral Daily   sodium chloride flush  3 mL Intravenous Q12H   tamsulosin  0.4 mg Oral Daily   I have reviewed scheduled and prn medications.  Physical Exam: Constitutional:Chronically ill appearing elderly gentleman resting comfortably, in no acute distress. Cardio:Regular rate and rhythm. No murmurs, rubs, or gallops. LUE AVG over forearm with bruit and palpable thrill. Pulm:Mild expiratory wheezing with rhonchi and productive cough. Normal work of breathing on room air.  Abdomen:Soft, nontender, nondistended. QMV:HQIONGEX for extremity  edema. Skin:Warm and dry. Neuro:Alert and oriented x3. Rinvolunatary jerking noted. Psych:Pleasant mood and affect.   Champ Mungo, DO Internal Medicine PGY-2 04/07/2023,10:56 AM  LOS: 1 day

## 2023-04-07 NOTE — Progress Notes (Signed)
   04/07/23 1635  Vitals  Temp 97.6 F (36.4 C)  Temp Source Oral  BP 131/76  MAP (mmHg) 93  BP Location Right Arm  BP Method Automatic  Patient Position (if appropriate) Lying  Pulse Rate 84  ECG Heart Rate 85  Resp 18  Oxygen Therapy  SpO2 96 %  During Treatment Monitoring  HD Safety Checks Performed Yes  Intra-Hemodialysis Comments Tx completed  Post Treatment  Dialyzer Clearance Lightly streaked  Duration of HD Treatment -hour(s) 1 hour(s)  Hemodialysis Intake (mL) 0 mL  Liters Processed 12  Fluid Removed (mL) 0 mL  Tolerated HD Treatment No (Comment)  Post-Hemodialysis Comments Pt terminated Tx d/t needle  infiltrated. Dr. Ronalee Belts notified. Ice placed.  AVG/AVF Arterial Site Held (minutes) 10 minutes  AVG/AVF Venous Site Held (minutes) 10 minutes  Fistula / Graft Left Forearm Arteriovenous fistula  Placement Date/Time: 05/04/19 1137   Placed prior to admission: No  Orientation: Left  Access Location: Forearm  Access Type: Arteriovenous fistula  Site Condition No complications  Fistula / Graft Assessment Present;Thrill;Bruit  Status Deaccessed  Needle Size 17  Drainage Description None

## 2023-04-08 ENCOUNTER — Inpatient Hospital Stay (HOSPITAL_COMMUNITY): Payer: Medicare HMO

## 2023-04-08 DIAGNOSIS — Z992 Dependence on renal dialysis: Secondary | ICD-10-CM | POA: Diagnosis not present

## 2023-04-08 DIAGNOSIS — N186 End stage renal disease: Secondary | ICD-10-CM | POA: Diagnosis not present

## 2023-04-08 LAB — BASIC METABOLIC PANEL
Anion gap: 15 (ref 5–15)
BUN: 94 mg/dL — ABNORMAL HIGH (ref 8–23)
CO2: 18 mmol/L — ABNORMAL LOW (ref 22–32)
Calcium: 7.7 mg/dL — ABNORMAL LOW (ref 8.9–10.3)
Chloride: 96 mmol/L — ABNORMAL LOW (ref 98–111)
Creatinine, Ser: 5.95 mg/dL — ABNORMAL HIGH (ref 0.61–1.24)
GFR, Estimated: 9 mL/min — ABNORMAL LOW (ref >60)
Glucose, Bld: 96 mg/dL (ref 70–99)
Potassium: 4.1 mmol/L (ref 3.5–5.1)
Sodium: 129 mmol/L — ABNORMAL LOW (ref 135–145)

## 2023-04-08 LAB — CBC
HCT: 24 % — ABNORMAL LOW (ref 39.0–52.0)
HCT: 25.7 % — ABNORMAL LOW (ref 39.0–52.0)
Hemoglobin: 8.3 g/dL — ABNORMAL LOW (ref 13.0–17.0)
Hemoglobin: 8.6 g/dL — ABNORMAL LOW (ref 13.0–17.0)
MCH: 31.3 pg (ref 26.0–34.0)
MCH: 30.8 pg (ref 26.0–34.0)
MCHC: 34.6 g/dL (ref 30.0–36.0)
MCHC: 33.5 g/dL (ref 30.0–36.0)
MCV: 90.6 fL (ref 80.0–100.0)
MCV: 92.1 fL (ref 80.0–100.0)
Platelets: 179 10*3/uL (ref 150–400)
Platelets: 194 10*3/uL (ref 150–400)
RBC: 2.65 MIL/uL — ABNORMAL LOW (ref 4.22–5.81)
RBC: 2.79 MIL/uL — ABNORMAL LOW (ref 4.22–5.81)
RDW: 13.7 % (ref 11.5–15.5)
RDW: 13.8 % (ref 11.5–15.5)
WBC: 7.2 10*3/uL (ref 4.0–10.5)
WBC: 6.5 10*3/uL (ref 4.0–10.5)
nRBC: 0 % (ref 0.0–0.2)
nRBC: 0 % (ref 0.0–0.2)

## 2023-04-08 LAB — RENAL FUNCTION PANEL
Albumin: 2.5 g/dL — ABNORMAL LOW (ref 3.5–5.0)
Anion gap: 15 (ref 5–15)
BUN: 92 mg/dL — ABNORMAL HIGH (ref 8–23)
CO2: 17 mmol/L — ABNORMAL LOW (ref 22–32)
Calcium: 7.8 mg/dL — ABNORMAL LOW (ref 8.9–10.3)
Chloride: 98 mmol/L (ref 98–111)
Creatinine, Ser: 5.79 mg/dL — ABNORMAL HIGH (ref 0.61–1.24)
GFR, Estimated: 9 mL/min — ABNORMAL LOW (ref >60)
Glucose, Bld: 103 mg/dL — ABNORMAL HIGH (ref 70–99)
Phosphorus: 5 mg/dL — ABNORMAL HIGH (ref 2.5–4.6)
Potassium: 3.9 mmol/L (ref 3.5–5.1)
Sodium: 130 mmol/L — ABNORMAL LOW (ref 135–145)

## 2023-04-08 LAB — HEPATITIS B SURFACE ANTIBODY, QUANTITATIVE: Hep B S AB Quant (Post): <3.5 m[IU]/mL — ABNORMAL LOW (ref >9.9)

## 2023-04-08 MED ORDER — IPRATROPIUM-ALBUTEROL 0.5-2.5 (3) MG/3ML IN SOLN: 3.0000 mL | RESPIRATORY_TRACT | Status: DC | PRN

## 2023-04-08 MED ORDER — ANTICOAGULANT SODIUM CITRATE 4% (200MG/5ML) IV SOLN: 5.0000 mL | Status: DC | PRN

## 2023-04-08 MED ORDER — METOPROLOL TARTRATE 5 MG/5ML IV SOLN: 5.0000 mg | INTRAVENOUS | Status: DC | PRN

## 2023-04-08 MED ORDER — ALTEPLASE 2 MG IJ SOLR: 2.0000 mg | Freq: Once | INTRAMUSCULAR | Status: DC | PRN

## 2023-04-08 MED ORDER — BENZONATATE 100 MG PO CAPS
100.0000 mg | ORAL_CAPSULE | Freq: Two times a day (BID) | ORAL | Status: DC | PRN
  Administered 2023-04-08: 100 mg via ORAL
  Filled 2023-04-08: qty 1

## 2023-04-08 MED ORDER — LIDOCAINE-PRILOCAINE 2.5-2.5 % EX CREA: 1.0000 | TOPICAL_CREAM | CUTANEOUS | Status: DC | PRN

## 2023-04-08 MED ORDER — GUAIFENESIN 100 MG/5ML PO LIQD
5.0000 mL | ORAL | Status: DC | PRN
  Administered 2023-04-08 – 2023-04-09 (×2): 5 mL via ORAL
  Filled 2023-04-08 (×2): qty 15

## 2023-04-08 MED ORDER — LIDOCAINE HCL (PF) 1 % IJ SOLN: 5.0000 mL | INTRAMUSCULAR | Status: DC | PRN

## 2023-04-08 MED ORDER — SENNOSIDES-DOCUSATE SODIUM 8.6-50 MG PO TABS: 1.0000 | ORAL_TABLET | Freq: Every evening | ORAL | Status: DC | PRN

## 2023-04-08 MED ORDER — PENTAFLUOROPROP-TETRAFLUOROETH EX AERO: 1.0000 | INHALATION_SPRAY | CUTANEOUS | Status: DC | PRN

## 2023-04-08 MED ORDER — HYDRALAZINE HCL 20 MG/ML IJ SOLN: 10.0000 mg | INTRAMUSCULAR | Status: DC | PRN

## 2023-04-08 MED ORDER — HEPARIN SODIUM (PORCINE) 1000 UNIT/ML DIALYSIS: 1000.0000 [IU] | INTRAMUSCULAR | Status: DC | PRN

## 2023-04-08 NOTE — Progress Notes (Signed)
Modified Barium Swallow Study  Patient Details  Name: Pedro Young MRN: [id number] Date of Birth: Mar 27, 1933  Today's Date: 04/08/2023  Modified Barium Swallow completed.  Full report located under Chart Review in the Imaging Section.  History of Present Illness 89 yo male presents to ED on 6/3 with fall, weakness, SOB. PMH includes HTN, CKD, preDM, CKD V with HD cath placed but not yet on HD. CT No active cardiopulmonary disease.   Clinical Impression Pt exhibited functional oral control, cohesion, mastication and transit across textures. Minimal pharyngeal phase dysphagia without penetration or aspiration. There was mild valleculae and pyriform sinus residue with thin, nectar and solid that he sensed and swallowed spontaneously to clear. He appears to have cervical  and possibly thoracic osteophytes with minimal retention without retrograde movement. There was no coughing during MBS however clinically he does cough when eating which may be from possible esophgeal retention during meals. The barium pill transited esophagus without difficulty. Recommend he continue regular texture, thin liquids, straw allowed, pills with thin, stay upright after meals and alternate liquids and solids. No further ST needed. Factors that may increase risk of adverse event in presence of aspiration [name]:    Swallow Evaluation Recommendations Recommendations: PO diet PO Diet Recommendation: Regular;Thin liquids (Level 0) Liquid Administration via: Straw;Cup Medication Administration: Whole meds with liquid Supervision: Patient able to self-feed Swallowing strategies  : Slow rate;Small bites/sips Postural changes: Position pt fully upright for meals Oral care recommendations: Oral care BID (2x/day)      [name] 04/08/2023,11:21 AM

## 2023-04-08 NOTE — TOC Initial Note (Addendum)
Transition of Care [facility]) - Initial/Assessment Note    Patient Details  Name: Pedro Young MRN: [id number] Date of Birth: [birth year]  [facility] CM/SW Contact:    Janae Bridgeman, RN Phone Number: 04/08/2023, 10:53 AM  Clinical Narrative:                 CM met with the patient and daughter, Maureen Ralphs at the bedside to discuss TOC needs.  The patient lives at home with his wife, who is on Hospice care at the home.  The patient was admitted for renal failure and plans to return home with home health services.   I spoke with the patient and daughter and offered choice regarding home health and DME services and they did not have a preference.  I called [facility] and set up for [facility] PT/OT and aide.  Active orders placed to be co-signed.  [employer] was called to deliver a hospital bed and 18 inch wheelchair to the home on Friday, 04/10/23.  Renal navigator to follow for clip for HD appointments.  Patient to be discharged home with family once he is medically stable to discharge.  Patient will need referral for transportation assistance for HD.  Daughter plans to provide car transport until transportation is arranged.  [employer] [place], MSW will follow up with the daughter for transportation application.  Expected Discharge Plan: Home w Home Health Services Barriers to Discharge: Continued Medical Work up   Patient Goals and CMS Choice Patient states their goals for this hospitalization and ongoing recovery are:: To return home CMS Medicare.gov Compare Post Acute Care list provided to:: Patient Choice offered to / list presented to : Patient [place] ownership interest in [facility].provided to:: Patient    Expected Discharge Plan and Services   Discharge Planning Services: CM Consult Post Acute Care Choice: Home Health, Durable Medical Equipment Living arrangements for the past 2 months: Single Family Home                 DME Arranged: Hospital bed, Wheelchair  manual DME Agency: [employer] Date DME Agency Contacted: 04/08/23 Time DME Agency Contacted: 1047 Representative spoke with at DME Agency: Milas Gain, CM with [employer] HH Arranged: PT, OT, Nurse's Aide HH Agency: [facility] Date [facility] Agency Contacted: 04/08/23 Time HH Agency Contacted: 1048 Representative spoke with at [facility] Agency: Kandee Keen, CM with [facility] accepted for PT/OT/aide  Prior Living Arrangements/Services Living arrangements for the past 2 months: Single Family Home Lives with:: Adult Children (Lives with daughter, Eloi Eastes at the home who provides 24 hour care) Patient language and need for interpreter reviewed:: Yes Do you feel safe going back to the place where you live?: Yes      Need for Family Participation in Patient Care: Yes (Comment) Care giver support system in place?: Yes (comment) Current home services: DME (has Cane, RW and BSC at the house - needs [facility] and Hospital bed) Criminal Activity/Legal Involvement Pertinent to Current Situation/Hospitalization: No - Comment as needed  Activities of Daily Living Home Assistive Devices/Equipment: None ADL Screening (condition at time of admission) Patient's cognitive ability adequate to safely complete daily activities?: Yes Is the patient deaf or have difficulty hearing?: No Does the patient have difficulty seeing, even when wearing glasses/contacts?: Yes Does the patient have difficulty concentrating, remembering, or making decisions?: No Patient able to express need for assistance with ADLs?: Yes Does the patient have difficulty dressing or bathing?: No Independently performs ADLs?: Yes (appropriate for developmental  age) Does the patient have difficulty walking or climbing stairs?: Yes Weakness of Legs: Both Weakness of Arms/Hands: None  Permission Sought/Granted Permission sought to share information with : Case Manager, Family Supports, Oceanographer granted to share information  with : Yes, Verbal Permission Granted     Permission granted to share info w AGENCY: [facility] services, [employer] called to order delivery of hospital bed and WC to the home on Friday, 6/7  Permission granted to share info w Relationship: daughter, Maureen Ralphs - [phone]     Emotional Assessment Appearance:: Appears stated age Attitude/Demeanor/Rapport: Gracious Affect (typically observed): Accepting Orientation: : Oriented to Self, Oriented to Place, Oriented to  Time, Oriented to Situation Alcohol / Substance Use: Not Applicable Psych Involvement: No (comment)  Admission diagnosis:  Wheezing [R06.2] Confusion [R41.0] Uremia [N19] Bronchitis [J40] ESRD (end stage renal disease) (HCC) [N18.6] Subacute cough [R05.2] Fall, initial encounter [W19.XXXA] Acute metabolic encephalopathy [G93.41] Patient Active Problem List   Diagnosis Date Noted   Protein-calorie malnutrition, severe 04/07/2023   Acute metabolic encephalopathy 04/06/2023   Acute renal failure superimposed on stage 5 chronic kidney disease, not on chronic dialysis (HCC) 04/06/2023   Hyponatremia 04/06/2023   CAD (coronary artery disease) 04/06/2023   Metabolic acidosis 04/06/2023   Angina pectoris (HCC) 06/19/2020   Bloody diarrhea    Colitis    Acute gastroenteritis 08/08/2019   Gout 08/08/2015   Acute pancreatitis 08/02/2015   PRURITUS 03/05/2010   Anemia of chronic renal failure, stage 4 (severe) (HCC) 01/18/2010   Liver abscess via hepatic artery 2011, 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 01/18/2010   HEMATURIA, HX OF 01/18/2010   GASTRITIS 06/27/2009   Calculus of gallbladder and bile duct 06/21/2009   AKI (acute kidney injury) (HCC) 06/19/2009   LOSS OF WEIGHT 06/19/2009   ABDOMINAL PAIN, GENERALIZED 06/19/2009   Pure hyperglyceridemia 06/18/2009   HYPERTENSION, BENIGN ESSENTIAL 06/18/2009   PCP:  Maudie Flakes, FNP Pharmacy:   [id number] - [place], [place] - [address] [place] [address] Phone: [phone] Fax: [phone]     Social Determinants of Health (SDOH) Social History: SDOH Screenings   Food Insecurity: No Food Insecurity (04/06/2023)  Housing: Patient Declined (04/06/2023)  Transportation Needs: No Transportation Needs (04/06/2023)  Utilities: Not At Risk (04/06/2023)  Tobacco Use: Medium Risk (04/06/2023)   SDOH Interventions:     Readmission Risk Interventions    04/08/2023   10:49 AM  Readmission Risk Prevention Plan  Transportation Screening Complete  PCP or Specialist Appt within 5-7 Days Complete  Home Care Screening Complete  Medication Review (RN CM) Complete

## 2023-04-08 NOTE — Progress Notes (Signed)
    Durable Medical Equipment  (From admission, onward)           Start     Ordered   04/08/23 1041  For home use only DME lightweight manual wheelchair with seat cushion  Once       Comments: Patient suffers from AKI on CKD 5, Acute metabolic acidosis which impairs their ability to perform daily activities like dressing and toileting in the home.  A walker will not resolve  issue with performing activities of daily living. A wheelchair will allow patient to safely perform daily activities. Patient is not able to propel themselves in the home using a standard weight wheelchair due to general weakness. Patient can self propel in the lightweight wheelchair.  Needs 18 in wheelchair, Length of need 12 months . Accessories: elevating leg rests (ELRs), wheel locks, extensions and anti-tippers.   04/08/23 1040   04/08/23 0839  For home use only DME Hospital bed  Once       Question Answer Comment  Length of Need 12 Months   Patient has (list medical condition): AKI on CKDV, generalized weakness, Acute on chronic metabolic acidosis, Hyponatremia   The above medical condition requires: Patient requires the ability to reposition frequently   Head must be elevated greater than: 30 degrees   Bed type Semi-electric   Support Surface: Gel Overlay      04/08/23 0841

## 2023-04-08 NOTE — Progress Notes (Signed)
Spoke to pt's daughter, Maureen Ralphs, via phone. Discussed out-pt HD clinic options. Daughter advised that [facility] is currently full per [facility] admissions. Daughter agreeable to another [facility] clinic in order for pt be able to get transportation to/from HD. Pt's referral with [facility] is pending at this time. TOC staff aware of pt's need for transportation to/from HD at d/c. Will assist as needed.   Olivia Canter Renal Navigator [phone]

## 2023-04-08 NOTE — Progress Notes (Signed)
Physical Therapy Treatment Patient Details Name: Pedro Young MRN: [id number] DOB: Aug 18, 1933 Today's Date: 04/08/2023   History of Present Illness 89 yo male presents to ED on 6/3 with fall, weakness, SOB. PMH includes HTN, CKD, preDM, CKD V with HD cath placed but not yet on HD.    PT Comments    Pt taking nap on entry, daughter in room. Wakes easily and with encouragement agreeable to ambulation in hallway. Pt requiring no physical assist for bed mobility or transfers and only requires min A for steadying with 1x LoB during hallway ambulation. D/c plans remain appropriate at this time.     Recommendations for follow up therapy are one component of a multi-disciplinary discharge planning process, led by the attending physician.  Recommendations may be updated based on patient status, additional functional criteria and insurance authorization.     Assistance Recommended at Discharge Frequent or constant Supervision/Assistance  Patient can return home with the following A little help with walking and/or transfers;A little help with bathing/dressing/bathroom   Equipment Recommendations  Hospital bed       Precautions / Restrictions Precautions Precautions: Fall Restrictions Weight Bearing Restrictions: No     Mobility  Bed Mobility Overal bed mobility: Needs Assistance Bed Mobility: Supine to Sit, Sit to Supine     Supine to sit: HOB elevated, Supervision Sit to supine: HOB elevated, Supervision   General bed mobility comments: supervision for safety, increased time and effort and use of bed rail but no physical assist required    Transfers Overall transfer level: Needs assistance Equipment used: Rolling walker (2 wheels) Transfers: Sit to/from Stand Sit to Stand: Min guard           General transfer comment: min guard for safety, initially steadies himself by bracing his LE on the EoB, does not need assist    Ambulation/Gait Ambulation/Gait assistance: Min  assist Gait Distance (Feet): 200 Feet Assistive device: Rolling walker (2 wheels) Gait Pattern/deviations: Step-through pattern, Decreased stride length, Trunk flexed, Drifts right/left Gait velocity: decr Gait velocity interpretation: <1.8 ft/sec, indicate of risk for recurrent falls   General Gait Details: light hands on assist, which generally steady ambulation in hallway, 1x scissoring step requiring min A for steadying         Balance Overall balance assessment: Needs assistance Sitting-balance support: No upper extremity supported, Feet supported Sitting balance-Leahy Scale: Fair     Standing balance support: Bilateral upper extremity supported, During functional activity Standing balance-Leahy Scale: Poor Standing balance comment: requires UE support                            Cognition Arousal/Alertness: Awake/alert Behavior During Therapy: WFL for tasks assessed/performed Overall Cognitive Status: Impaired/Different from baseline Area of Impairment: Orientation, Following commands, Safety/judgement, Problem solving                 Orientation Level: Disoriented to, Place, Situation, Time     Following Commands: Follows one step commands with increased time Safety/Judgement: Decreased awareness of deficits   Problem Solving: Difficulty sequencing, Requires verbal cues, Requires tactile cues General Comments: increased processing time, requires vc for safety           General Comments General comments (skin integrity, edema, etc.): VSS on RA, increased congestion noted with bouts of coughing      Pertinent Vitals/Pain Pain Assessment Faces Pain Scale: No hurt     PT Goals (current goals can now be found in  the care plan section) Acute Rehab PT Goals PT Goal Formulation: With patient Time For Goal Achievement: 04/21/23 Potential to Achieve Goals: Good Progress towards PT goals: Progressing toward goals    Frequency    Min  4X/week      PT Plan Current plan remains appropriate       AM-PAC PT "6 Clicks" Mobility   Outcome Measure  Help needed turning from your back to your side while in a flat bed without using bedrails?: A Little Help needed moving from lying on your back to sitting on the side of a flat bed without using bedrails?: A Little Help needed moving to and from a bed to a chair (including a wheelchair)?: A Little Help needed standing up from a chair using your arms (e.g., wheelchair or bedside chair)?: A Little Help needed to walk in hospital room?: A Little Help needed climbing 3-5 steps with a railing? : A Lot 6 Click Score: 17    End of Session Equipment Utilized During Treatment: Gait belt Activity Tolerance: Patient tolerated treatment well Patient left: in bed;with call bell/phone within reach;with bed alarm set;with family/visitor present Nurse Communication: Mobility status PT Visit Diagnosis: Other abnormalities of gait and mobility (R26.89)     Time: 8657-8469 PT Time Calculation (min) (ACUTE ONLY): 17 min  Charges:  $Gait Training: 8-22 mins                     [name] B. Beverely Risen PT, DPT Acute Rehabilitation Services Please use secure chat or  Call Office [phone]    [name] [facility] 04/08/2023, 12:58 PM

## 2023-04-08 NOTE — Progress Notes (Signed)
    Durable Medical Equipment  (From admission, onward)           Start     Ordered   04/08/23 0839  For home use only DME Hospital bed  Once       Question Answer Comment  Length of Need 12 Months   Patient has (list medical condition): AKI on CKDV, generalized weakness, Acute on chronic metabolic acidosis, Hyponatremia   The above medical condition requires: Patient requires the ability to reposition frequently   Head must be elevated greater than: 30 degrees   Bed type Semi-electric   Support Surface: Gel Overlay      04/08/23 0841

## 2023-04-08 NOTE — Progress Notes (Signed)
Received patient in bed to unit.  Alert and oriented.  Informed consent signed and in chart.   TX duration:2.5  Patient tolerated well.  Transported back to the room  Alert, without acute distress.  Hand-off given to patient's nurse.   Access used: left AVF Access issues: none  Total UF removed: 0 Medication(s) given: ferric gluconate   04/08/23 1736  Vitals  Temp 97.8 F (36.6 C)  Temp Source Oral  BP (!) 162/73  MAP (mmHg) 96  BP Location Right Arm  BP Method Automatic  Patient Position (if appropriate) Lying  Pulse Rate (!) 102  Pulse Rate Source Monitor  ECG Heart Rate (!) 105  Resp (!) 21  Oxygen Therapy  SpO2 97 %  O2 Device Room Air  During Treatment Monitoring  HD Safety Checks Performed Yes  Intra-Hemodialysis Comments Tx completed;Tolerated well  Dialysis Fluid Bolus Normal Saline  Bolus Amount (mL) 300 mL      Pharoah Goggins [facility]

## 2023-04-08 NOTE — Progress Notes (Signed)
PROGRESS NOTE    Pedro Young  QIO:[id number] DOB: [birth year] DOA: 04/06/2023 PCP: Maudie Flakes, FNP   Brief Narrative:  89 y.o. male with PMH significant fo HTN, HLD, CAD, and CKD V not yet on dialysis with an AV fistula ready on left upper extremity. Patient has progressively worsening oral intake, generalized weakness and has had frequent falls, fell about 3 times in the last 2 weeks.  Recently also developed nonproductive cough, shortness of breath, wheezing - chest x-ray patient was clear.  Family also reported involuntary jerking, unsteadiness upon standing and generalized weakness.  On 6/3 patient was found on the floor without loss of consciousness and brought to the hospital for further evaluation.  Upon admission noted to have AKI on CKD stage V and metabolic acidosis.  Nephrology consulted with hemodialysis.  Dialysis.   Assessment & Plan:  Principal Problem:   Acute renal failure superimposed on stage 5 chronic kidney disease, not on chronic dialysis (HCC) Active Problems:   Anemia of chronic renal failure, stage 4 (severe) (HCC)   HYPERTENSION, BENIGN ESSENTIAL   Acute metabolic encephalopathy   Hyponatremia   CAD (coronary artery disease)   Metabolic acidosis   Protein-calorie malnutrition, severe    AKI on CKD V > ESRD Acute on chronic metabolic acidosis Uremia Worsening renal function.  Creatinine now up to 7.32, BUN 122 with metabolic acidosis.  Nephrology team consulted with plans to initiate hemodialysis.  Already has left upper extremity AV fistula.    Hyponatremia  Sodium 128 suspect from volume overload  Hypotension H/o essential hypertension Blood pressure on lower side of normal.  PTA on metoprolol 50 mg twice daily, amlodipine 10 mg daily, Lasix PRN.  Currently on hold Currently on metoprolol 12.5 mg twice daily, Imdur 90 mg daily.  IV as needed.   CAD/HLD Continue home statin   Anemia of chronic disease Hemoglobin around baseline of 9.  Iron  and Aranesp  BPH Flomax   Generalized weakness PT eval   DVT prophylaxis: Subcu heparin Code Status: Full code Family Communication: Daughter at bedside Status is: Inpatient Continue hospital stay until cleared by nephrology.       Diet Orders (From admission, onward)     Start     Ordered   04/06/23 2027  Diet renal with fluid restriction Fluid restriction: 1200 mL Fluid; Room service appropriate? Yes; Fluid consistency: Thin  Diet effective now       Question Answer Comment  Fluid restriction: 1200 mL Fluid   Room service appropriate? Yes   Fluid consistency: Thin      04/06/23 2027            Subjective: Feeling okay.  Does have some cough.   Examination:  General exam: Appears calm and comfortable  Respiratory system: Some rhonchi Cardiovascular system: S1 & S2 heard, RRR. No JVD, murmurs, rubs, gallops or clicks. No pedal edema. Gastrointestinal system: Abdomen is nondistended, soft and nontender. No organomegaly or masses felt. Normal bowel sounds heard. Central nervous system: Alert and oriented. No focal neurological deficits. Extremities: Symmetric 5 x 5 power. Skin: No rashes, lesions or ulcers Psychiatry: Judgement and insight appear normal. Mood & affect appropriate. AV fistula noted left upper extremity Objective: Vitals:   04/07/23 1750 04/07/23 1936 04/08/23 0042 04/08/23 0429  BP:  127/61 133/68 138/69  Pulse: 89 89 88 87  Resp: 18 19 16 18   Temp: 97.8 F (36.6 C)  97.8 F (36.6 C) (!) 97.5 F (36.4 C)  TempSrc: Axillary  Oral Oral  SpO2: 94% 98% 97% 95%  Weight:        Intake/Output Summary (Last 24 hours) at 04/08/2023 0726 Last data filed at 04/07/2023 1939 Gross per 24 hour  Intake 490 ml  Output 150 ml  Net 340 ml   Filed Weights   04/07/23 1450 04/07/23 1721 04/07/23 1730  Weight: 52.9 kg 52.9 kg 53 kg    Scheduled Meds:  Chlorhexidine Gluconate Cloth  6 each Topical Q0600   Darbepoetin Alfa  60 mcg Subcutaneous Q7 days    feeding supplement (NEPRO CARB STEADY)  237 mL Oral BID BM   guaiFENesin  600 mg Oral BID   heparin  5,000 Units Subcutaneous Q8H   isosorbide mononitrate  90 mg Oral Daily   metoprolol tartrate  12.5 mg Oral BID   multivitamin  1 tablet Oral QHS   rosuvastatin  10 mg Oral Daily   sodium chloride flush  3 mL Intravenous Q12H   tamsulosin  0.4 mg Oral Daily   Continuous Infusions:  ferric gluconate (FERRLECIT) IVPB 125 mg (04/07/23 1143)    Nutritional status Signs/Symptoms: severe fat depletion, severe muscle depletion, energy intake < 75% for > or equal to 1 month Interventions: Nepro shake, MVI Body mass index is 19.44 kg/m.  Data Reviewed:   CBC: Recent Labs  Lab 04/06/23 1557 04/07/23 0454  WBC 5.8 4.7  HGB 8.9* 8.7*  HCT 25.7* 26.0*  MCV 90.2 90.9  PLT 196 169   Basic Metabolic Panel: Recent Labs  Lab 04/06/23 1557 04/07/23 0454  NA 127* 128*  K 4.8 4.8  CL 97* 97*  CO2 17* 15*  GLUCOSE 86 73  BUN 123* 122*  CREATININE 7.37* 7.32*  CALCIUM 7.4* 7.6*  PHOS  --  7.1*   GFR: Estimated Creatinine Clearance: 5.2 mL/min (A) (by C-G formula based on SCr of 7.32 mg/dL (H)). Liver Function Tests: No results for input(s): "AST", "ALT", "ALKPHOS", "BILITOT", "PROT", "ALBUMIN" in the last 168 hours. No results for input(s): "LIPASE", "AMYLASE" in the last 168 hours. No results for input(s): "AMMONIA" in the last 168 hours. Coagulation Profile: No results for input(s): "INR", "PROTIME" in the last 168 hours. Cardiac Enzymes: No results for input(s): "CKTOTAL", "CKMB", "CKMBINDEX", "TROPONINI" in the last 168 hours. BNP (last 3 results) No results for input(s): "PROBNP" in the last 8760 hours. HbA1C: No results for input(s): "HGBA1C" in the last 72 hours. CBG: Recent Labs  Lab 04/07/23 0729 04/07/23 1206  GLUCAP 63* 90   Lipid Profile: No results for input(s): "CHOL", "HDL", "LDLCALC", "TRIG", "CHOLHDL", "LDLDIRECT" in the last 72 hours. Thyroid  Function Tests: No results for input(s): "TSH", "T4TOTAL", "FREET4", "T3FREE", "THYROIDAB" in the last 72 hours. Anemia Panel: Recent Labs    04/07/23 0454  TIBC 176*  IRON 41*   Sepsis Labs: No results for input(s): "PROCALCITON", "LATICACIDVEN" in the last 168 hours.  Recent Results (from the past 240 hour(s))  Resp panel by RT-PCR (RSV, Flu A&B, Covid) Anterior Nasal Swab     Status: None   Collection Time: 04/06/23  3:55 PM   Specimen: Anterior Nasal Swab  Result Value Ref Range Status   SARS Coronavirus 2 by RT PCR NEGATIVE NEGATIVE Final    Comment: (NOTE) SARS-CoV-2 target nucleic acids are NOT DETECTED.  The SARS-CoV-2 RNA is generally detectable in upper respiratory specimens during the acute phase of infection. The lowest concentration of SARS-CoV-2 viral copies this assay can detect is 138 copies/mL. A negative result does not  preclude SARS-Cov-2 infection and should not be used as the sole basis for treatment or other patient management decisions. A negative result may occur with  improper specimen collection/handling, submission of specimen other than nasopharyngeal swab, presence of viral mutation(s) within the areas targeted by this assay, and inadequate number of viral copies(<138 copies/mL). A negative result must be combined with clinical observations, patient history, and epidemiological information. The expected result is Negative.  Fact Sheet for Patients:  BloggerCourse.com  Fact Sheet for Healthcare Providers:  SeriousBroker.it  This test is no t yet approved or cleared by the [place] FDA and  has been authorized for detection and/or diagnosis of SARS-CoV-2 by FDA under an Emergency Use Authorization (EUA). This EUA will remain  in effect (meaning this test can be used) for the duration of the COVID-19 declaration under Section 564(b)(1) of the Act, 21 U.S.C.section 360bbb-3(b)(1), unless the  authorization is terminated  or revoked sooner.       Influenza A by PCR NEGATIVE NEGATIVE Final   Influenza B by PCR NEGATIVE NEGATIVE Final    Comment: (NOTE) The Xpert Xpress SARS-CoV-2/FLU/RSV plus assay is intended as an aid in the diagnosis of influenza from Nasopharyngeal swab specimens and should not be used as a sole basis for treatment. Nasal washings and aspirates are unacceptable for Xpert Xpress SARS-CoV-2/FLU/RSV testing.  Fact Sheet for Patients: BloggerCourse.com  Fact Sheet for Healthcare Providers: SeriousBroker.it  This test is not yet approved or cleared by the [place] FDA and has been authorized for detection and/or diagnosis of SARS-CoV-2 by FDA under an Emergency Use Authorization (EUA). This EUA will remain in effect (meaning this test can be used) for the duration of the COVID-19 declaration under Section 564(b)(1) of the Act, 21 U.S.C. section 360bbb-3(b)(1), unless the authorization is terminated or revoked.     Resp Syncytial Virus by PCR NEGATIVE NEGATIVE Final    Comment: (NOTE) Fact Sheet for Patients: BloggerCourse.com  Fact Sheet for Healthcare Providers: SeriousBroker.it  This test is not yet approved or cleared by the [place] FDA and has been authorized for detection and/or diagnosis of SARS-CoV-2 by FDA under an Emergency Use Authorization (EUA). This EUA will remain in effect (meaning this test can be used) for the duration of the COVID-19 declaration under Section 564(b)(1) of the Act, 21 U.S.C. section 360bbb-3(b)(1), unless the authorization is terminated or revoked.  Performed at [facility], [address] [place], [place] [address]          Radiology Studies: DG Chest 2 View  Result Date: 04/06/2023 CLINICAL DATA:  Shortness of breath EXAM: CHEST - 2 VIEW COMPARISON:  None Available.  FINDINGS: The heart size and mediastinal contours are within normal limits. Both lungs are clear. The visualized skeletal structures are unremarkable. Prominent appearance of the costochondral margins again noted IMPRESSION: No active cardiopulmonary disease. Electronically Signed   By: Deatra Robinson M.D.   On: 04/06/2023 16:28           LOS: 2 days   Time spent= 35 mins    Jamichael Knotts Joline Maxcy, MD [facility]  If 7PM-7AM, please contact night-coverage  04/08/2023, 7:26 AM

## 2023-04-08 NOTE — Evaluation (Signed)
Clinical/Bedside Swallow Evaluation Patient Details  Name: Pedro Young MRN: [id number] Date of Birth: 10-Jul-1933  Today's Date: 04/08/2023 Time: SLP Start Time (ACUTE ONLY): 0831 SLP Stop Time (ACUTE ONLY): 0845 SLP Time Calculation (min) (ACUTE ONLY): 14 min  Past Medical History:  Past Medical History:  Diagnosis Date   Cancer (HCC)    Hypertension    Renal disorder    CRF   Past Surgical History:  Past Surgical History:  Procedure Laterality Date   AV FISTULA PLACEMENT Left 05/04/2019   Procedure: Arteriovenous (Av) Fistula Creation Left Arm;  Surgeon: Larina Earthly, MD;  Location: [facility] OR;  Service: Vascular;  Laterality: Left;   BIOPSY  08/10/2019   Procedure: BIOPSY;  Surgeon: Lynann Bologna, MD;  Location: [facility] ENDOSCOPY;  Service: Endoscopy;;   CHOLECYSTECTOMY     COLONOSCOPY N/A 08/10/2019   Procedure: COLONOSCOPY;  Surgeon: Lynann Bologna, MD;  Location: [facility] ENDOSCOPY;  Service: Endoscopy;  Laterality: N/A;   EYE SURGERY Bilateral    cataract   LEFT HEART CATH AND CORONARY ANGIOGRAPHY N/A 06/19/2020   Procedure: LEFT HEART CATH AND CORONARY ANGIOGRAPHY;  Surgeon: Lyn Records, MD;  Location: [facility] INVASIVE CV LAB;  Service: Cardiovascular;  Laterality: N/A;   POLYPECTOMY  08/10/2019   Procedure: POLYPECTOMY;  Surgeon: Lynann Bologna, MD;  Location: [facility] ENDOSCOPY;  Service: Endoscopy;;   HPI:  89 yo male presents to ED on 6/3 with fall, weakness, SOB. PMH includes HTN, CKD, preDM, CKD V with HD cath placed but not yet on HD. CT No active cardiopulmonary disease.    Assessment / Plan / Recommendation  Clinical Impression  Pt and family state he coughs throughout the day and have not noticed difficulty with po but stated staff was concerned with his swallow.  He is alert, dentition intact and interactive. He did exhibit consistent s/s aspiration with immedidate or delayed coughs with most trials thin, possibly less without straw. Functional mastication with solid texture and no  difficulty with applesauce. Recommend MBS which is scheduled for 10:30 today. He can continue regular texture/thin liquids until MBS. SLP Visit Diagnosis: Dysphagia, unspecified (R13.10)    Aspiration Risk  Mild aspiration risk    Diet Recommendation Regular;Thin liquid   Liquid Administration via: Cup;Straw Medication Administration: Whole meds with liquid Supervision: Patient able to self feed Compensations: Slow rate;Small sips/bites Postural Changes: Seated upright at 90 degrees    Other  Recommendations Oral Care Recommendations: Oral care BID    Recommendations for follow up therapy are one component of a multi-disciplinary discharge planning process, led by the attending physician.  Recommendations may be updated based on patient status, additional functional criteria and insurance authorization.  Follow up Recommendations        Assistance Recommended at Discharge    Functional Status Assessment    Frequency and Duration            Prognosis        Swallow Study   General Date of Onset: 04/06/23 HPI: 89 yo male presents to ED on 6/3 with fall, weakness, SOB. PMH includes HTN, CKD, preDM, CKD V with HD cath placed but not yet on HD. CT No active cardiopulmonary disease. Type of Study: Bedside Swallow Evaluation Previous Swallow Assessment:  (none) Diet Prior to this Study: Regular;Thin liquids (Level 0) Temperature Spikes Noted: Yes Respiratory Status: Room air History of Recent Intubation: No Behavior/Cognition: Alert;Cooperative;Pleasant mood Oral Cavity Assessment: Within Functional Limits Oral Care Completed by SLP: No Oral Cavity - Dentition: Adequate natural  dentition Vision: Functional for self-feeding Self-Feeding Abilities: Able to feed self Patient Positioning: Upright in bed Baseline Vocal Quality: Normal Volitional Cough: Strong Volitional Swallow: Able to elicit    Oral/Motor/Sensory Function Overall Oral Motor/Sensory Function: Within  functional limits   Ice Chips Ice chips: Not tested   Thin Liquid Thin Liquid: Impaired Presentation: Cup;Straw Oral Phase Functional Implications:  (none) Pharyngeal  Phase Impairments: Cough - Immediate;Cough - Delayed    Nectar Thick Nectar Thick Liquid: Not tested   Honey Thick Honey Thick Liquid: Not tested   Puree Puree: Within functional limits   Solid     Solid: Within functional limits      Pedro Young 04/08/2023,8:56 AM

## 2023-04-08 NOTE — Progress Notes (Signed)
Subjective:  Patient reports feeling well today. He and his daughter are encouraged by changes they have noticed after HD yesterday.   Objective Vital signs in last 24 hours: Vitals:   04/07/23 1936 04/08/23 0042 04/08/23 0429 04/08/23 0726  BP: 127/61 133/68 138/69 137/64  Pulse: 89 88 87 91  Resp: 19 16 18 14   Temp:  97.8 F (36.6 C) (!) 97.5 F (36.4 C) (!) 97.4 F (36.3 C)  TempSrc:  Oral Oral Oral  SpO2: 98% 97% 95% 94%  Weight:       Weight change: 0.6 kg  Intake/Output Summary (Last 24 hours) at 04/08/2023 0757 Last data filed at 04/07/2023 1939 Gross per 24 hour  Intake 490 ml  Output 150 ml  Net 340 ml    Assessment/ Plan: Pedro Young is a 89 y.o. 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  ESRD: Unfortunately yesterday patient was unable to complete first HD session due to access infiltration. Despite incomplete HD, his daughter at bedside reports improvement of his appetite (he was able to eat a few bites of food without vomiting), improvement of UOP (not much measured; daughter explains he had several accidents in bed), decrease in involuntary jerking, and some improvement in his mental status (this is not fully resolved however). The patient and his daughter feel hopeful that he will continue to have improvements. Renal function improved today with BUN/Cr 94/5.95 (122/7.32).  Plan today for additional attempt to access LUE fistula, and if unsuccessful, will discuss need for fistulogram with VVS. Vascular access: May need fistulogram today, if unable to successfully access fistula with HD today. Will follow and discuss with VVS as needed.  Anemia: In setting of CKD stage 5/ESRD. Hgb today 8.3, stable. Plan to continue daily iron infusion, weekly aranesp. Trend CBC, transfuse for HGB <7. Metabolic bone disease of CKD: iPTH and calcium pending. Phosphorous elevated, expect improvement with this with HD. Hyponatremia:  Mild, stable at 129. I suspect this is due to hypovolemic state and will continue to improve with improvement of uremia. Trend BMP with HD. HTN/volume: BP have been normotensive. No concern for hypervolemia. On imdur 90 mg daily, metoprolol 12.5 mg BID.  Per primary. Disposition: Patient is okay to proceed with HD for now but has expressed wishes to not continue with HD if his wife, who is on hospice, were to die.   Labs: Basic Metabolic Panel: Recent Labs  Lab 04/06/23 1557 04/07/23 0454  NA 127* 128*  K 4.8 4.8  CL 97* 97*  CO2 17* 15*  GLUCOSE 86 73  BUN 123* 122*  CREATININE 7.37* 7.32*  CALCIUM 7.4* 7.6*  PHOS  --  7.1*   Liver Function Tests: No results for input(s): "AST", "ALT", "ALKPHOS", "BILITOT", "PROT", "ALBUMIN" in the last 168 hours. No results for input(s): "LIPASE", "AMYLASE" in the last 168 hours. No results for input(s): "AMMONIA" in the last 168 hours.  CBC: Recent Labs  Lab 04/06/23 1557 04/07/23 0454  WBC 5.8 4.7  HGB 8.9* 8.7*  HCT 25.7* 26.0*  MCV 90.2 90.9  PLT 196 169   Cardiac Enzymes: No results for input(s): "CKTOTAL", "CKMB", "CKMBINDEX", "TROPONINI" in the last 168 hours.  CBG: Recent Labs  Lab 04/07/23 0729 04/07/23 1206  GLUCAP 63* 90    Iron Studies:  Recent Labs    04/07/23 0454  IRON 41*  TIBC 176*   Studies/Results: DG Chest 2 View  Result Date:  04/06/2023 CLINICAL DATA:  Shortness of breath EXAM: CHEST - 2 VIEW COMPARISON:  None Available. FINDINGS: The heart size and mediastinal contours are within normal limits. Both lungs are clear. The visualized skeletal structures are unremarkable. Prominent appearance of the costochondral margins again noted IMPRESSION: No active cardiopulmonary disease. Electronically Signed   By: Deatra Robinson M.D.   On: 04/06/2023 16:28    Medications: Infusions:  ferric gluconate (FERRLECIT) IVPB 125 mg (04/07/23 1143)   Scheduled Medications:  Chlorhexidine Gluconate Cloth  6 each Topical  Q0600   Darbepoetin Alfa  60 mcg Subcutaneous Q7 days   feeding supplement (NEPRO CARB STEADY)  237 mL Oral BID BM   guaiFENesin  600 mg Oral BID   heparin  5,000 Units Subcutaneous Q8H   isosorbide mononitrate  90 mg Oral Daily   metoprolol tartrate  12.5 mg Oral BID   multivitamin  1 tablet Oral QHS   rosuvastatin  10 mg Oral Daily   sodium chloride flush  3 mL Intravenous Q12H   tamsulosin  0.4 mg Oral Daily   I have reviewed scheduled and prn medications.  Physical Exam: Constitutional:Chronically-ill appearing elderly gentleman resting comfortably, in no acute distress. Cardio:Regular rate and rhythm. No murmurs, rubs, or gallops. LUE AV fistula with bruit and palpable thrill. Pulm:Diffuse inspiratory and expiratory rhonchi. Normal work of breathing on room air. Abdomen:Soft, nontender, nondistended. ZOX:[id number] for extremity edema. Skin:Warm and dry. Neuro:Alert and oriented x3. No focal deficit noted. No asterixis or involuntary jerking. Psych:Pleasant mood and affect.   Champ Mungo, DO Internal Medicine PGY-2 04/08/2023,7:57 AM  LOS: 2 days

## 2023-04-08 NOTE — Progress Notes (Signed)
OT Cancellation Note  Patient Details Name: Pedro Young MRN: [id number] DOB: [birth year]   Cancelled Treatment:    Reason Eval/Treat Not Completed: Patient at procedure or test/ unavailable (at HD; will return as schedule allows.)  Tyler Deis, OTR/L [facility] Acute Rehabilitation Office: [phone]   Myrla Halsted 04/08/2023, 2:21 PM

## 2023-04-08 NOTE — Progress Notes (Signed)
  VASCULAR SURGERY ASSESSMENT & PLAN:   END-STAGE RENAL DISEASE: The patient has a left forearm fistula that has a reasonable thrill.  Initially when they dialyzed him yesterday he had an infiltrate but subsequently they tried again and the fistula worked fine.  Would stay away from the area of infiltrate.  If he has continued problems he would need a fistulogram.  SUBJECTIVE:   No complaints this morning.  PHYSICAL EXAM:   Vitals:   04/07/23 1936 04/08/23 0042 04/08/23 0429 04/08/23 0726  BP: 127/61 133/68 138/69 137/64  Pulse: 89 88 87 91  Resp: 19 16 18 14   Temp:  97.8 F (36.6 C) (!) 97.5 F (36.4 C) (!) 97.4 F (36.3 C)  TempSrc:  Oral Oral Oral  SpO2: 98% 97% 95% 94%  Weight:       His fistula has a good thrill.  It is slightly pulsatile. He has some moderate swelling just below the antecubital level where he had an infiltrate.  LABS:   Lab Results  Component Value Date   WBC 4.7 04/07/2023   HGB 8.7 (L) 04/07/2023   HCT 26.0 (L) 04/07/2023   MCV 90.9 04/07/2023   PLT 169 04/07/2023   Lab Results  Component Value Date   CREATININE 7.32 (H) 04/07/2023   Lab Results  Component Value Date   INR 1.2 08/31/2019   CBG (last 3)  Recent Labs    04/07/23 0729 04/07/23 1206  GLUCAP 63* 90    PROBLEM LIST:    Principal Problem:   Acute renal failure superimposed on stage 5 chronic kidney disease, not on chronic dialysis (HCC) Active Problems:   Anemia of chronic renal failure, stage 4 (severe) (HCC)   HYPERTENSION, BENIGN ESSENTIAL   Acute metabolic encephalopathy   Hyponatremia   CAD (coronary artery disease)   Metabolic acidosis   Protein-calorie malnutrition, severe   CURRENT MEDS:    Chlorhexidine Gluconate Cloth  6 each Topical Q0600   Darbepoetin Alfa  60 mcg Subcutaneous Q7 days   feeding supplement (NEPRO CARB STEADY)  237 mL Oral BID BM   guaiFENesin  600 mg Oral BID   heparin  5,000 Units Subcutaneous Q8H   isosorbide mononitrate  90 mg  Oral Daily   metoprolol tartrate  12.5 mg Oral BID   multivitamin  1 tablet Oral QHS   rosuvastatin  10 mg Oral Daily   sodium chloride flush  3 mL Intravenous Q12H   tamsulosin  0.4 mg Oral Daily    Waverly Ferrari Office: [phone] 04/08/2023

## 2023-04-09 DIAGNOSIS — N186 End stage renal disease: Secondary | ICD-10-CM | POA: Diagnosis not present

## 2023-04-09 LAB — CBC
HCT: 24.2 % — ABNORMAL LOW (ref 39.0–52.0)
HCT: 26.3 % — ABNORMAL LOW (ref 39.0–52.0)
Hemoglobin: 8.4 g/dL — ABNORMAL LOW (ref 13.0–17.0)
Hemoglobin: 8.9 g/dL — ABNORMAL LOW (ref 13.0–17.0)
MCH: 31.5 pg (ref 26.0–34.0)
MCH: 31 pg (ref 26.0–34.0)
MCHC: 34.7 g/dL (ref 30.0–36.0)
MCHC: 33.8 g/dL (ref 30.0–36.0)
MCV: 90.6 fL (ref 80.0–100.0)
MCV: 91.6 fL (ref 80.0–100.0)
Platelets: 172 10*3/uL (ref 150–400)
Platelets: 179 10*3/uL (ref 150–400)
RBC: 2.67 MIL/uL — ABNORMAL LOW (ref 4.22–5.81)
RBC: 2.87 MIL/uL — ABNORMAL LOW (ref 4.22–5.81)
RDW: 13.8 % (ref 11.5–15.5)
RDW: 13.9 % (ref 11.5–15.5)
WBC: 5.5 10*3/uL (ref 4.0–10.5)
WBC: 5.3 10*3/uL (ref 4.0–10.5)
nRBC: 0 % (ref 0.0–0.2)
nRBC: 0 % (ref 0.0–0.2)

## 2023-04-09 LAB — MAGNESIUM: Magnesium: 1.6 mg/dL — ABNORMAL LOW (ref 1.7–2.4)

## 2023-04-09 LAB — BASIC METABOLIC PANEL
Anion gap: 10 (ref 5–15)
BUN: 45 mg/dL — ABNORMAL HIGH (ref 8–23)
CO2: 26 mmol/L (ref 22–32)
Calcium: 7.7 mg/dL — ABNORMAL LOW (ref 8.9–10.3)
Chloride: 97 mmol/L — ABNORMAL LOW (ref 98–111)
Creatinine, Ser: 3.45 mg/dL — ABNORMAL HIGH (ref 0.61–1.24)
GFR, Estimated: 16 mL/min — ABNORMAL LOW (ref >60)
Glucose, Bld: 84 mg/dL (ref 70–99)
Potassium: 3.2 mmol/L — ABNORMAL LOW (ref 3.5–5.1)
Sodium: 133 mmol/L — ABNORMAL LOW (ref 135–145)

## 2023-04-09 LAB — RENAL FUNCTION PANEL
Albumin: 2.4 g/dL — ABNORMAL LOW (ref 3.5–5.0)
Anion gap: 9 (ref 5–15)
BUN: 46 mg/dL — ABNORMAL HIGH (ref 8–23)
CO2: 25 mmol/L (ref 22–32)
Calcium: 7.9 mg/dL — ABNORMAL LOW (ref 8.9–10.3)
Chloride: 97 mmol/L — ABNORMAL LOW (ref 98–111)
Creatinine, Ser: 3.64 mg/dL — ABNORMAL HIGH (ref 0.61–1.24)
GFR, Estimated: 15 mL/min — ABNORMAL LOW (ref >60)
Glucose, Bld: 117 mg/dL — ABNORMAL HIGH (ref 70–99)
Phosphorus: 2.8 mg/dL (ref 2.5–4.6)
Potassium: 3.3 mmol/L — ABNORMAL LOW (ref 3.5–5.1)
Sodium: 131 mmol/L — ABNORMAL LOW (ref 135–145)

## 2023-04-09 LAB — PHOSPHORUS: Phosphorus: 3 mg/dL (ref 2.5–4.6)

## 2023-04-09 MED ORDER — PENTAFLUOROPROP-TETRAFLUOROETH EX AERO: 1.0000 | INHALATION_SPRAY | CUTANEOUS | Status: DC | PRN

## 2023-04-09 MED ORDER — CHLORHEXIDINE GLUCONATE CLOTH 2 % EX PADS: 6.0000 | MEDICATED_PAD | Freq: Every day | CUTANEOUS | Status: DC

## 2023-04-09 MED ORDER — ANTICOAGULANT SODIUM CITRATE 4% (200MG/5ML) IV SOLN: 5.0000 mL | Status: DC | PRN

## 2023-04-09 MED ORDER — METOPROLOL TARTRATE 25 MG PO TABS
25.0000 mg | ORAL_TABLET | Freq: Two times a day (BID) | ORAL | Status: DC
  Administered 2023-04-09 (×2): 25 mg via ORAL
  Filled 2023-04-09 (×2): qty 1

## 2023-04-09 MED ORDER — ALTEPLASE 2 MG IJ SOLR: 2.0000 mg | Freq: Once | INTRAMUSCULAR | Status: DC | PRN

## 2023-04-09 MED ORDER — LIDOCAINE-PRILOCAINE 2.5-2.5 % EX CREA: 1.0000 | TOPICAL_CREAM | CUTANEOUS | Status: DC | PRN

## 2023-04-09 MED ORDER — HEPARIN SODIUM (PORCINE) 1000 UNIT/ML DIALYSIS: 1000.0000 [IU] | INTRAMUSCULAR | Status: DC | PRN

## 2023-04-09 MED ORDER — LIDOCAINE HCL (PF) 1 % IJ SOLN: 5.0000 mL | INTRAMUSCULAR | Status: DC | PRN

## 2023-04-09 NOTE — Progress Notes (Signed)
PROGRESS NOTE    Pedro Young  ZOX:[id number] DOB: 08-09-1933 DOA: 04/06/2023 PCP: Maudie Flakes, FNP   Brief Narrative:  89 y.o. male with PMH significant fo HTN, HLD, CAD, and CKD V not yet on dialysis with an AV fistula ready on left upper extremity. Patient has progressively worsening oral intake, generalized weakness and has had frequent falls, fell about 3 times in the last 2 weeks.  Recently also developed nonproductive cough, shortness of breath, wheezing - chest x-ray patient was clear.  Family also reported involuntary jerking, unsteadiness upon standing and generalized weakness.  On 6/3 patient was found on the floor without loss of consciousness and brought to the hospital for further evaluation.  Upon admission noted to have AKI on CKD stage V and metabolic acidosis.  Nephrology consulted with hemodialysis.  TOC working on outpatient hemodialysis   Assessment & Plan:  Principal Problem:   Acute renal failure superimposed on stage 5 chronic kidney disease, not on chronic dialysis (HCC) Active Problems:   Anemia of chronic renal failure, stage 4 (severe) (HCC)   HYPERTENSION, BENIGN ESSENTIAL   Acute metabolic encephalopathy   Hyponatremia   CAD (coronary artery disease)   Metabolic acidosis   Protein-calorie malnutrition, severe    AKI on CKD V > ESRD Acute on chronic metabolic acidosis Uremia Worsening renal function.  Creatinine now up to 7.32, BUN 122 with metabolic acidosis.  Patient is now on hemodialysis, creatinine slowly improving.  Tolerating HD via left upper extremity AV fistula.    Hyponatremia  Stable around 130  Hypotension H/o essential hypertension Blood pressure on lower side of normal.  PTA on metoprolol 50 mg twice daily, amlodipine 10 mg daily, Lasix PRN.  Currently on hold Increase metoprolol 25 mg twice daily, Imdur 90 mg daily.  IV as needed.   CAD/HLD Continue home statin   Anemia of chronic disease Hemoglobin around baseline of 9.   Iron and Aranesp  BPH Flomax   Generalized weakness PT eval-Home health.  Face-to-face completed   DVT prophylaxis: Subcu heparin Code Status: Full code Family Communication: Daughter at bedside Status is: Inpatient Continue hospital stay until cleared by nephrology. Nephro team working on outpatient HD arrangements      Diet Orders (From admission, onward)     Start     Ordered   04/06/23 2027  Diet renal with fluid restriction Fluid restriction: 1200 mL Fluid; Room service appropriate? Yes; Fluid consistency: Thin  Diet effective now       Question Answer Comment  Fluid restriction: 1200 mL Fluid   Room service appropriate? Yes   Fluid consistency: Thin      04/06/23 2027            Subjective: No complaints doing okay Yesterday evening had confusion likely secondary to delirium but much calmer this morning  Examination: Constitutional: Not in acute distress Respiratory: Clear to auscultation bilaterally Cardiovascular: Normal sinus rhythm, no rubs Abdomen: Nontender nondistended good bowel sounds Musculoskeletal: No edema noted Skin: No rashes seen Neurologic: CN 2-12 grossly intact.  And nonfocal Psychiatric: Normal judgment and insight. Alert and oriented x 3. Normal mood.   AV fistula noted left upper extremity Objective: Vitals:   04/08/23 1742 04/08/23 1827 04/08/23 2147 04/09/23 0439  BP:  (!) 139/101 (!) 164/80 (!) 147/68  Pulse:  (!) 103 92 100  Resp:  18 18 18   Temp:  97.8 F (36.6 C) 97.6 F (36.4 C) 97.9 F (36.6 C)  TempSrc:  Oral Oral Oral  SpO2:  100% 98% 99%  Weight: 51.3 kg       Intake/Output Summary (Last 24 hours) at 04/09/2023 0729 Last data filed at 04/09/2023 0424 Gross per 24 hour  Intake 510.07 ml  Output 0 ml  Net 510.07 ml   Filed Weights   04/07/23 1730 04/08/23 1419 04/08/23 1742  Weight: 53 kg 50.7 kg 51.3 kg    Scheduled Meds:  Chlorhexidine Gluconate Cloth  6 each Topical Q0600   Darbepoetin Alfa  60 mcg  Subcutaneous Q7 days   feeding supplement (NEPRO CARB STEADY)  237 mL Oral BID BM   guaiFENesin  600 mg Oral BID   heparin  5,000 Units Subcutaneous Q8H   isosorbide mononitrate  90 mg Oral Daily   metoprolol tartrate  12.5 mg Oral BID   multivitamin  1 tablet Oral QHS   rosuvastatin  10 mg Oral Daily   sodium chloride flush  3 mL Intravenous Q12H   tamsulosin  0.4 mg Oral Daily   Continuous Infusions:  ferric gluconate (FERRLECIT) IVPB Stopped (04/08/23 1709)    Nutritional status Signs/Symptoms: severe fat depletion, severe muscle depletion, energy intake < 75% for > or equal to 1 month Interventions: Nepro shake, MVI Body mass index is 18.82 kg/m.  Data Reviewed:   CBC: Recent Labs  Lab 04/06/23 1557 04/07/23 0454 04/08/23 0748 04/08/23 1238  WBC 5.8 4.7 7.2 6.5  HGB 8.9* 8.7* 8.3* 8.6*  HCT 25.7* 26.0* 24.0* 25.7*  MCV 90.2 90.9 90.6 92.1  PLT 196 169 179 194   Basic Metabolic Panel: Recent Labs  Lab 04/06/23 1557 04/07/23 0454 04/08/23 0748 04/08/23 1238  NA 127* 128* 129* 130*  K 4.8 4.8 4.1 3.9  CL 97* 97* 96* 98  CO2 17* 15* 18* 17*  GLUCOSE 86 73 96 103*  BUN 123* 122* 94* 92*  CREATININE 7.37* 7.32* 5.95* 5.79*  CALCIUM 7.4* 7.6* 7.7* 7.8*  PHOS  --  7.1*  --  5.0*   GFR: Estimated Creatinine Clearance: 6.4 mL/min (A) (by C-G formula based on SCr of 5.79 mg/dL (H)). Liver Function Tests: Recent Labs  Lab 04/08/23 1238  ALBUMIN 2.5*   No results for input(s): "LIPASE", "AMYLASE" in the last 168 hours. No results for input(s): "AMMONIA" in the last 168 hours. Coagulation Profile: No results for input(s): "INR", "PROTIME" in the last 168 hours. Cardiac Enzymes: No results for input(s): "CKTOTAL", "CKMB", "CKMBINDEX", "TROPONINI" in the last 168 hours. BNP (last 3 results) No results for input(s): "PROBNP" in the last 8760 hours. HbA1C: No results for input(s): "HGBA1C" in the last 72 hours. CBG: Recent Labs  Lab 04/07/23 0729  04/07/23 1206  GLUCAP 63* 90   Lipid Profile: No results for input(s): "CHOL", "HDL", "LDLCALC", "TRIG", "CHOLHDL", "LDLDIRECT" in the last 72 hours. Thyroid Function Tests: No results for input(s): "TSH", "T4TOTAL", "FREET4", "T3FREE", "THYROIDAB" in the last 72 hours. Anemia Panel: Recent Labs    04/07/23 0454  TIBC 176*  IRON 41*   Sepsis Labs: No results for input(s): "PROCALCITON", "LATICACIDVEN" in the last 168 hours.  Recent Results (from the past 240 hour(s))  Resp panel by RT-PCR (RSV, Flu A&B, Covid) Anterior Nasal Swab     Status: None   Collection Time: 04/06/23  3:55 PM   Specimen: Anterior Nasal Swab  Result Value Ref Range Status   SARS Coronavirus 2 by RT PCR NEGATIVE NEGATIVE Final    Comment: (NOTE) SARS-CoV-2 target nucleic acids are NOT DETECTED.  The SARS-CoV-2 RNA is  generally detectable in upper respiratory specimens during the acute phase of infection. The lowest concentration of SARS-CoV-2 viral copies this assay can detect is 138 copies/mL. A negative result does not preclude SARS-Cov-2 infection and should not be used as the sole basis for treatment or other patient management decisions. A negative result may occur with  improper specimen collection/handling, submission of specimen other than nasopharyngeal swab, presence of viral mutation(s) within the areas targeted by this assay, and inadequate number of viral copies(<138 copies/mL). A negative result must be combined with clinical observations, patient history, and epidemiological information. The expected result is Negative.  Fact Sheet for Patients:  BloggerCourse.com  Fact Sheet for Healthcare Providers:  SeriousBroker.it  This test is no t yet approved or cleared by the [place] FDA and  has been authorized for detection and/or diagnosis of SARS-CoV-2 by FDA under an Emergency Use Authorization (EUA). This EUA will remain  in  effect (meaning this test can be used) for the duration of the COVID-19 declaration under Section 564(b)(1) of the Act, 21 U.S.C.section 360bbb-3(b)(1), unless the authorization is terminated  or revoked sooner.       Influenza A by PCR NEGATIVE NEGATIVE Final   Influenza B by PCR NEGATIVE NEGATIVE Final    Comment: (NOTE) The Xpert Xpress SARS-CoV-2/FLU/RSV plus assay is intended as an aid in the diagnosis of influenza from Nasopharyngeal swab specimens and should not be used as a sole basis for treatment. Nasal washings and aspirates are unacceptable for Xpert Xpress SARS-CoV-2/FLU/RSV testing.  Fact Sheet for Patients: BloggerCourse.com  Fact Sheet for Healthcare Providers: SeriousBroker.it  This test is not yet approved or cleared by the [place] FDA and has been authorized for detection and/or diagnosis of SARS-CoV-2 by FDA under an Emergency Use Authorization (EUA). This EUA will remain in effect (meaning this test can be used) for the duration of the COVID-19 declaration under Section 564(b)(1) of the Act, 21 U.S.C. section 360bbb-3(b)(1), unless the authorization is terminated or revoked.     Resp Syncytial Virus by PCR NEGATIVE NEGATIVE Final    Comment: (NOTE) Fact Sheet for Patients: BloggerCourse.com  Fact Sheet for Healthcare Providers: SeriousBroker.it  This test is not yet approved or cleared by the [place] FDA and has been authorized for detection and/or diagnosis of SARS-CoV-2 by FDA under an Emergency Use Authorization (EUA). This EUA will remain in effect (meaning this test can be used) for the duration of the COVID-19 declaration under Section 564(b)(1) of the Act, 21 U.S.C. section 360bbb-3(b)(1), unless the authorization is terminated or revoked.  Performed at [facility], [address] [place], [place] [address]           Radiology Studies: DG Swallowing [facility] Pathology  Result Date: 04/08/2023 Table formatting from the original result was not included. Modified Barium Swallow Study Patient Details Name: Yeshayahu Myron MRN: [id number] Date of Birth: July 13, 1933 Today's Date: 04/08/2023 HPI/PMH: HPI: 89 yo male presents to ED on 6/3 with fall, weakness, SOB. PMH includes HTN, CKD, preDM, CKD V with HD cath placed but not yet on HD. CT No active cardiopulmonary disease. Clinical Impression: Clinical Impression: Pt exhibited functional oral control, cohesion, mastication and transit across textures. Minimal pharyngeal phase dysphagia without penetration or aspiration. There was mild valleculae and pyriform sinus residue with thin, nectar and solid that he sensed and swallowed spontaneously to clear. He appears to have cervical  and possibly thoracic osteophytes with minimal retention without retrograde movement. There was no coughing during MBS  however clinically he does cough when eating which may be from possible esophgeal retention during meals. The barium pill transited esophagus without difficulty. Recommend he continue regular texture, thin liquids, straw allowed, pills with thin, stay upright after meals and alternate liquids and solids. No further ST needed. Factors that may increase risk of adverse event in presence of aspiration [name]: No data recorded Recommendations/Plan: Swallowing Evaluation Recommendations Swallowing Evaluation Recommendations Recommendations: PO diet PO Diet Recommendation: Regular; Thin liquids (Level 0) Liquid Administration via: Straw; Cup Medication Administration: Whole meds with liquid Supervision: Patient able to self-feed Swallowing strategies  : Slow rate; Small bites/sips Postural changes: Position pt fully upright for meals Oral care recommendations: Oral care BID (2x/day) Treatment Plan Treatment Plan Treatment recommendations: No treatment recommended at this  time Follow-up recommendations: No SLP follow up Functional status assessment: Patient has not had a recent decline in their functional status. Recommendations Recommendations for follow up therapy are one component of a multi-disciplinary discharge planning process, led by the attending physician.  Recommendations may be updated based on patient status, additional functional criteria and insurance authorization. Assessment: Orofacial Exam: Orofacial Exam Oral Cavity: Oral Hygiene: WFL Oral Cavity - Dentition: Adequate natural dentition Orofacial Anatomy: WFL Oral Motor/Sensory Function: WFL Anatomy: Anatomy: Suspected cervical osteophytes Boluses Administered: Boluses Administered Boluses Administered: Thin liquids (Level 0); Mildly thick liquids (Level 2, nectar thick); Moderately thick liquids (Level 3, honey thick); Puree; Solid  Oral Impairment Domain: Oral Impairment Domain Lip Closure: No labial escape Tongue control during bolus hold: Cohesive bolus between tongue to palatal seal Bolus preparation/mastication: Timely and efficient chewing and mashing Bolus transport/lingual motion: Brisk tongue motion Oral residue: Complete oral clearance Location of oral residue : N/A Initiation of pharyngeal swallow : Valleculae  Pharyngeal Impairment Domain: Pharyngeal Impairment Domain Soft palate elevation: No bolus between soft palate (SP)/pharyngeal wall (PW) Laryngeal elevation: Complete superior movement of thyroid cartilage with complete approximation of arytenoids to epiglottic petiole Anterior hyoid excursion: Complete anterior movement Epiglottic movement: Complete inversion Laryngeal vestibule closure: Complete, no air/contrast in laryngeal vestibule Pharyngeal stripping wave : Present - complete Pharyngeal contraction (A/P view only): N/A Pharyngoesophageal segment opening: Partial distention/partial duration, partial obstruction of flow Tongue base retraction: No contrast between tongue base and posterior  pharyngeal wall (PPW) Pharyngeal residue: Collection of residue within or on pharyngeal structures Location of pharyngeal residue: Valleculae; Pyriform sinuses; Tongue base  Esophageal Impairment Domain: Esophageal Impairment Domain Esophageal clearance upright position: Esophageal retention (trace) Pill: Esophageal Impairment Domain Esophageal clearance upright position: Esophageal retention (trace) Penetration/Aspiration Scale Score: Penetration/Aspiration Scale Score 1.  Material does not enter airway: Thin liquids (Level 0); Mildly thick liquids (Level 2, nectar thick); Moderately thick liquids (Level 3, honey thick); Puree; Solid; Pill Compensatory Strategies: Compensatory Strategies Compensatory strategies: No   General Information: Caregiver present: No  Diet Prior to this Study: Regular; Thin liquids (Level 0)   Temperature : Normal   Respiratory Status: WFL   Supplemental O2: None (Room air)   History of Recent Intubation: No  Behavior/Cognition: Alert; Cooperative; Pleasant mood Self-Feeding Abilities: Able to self-feed Baseline vocal quality/speech: Normal Volitional Cough: Able to elicit Volitional Swallow: Able to elicit No data recorded Goal Planning: Prognosis for improved oropharyngeal function: Good No data recorded No data recorded No data recorded Consulted and agree with results and recommendations: Patient; Family member/caregiver Pain: Pain Assessment Pain Assessment: No/denies pain Faces Pain Scale: 0 Pain Intervention(s): Monitored during session End of Session: Start Time:SLP Start Time (ACUTE ONLY): 1027  Stop Time: SLP Stop Time (ACUTE ONLY): 1038 Time Calculation:SLP Time Calculation (min) (ACUTE ONLY): 11 min Charges: SLP Evaluations $ SLP Speech Visit: 1 Visit SLP Evaluations $BSS Swallow: 1 Procedure $MBS Swallow: 1 Procedure SLP visit diagnosis: SLP Visit Diagnosis: Dysphagia, pharyngoesophageal phase (R13.14) Past Medical History: Past Medical History: Diagnosis Date  Cancer (HCC)    Hypertension   Renal disorder   CRF Past Surgical History: Past Surgical History: Procedure Laterality Date  AV FISTULA PLACEMENT Left 05/04/2019  Procedure: Arteriovenous (Av) Fistula Creation Left Arm;  Surgeon: Larina Earthly, MD;  Location: [facility] OR;  Service: Vascular;  Laterality: Left;  BIOPSY  08/10/2019  Procedure: BIOPSY;  Surgeon: Lynann Bologna, MD;  Location: [facility] ENDOSCOPY;  Service: Endoscopy;;  CHOLECYSTECTOMY    COLONOSCOPY N/A 08/10/2019  Procedure: COLONOSCOPY;  Surgeon: Lynann Bologna, MD;  Location: [facility] ENDOSCOPY;  Service: Endoscopy;  Laterality: N/A;  EYE SURGERY Bilateral   cataract  LEFT HEART CATH AND CORONARY ANGIOGRAPHY N/A 06/19/2020  Procedure: LEFT HEART CATH AND CORONARY ANGIOGRAPHY;  Surgeon: Lyn Records, MD;  Location: [facility] INVASIVE CV LAB;  Service: Cardiovascular;  Laterality: N/A;  POLYPECTOMY  08/10/2019  Procedure: POLYPECTOMY;  Surgeon: Lynann Bologna, MD;  Location: [facility];  Service: Endoscopy;; [name] 04/08/2023, 11:21 AM          LOS: 3 days   Time spent= 35 mins    Armentha Branagan Joline Maxcy, MD [facility]  If 7PM-7AM, please contact night-coverage  04/09/2023, 7:29 AM

## 2023-04-09 NOTE — Progress Notes (Addendum)
Contacted [facility] admissions to request an update on pt's referral. Awaiting response.   Olivia Canter Renal Navigator [phone]  Addendum at 2:18 pm: Pt's referral is still pending.

## 2023-04-09 NOTE — Progress Notes (Signed)
   04/09/23 1420  Vitals  Temp 98 F (36.7 C)  Temp Source Oral  BP 132/62  BP Location Right Arm  BP Method Automatic  Patient Position (if appropriate) Lying  Pulse Rate 88  Resp 18  Oxygen Therapy  SpO2 98 %  O2 Device Room Air  During Treatment Monitoring  Intra-Hemodialysis Comments See progress note  Post Treatment  Dialyzer Clearance Other (Comment)  Duration of HD Treatment -hour(s) 0.5 hour(s)  Hemodialysis Intake (mL) 0 mL  Liters Processed 10  Fluid Removed (mL) 0 mL  Tolerated HD Treatment No (Comment)  Post-Hemodialysis Comments Pt venous needle side infiltrated  when HD  sart after 30 mins. blood unable to reture. Ice bag placed. Dr Ronalee Belts notified.Report given to [facility]. Desiree RN.  AVG/AVF Arterial Site Held (minutes) 10 minutes  AVG/AVF Venous Site Held (minutes) 10 minutes  Fistula / Graft Left Forearm Arteriovenous fistula  Placement Date/Time: 05/04/19 1137   Placed prior to admission: No  Orientation: Left  Access Location: Forearm  Access Type: Arteriovenous fistula  Site Condition No complications  Fistula / Graft Assessment Present;Thrill;Bruit  Status Deaccessed  Needle Size 16  Drainage Description None

## 2023-04-09 NOTE — Plan of Care (Signed)

## 2023-04-09 NOTE — Evaluation (Signed)
Occupational Therapy Evaluation Patient Details Name: Pedro Young MRN: [id number] DOB: 04-Dec-1932 Today's Date: 04/09/2023   History of Present Illness 89 yo male presents to ED on 6/3 with fall, weakness, SOB. PMH includes HTN, CKD, preDM, CKD V with HD cath placed but not yet on HD.   Clinical Impression   PTA, pt lived with wife and daughter and was performing ADL independently. Upon eval, daughter with max concerns regarding pt confusion. Educating regarding how to orient pt and reduce risk of hospital related delirium. Pt performing ADL with up to min guard A. Slowed processing, decreased problem solving, memory, awareness, strength, balance, and orientation. Will continue to follow and recommending follow up in pt's natural setting after discharge. Recommending BSC as well to minimize falls during night time trips to the restroom.      Recommendations for follow up therapy are one component of a multi-disciplinary discharge planning process, led by the attending physician.  Recommendations may be updated based on patient status, additional functional criteria and insurance authorization.   Assistance Recommended at Discharge Frequent or constant Supervision/Assistance  Patient can return home with the following A little help with walking and/or transfers;A little help with bathing/dressing/bathroom;Assistance with cooking/housework;Direct supervision/assist for medications management;Direct supervision/assist for financial management;Assist for transportation;Help with stairs or ramp for entrance    Functional Status Assessment  Patient has had a recent decline in their functional status and demonstrates the ability to make significant improvements in function in a reasonable and predictable amount of time.  Equipment Recommendations  BSC/3in1    Recommendations for Other Services       Precautions / Restrictions Precautions Precautions: Fall Restrictions Weight Bearing  Restrictions: No      Mobility Bed Mobility Overal bed mobility: Needs Assistance Bed Mobility: Supine to Sit, Sit to Supine     Supine to sit: HOB elevated, Supervision Sit to supine: HOB elevated, Supervision   General bed mobility comments: supervision for safety, increased time and effort and use of bed rail but no physical assist required    Transfers Overall transfer level: Needs assistance Equipment used: Rolling walker (2 wheels) Transfers: Sit to/from Stand Sit to Stand: Min guard           General transfer comment: min guard for safety, initially steadies himself by bracing his LE on the EoB, does not need assist. Approaching supervision      Balance Overall balance assessment: Needs assistance Sitting-balance support: No upper extremity supported, Feet supported Sitting balance-Leahy Scale: Good Sitting balance - Comments: able to achieve figure 4 EOB   Standing balance support: Bilateral upper extremity supported, During functional activity Standing balance-Leahy Scale: Poor Standing balance comment: requires UE support                           ADL either performed or assessed with clinical judgement   ADL Overall ADL's : Needs assistance/impaired Eating/Feeding: Bed level;Set up Eating/Feeding Details (indicate cue type and reason): daughter opening containers Grooming: Oral care;Wash/dry face;Min guard;Standing;Supervision/safety Grooming Details (indicate cue type and reason): min guard-supervision for static standing at sink Upper Body Bathing: Set up;Sitting   Lower Body Bathing: Sitting/lateral leans;Min guard;Sit to/from stand   Upper Body Dressing : Set up;Sitting   Lower Body Dressing: Supervision/safety;Sitting/lateral leans;Set up Lower Body Dressing Details (indicate cue type and reason): to doff and don socks Toilet Transfer: Min guard;Rolling walker (2 wheels);Ambulation Toilet Transfer Details (indicate cue type and  reason): slowed pace of ambulatiuon.  Min guard A approaching supervision for safety         Functional mobility during ADLs: Min guard;Rolling walker (2 wheels) General ADL Comments: Daughter reporting confusion. Providing education regarding how to orient patient as well as minimize likelihood of confusion including blinds open in day/lights off at night. Watching news, informing of things going on at home and with the family/asking orientation questions frequently.     Vision Ability to See in Adequate Light: 0 Adequate Patient Visual Report: No change from baseline Vision Assessment?: No apparent visual deficits Additional Comments: Able to read room numbers and date on board as well as locate grooming items at sink     Perception     Praxis      Pertinent Vitals/Pain Pain Assessment Pain Assessment: No/denies pain     Hand Dominance Right   Extremity/Trunk Assessment Upper Extremity Assessment Upper Extremity Assessment: Generalized weakness   Lower Extremity Assessment Lower Extremity Assessment: Defer to PT evaluation   Cervical / Trunk Assessment Cervical / Trunk Assessment: Normal   Communication Communication Communication: No difficulties;Prefers language other than [place] (Daughter reports pt typically fluent in [place] and spanish, but over the past few days has been needing interpretation for some english. Interpretation utiolized ~25% of session for basic ADL)   Cognition Arousal/Alertness: Awake/alert Behavior During Therapy: WFL for tasks assessed/performed Overall Cognitive Status: Impaired/Different from baseline Area of Impairment: Orientation, Following commands, Safety/judgement, Problem solving, Memory, Awareness                 Orientation Level: Disoriented to, Place, Situation, Time ("I think this looks like a hospital after daughter oriented him right after arrival." Year: "24 or 23". Month: "December")   Memory: Decreased short-term  memory Following Commands: Follows one step commands with increased time Safety/Judgement: Decreased awareness of deficits Awareness: Intellectual Problem Solving: Difficulty sequencing, Requires verbal cues, Requires tactile cues General Comments: Max reorientation for reason in the hospital. When asked where we are, after being oriented, continuius report "this looks kind of like a hospital", so some carry over present. Min cues for orientation to year and max for month. Pt following one step commands with incrsaed time. Pleasant throughout. Cues for safe use and placement of RW during return to sitting position and at sink during grooming     General Comments  VSS on RA; daughter present. Providing education regarding how to adjust home RW    Exercises     Shoulder Instructions      Home Living Family/patient expects to be discharged to:: Private residence Living Arrangements: Spouse/significant other;Children (daughter) Available Help at Discharge: Family Type of Home: House Home Access: Stairs to enter Secretary/administrator of Steps: 1   Home Layout: One level     Bathroom Shower/Tub: Producer, television/film/video: Standard     Home Equipment: Cane - single Librarian, academic (2 wheels);Wheelchair - manual   Additional Comments: equipment is his wife's, but she is not currently using      Prior Functioning/Environment Prior Level of Function : Independent/Modified Independent             Mobility Comments: up until the last 3 days, pt ambulatory without AD ADLs Comments: until the last 3 days, pt independent with ADLs. Per pt's daughter, pt has required assist recently        OT Problem List: Decreased strength;Impaired balance (sitting and/or standing);Decreased activity tolerance;Decreased cognition;Decreased safety awareness;Decreased knowledge of use of DME or AE  OT Treatment/Interventions: Self-care/ADL training;Therapeutic exercise;DME and/or  AE instruction;Balance training;Patient/family education;Cognitive remediation/compensation;Therapeutic activities    OT Goals(Current goals can be found in the care plan section) Acute Rehab OT Goals Patient Stated Goal: go home OT Goal Formulation: With patient Time For Goal Achievement: 04/23/23 Potential to Achieve Goals: Good  OT Frequency: Min 2X/week    Co-evaluation              AM-PAC OT "6 Clicks" Daily Activity     Outcome Measure Help from another person eating meals?: A Little Help from another person taking care of personal grooming?: A Little Help from another person toileting, which includes using toliet, bedpan, or urinal?: A Little Help from another person bathing (including washing, rinsing, drying)?: A Little Help from another person to put on and taking off regular upper body clothing?: A Little Help from another person to put on and taking off regular lower body clothing?: A Little 6 Click Score: 18   End of Session Equipment Utilized During Treatment: Gait belt;Rolling walker (2 wheels) Nurse Communication: Mobility status  Activity Tolerance: Patient tolerated treatment well Patient left: in bed;with call bell/phone within reach;with bed alarm set;with family/visitor present  OT Visit Diagnosis: Unsteadiness on feet (R26.81);Muscle weakness (generalized) (M62.81);Other symptoms and signs involving cognitive function                Time: 1610-9604 OT Time Calculation (min): 29 min Charges:  OT General Charges $OT Visit: 1 Visit OT Evaluation $OT Eval Low Complexity: 1 Low OT Treatments $Self Care/Home Management : 8-22 mins  Tyler Deis, OTR/L [facility] Acute Rehabilitation Office: [phone]   Myrla Halsted 04/09/2023, 9:10 AM

## 2023-04-09 NOTE — Care Management Important Message (Signed)
Important Message  Patient Details  Name: Pedro Young MRN: [id number] Date of Birth: September 11, 1933   Medicare Important Message Given:  Yes     Sian Joles 04/09/2023, 1:31 PM

## 2023-04-09 NOTE — Progress Notes (Addendum)
VASCULAR SURGERY:  His left forearm fistula has a good thrill.  His left AV fistula has now worked well twice for dialysis.  We will hold off on a fistulogram.  Please let us know if he has any further issues with his fistula.  Vascular surgery will be available as needed.  Cari Caraway, MD 7:17 AM

## 2023-04-09 NOTE — Progress Notes (Signed)
Subjective:  Patient notes that he feels improved this morning after HD yesterday. He and his daughter remain hopeful for continued improvement of his symptoms with further HD sessions.  Objective Vital signs in last 24 hours: Vitals:   04/08/23 1827 04/08/23 2147 04/09/23 0439 04/09/23 0741  BP: (!) 139/101 (!) 164/80 (!) 147/68 (!) 152/66  Pulse: (!) 103 92 100 94  Resp: 18 18 18 16   Temp: 97.8 F (36.6 C) 97.6 F (36.4 C) 97.9 F (36.6 C) 98 F (36.7 C)  TempSrc: Oral Oral Oral Oral  SpO2: 100% 98% 99% 100%  Weight:       Weight change: -2.2 kg  Intake/Output Summary (Last 24 hours) at 04/09/2023 0743 Last data filed at 04/09/2023 0424 Gross per 24 hour  Intake 510.07 ml  Output 0 ml  Net 510.07 ml    Assessment/ Plan: Pedro Young is a 89 y.o. 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   ESRD: Patient successfully tolerated 2.5h HD yesterday. Unlike the previous day, he does remember sitting for HD yesterday. He expressed discomfort at his access site during the treatment but otherwise no complaints. This morning he appears to feel much better on exam and continues to have improvement with presenting uremic symptoms, with main concern at this point being ongoing intermittent confusion per his daughter. Urinary frequency has improved as well. Labs today show continued improvement of renal function, BUN/Cr 45/3.45 (92/5.79). Plan today for additional HD session. Vascular access: Successful access of existing LUE AVF for HD yesterday. No plan for fistulogram at this time. Anemia: In setting of CKD stage 5/ESRD. Hgb today 8.4, stable. Plan to continue daily iron infusion, weekly aranesp. Trend CBC, transfuse for HGB <7. Metabolic bone disease of CKD: iPTH and calcium pending. Phosphorous now normalized with HD. Electrolyte derangements: Mild hyponatremia, improved to 133. Mild hypokalemia at 3.2. Will address hypokalemia with  HD today. Trend BMP. HTN/volume: Mild hypertension. No concern for hypervolemia. On imdur 90 mg daily, metoprolol 12.5 mg BID.  Per primary. Disposition: Renal Navigator assisting in OP HD placement. Anticipate he will be ready for discharge once OP HD needs have been confirmed.  Labs: Basic Metabolic Panel: Recent Labs  Lab 04/07/23 0454 04/08/23 0748 04/08/23 1238  NA 128* 129* 130*  K 4.8 4.1 3.9  CL 97* 96* 98  CO2 15* 18* 17*  GLUCOSE 73 96 103*  BUN 122* 94* 92*  CREATININE 7.32* 5.95* 5.79*  CALCIUM 7.6* 7.7* 7.8*  PHOS 7.1*  --  5.0*   Liver Function Tests: Recent Labs  Lab 04/08/23 1238  ALBUMIN 2.5*   No results for input(s): "LIPASE", "AMYLASE" in the last 168 hours. No results for input(s): "AMMONIA" in the last 168 hours.  CBC: Recent Labs  Lab 04/06/23 1557 04/07/23 0454 04/08/23 0748 04/08/23 1238  WBC 5.8 4.7 7.2 6.5  HGB 8.9* 8.7* 8.3* 8.6*  HCT 25.7* 26.0* 24.0* 25.7*  MCV 90.2 90.9 90.6 92.1  PLT 196 169 179 194   Cardiac Enzymes: No results for input(s): "CKTOTAL", "CKMB", "CKMBINDEX", "TROPONINI" in the last 168 hours.  CBG: Recent Labs  Lab 04/07/23 0729 04/07/23 1206  GLUCAP 63* 90   Iron Studies:  Recent Labs    04/07/23 0454  IRON 41*  TIBC 176*   Studies/Results: DG Swallowing Func-Speech Pathology  Result Date: 04/08/2023 Table formatting from the original result was not included. Modified Barium Swallow Study Patient Details Name:  Pedro Young MRN: [id number] Date of Birth: 1933/02/12 Today's Date: 04/08/2023 HPI/PMH: HPI: 89 yo male presents to ED on 6/3 with fall, weakness, SOB. PMH includes HTN, CKD, preDM, CKD V with HD cath placed but not yet on HD. CT No active cardiopulmonary disease. Clinical Impression: Clinical Impression: Pt exhibited functional oral control, cohesion, mastication and transit across textures. Minimal pharyngeal phase dysphagia without penetration or aspiration. There was mild valleculae and pyriform  sinus residue with thin, nectar and solid that he sensed and swallowed spontaneously to clear. He appears to have cervical  and possibly thoracic osteophytes with minimal retention without retrograde movement. There was no coughing during MBS however clinically he does cough when eating which may be from possible esophgeal retention during meals. The barium pill transited esophagus without difficulty. Recommend he continue regular texture, thin liquids, straw allowed, pills with thin, stay upright after meals and alternate liquids and solids. No further ST needed. Factors that may increase risk of adverse event in presence of aspiration [name]: No data recorded Recommendations/Plan: Swallowing Evaluation Recommendations Swallowing Evaluation Recommendations Recommendations: PO diet PO Diet Recommendation: Regular; Thin liquids (Level 0) Liquid Administration via: Straw; Cup Medication Administration: Whole meds with liquid Supervision: Patient able to self-feed Swallowing strategies  : Slow rate; Small bites/sips Postural changes: Position pt fully upright for meals Oral care recommendations: Oral care BID (2x/day) Treatment Plan Treatment Plan Treatment recommendations: No treatment recommended at this time Follow-up recommendations: No SLP follow up Functional status assessment: Patient has not had a recent decline in their functional status. Recommendations Recommendations for follow up therapy are one component of a multi-disciplinary discharge planning process, led by the attending physician.  Recommendations may be updated based on patient status, additional functional criteria and insurance authorization. Assessment: Orofacial Exam: Orofacial Exam Oral Cavity: Oral Hygiene: WFL Oral Cavity - Dentition: Adequate natural dentition Orofacial Anatomy: WFL Oral Motor/Sensory Function: WFL Anatomy: Anatomy: Suspected cervical osteophytes Boluses Administered: Boluses Administered Boluses  Administered: Thin liquids (Level 0); Mildly thick liquids (Level 2, nectar thick); Moderately thick liquids (Level 3, honey thick); Puree; Solid  Oral Impairment Domain: Oral Impairment Domain Lip Closure: No labial escape Tongue control during bolus hold: Cohesive bolus between tongue to palatal seal Bolus preparation/mastication: Timely and efficient chewing and mashing Bolus transport/lingual motion: Brisk tongue motion Oral residue: Complete oral clearance Location of oral residue : N/A Initiation of pharyngeal swallow : Valleculae  Pharyngeal Impairment Domain: Pharyngeal Impairment Domain Soft palate elevation: No bolus between soft palate (SP)/pharyngeal wall (PW) Laryngeal elevation: Complete superior movement of thyroid cartilage with complete approximation of arytenoids to epiglottic petiole Anterior hyoid excursion: Complete anterior movement Epiglottic movement: Complete inversion Laryngeal vestibule closure: Complete, no air/contrast in laryngeal vestibule Pharyngeal stripping wave : Present - complete Pharyngeal contraction (A/P view only): N/A Pharyngoesophageal segment opening: Partial distention/partial duration, partial obstruction of flow Tongue base retraction: No contrast between tongue base and posterior pharyngeal wall (PPW) Pharyngeal residue: Collection of residue within or on pharyngeal structures Location of pharyngeal residue: Valleculae; Pyriform sinuses; Tongue base  Esophageal Impairment Domain: Esophageal Impairment Domain Esophageal clearance upright position: Esophageal retention (trace) Pill: Esophageal Impairment Domain Esophageal clearance upright position: Esophageal retention (trace) Penetration/Aspiration Scale Score: Penetration/Aspiration Scale Score 1.  Material does not enter airway: Thin liquids (Level 0); Mildly thick liquids (Level 2, nectar thick); Moderately thick liquids (Level 3, honey thick); Puree; Solid; Pill Compensatory Strategies: Compensatory Strategies  Compensatory strategies: No   General Information: Caregiver present: No  Diet Prior  to this Study: Regular; Thin liquids (Level 0)   Temperature : Normal   Respiratory Status: WFL   Supplemental O2: None (Room air)   History of Recent Intubation: No  Behavior/Cognition: Alert; Cooperative; Pleasant mood Self-Feeding Abilities: Able to self-feed Baseline vocal quality/speech: Normal Volitional Cough: Able to elicit Volitional Swallow: Able to elicit No data recorded Goal Planning: Prognosis for improved oropharyngeal function: Good No data recorded No data recorded No data recorded Consulted and agree with results and recommendations: Patient; Family member/caregiver Pain: Pain Assessment Pain Assessment: No/denies pain Faces Pain Scale: 0 Pain Intervention(s): Monitored during session End of Session: Start Time:SLP Start Time (ACUTE ONLY): 1027 Stop Time: SLP Stop Time (ACUTE ONLY): 1038 Time Calculation:SLP Time Calculation (min) (ACUTE ONLY): 11 min Charges: SLP Evaluations $ SLP Speech Visit: 1 Visit SLP Evaluations $BSS Swallow: 1 Procedure $MBS Swallow: 1 Procedure SLP visit diagnosis: SLP Visit Diagnosis: Dysphagia, pharyngoesophageal phase (R13.14) Past Medical History: Past Medical History: Diagnosis Date  Cancer (HCC)   Hypertension   Renal disorder   CRF Past Surgical History: Past Surgical History: Procedure Laterality Date  AV FISTULA PLACEMENT Left 05/04/2019  Procedure: Arteriovenous (Av) Fistula Creation Left Arm;  Surgeon: Larina Earthly, MD;  Location: [facility];  Service: Vascular;  Laterality: Left;  BIOPSY  08/10/2019  Procedure: BIOPSY;  Surgeon: Lynann Bologna, MD;  Location: [facility] ENDOSCOPY;  Service: Endoscopy;;  CHOLECYSTECTOMY    COLONOSCOPY N/A 08/10/2019  Procedure: COLONOSCOPY;  Surgeon: Lynann Bologna, MD;  Location: [facility] ENDOSCOPY;  Service: Endoscopy;  Laterality: N/A;  EYE SURGERY Bilateral   cataract  LEFT HEART CATH AND CORONARY ANGIOGRAPHY N/A 06/19/2020  Procedure: LEFT HEART CATH AND CORONARY  ANGIOGRAPHY;  Surgeon: Lyn Records, MD;  Location: [facility] INVASIVE CV LAB;  Service: Cardiovascular;  Laterality: N/A;  POLYPECTOMY  08/10/2019  Procedure: POLYPECTOMY;  Surgeon: Lynann Bologna, MD;  Location: [facility] ENDOSCOPY;  Service: Endoscopy;; [name] 04/08/2023, 11:21 AM   Medications: Infusions:  ferric gluconate (FERRLECIT) IVPB Stopped (04/08/23 1709)   Scheduled Medications:  Chlorhexidine Gluconate Cloth  6 each Topical Q0600   Darbepoetin Alfa  60 mcg Subcutaneous Q7 days   feeding supplement (NEPRO CARB STEADY)  237 mL Oral BID BM   guaiFENesin  600 mg Oral BID   heparin  5,000 Units Subcutaneous Q8H   isosorbide mononitrate  90 mg Oral Daily   metoprolol tartrate  25 mg Oral BID   multivitamin  1 tablet Oral QHS   rosuvastatin  10 mg Oral Daily   sodium chloride flush  3 mL Intravenous Q12H   tamsulosin  0.4 mg Oral Daily   I have reviewed scheduled and prn medications.  Physical Exam: Constitutional:Chronically-ill appearing elderly gentleman resting comfortably, in no acute distress.  Cardio:Regular rate and rhythm. No murmurs, rubs, or gallops.LUE AV fistula with bruit and palpable thrill.  Pulm:Bilateral expiratory rhonci, improved from prior. Normal work of breathing on room air. Abdomen:Soft, nontender, nondistended. ZOX:[id number] for extremity edema. Skin:Warm and dry. Neuro:Alert and oriented to self, year, month, upcoming events but not place. No focal deficit noted. Able to ambulate with assistance to sink to complete hygiene tasks. Psych:Pleasant mood and affect.   Champ Mungo, DO Internal Medicine PGY-2 04/09/2023,7:43 AM  LOS: 3 days

## 2023-04-09 NOTE — Progress Notes (Signed)
I was informed by HD RN, Bo that pt had infiltrated. Pt coughed and moved his access arm to cover his mouth and infiltrated. Upon assessment there is no area to re cannulate above the filtration. Tx ended after 30 minutes of starting. Dr. Ronalee Belts notified.

## 2023-04-10 DIAGNOSIS — N186 End stage renal disease: Secondary | ICD-10-CM | POA: Diagnosis not present

## 2023-04-10 DIAGNOSIS — Z992 Dependence on renal dialysis: Secondary | ICD-10-CM | POA: Diagnosis not present

## 2023-04-10 LAB — BASIC METABOLIC PANEL
Anion gap: 11 (ref 5–15)
BUN: 42 mg/dL — ABNORMAL HIGH (ref 8–23)
CO2: 25 mmol/L (ref 22–32)
Calcium: 7.9 mg/dL — ABNORMAL LOW (ref 8.9–10.3)
Chloride: 99 mmol/L (ref 98–111)
Creatinine, Ser: 3.73 mg/dL — ABNORMAL HIGH (ref 0.61–1.24)
GFR, Estimated: 15 mL/min — ABNORMAL LOW (ref >60)
Glucose, Bld: 156 mg/dL — ABNORMAL HIGH (ref 70–99)
Potassium: 3.3 mmol/L — ABNORMAL LOW (ref 3.5–5.1)
Sodium: 135 mmol/L (ref 135–145)

## 2023-04-10 LAB — CBC
HCT: 24.9 % — ABNORMAL LOW (ref 39.0–52.0)
Hemoglobin: 8.8 g/dL — ABNORMAL LOW (ref 13.0–17.0)
MCH: 33.3 pg (ref 26.0–34.0)
MCHC: 35.3 g/dL (ref 30.0–36.0)
MCV: 94.3 fL (ref 80.0–100.0)
Platelets: 167 10*3/uL (ref 150–400)
RBC: 2.64 MIL/uL — ABNORMAL LOW (ref 4.22–5.81)
RDW: 14 % (ref 11.5–15.5)
WBC: 5.8 10*3/uL (ref 4.0–10.5)
nRBC: 0 % (ref 0.0–0.2)

## 2023-04-10 LAB — MAGNESIUM: Magnesium: 1.6 mg/dL — ABNORMAL LOW (ref 1.7–2.4)

## 2023-04-10 MED ORDER — METOPROLOL TARTRATE 50 MG PO TABS
50.0000 mg | ORAL_TABLET | Freq: Two times a day (BID) | ORAL | Status: DC
  Administered 2023-04-10: 50 mg via ORAL
  Filled 2023-04-10: qty 1

## 2023-04-10 MED ORDER — DARBEPOETIN ALFA 60 MCG/0.3ML IJ SOSY: 60.0000 ug | PREFILLED_SYRINGE | INTRAMUSCULAR | Status: DC

## 2023-04-10 NOTE — Progress Notes (Signed)
Physical Therapy Treatment Patient Details Name: Pedro Young MRN: [id number] DOB: 1933-09-01 Today's Date: 04/10/2023   History of Present Illness 89 yo male presents to ED on 6/3 with fall, weakness, SOB. PMH includes HTN, CKD, preDM, CKD V with HD cath placed but not yet on HD.    PT Comments    Pt admitted with above diagnosis. Pt progressing with improved safety with RW today increasing distance. Daughter present and states they have all equipment at home.  HHPT recommended as pt wants to get back to using no device if able.  Pt currently with functional limitations due to the deficits listed below (see PT Problem List). Pt will benefit from acute skilled PT to increase their independence and safety with mobility to allow discharge.      Recommendations for follow up therapy are one component of a multi-disciplinary discharge planning process, led by the attending physician.  Recommendations may be updated based on patient status, additional functional criteria and insurance authorization.  Follow Up Recommendations       Assistance Recommended at Discharge Frequent or constant Supervision/Assistance  Patient can return home with the following A little help with walking and/or transfers;A little help with bathing/dressing/bathroom   Equipment Recommendations  Hospital bed;Wheelchair (measurements PT);Wheelchair cushion (measurements PT)    Recommendations for Other Services       Precautions / Restrictions Precautions Precautions: Fall Restrictions Weight Bearing Restrictions: No     Mobility  Bed Mobility Overal bed mobility: Needs Assistance Bed Mobility: Supine to Sit, Sit to Supine     Supine to sit: HOB elevated, Supervision Sit to supine: HOB elevated, Supervision   General bed mobility comments: supervision for safety, increased time and effort and use of bed rail but no physical assist required    Transfers Overall transfer level: Needs  assistance Equipment used: Rolling walker (2 wheels) Transfers: Sit to/from Stand Sit to Stand: Min guard           General transfer comment: min guard for safety, initially steadies himself by bracing his LE on the EoB, does not need assist. Approaching supervision    Ambulation/Gait Ambulation/Gait assistance: Supervision Gait Distance (Feet): 300 Feet Assistive device: Rolling walker (2 wheels) Gait Pattern/deviations: Step-through pattern, Decreased stride length Gait velocity: decr Gait velocity interpretation: <1.31 ft/sec, indicative of household ambulator   General Gait Details: generally steady ambulation in hallway with RW with no LOB with min challenges   Stairs             Wheelchair Mobility    Modified Rankin (Stroke Patients Only)       Balance Overall balance assessment: Needs assistance Sitting-balance support: No upper extremity supported, Feet supported Sitting balance-Leahy Scale: Good Sitting balance - Comments: able to achieve EOB   Standing balance support: Bilateral upper extremity supported, During functional activity Standing balance-Leahy Scale: Poor Standing balance comment: requires UE support                            Cognition Arousal/Alertness: Awake/alert Behavior During Therapy: WFL for tasks assessed/performed Overall Cognitive Status: Impaired/Different from baseline Area of Impairment: Orientation, Following commands, Safety/judgement, Problem solving, Memory, Awareness                 Orientation Level: Disoriented to, Place, Situation, Time ("I think this looks like a hospital after daughter oriented him right after arrival." Year: "24 or 23". Month: "December")   Memory: Decreased short-term memory Following Commands:  Follows one step commands with increased time Safety/Judgement: Decreased awareness of deficits Awareness: Intellectual Problem Solving: Difficulty sequencing, Requires verbal cues,  Requires tactile cues General Comments: Min cues for orientation to year and max for month. Pt following one step commands with incrsaed time. Pleasant throughout. Cues for safe use and placement of RW        Exercises      General Comments        Pertinent Vitals/Pain Pain Assessment Faces Pain Scale: No hurt    Home Living                          Prior Function            PT Goals (current goals can now be found in the care plan section) Progress towards PT goals: Progressing toward goals    Frequency    Min 4X/week      PT Plan Current plan remains appropriate    Co-evaluation              AM-PAC PT "6 Clicks" Mobility   Outcome Measure  Help needed turning from your back to your side while in a flat bed without using bedrails?: A Little Help needed moving from lying on your back to sitting on the side of a flat bed without using bedrails?: A Little Help needed moving to and from a bed to a chair (including a wheelchair)?: A Little Help needed standing up from a chair using your arms (e.g., wheelchair or bedside chair)?: A Little Help needed to walk in hospital room?: A Little Help needed climbing 3-5 steps with a railing? : A Lot 6 Click Score: 17    End of Session Equipment Utilized During Treatment: Gait belt Activity Tolerance: Patient tolerated treatment well Patient left: with call bell/phone within reach;with family/visitor present;in chair;with chair alarm set Nurse Communication: Mobility status PT Visit Diagnosis: Other abnormalities of gait and mobility (R26.89)     Time: 4098-1191 PT Time Calculation (min) (ACUTE ONLY): 18 min  Charges:  $Gait Training: 8-22 mins                     Creedon Danielski M,PT Acute Rehab Services [phone]    Bevelyn Buckles 04/10/2023, 1:48 PM

## 2023-04-10 NOTE — Plan of Care (Signed)

## 2023-04-10 NOTE — TOC Progression Note (Signed)
Transition of Care [facility]) - Progression Note    Patient Details  Name: Pedro Young MRN: [id number] Date of Birth: Dec 22, 1932  Transition of Care [facility]) CM/SW Contact  Janae Bridgeman, RN Phone Number: 04/10/2023, 11:00 AM  Clinical Narrative:    CM met with the patient's daughter at the bedside and patient's hospital bed and wheelchair are being delivered to the home today.  Olivia Canter, CM Renal Navigator is at the bedside to discuss patient's appointment for HD Outpatient clinic.  The patient's daughter was provided with copy of [place] application that was completed and faxed to the [facility] email and message was left with Lorretta Harp, CM with [place] to coordinate.   Expected Discharge Plan: Home w Home Health Services Barriers to Discharge: Continued Medical Work up  Expected Discharge Plan and Services   Discharge Planning Services: CM Consult Post Acute Care Choice: Home Health, Durable Medical Equipment Living arrangements for the past 2 months: Single Family Home                 DME Arranged: Hospital bed, Wheelchair manual DME Agency: [employer] Date DME Agency Contacted: 04/08/23 Time DME Agency Contacted: 1047 Representative spoke with at DME Agency: Milas Gain, CM with [employer] HH Arranged: PT, OT, Nurse's Aide HH Agency: [facility] Date [facility] Agency Contacted: 04/08/23 Time HH Agency Contacted: 1048 Representative spoke with at [facility] Agency: Kandee Keen, CM with [facility] accepted for PT/OT/aide   Social Determinants of Health (SDOH) Interventions SDOH Screenings   Food Insecurity: No Food Insecurity (04/06/2023)  Housing: Patient Declined (04/06/2023)  Transportation Needs: No Transportation Needs (04/06/2023)  Utilities: Not At Risk (04/06/2023)  Tobacco Use: Medium Risk (04/06/2023)    Readmission Risk Interventions    04/08/2023   10:49 AM  Readmission Risk Prevention Plan  Transportation Screening Complete  PCP or Specialist Appt within 5-7 Days Complete  Home  Care Screening Complete  Medication Review (RN CM) Complete

## 2023-04-10 NOTE — Progress Notes (Signed)
Assessment/ Plan: Pedro Young is a 89 y.o. 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   ESRD: Patient successfully tolerated HD; yesterday. He's had 3 treatments so far, was uncomfortable during the 3rd treatment. Suspect this is from an infiltration.   Appreciate renal navigator finding him a spot a SW TTS next HD there tomorrow at 1145AM but needs to be there at 11AM tomorrow to fill out paperwork.  OK to d/c/from renal standpoint.  Vascular access: Successful access of existing Lt [facility]im[place] but has e/o infiltration; access is tortuous and skin very thin. Would not be surprised if he requires a TC sometime next week. Anemia: In setting of CKD stage 5/ESRD. Hgb today 8.4, stable. Plan to continue daily iron infusion, weekly aranesp. Trend CBC, transfuse for HGB <7. Metabolic bone disease of CKD: iPTH and calcium pending. Phosphorous now normalized with HD. Electrolyte derangements: Mild hyponatremia, improved to 133. Mild hypokalemia at 3.2. Will address hypokalemia with HD today. Trend BMP. HTN/volume: Mild hypertension. No concern for hypervolemia. On imdur 90 mg daily, metoprolol 12.5 mg BID.  Per primary. Disposition: Renal Navigator assisting in OP HD placement. Anticipate he will be ready for discharge once OP HD needs have been confirmed.  Subjective:  Patient notes that he feels improved but had some pain with HD on Thur. Updated the pt and his daughter.  Objective Vital signs in last 24 hours: Vitals:   04/09/23 1929 04/10/23 0458 04/10/23 0737 04/10/23 1015  BP: (!) 152/73 (!) 141/68 139/76 (!) 157/142  Pulse: 94 92 96 93  Resp: 18 18 17    Temp: (!) 97.4 F (36.3 C) 98.6 F (37 C) 98.3 F (36.8 C)   TempSrc: Oral  Oral   SpO2: 97% 95% 96%   Weight:       Weight change: 1.3 kg  Intake/Output Summary (Last 24 hours) at 04/10/2023 1150 Last data filed at 04/09/2023 1420 Gross per 24 hour  Intake --  Output 0 ml  Net  0 ml     Labs: Basic Metabolic Panel: Recent Labs  Lab 04/08/23 1238 04/09/23 0730 04/09/23 1233 04/10/23 1000  NA 130* 133* 131* 135  K 3.9 3.2* 3.3* 3.3*  CL 98 97* 97* 99  CO2 17* 26 25 25   GLUCOSE 103* 84 117* 156*  BUN 92* 45* 46* 42*  CREATININE 5.79* 3.45* 3.64* 3.73*  CALCIUM 7.8* 7.7* 7.9* 7.9*  PHOS 5.0* 3.0 2.8  --    Liver Function Tests: Recent Labs  Lab 04/08/23 1238 04/09/23 1233  ALBUMIN 2.5* 2.4*   No results for input(s): "LIPASE", "AMYLASE" in the last 168 hours. No results for input(s): "AMMONIA" in the last 168 hours.  CBC: Recent Labs  Lab 04/07/23 0454 04/08/23 0748 04/08/23 1238 04/09/23 0730 04/09/23 1233  WBC 4.7 7.2 6.5 5.5 5.3  HGB 8.7* 8.3* 8.6* 8.4* 8.9*  HCT 26.0* 24.0* 25.7* 24.2* 26.3*  MCV 90.9 90.6 92.1 90.6 91.6  PLT 169 179 194 172 179   Cardiac Enzymes: No results for input(s): "CKTOTAL", "CKMB", "CKMBINDEX", "TROPONINI" in the last 168 hours.  CBG: Recent Labs  Lab 04/07/23 0729 04/07/23 1206  GLUCAP 63* 90   Iron Studies:  No results for input(s): "IRON", "TIBC", "TRANSFERRIN", "FERRITIN" in the last 72 hours.  Studies/Results: No results found.  Medications: Infusions:  ferric gluconate (FERRLECIT) IVPB Stopped (04/08/23 1709)   Scheduled Medications:  Chlorhexidine Gluconate Cloth  6 each Topical Q0600  Darbepoetin Alfa  60 mcg Subcutaneous Q7 days   feeding supplement (NEPRO CARB STEADY)  237 mL Oral BID BM   guaiFENesin  600 mg Oral BID   heparin  5,000 Units Subcutaneous Q8H   isosorbide mononitrate  90 mg Oral Daily   metoprolol tartrate  50 mg Oral BID   multivitamin  1 tablet Oral QHS   rosuvastatin  10 mg Oral Daily   sodium chloride flush  3 mL Intravenous Q12H   tamsulosin  0.4 mg Oral Daily   I have reviewed scheduled and prn medications.  Physical Exam: Constitutional:Chronically-ill appearing elderly gentleman resting comfortably Cardio:Regular rate and rhythm Pulm:Bilateral  expiratory rhonchi, normal work of breathing on room air. Abdomen:Soft, nontender, nondistended. ZOX:[id number] for extremity edema. Skin:Warm and dry. Neuro:Alert and oriented to self, year, month, upcoming events but not place. No focal deficit noted. Able to ambulate with assistance to sink to complete hygiene tasks.

## 2023-04-10 NOTE — Plan of Care (Signed)
  Problem: Education: Goal: Knowledge of disease and its progression will improve Outcome: Adequate for Discharge   Problem: Fluid Volume: Goal: Compliance with measures to maintain balanced fluid volume will improve Outcome: Adequate for Discharge   Problem: Health Behavior/Discharge Planning: Goal: Ability to manage health-related needs will improve Outcome: Adequate for Discharge   Problem: Nutritional: Goal: Ability to make healthy dietary choices will improve Outcome: Adequate for Discharge   Problem: Clinical Measurements: Goal: Complications related to the disease process, condition or treatment will be avoided or minimized Outcome: Adequate for Discharge   

## 2023-04-10 NOTE — Progress Notes (Signed)
New Dialysis Start    Patient identified as new dialysis start. Kidney Education packet assembled and given. Discussed the following items with patient:     Current medications and possible changes once started:  Discussed that patient's medications may change over time.  Ex; hypertension medications and diabetes medication.  Nephrologists will adjust as needed.   Fluid restrictions reviewed:  32 oz daily goal:  All liquids count; soups, ice, jello, fruits.   Phosphorus and potassium: Handout given showing high potassium and phosphorus foods.  Alternative food and drink options given.    Family support:  Daughter at bedside   Outpatient Clinic Resources:  Discussed roles of Outpatient clinic staff and advised to make a list of needs, if any, to talk with outpatient staff if needed.   Care plan schedule: Informed patient of Care Plans in outpatient setting and to participate in the care plan.  An invitation would be given from outpatient clinic.    Dialysis Access Options:  Reviewed access options with patients. Discussed in detail about care at home with new AVG & AVF. Reviewed checking bruit and thrill. If dialysis catheter present, educated that patient could not take showers.  Catheter dressing changes were to be done by outpatient clinic staff only   Home therapy options:  Educated patient about home therapy options:  PD vs home hemo.     Patient verbalized understanding. Will continue to round on patient during admission.    Jean Rosenthal Dialysis Nurse Coordinator [phone]

## 2023-04-10 NOTE — Discharge Summary (Signed)
Physician Discharge Summary  Pedro Young UUV:[id number] DOB: 1933-05-29 DOA: 04/06/2023  PCP: Maudie Flakes, FNP  Admit date: 04/06/2023 Discharge date: 04/10/2023  Admitted From: Home Disposition:  Home  Recommendations for Outpatient Follow-up:  Follow up with PCP in 1-2 weeks Please obtain BMP/CBC in one week your next doctors visit.  Outpatient HD arranged by nephrology team.   Discharge Condition: Stable CODE STATUS: Full code Diet recommendation: Renal  Brief/Interim Summary:  89 y.o. male with PMH significant fo HTN, HLD, CAD, and CKD V not yet on dialysis with an AV fistula ready on left upper extremity. Patient has progressively worsening oral intake, generalized weakness and has had frequent falls, fell about 3 times in the last 2 weeks.  Recently also developed nonproductive cough, shortness of breath, wheezing - chest x-ray patient was clear.  Family also reported involuntary jerking, unsteadiness upon standing and generalized weakness.  On 6/3 patient was found on the floor without loss of consciousness and brought to the hospital for further evaluation.  Upon admission noted to have AKI on CKD stage V and metabolic acidosis.  Nephrology consulted with hemodialysis.  TOC working on outpatient hemodialysis.  Eventually outpatient HD was arranged.  Medically stable for discharge today.     Assessment & Plan:  Principal Problem:   Acute renal failure superimposed on stage 5 chronic kidney disease, not on chronic dialysis (HCC) Active Problems:   Anemia of chronic renal failure, stage 4 (severe) (HCC)   HYPERTENSION, BENIGN ESSENTIAL   Acute metabolic encephalopathy   Hyponatremia   CAD (coronary artery disease)   Metabolic acidosis   Protein-calorie malnutrition, severe     AKI on CKD V > ESRD Acute on chronic metabolic acidosis Uremia Worsening renal function.  Creatinine now up to 7.32, BUN 122 with metabolic acidosis.  Tolerating HD via left upper extremity AV  fistula, TOC working on outpatient HD spot     Hyponatremia  Stable around 130   Hypotension H/o essential hypertension Resume home medications   CAD/HLD Continue home statin   Anemia of chronic disease Hemoglobin around baseline of 9.   Aranesp   BPH Flomax   Generalized weakness PT eval-Home health.  Face-to-face completed      Discharge Diagnoses:  Principal Problem:   Acute renal failure superimposed on stage 5 chronic kidney disease, not on chronic dialysis (HCC) Active Problems:   Anemia of chronic renal failure, stage 4 (severe) (HCC)   HYPERTENSION, BENIGN ESSENTIAL   Acute metabolic encephalopathy   Hyponatremia   CAD (coronary artery disease)   Metabolic acidosis   Protein-calorie malnutrition, severe      Consultations: Nephrology  Subjective: Feeling well no complaints.  Discharge Exam: Vitals:   04/10/23 0737 04/10/23 1015  BP: 139/76 (!) 157/142  Pulse: 96 93  Resp: 17   Temp: 98.3 F (36.8 C)   SpO2: 96%    Vitals:   04/09/23 1929 04/10/23 0458 04/10/23 0737 04/10/23 1015  BP: (!) 152/73 (!) 141/68 139/76 (!) 157/142  Pulse: 94 92 96 93  Resp: 18 18 17    Temp: (!) 97.4 F (36.3 C) 98.6 F (37 C) 98.3 F (36.8 C)   TempSrc: Oral  Oral   SpO2: 97% 95% 96%   Weight:        General: Pt is alert, awake, not in acute distress Cardiovascular: RRR, S1/S2 +, no rubs, no gallops Respiratory: CTA bilaterally, no wheezing, no rhonchi Abdominal: Soft, NT, ND, bowel sounds + Extremities: no edema, no cyanosis  Discharge Instructions   Allergies as of 04/10/2023       Reactions   Amoxicillin-pot Clavulanate Other (See Comments)   Did it involve swelling of the face/tongue/throat, SOB, or low BP? Unknown Did it involve sudden or severe rash/hives, skin peeling, or any reaction on the inside of your mouth or nose? Unknown Did you need to seek medical attention at a hospital or doctor's office? Unknown When did it last happen? patient  is unfamiliar with this allergy   If all above answers are "NO", may proceed with cephalosporin use.   Atorvastatin    REACTION: arthralgia   Lovastatin    REACTION: arthralgia        Medication List     STOP taking these medications    Lokelma 10 g Pack packet Generic drug: sodium zirconium cyclosilicate       TAKE these medications    amLODipine 10 MG tablet Commonly known as: NORVASC Take 10 mg by mouth daily.   benzonatate 100 MG capsule Commonly known as: TESSALON Take 100 mg by mouth 3 (three) times daily as needed for cough.   colchicine 0.6 MG tablet Take 0.6 mg by mouth 2 (two) times daily.   Darbepoetin Alfa 60 MCG/0.3ML Sosy injection Commonly known as: ARANESP Inject 0.3 mLs (60 mcg total) into the skin every 7 (seven) days. Start taking on: April 14, 2023   diclofenac Sodium 1 % Gel Commonly known as: VOLTAREN Apply 2 g topically 4 (four) times daily as needed (joint pain).   furosemide 40 MG tablet Commonly known as: LASIX Take 40 mg by mouth daily. Prescribed by Dr Eliott [facility]) since May 2016   isosorbide mononitrate 60 MG 24 hr tablet Commonly known as: IMDUR Take 1.5 tablets (90 mg total) by mouth daily.   metoprolol tartrate 50 MG tablet Commonly known as: LOPRESSOR Take 1 tablet (50 mg total) by mouth 2 (two) times daily.   nitroGLYCERIN 0.4 MG SL tablet Commonly known as: NITROSTAT PLACE 1 TABLET UNDER THE TONGUE EVERY 5 MINUTES AS NEEDED FOR CHEST PAIN What changed: See the new instructions.   ranolazine 500 MG 12 hr tablet Commonly known as: RANEXA Take 500 mg by mouth 2 (two) times daily.   rosuvastatin 40 MG tablet Commonly known as: CRESTOR TAKE 1 TABLET BY MOUTH EVERY DAY   tamsulosin 0.4 MG Caps capsule Commonly known as: FLOMAX Take 0.4 mg by mouth daily.               Durable Medical Equipment  (From admission, onward)           Start     Ordered   04/08/23 1041  For home use only DME  lightweight manual wheelchair with seat cushion  Once       Comments: Patient suffers from AKI on CKD 5, Acute metabolic acidosis which impairs their ability to perform daily activities like dressing and toileting in the home.  A walker will not resolve  issue with performing activities of daily living. A wheelchair will allow patient to safely perform daily activities. Patient is not able to propel themselves in the home using a standard weight wheelchair due to general weakness. Patient can self propel in the lightweight wheelchair.  Needs 18 in wheelchair, Length of need 12 months . Accessories: elevating leg rests (ELRs), wheel locks, extensions and anti-tippers.   04/08/23 1040   04/08/23 0839  For home use only DME Hospital bed  Once  Question Answer Comment  Length of Need 12 Months   Patient has (list medical condition): AKI on CKDV, generalized weakness, Acute on chronic metabolic acidosis, Hyponatremia   The above medical condition requires: Patient requires the ability to reposition frequently   Head must be elevated greater than: 30 degrees   Bed type Semi-electric   Support Surface: Gel Overlay      04/08/23 0841            Follow-up Information     [facility] Follow up.   Specialty: Home Health Services Why: [facility] health will provide home health services for PT/OT and aide.  Frances Furbish will call you in the next 24-48 hours to set up services. Contact information: [address] [place] [address] [phone]         [facility] Follow up.   Why: Call the reservation line to schedule patient's dialysis appointment and MD appointment needs for transportation. Contact information: [phone]        [facility] [place] Go on 04/11/2023.   Why: Schedule is Tuesday/Thursday/Saturday with 11:45 am chair time.  On Saturday, please arrive at 11:00 am to complete paperwork prior to treatment. Contact  information: [address] [place] [address] [phone]                Allergies  Allergen Reactions   Amoxicillin-Pot Clavulanate Other (See Comments)    Did it involve swelling of the face/tongue/throat, SOB, or low BP? Unknown Did it involve sudden or severe rash/hives, skin peeling, or any reaction on the inside of your mouth or nose? Unknown Did you need to seek medical attention at a hospital or doctor's office? Unknown When did it last happen? patient is unfamiliar with this allergy   If all above answers are "NO", may proceed with cephalosporin use.    Atorvastatin     REACTION: arthralgia   Lovastatin     REACTION: arthralgia    You were cared for by a hospitalist during your hospital stay. If you have any questions about your discharge medications or the care you received while you were in the hospital after you are discharged, you can call the unit and asked to speak with the hospitalist on call if the hospitalist that took care of you is not available. Once you are discharged, your primary care physician will handle any further medical issues. Please note that no refills for any discharge medications will be authorized once you are discharged, as it is imperative that you return to your primary care physician (or establish a relationship with a primary care physician if you do not have one) for your aftercare needs so that they can reassess your need for medications and monitor your lab values.  You were cared for by a hospitalist during your hospital stay. If you have any questions about your discharge medications or the care you received while you were in the hospital after you are discharged, you can call the unit and asked to speak with the hospitalist on call if the hospitalist that took care of you is not available. Once you are discharged, your primary care physician will handle any further medical issues. Please note that NO REFILLS for any discharge medications  will be authorized once you are discharged, as it is imperative that you return to your primary care physician (or establish a relationship with a primary care physician if you do not have one) for your aftercare needs so  that they can reassess your need for medications and monitor your lab values.  Please request your Prim.MD to go over all Hospital Tests and Procedure/Radiological results at the follow up, please get all Hospital records sent to your Prim MD by signing hospital release before you go home.  Get CBC, CMP, 2 view Chest X ray checked  by Primary MD during your next visit or SNF MD in 5-7 days ( we routinely change or add medications that can affect your baseline labs and fluid status, therefore we recommend that you get the mentioned basic workup next visit with your PCP, your PCP may decide not to get them or add new tests based on their clinical decision)  On your next visit with your primary care physician please Get Medicines reviewed and adjusted.  If you experience worsening of your admission symptoms, develop shortness of breath, life threatening emergency, suicidal or homicidal thoughts you must seek medical attention immediately by calling 911 or calling your MD immediately  if symptoms less severe.  You Must read complete instructions/literature along with all the possible adverse reactions/side effects for all the Medicines you take and that have been prescribed to you. Take any new Medicines after you have completely understood and accpet all the possible adverse reactions/side effects.   Do not drive, operate heavy machinery, perform activities at heights, swimming or participation in water activities or provide baby sitting services if your were admitted for syncope or siezures until you have seen by Primary MD or a Neurologist and advised to do so again.  Do not drive when taking Pain medications.   Procedures/Studies: DG Swallowing Func-Speech Pathology  Result  Date: 04/08/2023 Table formatting from the original result was not included. Modified Barium Swallow Study Patient Details Name: Pedro Young MRN: [id number] Date of Birth: 1933-09-06 Today's Date: 04/08/2023 HPI/PMH: HPI: 89 yo male presents to ED on 6/3 with fall, weakness, SOB. PMH includes HTN, CKD, preDM, CKD V with HD cath placed but not yet on HD. CT No active cardiopulmonary disease. Clinical Impression: Clinical Impression: Pt exhibited functional oral control, cohesion, mastication and transit across textures. Minimal pharyngeal phase dysphagia without penetration or aspiration. There was mild valleculae and pyriform sinus residue with thin, nectar and solid that he sensed and swallowed spontaneously to clear. He appears to have cervical  and possibly thoracic osteophytes with minimal retention without retrograde movement. There was no coughing during MBS however clinically he does cough when eating which may be from possible esophgeal retention during meals. The barium pill transited esophagus without difficulty. Recommend he continue regular texture, thin liquids, straw allowed, pills with thin, stay upright after meals and alternate liquids and solids. No further ST needed. Factors that may increase risk of adverse event in presence of aspiration [name]: No data recorded Recommendations/Plan: Swallowing Evaluation Recommendations Swallowing Evaluation Recommendations Recommendations: PO diet PO Diet Recommendation: Regular; Thin liquids (Level 0) Liquid Administration via: Straw; Cup Medication Administration: Whole meds with liquid Supervision: Patient able to self-feed Swallowing strategies  : Slow rate; Small bites/sips Postural changes: Position pt fully upright for meals Oral care recommendations: Oral care BID (2x/day) Treatment Plan Treatment Plan Treatment recommendations: No treatment recommended at this time Follow-up recommendations: No SLP follow up Functional status  assessment: Patient has not had a recent decline in their functional status. Recommendations Recommendations for follow up therapy are one component of a multi-disciplinary discharge planning process, led by the attending physician.  Recommendations may be updated based on patient  status, additional functional criteria and insurance authorization. Assessment: Orofacial Exam: Orofacial Exam Oral Cavity: Oral Hygiene: WFL Oral Cavity - Dentition: Adequate natural dentition Orofacial Anatomy: WFL Oral Motor/Sensory Function: WFL Anatomy: Anatomy: Suspected cervical osteophytes Boluses Administered: Boluses Administered Boluses Administered: Thin liquids (Level 0); Mildly thick liquids (Level 2, nectar thick); Moderately thick liquids (Level 3, honey thick); Puree; Solid  Oral Impairment Domain: Oral Impairment Domain Lip Closure: No labial escape Tongue control during bolus hold: Cohesive bolus between tongue to palatal seal Bolus preparation/mastication: Timely and efficient chewing and mashing Bolus transport/lingual motion: Brisk tongue motion Oral residue: Complete oral clearance Location of oral residue : N/A Initiation of pharyngeal swallow : Valleculae  Pharyngeal Impairment Domain: Pharyngeal Impairment Domain Soft palate elevation: No bolus between soft palate (SP)/pharyngeal wall (PW) Laryngeal elevation: Complete superior movement of thyroid cartilage with complete approximation of arytenoids to epiglottic petiole Anterior hyoid excursion: Complete anterior movement Epiglottic movement: Complete inversion Laryngeal vestibule closure: Complete, no air/contrast in laryngeal vestibule Pharyngeal stripping wave : Present - complete Pharyngeal contraction (A/P view only): N/A Pharyngoesophageal segment opening: Partial distention/partial duration, partial obstruction of flow Tongue base retraction: No contrast between tongue base and posterior pharyngeal wall (PPW) Pharyngeal residue: Collection of residue within  or on pharyngeal structures Location of pharyngeal residue: Valleculae; Pyriform sinuses; Tongue base  Esophageal Impairment Domain: Esophageal Impairment Domain Esophageal clearance upright position: Esophageal retention (trace) Pill: Esophageal Impairment Domain Esophageal clearance upright position: Esophageal retention (trace) Penetration/Aspiration Scale Score: Penetration/Aspiration Scale Score 1.  Material does not enter airway: Thin liquids (Level 0); Mildly thick liquids (Level 2, nectar thick); Moderately thick liquids (Level 3, honey thick); Puree; Solid; Pill Compensatory Strategies: Compensatory Strategies Compensatory strategies: No   General Information: Caregiver present: No  Diet Prior to this Study: Regular; Thin liquids (Level 0)   Temperature : Normal   Respiratory Status: WFL   Supplemental O2: None (Room air)   History of Recent Intubation: No  Behavior/Cognition: Alert; Cooperative; Pleasant mood Self-Feeding Abilities: Able to self-feed Baseline vocal quality/speech: Normal Volitional Cough: Able to elicit Volitional Swallow: Able to elicit No data recorded Goal Planning: Prognosis for improved oropharyngeal function: Good No data recorded No data recorded No data recorded Consulted and agree with results and recommendations: Patient; Family member/caregiver Pain: Pain Assessment Pain Assessment: No/denies pain Faces Pain Scale: 0 Pain Intervention(s): Monitored during session End of Session: Start Time:SLP Start Time (ACUTE ONLY): 1027 Stop Time: SLP Stop Time (ACUTE ONLY): 1038 Time Calculation:SLP Time Calculation (min) (ACUTE ONLY): 11 min Charges: SLP Evaluations $ SLP Speech Visit: 1 Visit SLP Evaluations $BSS Swallow: 1 Procedure $MBS Swallow: 1 Procedure SLP visit diagnosis: SLP Visit Diagnosis: Dysphagia, pharyngoesophageal phase (R13.14) Past Medical History: Past Medical History: Diagnosis Date  Cancer (HCC)   Hypertension   Renal disorder   CRF Past Surgical History: Past Surgical  History: Procedure Laterality Date  AV FISTULA PLACEMENT Left 05/04/2019  Procedure: Arteriovenous (Av) Fistula Creation Left Arm;  Surgeon: Larina Earthly, MD;  Location: [facility] OR;  Service: Vascular;  Laterality: Left;  BIOPSY  08/10/2019  Procedure: BIOPSY;  Surgeon: Lynann Bologna, MD;  Location: [facility] ENDOSCOPY;  Service: Endoscopy;;  CHOLECYSTECTOMY    COLONOSCOPY N/A 08/10/2019  Procedure: COLONOSCOPY;  Surgeon: Lynann Bologna, MD;  Location: [facility] ENDOSCOPY;  Service: Endoscopy;  Laterality: N/A;  EYE SURGERY Bilateral   cataract  LEFT HEART CATH AND CORONARY ANGIOGRAPHY N/A 06/19/2020  Procedure: LEFT HEART CATH AND CORONARY ANGIOGRAPHY;  Surgeon: Lyn Records, MD;  Location: [facility]  INVASIVE CV LAB;  Service: Cardiovascular;  Laterality: N/A;  POLYPECTOMY  08/10/2019  Procedure: POLYPECTOMY;  Surgeon: Lynann Bologna, MD;  Location: [facility];  Service: Endoscopy;; [name] 04/08/2023, 11:21 AM  DG Chest 2 View  Result Date: 04/06/2023 CLINICAL DATA:  Shortness of breath EXAM: CHEST - 2 VIEW COMPARISON:  None Available. FINDINGS: The heart size and mediastinal contours are within normal limits. Both lungs are clear. The visualized skeletal structures are unremarkable. Prominent appearance of the costochondral margins again noted IMPRESSION: No active cardiopulmonary disease. Electronically Signed   By: Deatra Robinson M.D.   On: 04/06/2023 16:28     The results of significant diagnostics from this hospitalization (including imaging, microbiology, ancillary and laboratory) are listed below for reference.     Microbiology: Recent Results (from the past 240 hour(s))  Resp panel by RT-PCR (RSV, Flu A&B, Covid) Anterior Nasal Swab     Status: None   Collection Time: 04/06/23  3:55 PM   Specimen: Anterior Nasal Swab  Result Value Ref Range Status   SARS Coronavirus 2 by RT PCR NEGATIVE NEGATIVE Final    Comment: (NOTE) SARS-CoV-2 target nucleic acids are NOT DETECTED.  The SARS-CoV-2 RNA is generally  detectable in upper respiratory specimens during the acute phase of infection. The lowest concentration of SARS-CoV-2 viral copies this assay can detect is 138 copies/mL. A negative result does not preclude SARS-Cov-2 infection and should not be used as the sole basis for treatment or other patient management decisions. A negative result may occur with  improper specimen collection/handling, submission of specimen other than nasopharyngeal swab, presence of viral mutation(s) within the areas targeted by this assay, and inadequate number of viral copies(<138 copies/mL). A negative result must be combined with clinical observations, patient history, and epidemiological information. The expected result is Negative.  Fact Sheet for Patients:  BloggerCourse.com  Fact Sheet for Healthcare Providers:  SeriousBroker.it  This test is no t yet approved or cleared by the [place] FDA and  has been authorized for detection and/or diagnosis of SARS-CoV-2 by FDA under an Emergency Use Authorization (EUA). This EUA will remain  in effect (meaning this test can be used) for the duration of the COVID-19 declaration under Section 564(b)(1) of the Act, 21 U.S.C.section 360bbb-3(b)(1), unless the authorization is terminated  or revoked sooner.       Influenza A by PCR NEGATIVE NEGATIVE Final   Influenza B by PCR NEGATIVE NEGATIVE Final    Comment: (NOTE) The Xpert Xpress SARS-CoV-2/FLU/RSV plus assay is intended as an aid in the diagnosis of influenza from Nasopharyngeal swab specimens and should not be used as a sole basis for treatment. Nasal washings and aspirates are unacceptable for Xpert Xpress SARS-CoV-2/FLU/RSV testing.  Fact Sheet for Patients: BloggerCourse.com  Fact Sheet for Healthcare Providers: SeriousBroker.it  This test is not yet approved or cleared by the [place] FDA  and has been authorized for detection and/or diagnosis of SARS-CoV-2 by FDA under an Emergency Use Authorization (EUA). This EUA will remain in effect (meaning this test can be used) for the duration of the COVID-19 declaration under Section 564(b)(1) of the Act, 21 U.S.C. section 360bbb-3(b)(1), unless the authorization is terminated or revoked.     Resp Syncytial Virus by PCR NEGATIVE NEGATIVE Final    Comment: (NOTE) Fact Sheet for Patients: BloggerCourse.com  Fact Sheet for Healthcare Providers: SeriousBroker.it  This test is not yet approved or cleared by the [place] FDA and has been authorized for detection and/or  diagnosis of SARS-CoV-2 by FDA under an Emergency Use Authorization (EUA). This EUA will remain in effect (meaning this test can be used) for the duration of the COVID-19 declaration under Section 564(b)(1) of the Act, 21 U.S.C. section 360bbb-3(b)(1), unless the authorization is terminated or revoked.  Performed at [facility], [address] [place], [place] [address]      Labs: BNP (last 3 results) Recent Labs    04/06/23 1557  BNP 664.6*   Basic Metabolic Panel: Recent Labs  Lab 04/07/23 0454 04/08/23 0748 04/08/23 1238 04/09/23 0730 04/09/23 1233 04/10/23 1000  NA 128* 129* 130* 133* 131* 135  K 4.8 4.1 3.9 3.2* 3.3* 3.3*  CL 97* 96* 98 97* 97* 99  CO2 15* 18* 17* 26 25 25   GLUCOSE 73 96 103* 84 117* 156*  BUN 122* 94* 92* 45* 46* 42*  CREATININE 7.32* 5.95* 5.79* 3.45* 3.64* 3.73*  CALCIUM 7.6* 7.7* 7.8* 7.7* 7.9* 7.9*  MG  --   --   --  1.6*  --  1.6*  PHOS 7.1*  --  5.0* 3.0 2.8  --    Liver Function Tests: Recent Labs  Lab 04/08/23 1238 04/09/23 1233  ALBUMIN 2.5* 2.4*   No results for input(s): "LIPASE", "AMYLASE" in the last 168 hours. No results for input(s): "AMMONIA" in the last 168 hours. CBC: Recent Labs  Lab 04/07/23 0454 04/08/23 0748  04/08/23 1238 04/09/23 0730 04/09/23 1233  WBC 4.7 7.2 6.5 5.5 5.3  HGB 8.7* 8.3* 8.6* 8.4* 8.9*  HCT 26.0* 24.0* 25.7* 24.2* 26.3*  MCV 90.9 90.6 92.1 90.6 91.6  PLT 169 179 194 172 179   Cardiac Enzymes: No results for input(s): "CKTOTAL", "CKMB", "CKMBINDEX", "TROPONINI" in the last 168 hours. BNP: Invalid input(s): "POCBNP" CBG: Recent Labs  Lab 04/07/23 0729 04/07/23 1206  GLUCAP 63* 90   D-Dimer No results for input(s): "DDIMER" in the last 72 hours. Hgb A1c No results for input(s): "HGBA1C" in the last 72 hours. Lipid Profile No results for input(s): "CHOL", "HDL", "LDLCALC", "TRIG", "CHOLHDL", "LDLDIRECT" in the last 72 hours. Thyroid function studies No results for input(s): "TSH", "T4TOTAL", "T3FREE", "THYROIDAB" in the last 72 hours.  Invalid input(s): "FREET3" Anemia work up No results for input(s): "VITAMINB12", "FOLATE", "FERRITIN", "TIBC", "IRON", "RETICCTPCT" in the last 72 hours. Urinalysis    Component Value Date/Time   COLORURINE YELLOW 09/27/2021 1730   APPEARANCEUR CLOUDY (A) 09/27/2021 1730   LABSPEC 1.012 09/27/2021 1730   PHURINE 5.0 09/27/2021 1730   GLUCOSEU NEGATIVE 09/27/2021 1730   HGBUR MODERATE (A) 09/27/2021 1730   BILIRUBINUR NEGATIVE 09/27/2021 1730   KETONESUR NEGATIVE 09/27/2021 1730   PROTEINUR 100 (A) 09/27/2021 1730   UROBILINOGEN 4.0 (H) 08/04/2015 0232   NITRITE NEGATIVE 09/27/2021 1730   LEUKOCYTESUR LARGE (A) 09/27/2021 1730   Sepsis Labs Recent Labs  Lab 04/08/23 0748 04/08/23 1238 04/09/23 0730 04/09/23 1233  WBC 7.2 6.5 5.5 5.3   Microbiology Recent Results (from the past 240 hour(s))  Resp panel by RT-PCR (RSV, Flu A&B, Covid) Anterior Nasal Swab     Status: None   Collection Time: 04/06/23  3:55 PM   Specimen: Anterior Nasal Swab  Result Value Ref Range Status   SARS Coronavirus 2 by RT PCR NEGATIVE NEGATIVE Final    Comment: (NOTE) SARS-CoV-2 target nucleic acids are NOT DETECTED.  The SARS-CoV-2 RNA  is generally detectable in upper respiratory specimens during the acute phase of infection. The lowest concentration of SARS-CoV-2 viral copies  this assay can detect is 138 copies/mL. A negative result does not preclude SARS-Cov-2 infection and should not be used as the sole basis for treatment or other patient management decisions. A negative result may occur with  improper specimen collection/handling, submission of specimen other than nasopharyngeal swab, presence of viral mutation(s) within the areas targeted by this assay, and inadequate number of viral copies(<138 copies/mL). A negative result must be combined with clinical observations, patient history, and epidemiological information. The expected result is Negative.  Fact Sheet for Patients:  BloggerCourse.com  Fact Sheet for Healthcare Providers:  SeriousBroker.it  This test is no t yet approved or cleared by the [place] FDA and  has been authorized for detection and/or diagnosis of SARS-CoV-2 by FDA under an Emergency Use Authorization (EUA). This EUA will remain  in effect (meaning this test can be used) for the duration of the COVID-19 declaration under Section 564(b)(1) of the Act, 21 U.S.C.section 360bbb-3(b)(1), unless the authorization is terminated  or revoked sooner.       Influenza A by PCR NEGATIVE NEGATIVE Final   Influenza B by PCR NEGATIVE NEGATIVE Final    Comment: (NOTE) The Xpert Xpress SARS-CoV-2/FLU/RSV plus assay is intended as an aid in the diagnosis of influenza from Nasopharyngeal swab specimens and should not be used as a sole basis for treatment. Nasal washings and aspirates are unacceptable for Xpert Xpress SARS-CoV-2/FLU/RSV testing.  Fact Sheet for Patients: BloggerCourse.com  Fact Sheet for Healthcare Providers: SeriousBroker.it  This test is not yet approved or cleared by the  [place] FDA and has been authorized for detection and/or diagnosis of SARS-CoV-2 by FDA under an Emergency Use Authorization (EUA). This EUA will remain in effect (meaning this test can be used) for the duration of the COVID-19 declaration under Section 564(b)(1) of the Act, 21 U.S.C. section 360bbb-3(b)(1), unless the authorization is terminated or revoked.     Resp Syncytial Virus by PCR NEGATIVE NEGATIVE Final    Comment: (NOTE) Fact Sheet for Patients: BloggerCourse.com  Fact Sheet for Healthcare Providers: SeriousBroker.it  This test is not yet approved or cleared by the [place] FDA and has been authorized for detection and/or diagnosis of SARS-CoV-2 by FDA under an Emergency Use Authorization (EUA). This EUA will remain in effect (meaning this test can be used) for the duration of the COVID-19 declaration under Section 564(b)(1) of the Act, 21 U.S.C. section 360bbb-3(b)(1), unless the authorization is terminated or revoked.  Performed at [facility], [address], [place], [place] [address]      Time coordinating discharge:  I have spent 35 minutes face to face with the patient and on the ward discussing the patients care, assessment, plan and disposition with other care givers. >50% of the time was devoted counseling the patient about the risks and benefits of treatment/Discharge disposition and coordinating care.   SIGNED:   Dimple Nanas, MD  [facility] 04/10/2023, 11:34 AM   If 7PM-7AM, please contact night-coverage

## 2023-04-10 NOTE — Progress Notes (Signed)
Pt has been accepted at [facility]) on TTS 11:45 chair time. Pt can start tomorrow and will need to arrive at 11:00 am to complete paperwork prior to treatment. Met with pt and pt's daughter at bedside. Discussed above arrangements and schedule letter provided. Daughter agreeable to plan. Update provided to attending, nephrologist, pt's RN, and RN CM. Arrangements added to pt's AVS as well. Contacted renal NP to request that orders be sent to clinic for tomorrow's treatment. Clinic aware pt to d/c today and start tomorrow.   Olivia Canter Renal Navigator [phone]

## 2023-04-10 NOTE — Plan of Care (Addendum)
[facility]  Initial Hemodialysis Orders  Dialysis center: 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 Vascular access: Successful access of existing Lt Cimino but has e/o infiltration; access is tortuous and skin very thin. Would not be surprised if he requires a TC sometime next week. Please read for further instructions on the bottom! 3. Please note that patient is NOT discharged on any ABXs!  Allergies:  Allergies  Allergen Reactions   Amoxicillin-Pot Clavulanate Other (See Comments)    Did it involve swelling of the face/tongue/throat, SOB, or low BP? Unknown Did it involve sudden or severe rash/hives, skin peeling, or any reaction on the inside of your mouth or nose? Unknown Did you need to seek medical attention at a hospital or doctor's office? Unknown When did it last happen? patient is unfamiliar with this allergy   If all above answers are "NO", may proceed with cephalosporin use.    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 Please check weekly K+ levels  Dialysis access: Access type: R AVF Date placed: 05/04/2019  Surgeon: Dr. Arbie Cookey Needle gauge: Use 16g needles for now  In Center Medications: Heparin Dose: No bolus for now  VDRA: Calcitriol with HD per protocol Venofer: per protocol Mircera: every 2 weeks Next dose due: 04/14/23. Aranesp last given on 04/07/23  Discharge labs: Hgb: 8.8 K+: 3.3  Ca: Corrected Ca 9.1  Phos: 2.8 Alb:  2.4  Please draw routine labs. Additional labs needed: Draw labs per facility protocol. Check weekly K+ levels.  Additional notes/follow-up:  Please read!!!!! S/p R AVF placed 05/04/2019 by Dr. Arbie Cookey. Noted AVF infiltrated during hospitalization but it appears the access has been working for the last 2 HD treatments. It was noted AVF is tortuous with thin skin. VVS saw patient. We need to monitor this area very closely. I want only experienced staff cannulating this patient. Notify renal team if you guys run into problems. At that point, he will need a [facility] or maybe even a [facility] placement!

## 2023-04-12 NOTE — TOC Transition Note (Signed)
Transition of Care - Initial Contact from Inpatient Facility  Date of discharge: 04/10/23  Date of contact: 04/12/23  Method: Phone Spoke to: Patient's [place]  Spoke with patient's daughter Pedro Young) to discuss transition of care from recent inpatient hospitalization. Patient was admitted to [facility] from 04/06/23-04/10/23 with discharge diagnosis of CKD 5 progressed to ESRD  The discharge medication list was reviewed.  Patient received HD on 04/11/23 at [facility]. Next HD on 04/14/23. Patient's daughter inquired about transferring patient to a closer HD facility to her job. She mentioned [facility] or [facility]. We will need to d/w the SW at [facility] on chair availability.  Pedro Holmes, NP

## 2023-04-18 ENCOUNTER — Encounter (HOSPITAL_COMMUNITY): Payer: Self-pay

## 2023-04-18 ENCOUNTER — Inpatient Hospital Stay (HOSPITAL_COMMUNITY)
Admission: EM | Admit: 2023-04-18 | Discharge: 2023-04-21 | DRG: 091 | Disposition: A | Discharge status: Home / Self Care | Payer: Medicare HMO | Attending: Internal Medicine | Admitting: Internal Medicine

## 2023-04-18 ENCOUNTER — Other Ambulatory Visit: Payer: Self-pay

## 2023-04-18 ENCOUNTER — Emergency Department (HOSPITAL_COMMUNITY): Payer: Medicare HMO

## 2023-04-18 DIAGNOSIS — F329 Major depressive disorder, single episode, unspecified: Secondary | ICD-10-CM | POA: Diagnosis present

## 2023-04-18 DIAGNOSIS — M7989 Other specified soft tissue disorders: Secondary | ICD-10-CM | POA: Diagnosis present

## 2023-04-18 DIAGNOSIS — Z9049 Acquired absence of other specified parts of digestive tract: Secondary | ICD-10-CM

## 2023-04-18 DIAGNOSIS — R443 Hallucinations, unspecified: Secondary | ICD-10-CM | POA: Diagnosis present

## 2023-04-18 DIAGNOSIS — G253 Myoclonus: Secondary | ICD-10-CM | POA: Diagnosis not present

## 2023-04-18 DIAGNOSIS — R54 Age-related physical debility: Secondary | ICD-10-CM | POA: Diagnosis present

## 2023-04-18 DIAGNOSIS — Z681 Body mass index (BMI) 19 or less, adult: Secondary | ICD-10-CM

## 2023-04-18 DIAGNOSIS — Z8249 Family history of ischemic heart disease and other diseases of the circulatory system: Secondary | ICD-10-CM

## 2023-04-18 DIAGNOSIS — T46995A Adverse effect of other agents primarily affecting the cardiovascular system, initial encounter: Secondary | ICD-10-CM | POA: Diagnosis present

## 2023-04-18 DIAGNOSIS — E785 Hyperlipidemia, unspecified: Secondary | ICD-10-CM | POA: Diagnosis present

## 2023-04-18 DIAGNOSIS — H9201 Otalgia, right ear: Secondary | ICD-10-CM | POA: Diagnosis not present

## 2023-04-18 DIAGNOSIS — N400 Enlarged prostate without lower urinary tract symptoms: Secondary | ICD-10-CM | POA: Diagnosis present

## 2023-04-18 DIAGNOSIS — M109 Gout, unspecified: Secondary | ICD-10-CM | POA: Diagnosis present

## 2023-04-18 DIAGNOSIS — Z992 Dependence on renal dialysis: Secondary | ICD-10-CM

## 2023-04-18 DIAGNOSIS — E861 Hypovolemia: Secondary | ICD-10-CM | POA: Diagnosis present

## 2023-04-18 DIAGNOSIS — E872 Acidosis, unspecified: Secondary | ICD-10-CM | POA: Diagnosis present

## 2023-04-18 DIAGNOSIS — Z87891 Personal history of nicotine dependence: Secondary | ICD-10-CM

## 2023-04-18 DIAGNOSIS — M898X9 Other specified disorders of bone, unspecified site: Secondary | ICD-10-CM | POA: Diagnosis present

## 2023-04-18 DIAGNOSIS — G928 Other toxic encephalopathy: Secondary | ICD-10-CM | POA: Diagnosis present

## 2023-04-18 DIAGNOSIS — E038 Other specified hypothyroidism: Secondary | ICD-10-CM | POA: Diagnosis present

## 2023-04-18 DIAGNOSIS — R64 Cachexia: Secondary | ICD-10-CM | POA: Diagnosis present

## 2023-04-18 DIAGNOSIS — I10 Essential (primary) hypertension: Secondary | ICD-10-CM | POA: Diagnosis present

## 2023-04-18 DIAGNOSIS — Z888 Allergy status to other drugs, medicaments and biological substances status: Secondary | ICD-10-CM

## 2023-04-18 DIAGNOSIS — N179 Acute kidney failure, unspecified: Secondary | ICD-10-CM | POA: Diagnosis present

## 2023-04-18 DIAGNOSIS — N2581 Secondary hyperparathyroidism of renal origin: Secondary | ICD-10-CM | POA: Diagnosis present

## 2023-04-18 DIAGNOSIS — D649 Anemia, unspecified: Secondary | ICD-10-CM | POA: Diagnosis not present

## 2023-04-18 DIAGNOSIS — I2510 Atherosclerotic heart disease of native coronary artery without angina pectoris: Secondary | ICD-10-CM | POA: Diagnosis present

## 2023-04-18 DIAGNOSIS — I959 Hypotension, unspecified: Secondary | ICD-10-CM | POA: Diagnosis present

## 2023-04-18 DIAGNOSIS — R531 Weakness: Principal | ICD-10-CM

## 2023-04-18 DIAGNOSIS — Z841 Family history of disorders of kidney and ureter: Secondary | ICD-10-CM

## 2023-04-18 DIAGNOSIS — I120 Hypertensive chronic kidney disease with stage 5 chronic kidney disease or end stage renal disease: Secondary | ICD-10-CM | POA: Diagnosis present

## 2023-04-18 DIAGNOSIS — Z79899 Other long term (current) drug therapy: Secondary | ICD-10-CM

## 2023-04-18 DIAGNOSIS — N186 End stage renal disease: Secondary | ICD-10-CM

## 2023-04-18 DIAGNOSIS — D631 Anemia in chronic kidney disease: Secondary | ICD-10-CM | POA: Diagnosis present

## 2023-04-18 DIAGNOSIS — Z515 Encounter for palliative care: Secondary | ICD-10-CM

## 2023-04-18 DIAGNOSIS — Z880 Allergy status to penicillin: Secondary | ICD-10-CM

## 2023-04-18 DIAGNOSIS — Z66 Do not resuscitate: Secondary | ICD-10-CM | POA: Diagnosis present

## 2023-04-18 HISTORY — DX: Dependence on renal dialysis: Z99.2

## 2023-04-18 HISTORY — DX: End stage renal disease: N18.6

## 2023-04-18 LAB — CBC WITH DIFFERENTIAL/PLATELET
Abs Immature Granulocytes: 0.02 10*3/uL (ref 0.00–0.07)
Basophils Absolute: 0 10*3/uL (ref 0.0–0.1)
Basophils Relative: 0 %
Eosinophils Absolute: 0.3 10*3/uL (ref 0.0–0.5)
Eosinophils Relative: 5 %
HCT: 22.7 % — ABNORMAL LOW (ref 39.0–52.0)
Hemoglobin: 7.3 g/dL — ABNORMAL LOW (ref 13.0–17.0)
Immature Granulocytes: 0 %
Lymphocytes Relative: 21 %
Lymphs Abs: 1.3 10*3/uL (ref 0.7–4.0)
MCH: 32.4 pg (ref 26.0–34.0)
MCHC: 32.2 g/dL (ref 30.0–36.0)
MCV: 100.9 fL — ABNORMAL HIGH (ref 80.0–100.0)
Monocytes Absolute: 0.9 10*3/uL (ref 0.1–1.0)
Monocytes Relative: 15 %
Neutro Abs: 3.5 10*3/uL (ref 1.7–7.7)
Neutrophils Relative %: 59 %
Platelets: 175 10*3/uL (ref 150–400)
RBC: 2.25 MIL/uL — ABNORMAL LOW (ref 4.22–5.81)
RDW: 15.8 % — ABNORMAL HIGH (ref 11.5–15.5)
WBC: 6 10*3/uL (ref 4.0–10.5)
nRBC: 0 % (ref 0.0–0.2)

## 2023-04-18 LAB — PROTIME-INR
INR: 1.1 (ref 0.8–1.2)
Prothrombin Time: 14.1 seconds (ref 11.4–15.2)

## 2023-04-18 LAB — URINALYSIS, ROUTINE W REFLEX MICROSCOPIC
Bacteria, UA: NONE SEEN
Glucose, UA: NEGATIVE mg/dL
Hgb urine dipstick: NEGATIVE
Ketones, ur: NEGATIVE mg/dL
Leukocytes,Ua: NEGATIVE
Nitrite: NEGATIVE
Protein, ur: 100 mg/dL — AB
Specific Gravity, Urine: 1.015 (ref 1.005–1.030)
pH: 7 (ref 5.0–8.0)

## 2023-04-18 LAB — LACTIC ACID, PLASMA
Lactic Acid, Venous: 0.7 mmol/L (ref 0.5–1.9)
Lactic Acid, Venous: 0.9 mmol/L (ref 0.5–1.9)

## 2023-04-18 LAB — COMPREHENSIVE METABOLIC PANEL
ALT: 18 U/L (ref 0–44)
AST: 23 U/L (ref 15–41)
Albumin: 2.5 g/dL — ABNORMAL LOW (ref 3.5–5.0)
Alkaline Phosphatase: 83 U/L (ref 38–126)
Anion gap: 10 (ref 5–15)
BUN: 56 mg/dL — ABNORMAL HIGH (ref 8–23)
CO2: 30 mmol/L (ref 22–32)
Calcium: 7.7 mg/dL — ABNORMAL LOW (ref 8.9–10.3)
Chloride: 96 mmol/L — ABNORMAL LOW (ref 98–111)
Creatinine, Ser: 4.04 mg/dL — ABNORMAL HIGH (ref 0.61–1.24)
GFR, Estimated: 14 mL/min — ABNORMAL LOW (ref >60)
Glucose, Bld: 101 mg/dL — ABNORMAL HIGH (ref 70–99)
Potassium: 4.3 mmol/L (ref 3.5–5.1)
Sodium: 136 mmol/L (ref 135–145)
Total Bilirubin: 1 mg/dL (ref 0.3–1.2)
Total Protein: 5.8 g/dL — ABNORMAL LOW (ref 6.5–8.1)

## 2023-04-18 LAB — PHOSPHORUS: Phosphorus: 3.1 mg/dL (ref 2.5–4.6)

## 2023-04-18 LAB — TYPE AND SCREEN
ABO/RH(D): A POS
Antibody Screen: NEGATIVE

## 2023-04-18 LAB — CK: Total CK: 77 U/L (ref 49–397)

## 2023-04-18 LAB — PREPARE RBC (CROSSMATCH)

## 2023-04-18 LAB — VITAMIN B12: Vitamin B-12: 556 pg/mL (ref 180–914)

## 2023-04-18 LAB — BPAM RBC
Blood Product Expiration Date: 202407022359
ISSUE DATE / TIME: 202406151744

## 2023-04-18 LAB — TSH: TSH: 10.749 u[IU]/mL — ABNORMAL HIGH (ref 0.350–4.500)

## 2023-04-18 MED ORDER — METOPROLOL TARTRATE 25 MG PO TABS: 25.0000 mg | ORAL_TABLET | Freq: Two times a day (BID) | ORAL | 0 refills | Status: DC

## 2023-04-18 MED ORDER — SODIUM CHLORIDE 0.9% IV SOLUTION: Freq: Once | INTRAVENOUS | Status: AC

## 2023-04-18 MED ORDER — TAMSULOSIN HCL 0.4 MG PO CAPS
0.4000 mg | ORAL_CAPSULE | Freq: Every day | ORAL | Status: DC
  Administered 2023-04-19 – 2023-04-21 (×3): 0.4 mg via ORAL
  Filled 2023-04-18 (×3): qty 1

## 2023-04-18 MED ORDER — ACETAMINOPHEN 650 MG RE SUPP: 650.0000 mg | Freq: Four times a day (QID) | RECTAL | Status: DC | PRN

## 2023-04-18 MED ORDER — ISOSORBIDE MONONITRATE ER 60 MG PO TB24: 60.0000 mg | ORAL_TABLET | Freq: Every day | ORAL | 0 refills | Status: DC

## 2023-04-18 MED ORDER — BENZONATATE 100 MG PO CAPS: 100.0000 mg | ORAL_CAPSULE | Freq: Three times a day (TID) | ORAL | Status: DC | PRN

## 2023-04-18 MED ORDER — ONDANSETRON HCL 4 MG/2ML IJ SOLN: 4.0000 mg | Freq: Four times a day (QID) | INTRAMUSCULAR | Status: DC | PRN

## 2023-04-18 MED ORDER — ROSUVASTATIN CALCIUM 20 MG PO TABS
40.0000 mg | ORAL_TABLET | Freq: Every day | ORAL | Status: DC
  Administered 2023-04-19 – 2023-04-21 (×3): 40 mg via ORAL
  Filled 2023-04-18 (×3): qty 2

## 2023-04-18 MED ORDER — ONDANSETRON HCL 4 MG PO TABS: 4.0000 mg | ORAL_TABLET | Freq: Four times a day (QID) | ORAL | Status: DC | PRN

## 2023-04-18 MED ORDER — METOPROLOL TARTRATE 5 MG/5ML IV SOLN: 5.0000 mg | Freq: Three times a day (TID) | INTRAVENOUS | Status: DC | PRN

## 2023-04-18 MED ORDER — SODIUM CHLORIDE 0.9 % IV SOLN: INTRAVENOUS | Status: DC

## 2023-04-18 MED ORDER — ACETAMINOPHEN 325 MG PO TABS
650.0000 mg | ORAL_TABLET | Freq: Four times a day (QID) | ORAL | Status: DC | PRN
  Administered 2023-04-19 – 2023-04-20 (×4): 650 mg via ORAL
  Filled 2023-04-18 (×5): qty 2

## 2023-04-18 MED ORDER — RANOLAZINE ER 500 MG PO TB12
500.0000 mg | ORAL_TABLET | Freq: Two times a day (BID) | ORAL | Status: DC
  Administered 2023-04-19 (×2): 500 mg via ORAL
  Filled 2023-04-18 (×3): qty 1

## 2023-04-18 MED ORDER — CHLORHEXIDINE GLUCONATE CLOTH 2 % EX PADS: 6.0000 | MEDICATED_PAD | Freq: Every day | CUTANEOUS | Status: DC

## 2023-04-18 NOTE — ED Notes (Signed)
1 of 1 unit of RBC infusing via 18G to RAC. Pt sitting upright in bed, respirations even,unlabored. Daughter present at bedside. Will continue to monitor.

## 2023-04-18 NOTE — Assessment & Plan Note (Signed)
Continue rosuvastatin.  

## 2023-04-18 NOTE — ED Provider Notes (Signed)
[place] EMERGENCY DEPARTMENT AT [facility] Provider Note   CSN: [id number] Arrival date & time: 04/18/23  1237     History  Chief Complaint  Patient presents with   Weakness    Pedro Young is a 89 y.o. male.  HPI    89 y.o. male with PMH significant fo HTN, HLD, CAD recent initiation of HD due to ESRD.  Patient accompanied by his daughter.  According to the daughter, patient was started on hemodialysis last Saturday.  Today when he went for dialysis, he was complaining of some weakness in his legs.  When they got to the dialysis, and she got her outside of her car, patient started feeling dizzy and he felt that his legs could not hold out.  Patient states that he could not stay up, and felt dizzy.  The dialysis center checked his blood pressure and noted that his systolic was in the 80s, and EMS was called.  Patient did not receive any dialysis.  Daughter also indicates that patient's blood pressure medications were not adjusted despite initiation of dialysis.  She is concerned that patient might be taking too much of antihypertensives.  Patient states that he feels a lot better now, feels back to normal.  He had no chest pain, palpitations, shortness of breath when this occurred.  He has been eating and drinking otherwise well.  He is still making urine.   Home Medications Prior to Admission medications   Medication Sig Start Date End Date Taking? Authorizing Provider  benzonatate (TESSALON) 100 MG capsule Take 100 mg by mouth 3 (three) times daily as needed for cough. 04/03/23 04/02/24  [provider]  colchicine 0.6 MG tablet Take 0.6 mg by mouth 2 (two) times daily. 02/24/23   [provider]  Darbepoetin Alfa (ARANESP) 60 MCG/0.3ML SOSY injection Inject 0.3 mLs (60 mcg total) into the skin every 7 (seven) days. 04/14/23   Amin, Loura Halt, MD  diclofenac Sodium (VOLTAREN) 1 % GEL Apply 2 g topically 4 (four) times daily as needed (joint  pain). 08/31/21   [provider]  furosemide (LASIX) 40 MG tablet Take 40 mg by mouth daily. Prescribed by Dr Eliott [facility]) since May 2016    [provider]  isosorbide mononitrate (IMDUR) 60 MG 24 hr tablet Take 1 tablet (60 mg total) by mouth daily. 04/18/23   Derwood Kaplan, MD  metoprolol tartrate (LOPRESSOR) 25 MG tablet Take 1 tablet (25 mg total) by mouth 2 (two) times daily. 04/18/23 05/18/23  Derwood Kaplan, MD  nitroGLYCERIN (NITROSTAT) 0.4 MG SL tablet PLACE 1 TABLET UNDER THE TONGUE EVERY 5 MINUTES AS NEEDED FOR CHEST PAIN Patient taking differently: Place 0.4 mg under the tongue every 5 (five) minutes as needed for chest pain. 10/16/21   O'NealRonnald Ramp, MD  ranolazine (RANEXA) 500 MG 12 hr tablet Take 500 mg by mouth 2 (two) times daily. 02/13/23   [provider]  rosuvastatin (CRESTOR) 40 MG tablet TAKE 1 TABLET BY MOUTH EVERY DAY Patient taking differently: Take 40 mg by mouth daily. 07/24/22   O'NealRonnald Ramp, MD  tamsulosin (FLOMAX) 0.4 MG CAPS capsule Take 0.4 mg by mouth daily. 06/14/20   [provider]      Allergies    Amoxicillin-pot clavulanate, Atorvastatin, and Lovastatin    Review of Systems   Review of Systems  All other systems reviewed and are negative.   Physical Exam Updated Vital Signs BP (!) 117/58   Pulse 68  Temp 97.6 F (36.4 C) (Oral)   Resp 20   Ht 5\' 5"  (1.651 m)   Wt 45.4 kg   SpO2 98%   BMI 16.64 kg/m  Physical Exam Vitals and nursing note reviewed.  Constitutional:      Appearance: He is well-developed.  HENT:     Head: Atraumatic.  Cardiovascular:     Rate and Rhythm: Normal rate.  Pulmonary:     Effort: Pulmonary effort is normal.  Musculoskeletal:     Cervical back: Neck supple.  Skin:    General: Skin is warm.  Neurological:     Mental Status: He is alert and oriented to person, place, and time.     ED Results / Procedures / Treatments   Labs (all labs  ordered are listed, but only abnormal results are displayed) Labs Reviewed  COMPREHENSIVE METABOLIC PANEL - Abnormal; Notable for the following components:      Result Value   Chloride 96 (*)    Glucose, Bld 101 (*)    BUN 56 (*)    Creatinine, Ser 4.04 (*)    Calcium 7.7 (*)    Total Protein 5.8 (*)    Albumin 2.5 (*)    GFR, Estimated 14 (*)    All other components within normal limits  CBC WITH DIFFERENTIAL/PLATELET - Abnormal; Notable for the following components:   RBC 2.25 (*)    Hemoglobin 7.3 (*)    HCT 22.7 (*)    MCV 100.9 (*)    RDW 15.8 (*)    All other components within normal limits  PROTIME-INR  LACTIC ACID, PLASMA  CK  LACTIC ACID, PLASMA  URINALYSIS, ROUTINE W REFLEX MICROSCOPIC    EKG None  Radiology DG Chest Port 1 View  Result Date: 04/18/2023 CLINICAL DATA:  Weakness, concern for pneumonia. EXAM: PORTABLE CHEST 1 VIEW COMPARISON:  Chest radiograph dated 04/06/2023. FINDINGS: The heart size and mediastinal contours are within normal limits. Vascular calcifications are seen in the aortic arch. Both lungs are clear. The visualized skeletal structures are unremarkable. IMPRESSION: No active cardiopulmonary disease. Electronically Signed   By: Romona Curls M.D.   On: 04/18/2023 15:29    Procedures .Critical Care  Performed by: Derwood Kaplan, MD Authorized by: Derwood Kaplan, MD   Critical care provider statement:    Critical care time (minutes):  38   Critical care time was exclusive of:  Separately billable procedures and treating other patients   Critical care was necessary to treat or prevent imminent or life-threatening deterioration of the following conditions:  Circulatory failure   Critical care was time spent personally by me on the following activities:  Development of treatment plan with patient or surrogate, discussions with consultants, evaluation of patient's response to treatment, examination of patient, ordering and review of laboratory  studies, ordering and review of radiographic studies, ordering and performing treatments and interventions, pulse oximetry, re-evaluation of patient's condition, review of old charts and obtaining history from patient or surrogate     Medications Ordered in ED Medications - No data to display  ED Course/ Medical Decision Making/ A&P Clinical Course as of 04/18/23 1621  Sat Apr 18, 2023  1602 Comprehensive metabolic panel(!) CMP is overall reassuring. [AN]  1602 Hemoglobin(!): 7.3 Patient's hemoglobin is 7.3.  Hemoglobin is dropped from 8.8 a week ago. [AN]  1603 I consulted nephrology and spoke with Dr. Chales Abrahams about patient's antihypertensives. He looked through patient's chart and has recommended the following: Cutting patient's metoprolol to 25 mg  twice daily. Discontinuing amlodipine 10 mg. Reducing Imdur to 60 mg daily.  Additionally he indicated that patient should not take any antihypertensives on the day of hemodialysis until after the dialysis.  Finally, it is completely fine for patient to not take blood pressure medication if his systolic is less than 110 on any day.  These recommendations have been discussed with the patient and family member, they are comfortable with it. [AN]  1620 Hemoglobin(!): 7.3 With hemoglobin of 7.3, missed dialysis today, patient's advanced age, him complaining of generalized weakness and symptomatic from his anemia, we will proceed with admission request.  Dr. Chales Abrahams from nephrology aware.  He will likely be dialyzed tomorrow.  Patient has consented for transfusion. [AN]    Clinical Course User Index [AN] Derwood Kaplan, MD                             Medical Decision Making Amount and/or Complexity of Data Reviewed Labs: ordered. Decision-making details documented in ED Course. Radiology: ordered.  Risk Prescription drug management.   This patient presents to the ED with chief complaint(s) of low blood pressure, generalized weakness  in the lower extremity with pertinent past medical history of ESRD on hemodialysis that was initiated recently, diabetes, hypertension.The complaint involves an extensive differential diagnosis and also carries with it a high risk of complications and morbidity.    The differential diagnosis includes : Orthostatic dizziness, hypotension secondary to excessive antihypertensive medications, worsening anemia, severe electrolyte abnormality.  The initial plan is to get basic labs to ensure that the sodium, potassium, calcium are not significantly deranged.  Additional history obtained: Additional history obtained from daughter Records reviewed previous admission documents  Independent labs interpretation:  The following labs were independently interpreted: Hemoglobin is 7.3.  Independent visualization and interpretation of imaging: - I independently visualized the following imaging with scope of interpretation limited to determining acute life threatening conditions related to emergency care: X-ray of the chest, which revealed no evidence of volume overload  Treatment and Reassessment: Patient reassessed multiple times.  Ultimately the plan is to admit him because of hemoglobin of 7.3.  He is consented for it.  Consultation: - Consulted or discussed management/test interpretation with external professional: Nephrology service.  They will dialyze the patient tomorrow.  The also recommended some blood pressure medication change. Final Clinical Impression(s) / ED Diagnoses Final diagnoses:  Generalized weakness  Severe anemia  ESRD (end stage renal disease) on dialysis [facility])    Rx / DC Orders ED Discharge Orders          Ordered    isosorbide mononitrate (IMDUR) 60 MG 24 hr tablet  Daily        04/18/23 1559    metoprolol tartrate (LOPRESSOR) 25 MG tablet  2 times daily        04/18/23 1559              Derwood Kaplan, MD 04/18/23 1623

## 2023-04-18 NOTE — Assessment & Plan Note (Addendum)
89 year old male presenting with  history of weakness, dizziness and hypotension found to have hgb down to 7.3 from baseline of 9.0 just one week ago -obs to tele -known ACD from CKD>ESRD on dialysis. No active bleed -transfuse one unit PRBC -nephrology following  -check B12/folate -check fecal occult  -aranesp per nephrology -trend cbc

## 2023-04-18 NOTE — Assessment & Plan Note (Addendum)
Blood pressure medication has not been adjusted after recent transition to ESRD and now having hypotensive episodes Per nephrology: decrease metoprolol to 25mg  BID, stop norvasc and decrease imdur to 60mg   Hold anti-Htn medication on dialysis days until after dialysis  Will have parameters to hold anti-hypertensive for systolic <110  Hold for now since hypotensive  Orthostatics not complete, but wnl. Will order again

## 2023-04-18 NOTE — Consult Note (Signed)
Renal Service Consult Note Practice [facility]  Pedro Young 04/18/2023 Maree Krabbe, MD Requesting Physician: Dr. Artis Flock, A.   Reason for Consult: ESRD pt w/ hypotension HPI: The patient is a 89 y.o. year-old w/ PMH as below who presented to ED brought to ED by EMS after pt was too weak to stand up on his legs getting out of the car. Pt was not dialyzed. He just started dialysis last week at [facility] and has had 3 outpt HD sessions per the daughter which have gone fairly well. BP's per EMS were in the 80s. Daughter states that his BP meds were not adjusted when he started dialysis recently. She is concerned that he might to be taking too much BP medication. In ED BP's are 102/50- 116/ 64. HR 60s, RR 18.  Pt unable to stand for orthostatics. Labs were okay, creat 4.0, K+ 4.3. Hb low at 7.3. May need transfusion. Pt will be admitted. HTN medications will be lowered or held prn. We are asked to see for ESRD.    Pt seen in ED.  Pt feeling better here. Mouth is quite dry. No SOB, cough or CP. No abd pain or n/v/d.   ROS - denies CP, no joint pain, no HA, no blurry vision, no rash, no diarrhea, no nausea/ vomiting, no dysuria, no difficulty voiding   Past Medical History  Past Medical History:  Diagnosis Date   Cancer (HCC)    Hypertension    Renal disorder    CRF   Past Surgical History  Past Surgical History:  Procedure Laterality Date   AV FISTULA PLACEMENT Left 05/04/2019   Procedure: Arteriovenous (Av) Fistula Creation Left Arm;  Surgeon: Larina Earthly, MD;  Location[facility] OR;  Service: Vascular;  Laterality: Left;   BIOPSY  08/10/2019   Procedure: BIOPSY;  Surgeon: Lynann Bologna, MD;  Location: [facility]OSCOPY;  Service: Endoscopy;;   CHOLECYSTECTOMY     COLONOSCOPY N/A 08/10/2019   Procedure: COLONOSCOPY;  Surgeon: Lynann Bologna, MD;  Location: [facility]OS[facility];  Service: Endoscopy;  Laterality: N/A;   EYE SURGERY Bilateral    cataract   LEFT HEART CATH AND CORONARY ANGIOGRAPHY N/A  06/19/2020   Procedure: LEFT HEART CATH AND CORONARY ANGIOGRAPHY;  Surgeon: Lyn Records, MD;  Location: [facility] INVASIVE CV LAB;  Service: Cardiovascular;  Laterality: N/A;   POLYPECTOMY  08/10/2019   Procedure: POLYPECTOMY;  Surgeon: Lynann Bologna, MD;  Location: [facility];  Service: Endoscopy;;   Family History  Family History  Problem Relation Age of Onset   Liver disease Father    Kidney disease Father    Heart disease Father        before age 73   Heart disease Mother        before age 71   Social History  reports that he quit smoking about 68 years ago. His smoking use included cigarettes. He quit smokeless tobacco use about 68 years ago. He reports that he does not drink alcohol and does not use drugs. Allergies  Allergies  Allergen Reactions   Amoxicillin-Pot Clavulanate Other (See Comments)    Did it involve swelling of the face/tongue/throat, SOB, or low BP? Unknown Did it involve sudden or severe rash/hives, skin peeling, or any reaction on the inside of your mouth or nose? Unknown Did you need to seek medical attention at a hospital or doctor's office? Unknown When did it last happen? patient is unfamiliar with this allergy   If all above answers are "NO",  may proceed with cephalosporin use.    Atorvastatin     REACTION: arthralgia   Lovastatin     REACTION: arthralgia   Home medications Prior to Admission medications   Medication Sig Start Date End Date Taking? Authorizing Provider  benzonatate (TESSALON) 100 MG capsule Take 100 mg by mouth 3 (three) times daily as needed for cough. 04/03/23 04/02/24  [provider]  colchicine 0.6 MG tablet Take 0.6 mg by mouth 2 (two) times daily. 02/24/23   [provider]  Darbepoetin Alfa (ARANESP) 60 MCG/0.3ML SOSY injection Inject 0.3 mLs (60 mcg total) into the skin every 7 (seven) days. 04/14/23   Amin, Loura Halt, MD  diclofenac Sodium (VOLTAREN) 1 % GEL Apply 2 g topically 4 (four) times daily as needed  (joint pain). 08/31/21   [provider]  furosemide (LASIX) 40 MG tablet Take 40 mg by mouth daily. Prescribed by Dr [facility] since May 2016    [provider]  isosorbide mononitrate (IMDUR) 60 MG 24 hr tablet Take 1 tablet (60 mg total) by mouth daily. 04/18/23   Derwood Kaplan, MD  metoprolol tartrate (LOPRESSOR) 25 MG tablet Take 1 tablet (25 mg total) by mouth 2 (two) times daily. 04/18/23 05/18/23  Derwood Kaplan, MD  nitroGLYCERIN (NITROSTAT) 0.4 MG SL tablet PLACE 1 TABLET UNDER THE TONGUE EVERY 5 MINUTES AS NEEDED FOR CHEST PAIN Patient taking differently: Place 0.4 mg under the tongue every 5 (five) minutes as needed for chest pain. 10/16/21   O'NealRonnald Ramp, MD  ranolazine (RANEXA) 500 MG 12 hr tablet Take 500 mg by mouth 2 (two) times daily. 02/13/23   [provider]  rosuvastatin (CRESTOR) 40 MG tablet TAKE 1 TABLET BY MOUTH EVERY DAY Patient taking differently: Take 40 mg by mouth daily. 07/24/22   O'NealRonnald Ramp, MD  tamsulosin (FLOMAX) 0.4 MG CAPS capsule Take 0.4 mg by mouth daily. 06/14/20   [provider]     Vitals:   04/18/23 1500 04/18/23 1515 04/18/23 1530 04/18/23 1545  BP: (!) 108/57 118/63 117/61 (!) 116/59  Pulse: 65 65 66 64  Resp: 18 19 18 15   Temp:      TempSrc:      SpO2: 97% 100% 98% 97%  Weight:      Height:       Exam Gen alert, elderly and moderately frail No rash, cyanosis or gangrene Sclera anicteric, throat is dry No jvd or bruits Chest clear bilat to bases, no rales/ wheezing RRR no MRG Abd soft ntnd no mass or ascites +bs GU normal male MS no joint effusions or deformity Ext no LE or UE edema, no wounds or ulcers Neuro is alert, Ox 3 , nf    LFA AVF+ bruit     Home meds include - colchicine, lasix 40 every day, imdur 90/d,  metoprolol 50 bid, sl ntg, ranexa, crestor, flomax, amlodipine 10, prns/ vits/ supps     OP HD: [facility] TTS  3.5h  350/400   46.6kg   3K/2Ca bath   AVF   Heparin none - last HD 6/13, post wt 47.9kg - not on vdra or esa   Assessment/ Plan: Gen'd weakness - due to hypotension, hypovolemia and anemia most likely. Will cut back on his HTN regimen and place hold orders if SBP < 120. Looks dry on exam --> give 0.9% NS IVF's overnight. No fluid off w/ HD. Will get 1 unit prbc's as well.  Acute on chronic anemia -  will get 1u prbc's tonight. Pt got 60 mcg aranesp on 6/11 while here last admit. Not due til 6/18 - if still here would reorder esa. Tsat was 23% on 6/04, not sure if he got IV Fe or not, consulting pharmacy.  ESRD - on HD TTS. Started HD here on 6/04. Has had outpt HD x 3 at [facility]. Next HD tonight, gentle run w/ 3 hr and no UF.  HTN - pt vol depleted and BP's down. Would continue to hold norvasc, metoprolol and imdur for now. Start back BB then imdur, if needed.  Anemia esrd - as above MBD ckd - CCa in range, add on phos.  CAD - medical rx HL - per pmd   Vinson Moselle  MD [facility] 04/18/2023, 5:28 PM  Recent Labs  Lab 04/18/23 1412  HGB 7.3*  ALBUMIN 2.5*  CALCIUM 7.7*  CREATININE 4.04*  K 4.3   Inpatient medications:  sodium chloride   Intravenous Once    acetaminophen **OR** acetaminophen, metoprolol tartrate, ondansetron **OR** ondansetron (ZOFRAN) IV

## 2023-04-18 NOTE — H&P (Signed)
History and Physical    Patient: Pedro Young ZOX:[id number] DOB: 1933-06-17 DOA: 04/18/2023 DOS: the patient was seen and examined on 04/18/2023 PCP: Maudie Flakes, FNP  Patient coming from: Home - lives with his wife and another daughter who is helping to take care of him. Cane at home.    Chief Complaint: weakness and dizziness with hypotension.   HPI: Pedro Young is a 89 y.o. male with medical history significant of HTN, HLD, CAD, depression, gout and recent CKD V>ESRD on dialysis TTS who presented to ED with complaints of weakness and dizziness. His daughter reports he had a normal morning. When he got to dialysis he got out of his car and got dizzy and legs gave out. He did not loose consciousness, but he got limp and disoriented. He didn't fall or hit his head. He just sat on the ground. Blood pressure checked and systolic was 80 so sent to ED. He did not get any dialysis today.   Denies any fever/chills, vision changes/headaches, chest pain or palpitations, shortness of breath, +cough, abdominal pain, N/V/D, dysuria. Mild leg swelling today. Denies any bleeding, dark or tarry stool or blood in stool.   Just admitted 6/3-6/7 for acute on chronic renal failure stage V. HD started in hospital via left upper extremity AV fistula.   He does not smoke or drink alcohol.   ER Course:  vitals: afebrile, bp: 106/57, HR: 64, RR: 15, oxygen: 97%RA Pertinent labs: hgb: 7.3, BUN: 56, creatinine: 4.04,  CXR: wnl  In ED: nephrology consulted. TRH asked to admit. Needs 1 unit of PRBC.    Review of Systems: As mentioned in the history of present illness. All other systems reviewed and are negative. Past Medical History:  Diagnosis Date   Cancer [facility])    Hypertension    Renal disorder    CRF   Past Surgical History:  Procedure Laterality Date   AV FISTULA PLACEMENT Left 05/04/2019   Procedure: Arteriovenous (Av) Fistula Creation Left Arm;  Surgeon: Larina Earthly, MD;  Location: [facility] OR;   Service: Vascular;  Laterality: Left;   BIOPSY  08/10/2019   Procedure: BIOPSY;  Surgeon: Lynann Bologna, MD;  Location: [facility];  Service: Endoscopy;;   CHOLECYSTECTOMY     COLONOSCOPY N/A 08/10/2019   Procedure: COLONOSCOPY;  Surgeon: Lynann Bologna, MD;  Location: [facility];  Service: Endoscopy;  Laterality: N/A;   EYE SURGERY Bilateral    cataract   LEFT HEART CATH AND CORONARY ANGIOGRAPHY N/A 06/19/2020   Procedure: LEFT HEART CATH AND CORONARY ANGIOGRAPHY;  Surgeon: Lyn Records, MD;  Location: [facility] INVASIVE CV LAB;  Service: Cardiovascular;  Laterality: N/A;   POLYPECTOMY  08/10/2019   Procedure: POLYPECTOMY;  Surgeon: Lynann Bologna, MD;  Location: [facility];  Service: Endoscopy;;   Social History:  reports that he quit smoking about 68 years ago. His smoking use included cigarettes. He quit smokeless tobacco use about 68 years ago. He reports that he does not drink alcohol and does not use drugs.  Allergies  Allergen Reactions   Amoxicillin-Pot Clavulanate Other (See Comments)    Did it involve swelling of the face/tongue/throat, SOB, or low BP? Unknown Did it involve sudden or severe rash/hives, skin peeling, or any reaction on the inside of your mouth or nose? Unknown Did you need to seek medical attention at a hospital or doctor's office? Unknown When did it last happen? patient is unfamiliar with this allergy   If all above answers are "NO", may proceed  with cephalosporin use.    Atorvastatin     REACTION: arthralgia   Lovastatin     REACTION: arthralgia    Family History  Problem Relation Age of Onset   Liver disease Father    Kidney disease Father    Heart disease Father        before age 41   Heart disease Mother        before age 57    Prior to Admission medications   Medication Sig Start Date End Date Taking? Authorizing Provider  benzonatate (TESSALON) 100 MG capsule Take 100 mg by mouth 3 (three) times daily as needed for cough. 04/03/23 04/02/24   [provider]  colchicine 0.6 MG tablet Take 0.6 mg by mouth 2 (two) times daily. 02/24/23   [provider]  Darbepoetin Alfa (ARANESP) 60 MCG/0.3ML SOSY injection Inject 0.3 mLs (60 mcg total) into the skin every 7 (seven) days. 04/14/23   Amin, Loura Halt, MD  diclofenac Sodium (VOLTAREN) 1 % GEL Apply 2 g topically 4 (four) times daily as needed (joint pain). 08/31/21   [provider]  furosemide (LASIX) 40 MG tablet Take 40 mg by mouth daily. Prescribed by Dr [facility]) since May 2016    [provider]  isosorbide mononitrate (IMDUR) 60 MG 24 hr tablet Take 1 tablet (60 mg total) by mouth daily. 04/18/23   Derwood Kaplan, MD  metoprolol tartrate (LOPRESSOR) 25 MG tablet Take 1 tablet (25 mg total) by mouth 2 (two) times daily. 04/18/23 05/18/23  Derwood Kaplan, MD  nitroGLYCERIN (NITROSTAT) 0.4 MG SL tablet PLACE 1 TABLET UNDER THE TONGUE EVERY 5 MINUTES AS NEEDED FOR CHEST PAIN Patient taking differently: Place 0.4 mg under the tongue every 5 (five) minutes as needed for chest pain. 10/16/21   O'NealRonnald Ramp, MD  ranolazine (RANEXA) 500 MG 12 hr tablet Take 500 mg by mouth 2 (two) times daily. 02/13/23   [provider]  rosuvastatin (CRESTOR) 40 MG tablet TAKE 1 TABLET BY MOUTH EVERY DAY Patient taking differently: Take 40 mg by mouth daily. 07/24/22   O'NealRonnald Ramp, MD  tamsulosin (FLOMAX) 0.4 MG CAPS capsule Take 0.4 mg by mouth daily. 06/14/20   [provider]    Physical Exam: Vitals:   04/18/23 1500 04/18/23 1515 04/18/23 1530 04/18/23 1545  BP: (!) 108/57 118/63 117/61 (!) 116/59  Pulse: 65 65 66 64  Resp: 18 19 18 15   Temp:      TempSrc:      SpO2: 97% 100% 98% 97%  Weight:      Height:       General:  Appears calm and comfortable and is in NAD. Thin appearing  Eyes:  PERRL, EOMI, normal lids, iris ENT:  grossly normal hearing, lips & tongue, mmm; appropriate dentition Neck:  no LAD, masses  or thyromegaly; no carotid bruits Cardiovascular:  RRR, no m/r/g. No LE edema.  Respiratory:   CTA bilaterally with no wheezes/rales/rhonchi.  Normal respiratory effort. Abdomen:  soft, NT, ND, NABS Back:   normal alignment, no CVAT Skin:  no rash or induration seen on limited exam. AV fistula on left forearm with good thrill +skin tenting  Musculoskeletal:  grossly normal tone BUE/BLE, good ROM, no bony abnormality Lower extremity:  No LE edema.  Limited foot exam with no ulcerations.  2+ distal pulses. Psychiatric:  grossly normal mood and affect, speech fluent and appropriate, AOx3 Neurologic:  CN 2-12 grossly intact, moves all extremities in  coordinated fashion, sensation intact   Radiological Exams on Admission: Independently reviewed - see discussion in A/P where applicable  DG Chest Port 1 View  Result Date: 04/18/2023 CLINICAL DATA:  Weakness, concern for pneumonia. EXAM: PORTABLE CHEST 1 VIEW COMPARISON:  Chest radiograph dated 04/06/2023. FINDINGS: The heart size and mediastinal contours are within normal limits. Vascular calcifications are seen in the aortic arch. Both lungs are clear. The visualized skeletal structures are unremarkable. IMPRESSION: No active cardiopulmonary disease. Electronically Signed   By: Romona Curls M.D.   On: 04/18/2023 15:29    EKG: Independently reviewed.  NSR with rate 65; nonspecific ST changes with no evidence of acute ischemia   Labs on Admission: I have personally reviewed the available labs and imaging studies at the time of the admission.  Pertinent labs:   hgb: 7.3,  BUN: 56,  creatinine: 4.04,  Assessment and Plan: Principal Problem:   Acute on chronic anemia Active Problems:   ESRD on dialysis (HCC)   HYPERTENSION, BENIGN ESSENTIAL   Subclinical hypothyroidism   CAD (coronary artery disease)   Hyperlipidemia   BPH (benign prostatic hyperplasia)    Assessment and Plan: * Acute on chronic anemia 89 year old male presenting  with  history of weakness, dizziness and hypotension found to have hgb down to 7.3 from baseline of 9.0 just one week ago -obs to tele -known ACD from CKD>ESRD on dialysis. No active bleed -transfuse one unit PRBC -nephrology following  -check B12/folate -check fecal occult  -trend cbc   ESRD on dialysis [facility]) Recent transition from CKD stage V>ESRD after hospitalization on 6/3-6/7 ESRD on TTS, did not have session today  Nephrology consulted Will see and optimize dialysis while in hospital, appreciate  Very dry on exam, no fluid restriction today/tomorrow   HYPERTENSION, BENIGN ESSENTIAL Blood pressure medication has not been adjusted after recent transition to ESRD and now having hypotensive episodes Per nephrology: decrease metoprolol to 25mg  BID, stop norvasc and decrease imdur to 60mg   Hold anti-Htn medication on dialysis days until after dialysis  Will have parameters to hold anti-hypertensive for systolic <110  Hold for now since hypotensive  Orthostatics not complete, but wnl. Will order again   Subclinical hypothyroidism No updated TSH in system Will repeat with weakness complaints   CAD (coronary artery disease) Continue medical management   Hyperlipidemia Continue home statin   BPH (benign prostatic hyperplasia) Continue flomax     Advance Care Planning:   Code Status: DNR   Consults: nephrology; Dr. Arlean Hopping   DVT Prophylaxis: SCDs   Family Communication: daughter bedside   Severity of Illness: The appropriate patient status for this patient is OBSERVATION. Observation status is judged to be reasonable and necessary in order to provide the required intensity of service to ensure the patient's safety. The patient's presenting symptoms, physical exam findings, and initial radiographic and laboratory data in the context of their medical condition is felt to place them at decreased risk for further clinical deterioration. Furthermore, it is anticipated that the  patient will be medically stable for discharge from the hospital within 2 midnights of admission.   Author: Orland Mustard, MD 04/18/2023 5:22 PM  For on call review [email or web address].

## 2023-04-18 NOTE — Assessment & Plan Note (Addendum)
Recent transition from CKD stage V>ESRD after hospitalization on 6/3-6/7 ESRD on TTS, did not have session today  Nephrology consulted Will see and optimize dialysis while in hospital, appreciate  Very dry on exam, no fluid restriction today/tomorrow

## 2023-04-18 NOTE — Assessment & Plan Note (Signed)
Continue medical management 

## 2023-04-18 NOTE — ED Triage Notes (Signed)
Pt present to ED from dialysis with c/o BLE weakness, hypotension. Pt states BLE weakness onset yesterday and this morning prior to arriving for dialysis. Pt did not receive dialysis today. Pt A&Ox4, denies pain/discomfort at this time.

## 2023-04-18 NOTE — Assessment & Plan Note (Signed)
No updated TSH in system Will repeat with weakness complaints

## 2023-04-18 NOTE — ED Notes (Signed)
**Note Pedro-Identified via Obfuscation** ED TO INPATIENT HANDOFF REPORT  ED Nurse Name and Phone #: Theophilus Bones [phone]  S Name/Age/Gender Pedro Young 89 y.o. male Room/Bed: 012C/012C  Code Status   Code Status: DNR  Home/SNF/Other Home Patient oriented to: self, place, time, and situation Is this baseline? Yes   Triage Complete: Triage complete  Chief Complaint Acute on chronic anemia [D64.9]  Triage Note Pt present to ED from dialysis with c/o BLE weakness, hypotension. Pt states BLE weakness onset yesterday and this morning prior to arriving for dialysis. Pt did not receive dialysis today. Pt A&Ox4, denies pain/discomfort at this time.   Allergies Allergies  Allergen Reactions   Amoxicillin-Pot Clavulanate Other (See Comments)    Did it involve swelling of the face/tongue/throat, SOB, or low BP? Unknown Did it involve sudden or severe rash/hives, skin peeling, or any reaction on the inside of your mouth or nose? Unknown Did you need to seek medical attention at a hospital or doctor's office? Unknown When did it last happen? patient is unfamiliar with this allergy   If all above answers are "NO", may proceed with cephalosporin use.    Atorvastatin     REACTION: arthralgia   Lovastatin     REACTION: arthralgia    Level of Care/Admitting Diagnosis ED Disposition     ED Disposition  Admit   Condition  --   Comment  Hospital Area: [facility] [[id number]]  Level of Care: Telemetry Medical [104]  May place patient in observation at [facility] or [facility] if equivalent level of care is available:: No  Covid Evaluation: Asymptomatic - no recent exposure (last 10 days) testing not required  Diagnosis: Acute on chronic anemia [[id number]]  Admitting Physician: Orland Mustard [[id number]]  Attending Physician: Alton Revere          B Medical/Surgery History Past Medical History:  Diagnosis Date   Cancer (HCC)    Hypertension    Renal disorder    CRF   Past Surgical  History:  Procedure Laterality Date   AV FISTULA PLACEMENT Left 05/04/2019   Procedure: Arteriovenous (Av) Fistula Creation Left Arm;  Surgeon: Larina Earthly, MD;  Location: [facility] OR;  Service: Vascular;  Laterality: Left;   BIOPSY  08/10/2019   Procedure: BIOPSY;  Surgeon: Lynann Bologna, MD;  Location: [facility] ENDOSCOPY;  Service: Endoscopy;;   CHOLECYSTECTOMY     COLONOSCOPY N/A 08/10/2019   Procedure: COLONOSCOPY;  Surgeon: Lynann Bologna, MD;  Location: [facility] ENDOSCOPY;  Service: Endoscopy;  Laterality: N/A;   EYE SURGERY Bilateral    cataract   LEFT HEART CATH AND CORONARY ANGIOGRAPHY N/A 06/19/2020   Procedure: LEFT HEART CATH AND CORONARY ANGIOGRAPHY;  Surgeon: Lyn Records, MD;  Location: [facility] INVASIVE CV LAB;  Service: Cardiovascular;  Laterality: N/A;   POLYPECTOMY  08/10/2019   Procedure: POLYPECTOMY;  Surgeon: Lynann Bologna, MD;  Location: [facility] ENDOSCOPY;  Service: Endoscopy;;     A IV Location/Drains/Wounds Patient Lines/Drains/Airways Status     Active Line/Drains/Airways     Name Placement date Placement time Site Days   Peripheral IV 04/18/23 18 G Right Antecubital 04/18/23  1431  Antecubital  less than 1   Fistula / Graft Left Forearm Arteriovenous fistula 05/04/19  1137  Forearm  1445            Intake/Output Last 24 hours No intake or output data in the 24 hours ending 04/18/23 1720  Labs/Imaging Results for orders placed or performed during the hospital encounter of 04/18/23 (from the  past 48 hour(s))  Comprehensive metabolic panel     Status: Abnormal   Collection Time: 04/18/23  2:12 PM  Result Value Ref Range   Sodium 136 135 - 145 mmol/L   Potassium 4.3 3.5 - 5.1 mmol/L   Chloride 96 (L) 98 - 111 mmol/L   CO2 30 22 - 32 mmol/L   Glucose, Bld 101 (H) 70 - 99 mg/dL    Comment: Glucose reference range applies only to samples taken after fasting for at least 8 hours.   BUN 56 (H) 8 - 23 mg/dL   Creatinine, Ser 1.61 (H) 0.61 - 1.24 mg/dL   Calcium 7.7 (L) 8.9 - 10.3  mg/dL   Total Protein 5.8 (L) 6.5 - 8.1 g/dL   Albumin 2.5 (L) 3.5 - 5.0 g/dL   AST 23 15 - 41 U/L   ALT 18 0 - 44 U/L   Alkaline Phosphatase 83 38 - 126 U/L   Total Bilirubin 1.0 0.3 - 1.2 mg/dL   GFR, Estimated 14 (L) >60 mL/min    Comment: (NOTE) Calculated using the CKD-EPI Creatinine Equation (2021)    Anion gap 10 5 - 15    Comment: Performed at [facility], [address], [place], [place] [address]  CBC with Differential     Status: Abnormal   Collection Time: 04/18/23  2:12 PM  Result Value Ref Range   WBC 6.0 4.0 - 10.5 K/uL   RBC 2.25 (L) 4.22 - 5.81 MIL/uL   Hemoglobin 7.3 (L) 13.0 - 17.0 g/dL   HCT 54.0 (L) 98.1 - 19.1 %   MCV 100.9 (H) 80.0 - 100.0 fL   MCH 32.4 26.0 - 34.0 pg   MCHC 32.2 30.0 - 36.0 g/dL   RDW 47.8 (H) 29.5 - 62.1 %   Platelets 175 150 - 400 K/uL   nRBC 0.0 0.0 - 0.2 %   Neutrophils Relative % 59 %   Neutro Abs 3.5 1.7 - 7.7 K/uL   Lymphocytes Relative 21 %   Lymphs Abs 1.3 0.7 - 4.0 K/uL   Monocytes Relative 15 %   Monocytes Absolute 0.9 0.1 - 1.0 K/uL   Eosinophils Relative 5 %   Eosinophils Absolute 0.3 0.0 - 0.5 K/uL   Basophils Relative 0 %   Basophils Absolute 0.0 0.0 - 0.1 K/uL   Immature Granulocytes 0 %   Abs Immature Granulocytes 0.02 0.00 - 0.07 K/uL    Comment: Performed at [facility], [address], [place], [place] [address]  Protime-INR     Status: None   Collection Time: 04/18/23  2:12 PM  Result Value Ref Range   Prothrombin Time 14.1 11.4 - 15.2 seconds   INR 1.1 0.8 - 1.2    Comment: (NOTE) INR goal varies based on device and disease states. Performed at [facility], [address], [place], [place] [address]   Lactic acid, plasma     Status: None   Collection Time: 04/18/23  2:12 PM  Result Value Ref Range   Lactic Acid, Venous 0.7 0.5 - 1.9 mmol/L    Comment: Performed at [facility], [address], [place], [place] [address]  CK     Status: None   Collection Time: 04/18/23  2:33  PM  Result Value Ref Range   Total CK 77 49 - 397 U/L    Comment: Performed at [facility], [address], [place], [place] [address]  Urinalysis, Routine w  reflex microscopic -Urine, Clean Catch     Status: Abnormal   Collection Time: 04/18/23  2:51 PM  Result Value Ref Range   Color, Urine YELLOW YELLOW   APPearance CLEAR CLEAR   Specific Gravity, Urine 1.015 1.005 - 1.030   pH 7.0 5.0 - 8.0   Glucose, UA NEGATIVE NEGATIVE mg/dL   Hgb urine dipstick NEGATIVE NEGATIVE   Bilirubin Urine SMALL (A) NEGATIVE   Ketones, ur NEGATIVE NEGATIVE mg/dL   Protein, ur 161 (A) NEGATIVE mg/dL   Nitrite NEGATIVE NEGATIVE   Leukocytes,Ua NEGATIVE NEGATIVE   RBC / HPF 0-5 0 - 5 RBC/hpf   WBC, UA 0-5 0 - 5 WBC/hpf   Bacteria, UA NONE SEEN NONE SEEN   Squamous Epithelial / HPF 0-5 0 - 5 /HPF   Mucus PRESENT     Comment: Performed at [facility], [address], [place], [place] [address]  Lactic acid, plasma     Status: None   Collection Time: 04/18/23  4:15 PM  Result Value Ref Range   Lactic Acid, Venous 0.9 0.5 - 1.9 mmol/L    Comment: Performed at [facility], [address], [place], [place] [address]  Type and screen     Status: None (Preliminary result)   Collection Time: 04/18/23  4:15 PM  Result Value Ref Range   ABO/RH(D) PENDING    Antibody Screen PENDING    Sample Expiration      04/21/2023,2359 Performed at [facility], [address] [place], [place] [address]   Prepare RBC (crossmatch)     Status: None   Collection Time: 04/18/23  4:49 PM  Result Value Ref Range   Order Confirmation      ORDERS RECEIVED TO CROSSMATCH Performed at [facility], [address], [place], [place] [address]    DG Chest Port 1 View  Result Date: 04/18/2023 CLINICAL DATA:  Weakness, concern for pneumonia. EXAM: PORTABLE CHEST 1 VIEW COMPARISON:  Chest radiograph dated 04/06/2023. FINDINGS: The heart size and mediastinal contours are within normal limits.  Vascular calcifications are seen in the aortic arch. Both lungs are clear. The visualized skeletal structures are unremarkable. IMPRESSION: No active cardiopulmonary disease. Electronically Signed   By: Romona Curls M.D.   On: 04/18/2023 15:29    Pending Labs Unresulted Labs (From admission, onward)     Start     Ordered   04/19/23 0500  Basic metabolic panel  Tomorrow morning,   R        04/18/23 1715   04/19/23 0500  CBC  Tomorrow morning,   R        04/18/23 1715   04/18/23 1656  Vitamin B12  Once,   R        04/18/23 1655   04/18/23 1656  Folate  Once,   R        04/18/23 1655   04/18/23 1646  TSH  Once,   URGENT        04/18/23 1645            Vitals/Pain Today's Vitals   04/18/23 1500 04/18/23 1515 04/18/23 1530 04/18/23 1545  BP: (!) 108/57 118/63 117/61 (!) 116/59  Pulse: 65 65 66 64  Resp: 18 19 18 15   Temp:      TempSrc:      SpO2: 97% 100% 98% 97%  Weight:      Height:      PainSc:  Isolation Precautions No active isolations  Medications Medications  0.9 %  sodium chloride infusion (Manually program via Guardrails IV Fluids) (has no administration in time range)  acetaminophen (TYLENOL) tablet 650 mg (has no administration in time range)    Or  acetaminophen (TYLENOL) suppository 650 mg (has no administration in time range)  ondansetron (ZOFRAN) tablet 4 mg (has no administration in time range)    Or  ondansetron (ZOFRAN) injection 4 mg (has no administration in time range)    Mobility walks     Focused Assessments Renal Assessment Handoff:  Hemodialysis Schedule: Hemodialysis Schedule: Tuesday/Thursday/Saturday Last Hemodialysis date and time: dialysis cancelled today d/t weakness, and hypotension   Restricted appendage: left arm   R Recommendations: See Admitting Provider Note  Report given to:   Additional Notes:

## 2023-04-18 NOTE — Assessment & Plan Note (Addendum)
Continue flomax  

## 2023-04-19 ENCOUNTER — Observation Stay (HOSPITAL_COMMUNITY): Payer: Medicare HMO

## 2023-04-19 ENCOUNTER — Observation Stay (HOSPITAL_COMMUNITY)
Admit: 2023-04-19 | Discharge: 2023-04-19 | Disposition: A | Discharge status: Home / Self Care | Payer: Medicare HMO | Attending: Internal Medicine | Admitting: Internal Medicine

## 2023-04-19 DIAGNOSIS — G253 Myoclonus: Secondary | ICD-10-CM | POA: Diagnosis present

## 2023-04-19 DIAGNOSIS — N186 End stage renal disease: Secondary | ICD-10-CM | POA: Diagnosis present

## 2023-04-19 DIAGNOSIS — R531 Weakness: Secondary | ICD-10-CM | POA: Diagnosis not present

## 2023-04-19 DIAGNOSIS — T46995A Adverse effect of other agents primarily affecting the cardiovascular system, initial encounter: Secondary | ICD-10-CM | POA: Diagnosis present

## 2023-04-19 DIAGNOSIS — Z8249 Family history of ischemic heart disease and other diseases of the circulatory system: Secondary | ICD-10-CM | POA: Diagnosis not present

## 2023-04-19 DIAGNOSIS — E038 Other specified hypothyroidism: Secondary | ICD-10-CM | POA: Diagnosis present

## 2023-04-19 DIAGNOSIS — D631 Anemia in chronic kidney disease: Secondary | ICD-10-CM | POA: Diagnosis present

## 2023-04-19 DIAGNOSIS — R443 Hallucinations, unspecified: Secondary | ICD-10-CM | POA: Diagnosis present

## 2023-04-19 DIAGNOSIS — M7989 Other specified soft tissue disorders: Secondary | ICD-10-CM | POA: Diagnosis present

## 2023-04-19 DIAGNOSIS — R54 Age-related physical debility: Secondary | ICD-10-CM | POA: Diagnosis present

## 2023-04-19 DIAGNOSIS — N400 Enlarged prostate without lower urinary tract symptoms: Secondary | ICD-10-CM | POA: Diagnosis present

## 2023-04-19 DIAGNOSIS — Z515 Encounter for palliative care: Secondary | ICD-10-CM | POA: Diagnosis not present

## 2023-04-19 DIAGNOSIS — I2510 Atherosclerotic heart disease of native coronary artery without angina pectoris: Secondary | ICD-10-CM | POA: Diagnosis present

## 2023-04-19 DIAGNOSIS — Z87891 Personal history of nicotine dependence: Secondary | ICD-10-CM | POA: Diagnosis not present

## 2023-04-19 DIAGNOSIS — Z66 Do not resuscitate: Secondary | ICD-10-CM | POA: Diagnosis present

## 2023-04-19 DIAGNOSIS — Z681 Body mass index (BMI) 19 or less, adult: Secondary | ICD-10-CM | POA: Diagnosis not present

## 2023-04-19 DIAGNOSIS — E861 Hypovolemia: Secondary | ICD-10-CM | POA: Diagnosis present

## 2023-04-19 DIAGNOSIS — I120 Hypertensive chronic kidney disease with stage 5 chronic kidney disease or end stage renal disease: Secondary | ICD-10-CM | POA: Diagnosis present

## 2023-04-19 DIAGNOSIS — N2581 Secondary hyperparathyroidism of renal origin: Secondary | ICD-10-CM | POA: Diagnosis present

## 2023-04-19 DIAGNOSIS — R569 Unspecified convulsions: Secondary | ICD-10-CM

## 2023-04-19 DIAGNOSIS — M898X9 Other specified disorders of bone, unspecified site: Secondary | ICD-10-CM | POA: Diagnosis present

## 2023-04-19 DIAGNOSIS — R64 Cachexia: Secondary | ICD-10-CM | POA: Diagnosis present

## 2023-04-19 DIAGNOSIS — Z992 Dependence on renal dialysis: Secondary | ICD-10-CM | POA: Diagnosis not present

## 2023-04-19 DIAGNOSIS — E872 Acidosis, unspecified: Secondary | ICD-10-CM | POA: Diagnosis present

## 2023-04-19 DIAGNOSIS — G928 Other toxic encephalopathy: Secondary | ICD-10-CM | POA: Diagnosis present

## 2023-04-19 DIAGNOSIS — E785 Hyperlipidemia, unspecified: Secondary | ICD-10-CM | POA: Diagnosis present

## 2023-04-19 DIAGNOSIS — I959 Hypotension, unspecified: Secondary | ICD-10-CM | POA: Diagnosis present

## 2023-04-19 DIAGNOSIS — N179 Acute kidney failure, unspecified: Secondary | ICD-10-CM | POA: Diagnosis present

## 2023-04-19 LAB — BASIC METABOLIC PANEL
Anion gap: 9 (ref 5–15)
BUN: 21 mg/dL (ref 8–23)
CO2: 27 mmol/L (ref 22–32)
Calcium: 7.5 mg/dL — ABNORMAL LOW (ref 8.9–10.3)
Chloride: 95 mmol/L — ABNORMAL LOW (ref 98–111)
Creatinine, Ser: 1.93 mg/dL — ABNORMAL HIGH (ref 0.61–1.24)
GFR, Estimated: 33 mL/min — ABNORMAL LOW (ref >60)
Glucose, Bld: 102 mg/dL — ABNORMAL HIGH (ref 70–99)
Potassium: 3.4 mmol/L — ABNORMAL LOW (ref 3.5–5.1)
Sodium: 131 mmol/L — ABNORMAL LOW (ref 135–145)

## 2023-04-19 LAB — GLUCOSE, CAPILLARY: Glucose-Capillary: 78 mg/dL (ref 70–99)

## 2023-04-19 LAB — CBC
HCT: 24.7 % — ABNORMAL LOW (ref 39.0–52.0)
Hemoglobin: 8.2 g/dL — ABNORMAL LOW (ref 13.0–17.0)
MCH: 32.9 pg (ref 26.0–34.0)
MCHC: 33.2 g/dL (ref 30.0–36.0)
MCV: 99.2 fL (ref 80.0–100.0)
Platelets: 147 10*3/uL — ABNORMAL LOW (ref 150–400)
RBC: 2.49 MIL/uL — ABNORMAL LOW (ref 4.22–5.81)
RDW: 15.2 % (ref 11.5–15.5)
WBC: 6.7 10*3/uL (ref 4.0–10.5)
nRBC: 0 % (ref 0.0–0.2)

## 2023-04-19 LAB — TYPE AND SCREEN: Unit division: 0

## 2023-04-19 LAB — LACTIC ACID, PLASMA: Lactic Acid, Venous: 0.9 mmol/L (ref 0.5–1.9)

## 2023-04-19 LAB — MRSA NEXT GEN BY PCR, NASAL: MRSA by PCR Next Gen: NOT DETECTED

## 2023-04-19 LAB — T4, FREE: Free T4: 1.06 ng/dL (ref 0.61–1.12)

## 2023-04-19 LAB — AMMONIA: Ammonia: 18 umol/L (ref 9–35)

## 2023-04-19 LAB — HIV ANTIBODY (ROUTINE TESTING W REFLEX): HIV Screen 4th Generation wRfx: NONREACTIVE

## 2023-04-19 LAB — FOLATE: Folate: 7.1 ng/mL (ref >5.9)

## 2023-04-19 LAB — BPAM RBC: Unit Type and Rh: 6200

## 2023-04-19 MED ORDER — LIDOCAINE 4 % EX CREA
TOPICAL_CREAM | Freq: Two times a day (BID) | CUTANEOUS | Status: DC
  Administered 2023-04-19: 1 via TOPICAL
  Filled 2023-04-19: qty 5

## 2023-04-19 MED ORDER — CAPSAICIN 0.025 % EX CREA
TOPICAL_CREAM | Freq: Two times a day (BID) | CUTANEOUS | Status: DC
  Filled 2023-04-19: qty 60

## 2023-04-19 MED ORDER — HALOPERIDOL LACTATE 5 MG/ML IJ SOLN
INTRAMUSCULAR | Status: AC
  Administered 2023-04-19: 2 mg via INTRAVENOUS
  Filled 2023-04-19: qty 1

## 2023-04-19 MED ORDER — SODIUM CHLORIDE 0.9 % IV SOLN: INTRAVENOUS | Status: DC

## 2023-04-19 MED ORDER — HALOPERIDOL LACTATE 5 MG/ML IJ SOLN: 5.0000 mg | Freq: Once | INTRAMUSCULAR | Status: DC | PRN

## 2023-04-19 MED ORDER — ORAL CARE MOUTH RINSE: 15.0000 mL | OROMUCOSAL | Status: DC | PRN

## 2023-04-19 MED ORDER — GABAPENTIN 100 MG PO CAPS
100.0000 mg | ORAL_CAPSULE | ORAL | Status: AC
  Administered 2023-04-19: 100 mg via ORAL
  Filled 2023-04-19: qty 1

## 2023-04-19 MED ORDER — HALOPERIDOL LACTATE 5 MG/ML IJ SOLN: 2.0000 mg | Freq: Four times a day (QID) | INTRAMUSCULAR | Status: DC | PRN

## 2023-04-19 NOTE — Progress Notes (Signed)
Received patient in bed to unit.  Alert and oriented.  Informed consent signed and in chart.   TX duration: 3 HOURS  Patient tolerated well.  Transported back to the room  Alert, without acute distress.  Hand-off given to patient's nurse.   Access used: AVF Access issues: 1ST ATTEMPT CANNULATION UNSUCCESSFUL; 2ND ATTEMPT COMPLETED WITHOUT ISSUE  Total UF removed: 0 Medication(s) given: TYLENOL 650MG  PO GIVEN FOR BLE PAIN WITH NO RESOLVE Post HD VS: T 98 BP 141/90 HR 97 Post HD weight: 49.2KG     04/19/23 0111  Vitals  Temp 98 F (36.7 C)  Temp Source Oral  BP (!) 141/90  MAP (mmHg) 100  BP Location Right Arm  BP Method Automatic  Patient Position (if appropriate) Lying  Pulse Rate 97  Pulse Rate Source Monitor  ECG Heart Rate 97  Resp 20  Oxygen Therapy  SpO2 100 %  O2 Device Room Air  During Treatment Monitoring  Intra-Hemodialysis Comments Tx completed;Tolerated well  Post Treatment  Dialyzer Clearance Lightly streaked  Duration of HD Treatment -hour(s) 3 hour(s)  Hemodialysis Intake (mL) 0 mL  Liters Processed 53.9  Fluid Removed (mL) 0 mL  Tolerated HD Treatment Yes  Post-Hemodialysis Comments Pt completed tx without issue. No distress noted, stable for transport.  AVG/AVF Arterial Site Held (minutes) 5 minutes  AVG/AVF Venous Site Held (minutes) 5 minutes  Fistula / Graft Left Forearm Arteriovenous fistula  Placement Date/Time: 05/04/19 1137   Placed prior to admission: No  Orientation: Left  Access Location: Forearm  Access Type: Arteriovenous fistula  Site Condition No complications  Fistula / Graft Assessment Present;Thrill;Bruit  Status Deaccessed  Drainage Description None

## 2023-04-19 NOTE — Progress Notes (Addendum)
Progress Note   Patient: Pedro Young VWU:[id number] DOB: 04/20/1933 DOA: 04/18/2023     0 DOS: the patient was seen and examined on 04/19/2023   Brief hospital course: 89yo with h/o HTN, HLD, CAD, depression, gout, and recent ESRD on TTS HD who presented on 6/15 with weakness, dizziness.  He was found to have acute anemia, Hgb 9 -> 7.3 and was transfused 1 unit PRBC. Developed delirium following admission.  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 admitted for this issue earlier this month, along with AMS, hallucinations -He was started on HD and symptoms improved over the next few days, thought to be related to uremia -Now back with similar symptoms despite ongoing HD, seems unlikely related to this issue -Head CT today with atrophy but no acute changes -Neurology consulted -EEG pending -He was given a dose of gabapentin overnight; while this is suboptimal it is unlikely to have caused the presenting symptoms -Palliative care consulted -If there is not a clearly reversible cause, family is likely to stop HD and transition to comfort care only  Acute on chronic anemia -Hgb down to 7.3 from baseline of 9.0 just one week ago at time of presentation -Repeat Hgb 8.2 today -known ACD from CKD>ESRD on dialysis. No active bleed -transfused one unit PRBC on admission -nephrology following  -aranesp per nephrology -trend cbc   ESRD on dialysis [facility]) -Recent transition from CKD stage V>ESRD after hospitalization on 6/3-6/7 -ESRD on TTS, did not have session today  -Nephrology consulted -Had dialysis yesterday but no fluid was drawn off due to concern for hypovolemia -Considering discontinuation of HD given lack of improvement since starting  HYPERTENSION, BENIGN ESSENTIAL -Blood pressure medication has not been adjusted after recent transition to ESRD and now having hypotensive episodes -Per nephrology: decrease metoprolol to 25mg  BID, stop norvasc and decrease imdur to  60mg   -Hold anti-Htn medication on dialysis days until after dialysis  -Will have parameters to hold anti-hypertensive for systolic <110  -Hold for now since hypotensive   Subclinical hypothyroidism -TSH is elevated, may be sick euthyroid -Not on Synthroid at this time -likely needs follow up TSH in 4-6 weeks pending clinical course  CAD (coronary artery disease) -Continue medical management   Hyperlipidemia -Continue rosuvastatin  BPH (benign prostatic hyperplasia) -Continue flomax       Subjective: Called by RN regarding patient with delirium.  Was heading to HD yesterday with pre-syncopal episode.  Went to HD last night, given a unit of blood.  A&O x 4 last night but with "severe jerking".  Similar symptoms prior to initiation of HD.  Assessed overnight, started on NS 50 cc/hr and given gabapentin x 1.  In the last 30 minutes, confused, altered, seeing fire in the room.  Pulled out IV, "scared."  Some slurring of speech.  RRT called, 150/70, glucose 78.  Jerking.  No known h/o cognitive impairment.    His daughter is at the bedside.  She reports that he was having these same symptoms prior to last admission and they improved after serial HD.  He started back with myoclonic jerks, hallucinations, agitation, slurred/confused speech.  If there is a reversible cause, they want to know it and otherwise want to feel that they have done all they can and will likely transition to comfort (his wife is in hospice at home now).  Per discussions with nephrology, it appears that they are likely to discontinue HD since he has not tolerated well and doesn't have any noticeable improvement from  it.   No head CT was done with last admission.   Physical Exam: Vitals:   04/19/23 0051 04/19/23 0111 04/19/23 0223 04/19/23 0724  BP: (!) 128/116 (!) 141/90 (!) 142/60 (!) 150/70  Pulse: 88 97 94 92  Resp: (!) 24 20 14  (!) 25  Temp:  98 F (36.7 C) 98.2 F (36.8 C) 98.1 F (36.7 C)  TempSrc:  Oral  Oral Oral  SpO2: (!) 89% 100% 98% 100%  Weight:      Height:       General:  +ballismus/myoclonic jerks, agitated despite low-dose Haldol Eyes:   EOMI, normal lids, iris ENT:  grossly normal hearing, lips & tongue, mmm Neck:  no LAD, masses or thyromegaly Cardiovascular:  RRR, no m/r/g. No LE edema.  Respiratory:   CTA bilaterally with no wheezes/rales/rhonchi.  Normal respiratory effort. Abdomen:  soft, NT, ND Skin:  no rash or induration seen on limited exam Musculoskeletal:  grossly normal tone BUE/BLE, good ROM, no bony abnormality Psychiatric:  confused mood and affect, speech mostly nonsensical, AOx1 Neurologic:  diffuse myoclonic jerks throughout evaluation   Radiological Exams on Admission: Independently reviewed - see discussion in A/P where applicable  EEG adult  Result Date: 04/19/2023 Charlsie Quest, MD     04/19/2023 12:56 PM Patient Name: Pedro Young MRN: [id number] Epilepsy Attending: Charlsie Quest Referring Physician/Provider: Elmer Picker, NP Date: 04/19/2023 Duration: 22.42 mins Patient history: 89yo M with seizure like activity getting eeg to evaluate for seizure. Level of alertness: Awake AEDs during EEG study: None Technical aspects: This EEG study was done with scalp electrodes positioned according to the 10-20 International system of electrode placement. Electrical activity was reviewed with band pass filter of 1-70Hz , sensitivity of 7 uV/mm, display speed of 30mm/sec with a 60Hz  notched filter applied as appropriate. EEG data were recorded continuously and digitally stored.  Video monitoring was available and reviewed as appropriate. Description: EEG showed continuous generalized 3 to 6 Hz theta-delta slowing. Patient was noted to have episodes of brief whole body jerking every few seconds.  Concomitant EEG before, during and after the event showed myogenic artifact consistent with myoclonus  Hyperventilation and photic stimulation were not performed.    ABNORMALITY - Continuous slow, generalized IMPRESSION: This study is suggestive of moderate diffuse encephalopathy, nonspecific etiology. No seizures or epileptiform discharges were seen throughout the recording. Patient was noted to have episodes of brief whole body jerking every few seconds without concomitant EEG change. The semiology of episodes was concerning for subcortical myoclonus. Priyanka Annabelle Harman   CT HEAD WO CONTRAST ( )  Result Date: 04/19/2023 CLINICAL DATA:  Altered mental status. EXAM: CT HEAD WITHOUT CONTRAST TECHNIQUE: Contiguous axial images were obtained from the base of the skull through the vertex without intravenous contrast. RADIATION DOSE REDUCTION: This exam was performed according to the departmental dose-optimization program which includes automated exposure control, adjustment of the mA and/or kV according to patient size and/or use of iterative reconstruction technique. COMPARISON:  None Available. FINDINGS: Brain: No evidence of intracranial hemorrhage, acute infarction, hydrocephalus, extra-axial collection, or mass lesion/mass effect. Moderate cerebral atrophy and chronic small vessel disease are noted. Vascular:  No hyperdense vessel or other acute findings. Skull: No evidence of fracture or other significant bone abnormality. Sinuses/Orbits:  No acute findings. Other: None. IMPRESSION: No acute intracranial abnormality. Moderate cerebral atrophy and chronic small vessel disease. Electronically Signed   By: Danae Orleans M.D.   On: 04/19/2023 10:33   DG Chest [facility]  Result Date: 04/18/2023 CLINICAL DATA:  Weakness, concern for pneumonia. EXAM: PORTABLE CHEST 1 VIEW COMPARISON:  Chest radiograph dated 04/06/2023. FINDINGS: The heart size and mediastinal contours are within normal limits. Vascular calcifications are seen in the aortic arch. Both lungs are clear. The visualized skeletal structures are unremarkable. IMPRESSION: No active cardiopulmonary disease.  Electronically Signed   By: Romona Curls M.D.   On: 04/18/2023 15:29    EKG: Independently reviewed.  NSR with rate 65; no evidence of acute ischemia   Labs on Admission: I have personally reviewed the available labs and imaging studies at the time of the admission.  Pertinent labs:    Na++ 131 K+ 3.4 Glucose 102 BUN 21/Creatinine 1.93/GFR 33 WBC 6.7 Hgb 8.2 TSH 10.749  Family Communication: Daughter was present throughout evaluation  Disposition: Status is: Inpatient  Admit - It is my clinical opinion that admission to INPATIENT is reasonable and necessary because of the expectation that this patient will require hospital care that crosses at least 2 midnights to treat this condition based on the medical complexity of the problems presented.  Given the aforementioned information, the predictability of an adverse outcome is felt to be significant.   Planned Discharge Destination:  to be determined    Time spent: 50 minutes  Author: Jonah Blue, MD 04/19/2023 1:54 PM  For on call review [email or web address].

## 2023-04-19 NOTE — Consult Note (Signed)
Neurology Consultation    Reason for Consult:Myoclonic jerks   CC: weakness, dizziness with hypotension  HISTORY OF PRESENT ILLNESS   HPI   Pedro Young is a 89 y.o. 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 He got to dialysis on Saturday and became dizzy and weak when exiting the car. His blood pressure was systolic 80s and he was sent to the ED. His daughter tells me that on arrival to the ER he was feeling better and was alert and oriented x 4 which is his baseline.  He received 1 unit of PRBCs and had a dialysis session last night.  During dialysis he received 650 mg of Tylenol for bilateral lower extremity pain.  He returned from dialysis at 0210 and had whole body jerking but was alert and oriented x 4 and still answering questions appropriately.   He received gabapentin 100mg  at 0345 on 6/16. Around 7 AM he began having hallucinations with forced jerking and slurred speech. At 0745 a RR was called due to myoclonus in all extremities and increased confusion and hallucinations. It was reported that he was Ax01, believed his room was on fire. Given 2mg  of haldol.  A head CT was completed that shows no acute intracranial abnormality, moderate cerebral atrophy and chronic small vessel disease. He was admitted from 04/06/2023- 04/10/2023 after he was found on the floor and found to have an AKI and metabolic acidosis. He did have involuntary jerking that resolved after dialysis was initiated 6/5, generalized weakness, and unsteadiness during that admission.  Was driving until last hospitalization in June. Family reports he did get lost once driving (8 mo ago, going to a familiar place) and he is no long driving since the last hospitalization. His daughter took over his finances last year when they noticed he was starting to miss bills. His wife broke her hip last year  and is currently on hospice. Family states that is about the time that they noticed he started missing bills. He follows with Dr. Marisue Humble outpatient for nephrology. He has had gradually progressive weight loss.   Weight this admission 45.4 kg  Weight  02/21/2022 51.8 kg Weight  11/11/2021 52.4 kg  History is obtained from:Patient, family and chart review   ROS: Review of systems not obtained due to patient factors.   PAST MEDICAL HISTORY    Past Medical History:  Past Medical History:  Diagnosis Date   Cancer (HCC)    ESRD on hemodialysis (HCC)    HD at [facility] on TTS schedule, started June 2024   Hypertension     No family history on file. Family History  Problem Relation Age of Onset   Liver disease Father    Kidney disease Father    Heart disease Father        before age 35   Heart disease Mother        before age 22    Allergies:  Allergies  Allergen Reactions   Amoxicillin-Pot Clavulanate Other (See Comments)    Did it involve swelling of the face/tongue/throat, SOB, or low BP? Unknown Did it involve sudden or severe rash/hives, skin peeling, or any reaction on the inside of your mouth or nose? Unknown Did you need to seek medical attention at a hospital or doctor's office? Unknown When did it last happen? patient is unfamiliar with this allergy  If all above answers are "NO", may proceed with cephalosporin use.    Atorvastatin     REACTION: arthralgia   Lovastatin     REACTION: arthralgia    Social History:   reports that he quit smoking about 68 years ago. His smoking use included cigarettes. He quit smokeless tobacco use about 68 years ago. He reports that he does not drink alcohol and does not use drugs.    Medications Medications Prior to Admission  Medication Sig Dispense Refill   benzonatate (TESSALON) 100 MG capsule Take 100 mg by mouth 3 (three) times daily as needed for cough.     colchicine 0.6 MG tablet Take 0.6 mg by mouth 2 (two) times daily.      diclofenac Sodium (VOLTAREN) 1 % GEL Apply 2 g topically 4 (four) times daily as needed (joint pain).     furosemide (LASIX) 40 MG tablet Take 40 mg by mouth daily. Prescribed by Dr Eliott [facility]) since May 2016     metoprolol tartrate (LOPRESSOR) 50 MG tablet Take 50 mg by mouth 2 (two) times daily.     ranolazine (RANEXA) 500 MG 12 hr tablet Take 500 mg by mouth 2 (two) times daily.     rosuvastatin (CRESTOR) 40 MG tablet TAKE 1 TABLET BY MOUTH EVERY DAY (Patient taking differently: Take 40 mg by mouth daily.) 90 tablet 3   tamsulosin (FLOMAX) 0.4 MG CAPS capsule Take 0.4 mg by mouth daily.     Darbepoetin Alfa (ARANESP) 60 MCG/0.3ML SOSY injection Inject 0.3 mLs (60 mcg total) into the skin every 7 (seven) days. (Patient not taking: Reported on 04/18/2023) 4.2 mL    nitroGLYCERIN (NITROSTAT) 0.4 MG SL tablet PLACE 1 TABLET UNDER THE TONGUE EVERY 5 MINUTES AS NEEDED FOR CHEST PAIN (Patient taking differently: Place 0.4 mg under the tongue every 5 (five) minutes as needed for chest pain.) 45 tablet 3    EXAMINATION    Current vital signs:    04/19/2023    7:24 AM 04/19/2023    2:23 AM 04/19/2023    1:11 AM  Vitals with BMI  Systolic 150 142 696  Diastolic 70 60 90  Pulse 92 94 97    Examination:  GENERAL: Awake, alert in NAD HEENT: - Normocephalic and atraumatic, dry mm, no lymphadenopathy, no Thyromegally LUNGS - Regular, unlabored respirations CV - S1S2 RRR, equal pulses bilaterally. ABDOMEN - Soft, nontender, nondistended with normoactive BS Ext: warm, well perfused, intact peripheral pulses, no pedal edema Integumentary:  Skin intact on clothed exam  NEURO:  Mental Status: Does not state name or answer questions. Intermittently opens eyes to commands but does not follow simple commands with extremities or tongue Language: speech output is minimal and slurred on initial NP exam  Physical Exam  Constitutional: Cachectic, chronically ill-appearing Psych: Affect  pleasantly confused, interactive Eyes: No scleral injection HENT: No OP obstruction MSK: no major joint deformities.  Cardiovascular: Perfusing extremities well Respiratory: Effort normal, non-labored breathing GI: Soft.  No distension. There is no tenderness.  Skin: WDI  Neuro: Mental Status: Patient is awake, alert, speech is dysarthric and difficult to understand, poor attention/concentration, does appear to be having some slight hallucinations for example asking about avocado.  Responds more to [place] than [place]. Cranial Nerves: II: Visual Fields are full.  III,IV, VI: EOMI without ptosis or diploplia.  Saccadic pursuits versus difficulty attending to smooth perceived task V: Facial sensation is symmetric to temperature VII: Facial movement is symmetric.  VIII: hearing is  intact to voice X: Uvula elevates symmetrically XI: Shoulder shrug is symmetric. XII: tongue is midline Motor: Diffusely reduced bulk.  Frequent myoclonic jerking involving all 4 extremities as well as his face at times.  That said on confrontational strength testing, at least 4+/5 throughout with intermittent loss of tone due to his myoclonus Sensory: Sensation is symmetric to light touch and temperature in the arms and legs. Deep Tendon Reflexes: Difficult to assess in the setting of frequent myoclonus, normal to increased Cerebellar: FNF and HKS are intact bilaterally within limits of his myoclonus   LABS   I have reviewed labs in epic and the results pertinent to this consultation are:   Basic Metabolic Panel: Recent Labs  Lab 04/18/23 1412 04/18/23 1756 04/19/23 0416  NA 136  --  131*  K 4.3  --  3.4*  CL 96*  --  95*  CO2 30  --  27  GLUCOSE 101*  --  102*  BUN 56*  --  21  CREATININE 4.04*  --  1.93*  CALCIUM 7.7*  --  7.5*  PHOS  --  3.1  --     CBC: Recent Labs  Lab 04/18/23 1412 04/19/23 0416  WBC 6.0 6.7  NEUTROABS 3.5  --   HGB 7.3* 8.2*  HCT 22.7* 24.7*  MCV 100.9*  99.2  PLT 175 147*    Coagulation Studies: Recent Labs    04/18/23 1412  LABPROT 14.1  INR 1.1      Lab Results  Component Value Date   LDLCALC 53 11/18/2022   Lab Results  Component Value Date   ALT 18 04/18/2023   AST 23 04/18/2023   ALKPHOS 83 04/18/2023   BILITOT 1.0 04/18/2023   Lab Results  Component Value Date   HGBA1C  01/23/2010    5.9 (NOTE) The ADA recommends the following therapeutic goal for glycemic control related to Hgb A1c measurement: Goal of therapy: <6.5 Hgb A1c  Reference: American Diabetes Association: Clinical Practice Recommendations 2010, Diabetes Care, 2010, 33: (Suppl  1).   Lab Results  Component Value Date   WBC 6.7 04/19/2023   HGB 8.2 (L) 04/19/2023   HCT 24.7 (L) 04/19/2023   MCV 99.2 04/19/2023   PLT 147 (L) 04/19/2023   Lab Results  Component Value Date   VITAMINB12 556 04/18/2023   Lab Results  Component Value Date   FOLATE 7.1 04/19/2023   Lab Results  Component Value Date   NA 131 (L) 04/19/2023   K 3.4 (L) 04/19/2023   CL 95 (L) 04/19/2023   CO2 27 04/19/2023   Lab Results  Component Value Date   TSH 10.749 (H) 04/18/2023     DIAGNOSTIC IMAGING/PROCEDURES   I have reviewed the images obtained:, as below    CT-head-no acute intracranial abnormality, moderate cerebral atrophy and chronic small vessel disease. personally reviewed, agree with radiology read above   EEG - Continuous slow, generalized   ASSESSMENT/PLAN    Assessment:  89 y.o. 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 He had an episode of acute confusion with hallucinations on the morning of 6/16 as well as full body jerking. Of note he did receive gabapentin overnight, he does not take this at home.   Myoclonus can be seen both in the setting of acute renal failure as well as part of a postdialysis  syndrome and both of these etiologies may be playing a role in this patient.  Additionally would avoid gabapentin as this can certainly contribute.  Myoclonus can also be seen in neurodegenerative disease, which his history does suggest he has had a gradually progressive cognitive decline concerning for an underlying dementia.  If his movements are not improving with supportive care may consider further imaging but at this time will monitor clinically and assess for reversible causes of dementia.   Impression: Myoclonus  Toxic/metabolic encephalopathy Likely underlying dementia  Recommendations: - Continue dialysis as ordered by nephrology - Lidocaine gel followed by capsacin gel for likely neuropathic pain, ordered BID for now, avoid gabapentin - B1 level, Free T4 given elevated TSH, ammonia, HIV, RPR - Expect movements to gradually resolve with time/dialysis  - Neurology will follow along  Attending Neurologist's note:  I personally saw this patient, gathering history, performing a full neurologic examination, reviewing relevant labs, personally reviewing relevant imaging including head CT, and formulated the assessment and plan, adding the note above for completeness and clarity to accurately reflect my thoughts   Brooke Dare MD-PhD [facility] [phone] Available 7 AM to 7 PM, outside these hours please contact Neurologist on call listed on AMION

## 2023-04-19 NOTE — Progress Notes (Signed)
Patient came to floor from hemodialysis at approximately 0210.  Upon return, daughter is at bedside in the room.  Patient is A & O x4 and answering questions appropriately.  He is doing whole body jerking and trembling.  It is hard for him to hold still.  Eventually had to do manual B/P.  VS stable.  He is also c/o severe generalized cramping with worse pain in his right knee.  Tylenol given in HD with minimal relief.  He was kept even in HD.  Dr. Margo Aye made aware and came to bedside to assess patient.  Order received for NS @ 50 cc and 100 mg PO gabapentin.  Orders implemented.  At approximately 0700, called to patient's room.  He has pulled his PIV out and taken all his telemetry leads off.  He is scared and seeing fire sparking in his room.  Per his daughter, he is having hallucinations.  He has never had them in the past.  His jerking is worse.  His speech is slurred and has some difficulty finding words.  He thinks he is at his daughter's house.  Rapid Response RN called to evaluate patient and at bedside.  Dr. Ophelia Charter paged and updated on patient's condition.  New orders entered and implemented.  Renal MD at bedside.  Will continue to monitor patient.  Bernie Covey RN

## 2023-04-19 NOTE — Progress Notes (Signed)
EEG complete - results pending 

## 2023-04-19 NOTE — Progress Notes (Signed)
Patient c/o right ear pain. No obvious abnormalities.  Family requesting physician notification.  Dr. Margo Aye notified.

## 2023-04-19 NOTE — Significant Event (Signed)
Rapid Response Event Note   Reason for Call :  Confusion, "shaking"  Initial Focused Assessment:  Patient in bed with myoclonus in all extremities. Patient alert x1 and answering in both [place] and Spanish which according to daughter at bedside is a change this AM. He is also needing some questions/directions translated into Spanish to understand. Skin warm/dry. Lungs clear. Heart tones fast. Patient appears delirious.   150/70 (94) HR 115 RR 25 O2 100% RA CBG 78  Interventions:  MD paged, nephro to bedside  PIV placed Haldol IV 2mg    Plan of Care:  Allow PO gabapentin to exit system, rest. Consider CXR before continued hydration to prevent fluid overload. Call back for further needs.   Event Summary:  MD Notified: Basil Dess MD, Lamar Laundry MD Call Time: [phone] Arrival Time: 76 End Time: 38  Truddie Crumble, RN

## 2023-04-19 NOTE — Procedures (Signed)
Patient Name: Pedro Young  MRN: [id number]  Epilepsy Attending: Charlsie Quest  Referring Physician/Provider: Elmer Picker, NP  Date: 04/19/2023 Duration: 22.42 mins  Patient history: 89yo M with seizure like activity getting eeg to evaluate for seizure.  Level of alertness: Awake  AEDs during EEG study: None  Technical aspects: This EEG study was done with scalp electrodes positioned according to the 10-20 International system of electrode placement. Electrical activity was reviewed with band pass filter of 1-70Hz , sensitivity of 7 uV/mm, display speed of 48mm/sec with a 60Hz  notched filter applied as appropriate. EEG data were recorded continuously and digitally stored.  Video monitoring was available and reviewed as appropriate.  Description: EEG showed continuous generalized 3 to 6 Hz theta-delta slowing.  Patient was noted to have episodes of brief whole body jerking every few seconds.  Concomitant EEG before, during and after the event showed myogenic artifact consistent with myoclonus   Hyperventilation and photic stimulation were not performed.     ABNORMALITY - Continuous slow, generalized  IMPRESSION: This study is suggestive of moderate diffuse encephalopathy, nonspecific etiology. No seizures or epileptiform discharges were seen throughout the recording.  Patient was noted to have episodes of brief whole body jerking every few seconds without concomitant EEG change. The semiology of episodes was concerning for subcortical myoclonus.   Shila Kruczek Annabelle Harman

## 2023-04-19 NOTE — Assessment & Plan Note (Addendum)
-  Patient was previously admitted for this issue earlier this month, along with AMS, hallucinations -He was started on HD and symptoms improved over the next few days, thought to be related to uremia -Now back with similar symptoms despite ongoing HD, seems unlikely related to this issue -Head CT with atrophy but no acute changes -Neurology consulted -EEG with diffuse generalized slowing -He was given a dose of gabapentin x 1, should be avoided in the future but did not cause myoclonus -Ranexa has a rare side effect of neurologic toxicity in the setting of renal failure and may be causing symptoms; Ranexa held and symptoms resolved -MRI unremarkable -Palliative care consulted -based on ongoing improvement, patient appears to be appropriate for discharge to home at this time

## 2023-04-19 NOTE — Hospital Course (Signed)
89yo with h/o HTN, HLD, CAD, depression, gout, and recent ESRD on TTS HD who presented on 6/15 with weakness, dizziness.  He was found to have acute anemia, Hgb 9 -> 7.3 and was transfused 1 unit PRBC. Developed delirium following admission.

## 2023-04-19 NOTE — Progress Notes (Signed)
[facility] Progress Note   Subjective:  Seen in room - daughter at bedside. Ongoing chronic body myoclonic jerking and AMS. Started after HD last night around 2am, was having the jerking and hallucinations. On call MD started IVF and given 1 dose gabapentin overnight - symptoms persisted this AM. Gabapentin not the best choice as it can cause this issue as well in ESRD patients, although seems like the symptoms preceded the gabapentin - however, would AVOID in the future. He knows his name, but thinks he is in [place]. His hospitalist ordered stat head CT - per my read, ok except brain atrophy. ?seizures. Neuro and palliative have been consulted. Per discussion with Dr. Ophelia Charter - his wife is in hospice at home, daughter thinks they would like to die together. Of note, daughter reports these symptoms when he was initially started on HD few weeks ago 6/3 - BUN was high at that time, felt uremic - symptoms did improve. However, THIS time - BUN is low, fully dialyzed yesterday - cannot be attributed to uremia.  Objective Vitals:   04/19/23 0051 04/19/23 0111 04/19/23 0223 04/19/23 0724  BP: (!) 128/116 (!) 141/90 (!) 142/60 (!) 150/70  Pulse: 88 97 94 92  Resp: (!) 24 20 14  (!) 25  Temp:  98 F (36.7 C) 98.2 F (36.8 C) 98.1 F (36.7 C)  TempSrc:  Oral Oral Oral  SpO2: (!) 89% 100% 98% 100%  Weight:      Height:       Physical Exam General: Frail man, generalized persistent myoclonic jerking - cannot keep still. Oriented x 1 only. Heart: RRR; no murmur Lungs: CTA anteriorly Abdomen: soft Extremities: no LE edema Dialysis Access:  L forearm AVF  Additional Objective Labs: Basic Metabolic Panel: Recent Labs  Lab 04/18/23 1412 04/18/23 1756 04/19/23 0416  NA 136  --  131*  K 4.3  --  3.4*  CL 96*  --  95*  CO2 30  --  27  GLUCOSE 101*  --  102*  BUN 56*  --  21  CREATININE 4.04*  --  1.93*  CALCIUM 7.7*  --  7.5*  PHOS  --  3.1  --    Liver Function  Tests: Recent Labs  Lab 04/18/23 1412  AST 23  ALT 18  ALKPHOS 83  BILITOT 1.0  PROT 5.8*  ALBUMIN 2.5*   CBC: Recent Labs  Lab 04/18/23 1412 04/19/23 0416  WBC 6.0 6.7  NEUTROABS 3.5  --   HGB 7.3* 8.2*  HCT 22.7* 24.7*  MCV 100.9* 99.2  PLT 175 147*   Studies/Results: CT HEAD WO CONTRAST ( )  Result Date: 04/19/2023 CLINICAL DATA:  Altered mental status. EXAM: CT HEAD WITHOUT CONTRAST TECHNIQUE: Contiguous axial images were obtained from the base of the skull through the vertex without intravenous contrast. RADIATION DOSE REDUCTION: This exam was performed according to the departmental dose-optimization program which includes automated exposure control, adjustment of the mA and/or kV according to patient size and/or use of iterative reconstruction technique. COMPARISON:  None Available. FINDINGS: Brain: No evidence of intracranial hemorrhage, acute infarction, hydrocephalus, extra-axial collection, or mass lesion/mass effect. Moderate cerebral atrophy and chronic small vessel disease are noted. Vascular:  No hyperdense vessel or other acute findings. Skull: No evidence of fracture or other significant bone abnormality. Sinuses/Orbits:  No acute findings. Other: None. IMPRESSION: No acute intracranial abnormality. Moderate cerebral atrophy and chronic small vessel disease. Electronically Signed   By: Dietrich Pates.D.  On: 04/19/2023 10:33   DG Chest Port 1 View  Result Date: 04/18/2023 CLINICAL DATA:  Weakness, concern for pneumonia. EXAM: PORTABLE CHEST 1 VIEW COMPARISON:  Chest radiograph dated 04/06/2023. FINDINGS: The heart size and mediastinal contours are within normal limits. Vascular calcifications are seen in the aortic arch. Both lungs are clear. The visualized skeletal structures are unremarkable. IMPRESSION: No active cardiopulmonary disease. Electronically Signed   By: Romona Curls M.D.   On: 04/18/2023 15:29    Medications:  sodium chloride Stopped (04/19/23 0657)     ranolazine  500 mg Oral BID   rosuvastatin  40 mg Oral Daily   tamsulosin  0.4 mg Oral Daily    Dialysis Orders: SW TTS  3.5h  350/400   46.6kg   3K/2Ca bath   AVF  Heparin none - last HD 6/13, post wt 47.9kg - not on vdra or esa   Assessment/ Plan: Weakness/hypotension: On admit, HTN regimen reduced and was given IVF + 1U PRBCs. Myoclonic jerking: Started after HD last night - similar to prior presentation when HD was started - presumed uremic at that time, not the case now. Was given 1 dose gabapentin overnight - not the best choice for ESRD patient b/c caan confound issue and cause similar symptoms, but in is case - symtpoms preceded the med. Head CT without clear issue - chronic atrophy. Neuro and Palliative have been consulted by PMD. ESRD: Dialyzed overnight with no UF.  Got more IVF overnight. See GOC below. Next HD technically on 6/18 if symptoms improve. Acute on chronic anemia: S/p 1U PRBCs overnight. Hgb 8.2 this AM. Hypotension: Volume and BP down - all BP meds on hold (amlod, metop, isosorb).  Secondary HPTH: CorrCa/Phos ok. CAD: On ranexa for presumed anginal symptoms - would hold HL: On high dose statin - would hold GOC: HD started 6/3 - already having many issues. He is so frail. Doesn't seem to be benefiting him much. Unclear cause of the myoclonic jerking. Discussed with daughter that we may need to pursue hospice for him as well - wife currently in hospice at the family home - sad situation overall.  [name], PA-C 04/19/2023, 10:43 AM  [employer]

## 2023-04-19 NOTE — Progress Notes (Signed)
Received patient in bed to unit.  Alert and oriented.  Informed consent signed and in chart.   TX duration:  Patient tolerated well.  Transported back to the room  Alert, without acute distress.  Hand-off given to patient's nurse.   Access used: AVF Access issues: FIRST CANNULATION ATTEMPT UNSUCCESSFUL; 2ND ATTEMPT COMPLETED WITHOUT ISSUE.  Total UF removed: 0 Medication(s) given: TYLENOL 650MG  PO GIVEN FOR BLE WITH NO RESOLVE Post HD VS: T 98 BP 141/90 HR 97 Post HD weight: 49.2KG   [facility]

## 2023-04-20 DIAGNOSIS — G253 Myoclonus: Secondary | ICD-10-CM

## 2023-04-20 DIAGNOSIS — N186 End stage renal disease: Secondary | ICD-10-CM

## 2023-04-20 DIAGNOSIS — Z992 Dependence on renal dialysis: Secondary | ICD-10-CM

## 2023-04-20 DIAGNOSIS — Z515 Encounter for palliative care: Secondary | ICD-10-CM

## 2023-04-20 DIAGNOSIS — R531 Weakness: Secondary | ICD-10-CM

## 2023-04-20 LAB — BASIC METABOLIC PANEL
Anion gap: 12 (ref 5–15)
BUN: 35 mg/dL — ABNORMAL HIGH (ref 8–23)
CO2: 27 mmol/L (ref 22–32)
Calcium: 8.4 mg/dL — ABNORMAL LOW (ref 8.9–10.3)
Chloride: 96 mmol/L — ABNORMAL LOW (ref 98–111)
Creatinine, Ser: 3 mg/dL — ABNORMAL HIGH (ref 0.61–1.24)
GFR, Estimated: 19 mL/min — ABNORMAL LOW (ref >60)
Glucose, Bld: 88 mg/dL (ref 70–99)
Potassium: 4.3 mmol/L (ref 3.5–5.1)
Sodium: 135 mmol/L (ref 135–145)

## 2023-04-20 LAB — PTH, INTACT AND CALCIUM: PTH: 102 pg/mL — ABNORMAL HIGH (ref 15–65)

## 2023-04-20 LAB — CBC WITH DIFFERENTIAL/PLATELET
Abs Immature Granulocytes: 0.04 10*3/uL (ref 0.00–0.07)
Basophils Absolute: 0 10*3/uL (ref 0.0–0.1)
Basophils Relative: 1 %
Eosinophils Absolute: 0.2 10*3/uL (ref 0.0–0.5)
Eosinophils Relative: 3 %
HCT: 28.1 % — ABNORMAL LOW (ref 39.0–52.0)
Hemoglobin: 9.2 g/dL — ABNORMAL LOW (ref 13.0–17.0)
Immature Granulocytes: 1 %
Lymphocytes Relative: 16 %
Lymphs Abs: 1.4 10*3/uL (ref 0.7–4.0)
MCH: 32.9 pg (ref 26.0–34.0)
MCHC: 32.7 g/dL (ref 30.0–36.0)
MCV: 100.4 fL — ABNORMAL HIGH (ref 80.0–100.0)
Monocytes Absolute: 1 10*3/uL (ref 0.1–1.0)
Monocytes Relative: 12 %
Neutro Abs: 6 10*3/uL (ref 1.7–7.7)
Neutrophils Relative %: 67 %
Platelets: 177 10*3/uL (ref 150–400)
RBC: 2.8 MIL/uL — ABNORMAL LOW (ref 4.22–5.81)
RDW: 15.2 % (ref 11.5–15.5)
WBC: 8.7 10*3/uL (ref 4.0–10.5)
nRBC: 0 % (ref 0.0–0.2)

## 2023-04-20 LAB — RPR: RPR Ser Ql: NONREACTIVE

## 2023-04-20 MED ORDER — CHLORHEXIDINE GLUCONATE CLOTH 2 % EX PADS: 6.0000 | MEDICATED_PAD | Freq: Every day | CUTANEOUS | Status: DC

## 2023-04-20 NOTE — TOC CM/SW Note (Signed)
Transition of Care [facility]) - Inpatient Brief Assessment   Patient Details  Name: Pedro Young MRN: [id number] Date of Birth: 1933-07-14  Transition of Care [facility]) CM/SW Contact:    Tom-Johnson, Hershal Coria, RN Phone Number: 04/20/2023, 2:40 PM   Clinical Narrative:  Patient admitted with Weakness, Dizziness and Hypotension. Found to Myoclonia after Dialysis session on Saturday. Neurology and Nephrology following.  From home with wife and daughter. Wife is under Hospice care at home.   No TOC needs or recommendations noted at this time. CM will continue to follow as patient progresses with care towards discharge.      Transition of Care Asessment: Insurance and Status: Insurance coverage has been reviewed Patient has primary care physician: Yes Home environment has been reviewed: Yes Prior level of function:: Assistance Prior/Current Home Services: No current home services Social Determinants of Health Reivew: SDOH reviewed no interventions necessary Readmission risk has been reviewed: Yes Transition of care needs: no transition of care needs at this time

## 2023-04-20 NOTE — Consult Note (Signed)
Consultation Note Date: 04/20/2023   Patient Name: Pedro Young  DOB: 1933-02-22  MRN: [id number]  Age / Sex: 89 y.o., male  PCP: Maudie Flakes, FNP Referring Physician: Jonah Blue, MD  Reason for Consultation: Establishing goals of care and Psychosocial/spiritual support  HPI/Patient Profile: 89 y.o. male   admitted on 04/18/2023 with past medical history significant of HTN, HLD, CAD, depression, gout and recent ESRD on dialysis TTS who presented to ED with complaints of weakness and dizziness.   Recent admittion 6/3-6/7 for acute on chronic renal failure stage V. HD started in hospital via left upper extremity AV fistula.    Neurology consulted secondary to myoclonic jerking on all 4 extremities.  Patient and family face treatment option decisions, advanced directive decisions and anticipatory care needs.   Clinical Assessment and Goals of Care:  This NP Lorinda Creed reviewed medical records, received report from team, assessed the patient and then meet at the patient's bedside along with his  daughter/[name] at bedside,   and daughter/ Shanda Bumps and Nidia on conference call  to discuss diagnosis, prognosis, GOC, EOL wishes disposition and options.   Concept of Palliative Care was introduced as specialized medical care for people and their families living with serious illness.  If focuses on providing relief from the symptoms and stress of a serious illness.  The goal is to improve quality of life for both the patient and the family.   Values and goals of care important to patient and family were attempted to be elicited.  Created space and opportunity for patient  and family to explore thoughts and feelings regarding current medical situation.  Patient and his wife of 79 some years are originally from [place].  They have 5 daughters.  Patient's wife is at home under hospice services.  Daughter/  Sidney Ace is the main caregiver.  Up until the past few months patient was fully functional, living independently.  The past month has had dramatic physical, functional and cognitive decline for Mr. Bouse.   Patient has expressed in the past that he was not interested in aggressive medical interventions to prolong life however according to his daughters he is feeling that he needs to continue pushing forward, understanding that his wife has a limited prognosis.  A  discussion was had today regarding advanced directives.  Concepts specific to code status, artifical feeding and hydration, continued IV antibiotics and rehospitalization was had.    The difference between a aggressive medical intervention path  and a palliative comfort care path for this patient at this time was had.    We explored best case scenario vs worst case scenario.   We explored the concept specific to adult failure to thrive.  For now patient and his family are open to all offered and available medical interventions to prolong life.      They are looking to treat the treatable and see improvements over the next days here in the hospital. Continue with dialysis, treat current tremor, disposition back home with home physical therapy.  Stressed the importance of ongoing conversation with patient, family, medical providers in order to enhance patient centered care and decisions.      Questions and concerns addressed. Family are encouraged to call with questions or concerns.     PMT will continue to support holistically.     Patient has documented H POA naming [place] and [place] has healthcare agents      SUMMARY OF RECOMMENDATIONS    Code Status/Advance Care Planning: DNR No artificial feeding now or in the future    Palliative Prophylaxis:  Aspiration, Delirium Protocol, Frequent Pain Assessment, and Oral Care  Additional Recommendations (Limitations, Scope, Preferences): Avoid Hospitalization  and No Artificial Feeding  Psycho-social/Spiritual:  Desire for further Chaplaincy support:no Additional Recommendations: Education on Hospice, family is well aware of hospice services having them in place for the patient's wife at home currently.  Prognosis:  Unable to determine  Discharge Planning: To Be Determined      Primary Diagnoses: Present on Admission:  HYPERTENSION, BENIGN ESSENTIAL  (Resolved) Gout  CAD (coronary artery disease)  Subclinical hypothyroidism  (Resolved) MDD (major depressive disorder)  Hyperlipidemia  Acute on chronic anemia  Myoclonia   I have reviewed the medical record, interviewed the patient and family, and examined the patient. The following aspects are pertinent.  Past Medical History:  Diagnosis Date   Cancer (HCC)    ESRD on hemodialysis (HCC)    HD at [facility] on TTS schedule, started June 2024   Hypertension    Social History   Socioeconomic History   Marital status: Married    Spouse name: Not on file   Number of children: Not on file   Years of education: Not on file   Highest education level: Not on file  Occupational History   Not on file  Tobacco Use   Smoking status: Former    Types: Cigarettes    Quit date: 1956    Years since quitting: 68.5   Smokeless tobacco: Former    Quit date: 11/03/1954  Vaping Use   Vaping Use: Never used  Substance and Sexual Activity   Alcohol use: No    Alcohol/week: 0.0 standard drinks of alcohol   Drug use: No   Sexual activity: Not on file  Other Topics Concern   Not on file  Social History Narrative   From [place]   Moved to [place] 1968   Moved to [place] 2006   5 daughters   Social Determinants of Health   Financial Resource Strain: Not on file  Food Insecurity: No Food Insecurity (04/18/2023)   Hunger Vital Sign    Worried About Running Out of Food in the Last Year: Never true    Ran Out of Food in the Last Year: Never true  Transportation Needs: No Transportation Needs  (04/18/2023)   PRAPARE - Administrator, Civil Service (Medical): No    Lack of Transportation (Non-Medical): No  Physical Activity: Not on file  Stress: Not on file  Social Connections: Not on file   Family History  Problem Relation Age of Onset   Liver disease Father    Kidney disease Father    Heart disease Father        before age 87   Heart disease Mother        before age 85   Scheduled Meds:  lidocaine   Topical BID   And   capsaicin   Topical BID   rosuvastatin  40 mg  Oral Daily   tamsulosin  0.4 mg Oral Daily   Continuous Infusions: PRN Meds:.acetaminophen **OR** acetaminophen, benzonatate, haloperidol lactate, haloperidol lactate, metoprolol tartrate, ondansetron **OR** ondansetron (ZOFRAN) IV, mouth rinse Medications Prior to Admission:  Prior to Admission medications   Medication Sig Start Date End Date Taking? Authorizing Provider  benzonatate (TESSALON) 100 MG capsule Take 100 mg by mouth 3 (three) times daily as needed for cough. 04/03/23 04/02/24 Yes [provider]  colchicine 0.6 MG tablet Take 0.6 mg by mouth 2 (two) times daily. 02/24/23  Yes [provider]  diclofenac Sodium (VOLTAREN) 1 % GEL Apply 2 g topically 4 (four) times daily as needed (joint pain). 08/31/21  Yes [provider]  furosemide (LASIX) 40 MG tablet Take 40 mg by mouth daily. Prescribed by Dr [facility]) since May 2016   Yes [provider]  isosorbide mononitrate (IMDUR) 60 MG 24 hr tablet Take 1 tablet (60 mg total) by mouth daily. Patient taking differently: Take 90 mg by mouth daily. 04/18/23  Yes Derwood Kaplan, MD  metoprolol tartrate (LOPRESSOR) 50 MG tablet Take 50 mg by mouth 2 (two) times daily.   Yes [provider]  ranolazine (RANEXA) 500 MG 12 hr tablet Take 500 mg by mouth 2 (two) times daily. 02/13/23  Yes [provider]  rosuvastatin (CRESTOR) 40 MG tablet TAKE 1 TABLET BY MOUTH EVERY DAY Patient  taking differently: Take 40 mg by mouth daily. 07/24/22  Yes O'Neal, Ronnald Ramp, MD  tamsulosin (FLOMAX) 0.4 MG CAPS capsule Take 0.4 mg by mouth daily. 06/14/20  Yes [provider]  Darbepoetin Alfa (ARANESP) 60 MCG/0.3ML SOSY injection Inject 0.3 mLs (60 mcg total) into the skin every 7 (seven) days. Patient not taking: Reported on 04/18/2023 04/14/23   Dimple Nanas, MD  nitroGLYCERIN (NITROSTAT) 0.4 MG SL tablet PLACE 1 TABLET UNDER THE TONGUE EVERY 5 MINUTES AS NEEDED FOR CHEST PAIN Patient taking differently: Place 0.4 mg under the tongue every 5 (five) minutes as needed for chest pain. 10/16/21   O'NealRonnald Ramp, MD   Allergies  Allergen Reactions   Amoxicillin-Pot Clavulanate Other (See Comments)    Did it involve swelling of the face/tongue/throat, SOB, or low BP? Unknown Did it involve sudden or severe rash/hives, skin peeling, or any reaction on the inside of your mouth or nose? Unknown Did you need to seek medical attention at a hospital or doctor's office? Unknown When did it last happen? patient is unfamiliar with this allergy   If all above answers are "NO", may proceed with cephalosporin use.    Atorvastatin     REACTION: arthralgia   Lovastatin     REACTION: arthralgia   Review of Systems  HENT:  Positive for ear pain.     Physical Exam Constitutional:      Appearance: He is cachectic. He is ill-appearing.  Cardiovascular:     Rate and Rhythm: Tachycardia present.  Pulmonary:     Effort: Pulmonary effort is normal.  Musculoskeletal:     Comments: Generalized weakness and muscle atrophy  Skin:    General: Skin is warm and dry.  Neurological:     Mental Status: He is alert and oriented to person, place, and time.     Vital Signs: BP (!) 146/82 (BP Location: Right Arm)   Pulse (!) 110   Temp 98.2 F (36.8 C) (Oral)   Resp 20   Ht 5\' 5"  (1.651 m)   Wt 45.9 kg  SpO2 95%   BMI 16.84 kg/m  Pain Scale: 0-10   Pain Score:  Asleep   SpO2: SpO2: 95 % O2 Device:SpO2: 95 % O2 Flow Rate: .   IO: Intake/output summary:  Intake/Output Summary (Last 24 hours) at 04/20/2023 0944 Last data filed at 04/20/2023 0900 Gross per 24 hour  Intake 768.05 ml  Output 100 ml  Net 668.05 ml    LBM: Last BM Date : 04/19/23 Baseline Weight: Weight: 45.4 kg Most recent weight: Weight: 45.9 kg     Palliative Assessment/Data: 40 % at best todayy    Time: 75 minutes  Signed by: Lorinda Creed, NP   Please contact Palliative Medicine Team phone at [phone] for questions and concerns.  For individual provider: See Loretha Stapler

## 2023-04-20 NOTE — Progress Notes (Addendum)
Received a call from bedside RN regarding the patient having pain in his right ear.    Presented at bedside.  Family member at bedside. Examined the patient's right ear using a bright external light, earwax was noted.  The tragus did not appear inflamed.  No erythema, edema, or significant tenderness noted with pulling of the ear.  The left ear was also examined using a bright external light and was essentially the same.  Patient states his ears hurt when he shakes.  No acute findings noted on limited exam.  Advised to follow-up with ENT at discharge.  Time: 15 minutes.

## 2023-04-20 NOTE — Progress Notes (Signed)
Progress Note   Patient: Pedro Young ZDG:[id number] DOB: 20-May-1933 DOA: 04/18/2023     1 DOS: the patient was seen and examined on 04/20/2023   Brief hospital course: 89yo with h/o HTN, HLD, CAD, depression, gout, and recent ESRD on TTS HD who presented on 6/15 with weakness, dizziness.  He was found to have acute anemia, Hgb 9 -> 7.3 and was transfused 1 unit PRBC. Developed delirium following admission.  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 admitted for this issue earlier this month, along with AMS, hallucinations -He was started on HD and symptoms improved over the next few days, thought to be related to uremia -Now back with similar symptoms despite ongoing HD, seems unlikely related to this issue -Head CT today with atrophy but no acute changes -Neurology consulted -EEG pending -He was given a dose of gabapentin overnight; while this is suboptimal it is unlikely to have caused the presenting symptoms -Ranexa has a rare side effect of neurologic toxicity in the setting of renal failure and may be causing symptoms; will hold Ranexa and continue to monitor -neurology recommends a brain MRI if symptoms are not resolving soon (currently unable to tolerate based on myoclonus) -Palliative care consulted -If there is not a clearly reversible cause, family is likely to stop HD and transition to comfort care only  Acute on chronic anemia -Hgb down to 7.3 from baseline of 9.0 just one week ago at time of presentation -Repeat Hgb 8.2 today -known ACD from CKD>ESRD on dialysis. No active bleed -transfused one unit PRBC on admission -nephrology following  -aranesp per nephrology -trend cbc   ESRD on dialysis [facility]) -Recent transition from CKD stage V>ESRD after hospitalization on 6/3-6/7 -ESRD on TTS, did not have session today  -Nephrology consulted -Had dialysis yesterday but no fluid was drawn off due to concern for hypovolemia -Considering discontinuation of HD  given lack of improvement since starting  HYPERTENSION, BENIGN ESSENTIAL -Blood pressure medication has not been adjusted after recent transition to ESRD and now having hypotensive episodes -Per nephrology: decrease metoprolol to 25mg  BID, stop norvasc and decrease imdur to 60mg   -Hold anti-Htn medication on dialysis days until after dialysis  -Will have parameters to hold anti-hypertensive for systolic <110  -Hold for now since hypotensive   Subclinical hypothyroidism -TSH is elevated, may be sick euthyroid -Not on Synthroid at this time -likely needs follow up TSH in 4-6 weeks pending clinical course  CAD (coronary artery disease) -Continue medical management  -He is on Imdur, will continue -Stopping Ranexa, as above; if chest pain recurs could trial it once daily but better to avoid if possible  Hyperlipidemia -Continue rosuvastatin  BPH (benign prostatic hyperplasia) -Continue flomax      Subjective: One of the clinical pharmacists recognized the possibility that Ranexa could be causing his symptoms yesterday - with renal insufficiency, it can lead to neurotoxicity and the symptoms he has been having; it is also not dialyzable.  As such, the medication was stopped yesterday.  He did report B ear pain overnight but is using a heating pad with apparent improvement.  This AM, his myoclonus is "60% better" with improved mentation, comprehensible speech per his daughter.  Physical Exam: Vitals:   04/19/23 2110 04/20/23 0430 04/20/23 0900 04/20/23 1630  BP: (!) 123/49 (!) 146/82 136/78 (!) 143/68  Pulse: 81 (!) 110 99 95  Resp: 18 20 18 18   Temp: 98.6 F (37 C) 98.2 F (36.8 C) 98 F (36.7 C) 97.9 F (36.6  C)  TempSrc: Oral Oral Oral Oral  SpO2: 98% 95% 98% 100%  Weight:  45.9 kg    Height:       General:  Appears more alert, appropriate; still with less severe but present myoclonus Eyes:  EOMI, normal lids, iris ENT:  grossly normal hearing, lips & tongue, mmm Neck:   no LAD, masses or thyromegaly Cardiovascular:  RRR, no m/r/g. No LE edema.  Respiratory:   CTA bilaterally with no wheezes/rales/rhonchi.  Normal respiratory effort. Abdomen:  soft, NT, ND Skin:  no rash or induration seen on limited exam Musculoskeletal:  grossly normal tone BUE/BLE, good ROM, no bony abnormality Psychiatric:  grossly normal mood and affect, speech sparse  but more appropriate Neurologic:  CN 2-12 grossly intact, diminished but present myoclonus   Radiological Exams on Admission: Independently reviewed - see discussion in A/P where applicable  EEG adult  Result Date: 04/19/2023 Charlsie Quest, MD     04/19/2023 12:56 PM Patient Name: Pedro Young MRN: [id number] Epilepsy Attending: Charlsie Quest Referring Physician/Provider: Elmer Picker, NP Date: 04/19/2023 Duration: 22.42 mins Patient history: 89yo M with seizure like activity getting eeg to evaluate for seizure. Level of alertness: Awake AEDs during EEG study: None Technical aspects: This EEG study was done with scalp electrodes positioned according to the 10-20 International system of electrode placement. Electrical activity was reviewed with band pass filter of 1-70Hz , sensitivity of 7 uV/mm, display speed of 39mm/sec with a 60Hz  notched filter applied as appropriate. EEG data were recorded continuously and digitally stored.  Video monitoring was available and reviewed as appropriate. Description: EEG showed continuous generalized 3 to 6 Hz theta-delta slowing. Patient was noted to have episodes of brief whole body jerking every few seconds.  Concomitant EEG before, during and after the event showed myogenic artifact consistent with myoclonus  Hyperventilation and photic stimulation were not performed.   ABNORMALITY - Continuous slow, generalized IMPRESSION: This study is suggestive of moderate diffuse encephalopathy, nonspecific etiology. No seizures or epileptiform discharges were seen throughout the recording. Patient  was noted to have episodes of brief whole body jerking every few seconds without concomitant EEG change. The semiology of episodes was concerning for subcortical myoclonus. Priyanka Annabelle Harman   CT HEAD WO CONTRAST ( )  Result Date: 04/19/2023 CLINICAL DATA:  Altered mental status. EXAM: CT HEAD WITHOUT CONTRAST TECHNIQUE: Contiguous axial images were obtained from the base of the skull through the vertex without intravenous contrast. RADIATION DOSE REDUCTION: This exam was performed according to the departmental dose-optimization program which includes automated exposure control, adjustment of the mA and/or kV according to patient size and/or use of iterative reconstruction technique. COMPARISON:  None Available. FINDINGS: Brain: No evidence of intracranial hemorrhage, acute infarction, hydrocephalus, extra-axial collection, or mass lesion/mass effect. Moderate cerebral atrophy and chronic small vessel disease are noted. Vascular:  No hyperdense vessel or other acute findings. Skull: No evidence of fracture or other significant bone abnormality. Sinuses/Orbits:  No acute findings. Other: None. IMPRESSION: No acute intracranial abnormality. Moderate cerebral atrophy and chronic small vessel disease. Electronically Signed   By: Danae Orleans M.D.   On: 04/19/2023 10:33      Pertinent labs:     BUN 35/Creatinine 3/GFR 19 WBC 8.7 Hgb 9.2   Family Communication: Daughter was present throughout evaluation  Disposition: Status is: Inpatient Remains inpatient appropriate because: persistent neurologic symptoms  Planned Discharge Destination:  to be determined    Time spent: 50 minutes  Author: Jonah Blue, MD 04/20/2023 4:50  PM  For on call review [email or web address].

## 2023-04-20 NOTE — Plan of Care (Signed)
  Problem: Health Behavior/Discharge Planning: Goal: Ability to manage health-related needs will improve Outcome: Completed/Met   

## 2023-04-20 NOTE — Progress Notes (Signed)
[facility] Progress Note   Subjective:  Seen in room - daughter at bedside. She states jerking movements started after dialysis here on Saturday. He has had a rough start with dialysis. Came to hospital after he had syncopal episode in the outpatient clinic and found to be anemic as well.  Per notes he did get 1 dose gabapentin here.  This am --states he is improving, still having some jerking but was able to hold utensils and eat this morning. He wasn't able to do that yesterday and was very discouraged and expressed he didn't want to continue if he was going to be in this state. Today mental status improved, oriented to person, place. For now they want to continue dialysis and hope to work out the kinks.    Objective Vitals:   04/19/23 1656 04/19/23 2110 04/20/23 0430 04/20/23 0900  BP:  (!) 123/49 (!) 146/82 136/78  Pulse: 85 81 (!) 110 99  Resp:  18 20 18   Temp:  98.6 F (37 C) 40.9 F (36.8 C) 98 F (36.7 C)  TempSrc:  Oral Oral Oral  SpO2:  98% 95% 98%  Weight:   45.9 kg   Height:       Physical Exam General: Frail man, lying in bed, nad  Heart: RRR; no murmur Lungs: CTA anteriorly Abdomen: soft Extremities: no LE edema Neuro: Oriented x 2. Continues with some myoclonic jerking -improving. Oriented x 2.  Dialysis Access:  L forearm AVF  Additional Objective Labs: Basic Metabolic Panel: Recent Labs  Lab 04/18/23 1412 04/18/23 1756 04/19/23 0416 04/20/23 0435  NA 136  --  131* 135  K 4.3  --  3.4* 4.3  CL 96*  --  95* 96*  CO2 30  --  27 27  GLUCOSE 101*  --  102* 88  BUN 56*  --  21 35*  CREATININE 4.04*  --  1.93* 3.00*  CALCIUM 7.7*  --  7.5* 8.4*  PHOS  --  3.1  --   --     Liver Function Tests: Recent Labs  Lab 04/18/23 1412  AST 23  ALT 18  ALKPHOS 83  BILITOT 1.0  PROT 5.8*  ALBUMIN 2.5*    CBC: Recent Labs  Lab 04/18/23 1412 04/19/23 0416 04/20/23 0435  WBC 6.0 6.7 8.7  NEUTROABS 3.5  --  6.0  HGB 7.3* 8.2* 9.2*   HCT 22.7* 24.7* 28.1*  MCV 100.9* 99.2 100.4*  PLT 175 147* 177    Studies/Results: EEG adult  Result Date: 04/19/2023 Charlsie Quest, MD     04/19/2023 12:56 PM Patient Name: Jawaan Lanfear MRN: [id number] Epilepsy Attending: Charlsie Quest Referring Physician/Provider: Elmer Picker, NP Date: 04/19/2023 Duration: 22.42 mins Patient history: 89yo M with seizure like activity getting eeg to evaluate for seizure. Level of alertness: Awake AEDs during EEG study: None Technical aspects: This EEG study was done with scalp electrodes positioned according to the 10-20 International system of electrode placement. Electrical activity was reviewed with band pass filter of 1-70Hz , sensitivity of 7 uV/mm, display speed of 64mm/sec with a 60Hz  notched filter applied as appropriate. EEG data were recorded continuously and digitally stored.  Video monitoring was available and reviewed as appropriate. Description: EEG showed continuous generalized 3 to 6 Hz theta-delta slowing. Patient was noted to have episodes of brief whole body jerking every few seconds.  Concomitant EEG before, during and after the event showed myogenic artifact consistent with myoclonus  Hyperventilation and photic stimulation were  not performed.   ABNORMALITY - Continuous slow, generalized IMPRESSION: This study is suggestive of moderate diffuse encephalopathy, nonspecific etiology. No seizures or epileptiform discharges were seen throughout the recording. Patient was noted to have episodes of brief whole body jerking every few seconds without concomitant EEG change. The semiology of episodes was concerning for subcortical myoclonus. Priyanka Annabelle Harman   CT HEAD WO CONTRAST ( )  Result Date: 04/19/2023 CLINICAL DATA:  Altered mental status. EXAM: CT HEAD WITHOUT CONTRAST TECHNIQUE: Contiguous axial images were obtained from the base of the skull through the vertex without intravenous contrast. RADIATION DOSE REDUCTION: This exam was  performed according to the departmental dose-optimization program which includes automated exposure control, adjustment of the mA and/or kV according to patient size and/or use of iterative reconstruction technique. COMPARISON:  None Available. FINDINGS: Brain: No evidence of intracranial hemorrhage, acute infarction, hydrocephalus, extra-axial collection, or mass lesion/mass effect. Moderate cerebral atrophy and chronic small vessel disease are noted. Vascular:  No hyperdense vessel or other acute findings. Skull: No evidence of fracture or other significant bone abnormality. Sinuses/Orbits:  No acute findings. Other: None. IMPRESSION: No acute intracranial abnormality. Moderate cerebral atrophy and chronic small vessel disease. Electronically Signed   By: Danae Orleans M.D.   On: 04/19/2023 10:33   DG Chest Port 1 View  Result Date: 04/18/2023 CLINICAL DATA:  Weakness, concern for pneumonia. EXAM: PORTABLE CHEST 1 VIEW COMPARISON:  Chest radiograph dated 04/06/2023. FINDINGS: The heart size and mediastinal contours are within normal limits. Vascular calcifications are seen in the aortic arch. Both lungs are clear. The visualized skeletal structures are unremarkable. IMPRESSION: No active cardiopulmonary disease. Electronically Signed   By: Romona Curls M.D.   On: 04/18/2023 15:29    Medications:    lidocaine   Topical BID   And   capsaicin   Topical BID   rosuvastatin  40 mg Oral Daily   tamsulosin  0.4 mg Oral Daily    Dialysis Orders: SW TTS  3.5h  350/400   46.6kg   3K/2Ca bath   AVF  Heparin none - last HD 6/13, post wt 47.9kg - not on vdra or esa   Assessment/ Plan: Weakness/hypotension: On admit, HTN regimen reduced and was given IVF + 1U PRBCs.  Myoclonic jerking: Started after HD Saturday - similar to prior presentation when HD was started - presumed uremic at that time, not the case now. Was given 1 dose gabapentin earlier - not the best choice for ESRD patient b/c caan confound  issue and cause similar symptoms, but in is case - symtpoms preceded the med. Head CT without clear issue - chronic atrophy. Neuro and Palliative have been consulted by PMD. ESRD: Last dialyzed Sat with no UF.  Got more IVF.  Next HD 6/18. Keep even.  Acute on chronic anemia: S/p 1U PRBCs. Hgb 9.2 this AM. Hypotension: Volume ok. BP picking back up.  All BP meds on hold (amlod, metop, isosorb).  Secondary HPTH: CorrCa/Phos ok. CAD: On ranexa for presumed anginal symptoms - would hold HL: On high dose statin - would hold GOC: HD started 6/3 - has been a rough start. They would like to continue dialysis at this time and see  if his symptoms improve. Unclear cause of the myoclonic jerking. They do understand that hospice is an option for him as well - wife currently in hospice at the family home - sad situation overall.  Tomasa Blase PA-C [facility] 04/20/2023,10:24 AM

## 2023-04-20 NOTE — Progress Notes (Addendum)
Neurology Progress Note  Brief HPI: 89 year old patient with history of hypertension, hyperlipidemia, CAD, gout, end-stage renal disease on dialysis presented on 6/15 with complaints of weakness and dizziness.  According to family, he is alert and oriented x 4 at baseline.  After present presentation, he had a dialysis session and was given 1 unit of PRBCs.  He was then noted to have whole body jerking overnight but was still at his baseline mental status, and in the morning he began to have increased confusion and hallucinations.  Head CT showed no acute abnormality.  Patient did get a dose of gabapentin this admission, which has now been discontinued.    Subjective: Patient is awake and oriented to person, place and year but gets month wrong.  He has continued myoclonic jerking of all 4 extremities, which appears worse when he is asleep than when he is awake.  He reports that this makes it difficult for him to care for himself and feed himself.  He does not appear to be responding to internal stimuli at this point.  He complains of right ear pain and muffled hearing in the right ear.  Exam: Vitals:   04/19/23 2110 04/20/23 0430  BP: (!) 123/49 (!) 146/82  Pulse: 81 (!) 110  Resp: 18 20  Temp: 98.6 F (37 C) 98.2 F (36.8 C)  SpO2: 98% 95%   Gen: In bed, NAD, chronically ill-appearing Resp: non-labored breathing, no acute distress EENT: On otoscopic exam, right ear appears normal with some cerumen in ear canal, tympanic membrane intact  Neuro: Mental Status: Alert and oriented to person and place, gets year right but month wrong, able to speak both [place] and Spanish Cranial Nerves: Pupils equal round and reactive to light, extraocular movements intact, facial sensation symmetrical, face symmetrical, hearing intact to voice, phonation normal, shoulder shrug symmetrical, tongue midline Motor: Able to move all 4 extremities with good antigravity strength, myoclonic jerking noted in all 4  extremities, appears worse when patient is asleep and when he is awake Coordination: finger to nose intact bilaterally, much less myoclonic activity than on 6/16 Sensory: Intact to light touch throughout Gait: Deferred  Pertinent Labs:    Latest Ref Rng & Units 04/20/2023    4:35 AM 04/19/2023    4:16 AM 04/18/2023    2:12 PM  CBC  WBC 4.0 - 10.5 K/uL 8.7  6.7  6.0   Hemoglobin 13.0 - 17.0 g/dL 9.2  8.2  7.3   Hematocrit 39.0 - 52.0 % 28.1  24.7  22.7   Platelets 150 - 400 K/uL 177  147  175        Latest Ref Rng & Units 04/20/2023    4:35 AM 04/19/2023    4:16 AM 04/18/2023    2:12 PM  BMP  Glucose 70 - 99 mg/dL 88  161  096   BUN 8 - 23 mg/dL 35  21  56   Creatinine 0.61 - 1.24 mg/dL 0.45  4.09  8.11   Sodium 135 - 145 mmol/L 135  131  136   Potassium 3.5 - 5.1 mmol/L 4.3  3.4  4.3   Chloride 98 - 111 mmol/L 96  95  96   CO2 22 - 32 mmol/L 27  27  30    Calcium 8.9 - 10.3 mg/dL 8.4  7.5  7.7   Ammonia 18  TSH 10.749 T41.06 Folate 7.1 RPR nonreactive Thiamine pending B12 556  Imaging Reviewed:  CT head: No acute abnormality, atrophy and  chronic microvascular ischemic disease  EEG: Continuous slow, generalized, suggestive of moderate diffuse encephalopathy, episodes of brief whole body jerking were noted without concomitant EEG change  Assessment: 89 year old patient with history of hypertension, hyperlipidemia, CAD, depression, gout and end-stage renal disease on dialysis originally presented on 6/15 with weakness and dizziness.  He has been dialyzed and was transfused with packed red blood cells. In the morning of 6/16, he had an episode of confusion with hallucinations and full body myoclonic jerking movements.  He did receive gabapentin prior to this and has not taken that medication previously at home.  Gabapentin has been discontinued as this can worsen myoclonus. Ranexa may also be playing a role It is possible that myoclonus is a manifestation of patient's renal failure  as well as postdialysis syndrome, and it can also be seen in progressive dementia.  Patient has had a cognitive decline over the past few months, so this is possible.  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  -MRI brain to exclude acute intracranial process  -Neurology will follow-up MRI brain but otherwise be available as needed going forward, outpatient referral to neurology for follow-up in 2-3 months, amb ref to Dr. Arbutus Leas Adolph Pollack) placed  Patient seen by NP and then by MD, MD to edit note is needed  Cortney E Ernestina Columbia , MSN, AGACNP-BC [facility] See Amion for schedule and pager information 04/20/2023 8:33 AM    Attending Neurologist's note:  I personally saw this patient, gathering history, performing a full neurologic examination, reviewing relevant labs, personally reviewing relevant imaging including head CT, and formulated the assessment and plan, adding the note above for completeness and clarity to accurately reflect my thoughts  Brooke Dare MD-PhD [facility] [phone] Available 7 AM to 7 PM, outside these hours please contact Neurologist on call listed on AMION

## 2023-04-21 ENCOUNTER — Inpatient Hospital Stay (HOSPITAL_COMMUNITY): Payer: Medicare HMO

## 2023-04-21 DIAGNOSIS — G253 Myoclonus: Secondary | ICD-10-CM | POA: Diagnosis not present

## 2023-04-21 LAB — RENAL FUNCTION PANEL
Albumin: 2.4 g/dL — ABNORMAL LOW (ref 3.5–5.0)
Anion gap: 11 (ref 5–15)
BUN: 48 mg/dL — ABNORMAL HIGH (ref 8–23)
CO2: 26 mmol/L (ref 22–32)
Calcium: 8 mg/dL — ABNORMAL LOW (ref 8.9–10.3)
Chloride: 98 mmol/L (ref 98–111)
Creatinine, Ser: 3.6 mg/dL — ABNORMAL HIGH (ref 0.61–1.24)
GFR, Estimated: 16 mL/min — ABNORMAL LOW (ref >60)
Glucose, Bld: 86 mg/dL (ref 70–99)
Phosphorus: 3.6 mg/dL (ref 2.5–4.6)
Potassium: 3.6 mmol/L (ref 3.5–5.1)
Sodium: 135 mmol/L (ref 135–145)

## 2023-04-21 LAB — CBC
HCT: 27.3 % — ABNORMAL LOW (ref 39.0–52.0)
Hemoglobin: 9.1 g/dL — ABNORMAL LOW (ref 13.0–17.0)
MCH: 33.7 pg (ref 26.0–34.0)
MCHC: 33.3 g/dL (ref 30.0–36.0)
MCV: 101.1 fL — ABNORMAL HIGH (ref 80.0–100.0)
Platelets: 184 10*3/uL (ref 150–400)
RBC: 2.7 MIL/uL — ABNORMAL LOW (ref 4.22–5.81)
RDW: 15.5 % (ref 11.5–15.5)
WBC: 5.8 10*3/uL (ref 4.0–10.5)
nRBC: 0 % (ref 0.0–0.2)

## 2023-04-21 MED ORDER — LIDOCAINE-PRILOCAINE 2.5-2.5 % EX CREA: 1.0000 | TOPICAL_CREAM | CUTANEOUS | Status: DC | PRN

## 2023-04-21 MED ORDER — COLCHICINE 0.6 MG PO TABS: 0.6000 mg | ORAL_TABLET | Freq: Two times a day (BID) | ORAL | 1 refills | Status: DC | PRN

## 2023-04-21 MED ORDER — HEPARIN SODIUM (PORCINE) 1000 UNIT/ML DIALYSIS: 1000.0000 [IU] | INTRAMUSCULAR | Status: DC | PRN

## 2023-04-21 MED ORDER — ROSUVASTATIN CALCIUM 10 MG PO TABS: 40.0000 mg | ORAL_TABLET | Freq: Every day | ORAL | 1 refills | Status: DC

## 2023-04-21 MED ORDER — ANTICOAGULANT SODIUM CITRATE 4% (200MG/5ML) IV SOLN: 5.0000 mL | Status: DC | PRN

## 2023-04-21 MED ORDER — LIDOCAINE HCL (PF) 1 % IJ SOLN: 5.0000 mL | INTRAMUSCULAR | Status: DC | PRN

## 2023-04-21 MED ORDER — ALTEPLASE 2 MG IJ SOLR: 2.0000 mg | Freq: Once | INTRAMUSCULAR | Status: DC | PRN

## 2023-04-21 MED ORDER — PENTAFLUOROPROP-TETRAFLUOROETH EX AERO: 1.0000 | INHALATION_SPRAY | CUTANEOUS | Status: DC | PRN

## 2023-04-21 MED ORDER — METOPROLOL TARTRATE 25 MG PO TABS: 50.0000 mg | ORAL_TABLET | Freq: Two times a day (BID) | ORAL | 1 refills | Status: DC

## 2023-04-21 NOTE — Evaluation (Signed)
Physical Therapy Evaluation Patient Details Name: Pedro Young MRN: [id number] DOB: [birth year] Today's Date: 04/21/2023  History of Present Illness  Patient is an 89 y.o. male who presented on 04/18/2023 with weakness, dizziness. PMH significant HTN, HLD, CAD, depression, gout, and recent ESRD on TTS HD.    Clinical Impression  Upon evaluation, patient upright in bed with family present alert and oriented. Prior to admission, patient was independent with functional mobility and ADLs; was also receiving home health PT in order to improve functional impairments. Currently, pt is mod I with bed mobility and transfers only requiring intermittent cues for scooting or hand placement. Demonstrated fair ability to ambulate household distances without physical assistance. Demonstrated good strength in lower extremity muscles and was able to perform coordination task without episodes or jerking/twitching. Anticipating discharge to home without home rehab in order to continue improving functional mobility and return to prior level of function.      Recommendations for follow up therapy are one component of a multi-disciplinary discharge planning process, led by the attending physician.  Recommendations may be updated based on patient status, additional functional criteria and insurance authorization.  Follow Up Recommendations       Assistance Recommended at Discharge Intermittent Supervision/Assistance  Patient can return home with the following  A little help with walking and/or transfers;A little help with bathing/dressing/bathroom    Equipment Recommendations None recommended by PT  Recommendations for Other Services       Functional Status Assessment Patient has had a recent decline in their functional status and demonstrates the ability to make significant improvements in function in a reasonable and predictable amount of time.     Precautions / Restrictions Precautions Precautions:  Fall Restrictions Weight Bearing Restrictions: No      Mobility  Bed Mobility Overal bed mobility: Modified Independent             General bed mobility comments: Increased time; Performed without physical assistance. Bed rail support provided to simulate hospital bed at home    Transfers Overall transfer level: Needs assistance Equipment used: Rolling walker (2 wheels) Transfers: Sit to/from Stand Sit to Stand: Supervision           General transfer comment: Spontaneous strong power up from Sit to stand without physical assistance; vc provided to scoot EOB    Ambulation/Gait Ambulation/Gait assistance: Supervision Gait Distance (Feet): 80 Feet Assistive device: Rolling walker (2 wheels) Gait Pattern/deviations: Step-through pattern, Decreased stride length Gait velocity: decreased Gait velocity interpretation: <1.31 ft/sec, indicative of household ambulator Pre-gait activities: Marching EOB x5 per leg with RW support General Gait Details: Decreased velocity; step through pattern and no physical assistance provided. Pt endorsed significant improvements with walking as previous attempt to walk had one instance of imbalance. Demonstrated ability to take large step to simulate obstacle or step into the house.  Stairs            Wheelchair Mobility    Modified Rankin (Stroke Patients Only)       Balance Overall balance assessment: Needs assistance Sitting-balance support: No upper extremity supported, Feet supported Sitting balance-Leahy Scale: Good Sitting balance - Comments: Sat at EOB without physical assistance   Standing balance support: Single extremity supported, During functional activity Standing balance-Leahy Scale: Poor Standing balance comment: Ambulated with hand held support in room; presented with cautious gait but with step through pattern.  Pertinent Vitals/Pain Pain Assessment Pain Assessment:  No/denies pain    Home Living Family/patient expects to be discharged to:: Private residence Living Arrangements: Spouse/significant other;Children (Wife is on Hospice) Available Help at Discharge: Family Type of Home: House Home Access: Stairs to enter Entrance Stairs-Rails: None Entrance Stairs-Number of Steps: 1   Home Layout: One level Home Equipment: Cane - single Librarian, academic (2 wheels);Wheelchair - manual;Hospital bed;Grab bars - tub/shower;Shower seat Additional Comments: equipment is his wife's, but she is not currently using    Prior Function Prior Level of Function : Independent/Modified Independent             Mobility Comments: Independent with functional mobility; uses cane orRW for functional mobility as needed. ADLs Comments: typically independent/Mod I level     Hand Dominance   Dominant Hand: Right    Extremity/Trunk Assessment   Upper Extremity Assessment Upper Extremity Assessment: Overall WFL for tasks assessed RUE Deficits / Details: Informal bilateral MMT on UE presented 5/5 in flexion, ext and hand grip RUE Coordination: WNL (5/5 Finger To Nose) LUE Coordination: WNL (5/5 on Finger to Nose; increased time and focus on finger)    Lower Extremity Assessment Lower Extremity Assessment: Overall WFL for tasks assessed (Formal MMT 5/5 in BLE; coordination normal for BLE) RLE Deficits / Details: Formal Bilateral MMT of LEs presented with 5/5 strength on all muscle groups. RLE Coordination: WNL (5/5 heel to shin, supine) LLE Coordination: WNL (5/5 Heel to shin, supine)    Cervical / Trunk Assessment Cervical / Trunk Assessment: Normal  Communication   Communication: No difficulties  Cognition Arousal/Alertness: Awake/alert Behavior During Therapy: WFL for tasks assessed/performed Overall Cognitive Status: Within Functional Limits for tasks assessed                                 General Comments: Alert and Oriented x4;  patient able to follow one-step commands consistently; intermittent cue from family member for clarification on task/movement. Pleasant throughout entire therapy session        General Comments General comments (skin integrity, edema, etc.): daughter present during session    Exercises     Assessment/Plan    PT Assessment Patient does not need any further PT services  PT Problem List Decreased strength;Decreased mobility;Decreased activity tolerance;Decreased knowledge of use of DME;Decreased balance;Decreased cognition;Decreased knowledge of precautions;Decreased safety awareness       PT Treatment Interventions      PT Goals (Current goals can be found in the Care Plan section)  Acute Rehab PT Goals Patient Stated Goal: Would like to return home PT Goal Formulation: With patient Time For Goal Achievement: 04/21/23 Potential to Achieve Goals: Good    Frequency       Co-evaluation               AM-PAC PT "6 Clicks" Mobility  Outcome Measure Help needed turning from your back to your side while in a flat bed without using bedrails?: None Help needed moving from lying on your back to sitting on the side of a flat bed without using bedrails?: None Help needed moving to and from a bed to a chair (including a wheelchair)?: A Little Help needed standing up from a chair using your arms (e.g., wheelchair or bedside chair)?: A Little Help needed to walk in hospital room?: A Little Help needed climbing 3-5 steps with a railing? : A Lot 6 Click Score: 19  End of Session Equipment Utilized During Treatment: Gait belt Activity Tolerance: Patient tolerated treatment well Patient left: in chair;with chair alarm set;with family/visitor present Nurse Communication: Mobility status PT Visit Diagnosis: Other abnormalities of gait and mobility (R26.89)    Time: 0202-0230 PT Time Calculation (min) (ACUTE ONLY): 28 min   Charges:   PT Evaluation $PT Eval Low Complexity: 1  Low PT Treatments $Gait Training: 8-22 mins        Christene Lye, SPT Acute Rehabilitation Services [phone] Secure chat preferred    Christene Lye 04/21/2023, 4:00 PM

## 2023-04-21 NOTE — Discharge Summary (Signed)
Physician Discharge Summary   Patient: Pedro Young MRN: [id number] DOB: 1933/08/02  Admit date:     04/18/2023  Discharge date: 04/21/23  Discharge Physician: Jonah Blue   PCP: Maudie Flakes, FNP   Recommendations at discharge:   Do not take Ranexa (Ranolazine) Decrease dose of metoprolol to 25 mg twice daily Stop Norvasc (amlodipine) Decrease dose of Imdur to 60 mg daily Hold metoprolol and Imdur until after dialysis on dialysis days and do not give when systolic BP is <110 Decrease dose of Crestor (rosuvastatin) to 10 mg daily Resume dialysis as per previous schedule Follow up with neurology as directed Follow up with PCP in 1-2 weeks  Discharge Diagnoses: Principal Problem:   Myoclonia Active Problems:   Acute on chronic anemia   ESRD on dialysis (HCC)   HYPERTENSION, BENIGN ESSENTIAL   Subclinical hypothyroidism   CAD (coronary artery disease)   Hyperlipidemia   BPH (benign prostatic hyperplasia)   Hospital Course: 89yo with h/o HTN, HLD, CAD, depression, gout, and recent ESRD on TTS HD who presented on 6/15 with weakness, dizziness.  He was found to have acute anemia, Hgb 9 -> 7.3 and was transfused 1 unit PRBC. Developed delirium following admission with marked myoclonus.  Pharmacy noted a rare side effect of Ranexa associated with myoclonus and hallucinations from neurotoxicity in the setting of renal failure.  It is not cleared with HD.  As the Ranexa cleared, he showed ongoing improvement and was assessed by PT/OT on 6/18 and was deemed ready for dc with no home assistance needed.  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 admitted for this issue earlier this month, along with AMS, hallucinations -He was started on HD and symptoms improved over the next few days, thought to be related to uremia -Now back with similar symptoms despite ongoing HD, seems unlikely related to this issue -Head CT with atrophy but no acute changes -Neurology  consulted -EEG with diffuse generalized slowing -He was given a dose of gabapentin x 1, should be avoided in the future but did not cause myoclonus -Ranexa has a rare side effect of neurologic toxicity in the setting of renal failure and may be causing symptoms; Ranexa held and symptoms resolved -MRI unremarkable -Palliative care consulted -based on ongoing improvement, patient appears to be appropriate for discharge to home at this time  Acute on chronic anemia -Hgb down to 7.3 from baseline of 9.0 just one week ago at time of presentation -transfused one unit PRBC on admission -Repeat Hgb 8.2 -> 9.2, stable -known ACD from CKD>ESRD on dialysis. No active bleed -aranesp per nephrology  ESRD on dialysis [facility]) -Recent transition from CKD stage V>ESRD after hospitalization on 6/3-6/7 -ESRD on TTS -Nephrology consulted -Had dialysis on 6/16 but no fluid was drawn off due to concern for hypovolemia -Considered discontinuation of HD given lack of improvement since starting but HD was resumed since symptoms resolved with cessation of Ranexa -Last HD was 6/18, resume home schedule at time of discharge  HYPERTENSION, BENIGN ESSENTIAL -Blood pressure medication had not been adjusted after recent transition to ESRD, had hypotensive episodes on presentation -Per nephrology: decrease metoprolol to 25mg  BID, stop norvasc and decrease imdur to 60mg   -Hold anti-Htn medication on dialysis days until after dialysis  -Will have parameters to hold anti-hypertensive for systolic <110   Subclinical hypothyroidism -TSH is elevated, may be sick euthyroid -Not on Synthroid at this time -likely needs follow up TSH in 4-6 weeks pending clinical course  CAD (coronary  artery disease) -Continue medical management  -He is on Imdur, will continue at lower dose -Stopping Ranexa, as above; if chest pain recurs could trial it once daily but better to avoid if possible  Hyperlipidemia -Continue rosuvastatin but  at 10 mg daily (has both renal and hepatic clearance)  BPH (benign prostatic hyperplasia) -Continue flomax     Consultants: Neurology; nephrology; PT/OT; [facility] team Procedures performed: EEG, HD  Disposition: Home Diet recommendation:  Renal diet DISCHARGE MEDICATION: Allergies as of 04/21/2023       Reactions   Ranexa [ranolazine Er] Other (See Comments)   Myclonus, hallucinations   Amoxicillin-pot Clavulanate Other (See Comments)   Did it involve swelling of the face/tongue/throat, SOB, or low BP? Unknown Did it involve sudden or severe rash/hives, skin peeling, or any reaction on the inside of your mouth or nose? Unknown Did you need to seek medical attention at a hospital or doctor's office? Unknown When did it last happen? patient is unfamiliar with this allergy   If all above answers are "NO", may proceed with cephalosporin use.   Atorvastatin    REACTION: arthralgia   Lovastatin    REACTION: arthralgia        Medication List     STOP taking these medications    amLODipine 10 MG tablet Commonly known as: NORVASC   Darbepoetin Alfa 60 MCG/0.3ML Sosy injection Commonly known as: ARANESP   furosemide 40 MG tablet Commonly known as: LASIX       TAKE these medications    benzonatate 100 MG capsule Commonly known as: TESSALON Take 100 mg by mouth 3 (three) times daily as needed for cough.   colchicine 0.6 MG tablet Take 1 tablet (0.6 mg total) by mouth 2 (two) times daily as needed (gout). What changed:  when to take this reasons to take this   diclofenac Sodium 1 % Gel Commonly known as: VOLTAREN Apply 2 g topically 4 (four) times daily as needed (joint pain).   isosorbide mononitrate 60 MG 24 hr tablet Commonly known as: IMDUR Take 1 tablet (60 mg total) by mouth daily. What changed: how much to take   metoprolol tartrate 25 MG tablet Commonly known as: LOPRESSOR Take 2 tablets (50 mg total) by mouth 2 (two) times daily. What changed:   medication strength Another medication with the same name was removed. Continue taking this medication, and follow the directions you see here.   nitroGLYCERIN 0.4 MG SL tablet Commonly known as: NITROSTAT PLACE 1 TABLET UNDER THE TONGUE EVERY 5 MINUTES AS NEEDED FOR CHEST PAIN What changed: See the new instructions.   rosuvastatin 10 MG tablet Commonly known as: CRESTOR Take 4 tablets (40 mg total) by mouth daily. What changed: medication strength   tamsulosin 0.4 MG Caps capsule Commonly known as: FLOMAX Take 0.4 mg by mouth daily.        Discharge Exam: Filed Weights   04/20/23 0430 04/21/23 0755 04/21/23 1224  Weight: 45.9 kg 47.3 kg 47.3 kg   Vitals:   04/21/23 1224 04/21/23 1245  BP: (!) 167/82 (!) 153/83  Pulse: 96 92  Resp: 18 18  Temp: 97.9 F (36.6 C) 97.6 F (36.4 C)  SpO2: 99% 99%    General:  Appears calm and comfortable and is in NAD, MUCH improved Eyes:  EOMI, normal lids, iris ENT:  grossly normal hearing, lips & tongue, mmm Neck:  no LAD, masses or thyromegaly Cardiovascular:  RRR, no m/r/g. No LE edema.  Respiratory:   CTA bilaterally  with no wheezes/rales/rhonchi.  Normal respiratory effort. Abdomen:  soft, NT, ND Skin:  no rash or induration seen on limited exam Musculoskeletal:  grossly normal tone BUE/BLE, good ROM, no bony abnormality Psychiatric:  grossly normal mood and affect, speech fluent and appropriate, AOx3 Neurologic:  CN 2-12 grossly intact, moves all extremities in coordinated fashion, myoclonus has resolved      Pertinent labs:     BUN 35/Creatinine 3/GFR 19 WBC 5.8 Hgb 9.1    Condition at discharge: improving  The results of significant diagnostics from this hospitalization (including imaging, microbiology, ancillary and laboratory) are listed below for reference.   Imaging Studies: MR BRAIN WO CONTRAST  Result Date: 04/21/2023 CLINICAL DATA:  Delirium EXAM: MRI HEAD WITHOUT CONTRAST TECHNIQUE: Multiplanar,  multiecho pulse sequences of the brain and surrounding structures were obtained without intravenous contrast. COMPARISON:  Head CT from 2 days ago FINDINGS: Brain: No acute infarction, hemorrhage, hydrocephalus, or masslike finding. Generalized atrophy with mild for age chronic small vessel ischemia in the cerebral white matter. Subdural collection along the right frontal convexity with slight FLAIR hyperintensity, chronic subdural hemorrhage when correlated with prior head CT, up to 5 mm in thickness but no mass effect. Vascular: Normal flow voids. Skull and upper cervical spine: Normal marrow signal. Sinuses/Orbits: Negative. IMPRESSION: 1. Aging brain without acute finding or specific cause for symptoms. 2. 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 Electronically Signed   By: Tiburcio Pea M.D.   On: 04/21/2023 05:26   EEG adult  Result Date: 04/19/2023 Charlsie Quest, MD     04/19/2023 12:56 PM Patient Name: Rechard Stfort MRN: [id number] Epilepsy Attending: Charlsie Quest Referring Physician/Provider: Elmer Picker, NP Date: 04/19/2023 Duration: 22.42 mins Patient history: 89yo M with seizure like activity getting eeg to evaluate for seizure. Level of alertness: Awake AEDs during EEG study: None Technical aspects: This EEG study was done with scalp electrodes positioned according to the 10-20 International system of electrode placement. Electrical activity was reviewed with band pass filter of 1-70Hz , sensitivity of 7 uV/mm, display speed of 16mm/sec with a 60Hz  notched filter applied as appropriate. EEG data were recorded continuously and digitally stored.  Video monitoring was available and reviewed as appropriate. Description: EEG showed continuous generalized 3 to 6 Hz theta-delta slowing. Patient was noted to have episodes of brief whole body jerking every few seconds.  Concomitant EEG before, during and after the event showed myogenic artifact  consistent with myoclonus  Hyperventilation and photic stimulation were not performed.   ABNORMALITY - Continuous slow, generalized IMPRESSION: This study is suggestive of moderate diffuse encephalopathy, nonspecific etiology. No seizures or epileptiform discharges were seen throughout the recording. Patient was noted to have episodes of brief whole body jerking every few seconds without concomitant EEG change. The semiology of episodes was concerning for subcortical myoclonus. Priyanka Annabelle Harman   CT HEAD WO CONTRAST ( )  Result Date: 04/19/2023 CLINICAL DATA:  Altered mental status. EXAM: CT HEAD WITHOUT CONTRAST TECHNIQUE: Contiguous axial images were obtained from the base of the skull through the vertex without intravenous contrast. RADIATION DOSE REDUCTION: This exam was performed according to the departmental dose-optimization program which includes automated exposure control, adjustment of the mA and/or kV according to patient size and/or use of iterative reconstruction technique. COMPARISON:  None Available. FINDINGS: Brain: No evidence of intracranial hemorrhage, acute infarction, hydrocephalus, extra-axial collection, or mass lesion/mass effect. Moderate cerebral atrophy and chronic small vessel disease are noted. Vascular:  No  hyperdense vessel or other acute findings. Skull: No evidence of fracture or other significant bone abnormality. Sinuses/Orbits:  No acute findings. Other: None. IMPRESSION: No acute intracranial abnormality. Moderate cerebral atrophy and chronic small vessel disease. Electronically Signed   By: Danae Orleans M.D.   On: 04/19/2023 10:33   DG Chest Port 1 View  Result Date: 04/18/2023 CLINICAL DATA:  Weakness, concern for pneumonia. EXAM: PORTABLE CHEST 1 VIEW COMPARISON:  Chest radiograph dated 04/06/2023. FINDINGS: The heart size and mediastinal contours are within normal limits. Vascular calcifications are seen in the aortic arch. Both lungs are clear. The visualized  skeletal structures are unremarkable. IMPRESSION: No active cardiopulmonary disease. Electronically Signed   By: Romona Curls M.D.   On: 04/18/2023 15:29   DG Swallowing Func-Speech Pathology  Result Date: 04/08/2023 Table formatting from the original result was not included. Modified Barium Swallow Study Patient Details Name: Alyn Loken MRN: [id number] Date of Birth: Aug 25, 1933 Today's Date: 04/08/2023 HPI/PMH: HPI: 89 yo male presents to ED on 6/3 with fall, weakness, SOB. PMH includes HTN, CKD, preDM, CKD V with HD cath placed but not yet on HD. CT No active cardiopulmonary disease. Clinical Impression: Clinical Impression: Pt exhibited functional oral control, cohesion, mastication and transit across textures. Minimal pharyngeal phase dysphagia without penetration or aspiration. There was mild valleculae and pyriform sinus residue with thin, nectar and solid that he sensed and swallowed spontaneously to clear. He appears to have cervical  and possibly thoracic osteophytes with minimal retention without retrograde movement. There was no coughing during MBS however clinically he does cough when eating which may be from possible esophgeal retention during meals. The barium pill transited esophagus without difficulty. Recommend he continue regular texture, thin liquids, straw allowed, pills with thin, stay upright after meals and alternate liquids and solids. No further ST needed. Factors that may increase risk of adverse event in presence of aspiration [name]: No data recorded Recommendations/Plan: Swallowing Evaluation Recommendations Swallowing Evaluation Recommendations Recommendations: PO diet PO Diet Recommendation: Regular; Thin liquids (Level 0) Liquid Administration via: Straw; Cup Medication Administration: Whole meds with liquid Supervision: Patient able to self-feed Swallowing strategies  : Slow rate; Small bites/sips Postural changes: Position pt fully upright for meals Oral care  recommendations: Oral care BID (2x/day) Treatment Plan Treatment Plan Treatment recommendations: No treatment recommended at this time Follow-up recommendations: No SLP follow up Functional status assessment: Patient has not had a recent decline in their functional status. Recommendations Recommendations for follow up therapy are one component of a multi-disciplinary discharge planning process, led by the attending physician.  Recommendations may be updated based on patient status, additional functional criteria and insurance authorization. Assessment: Orofacial Exam: Orofacial Exam Oral Cavity: Oral Hygiene: WFL Oral Cavity - Dentition: Adequate natural dentition Orofacial Anatomy: WFL Oral Motor/Sensory Function: WFL Anatomy: Anatomy: Suspected cervical osteophytes Boluses Administered: Boluses Administered Boluses Administered: Thin liquids (Level 0); Mildly thick liquids (Level 2, nectar thick); Moderately thick liquids (Level 3, honey thick); Puree; Solid  Oral Impairment Domain: Oral Impairment Domain Lip Closure: No labial escape Tongue control during bolus hold: Cohesive bolus between tongue to palatal seal Bolus preparation/mastication: Timely and efficient chewing and mashing Bolus transport/lingual motion: Brisk tongue motion Oral residue: Complete oral clearance Location of oral residue : N/A Initiation of pharyngeal swallow : Valleculae  Pharyngeal Impairment Domain: Pharyngeal Impairment Domain Soft palate elevation: No bolus between soft palate (SP)/pharyngeal wall (PW) Laryngeal elevation: Complete superior movement of thyroid cartilage with complete approximation of arytenoids to  epiglottic petiole Anterior hyoid excursion: Complete anterior movement Epiglottic movement: Complete inversion Laryngeal vestibule closure: Complete, no air/contrast in laryngeal vestibule Pharyngeal stripping wave : Present - complete Pharyngeal contraction (A/P view only): N/A Pharyngoesophageal segment opening: Partial  distention/partial duration, partial obstruction of flow Tongue base retraction: No contrast between tongue base and posterior pharyngeal wall (PPW) Pharyngeal residue: Collection of residue within or on pharyngeal structures Location of pharyngeal residue: Valleculae; Pyriform sinuses; Tongue base  Esophageal Impairment Domain: Esophageal Impairment Domain Esophageal clearance upright position: Esophageal retention (trace) Pill: Esophageal Impairment Domain Esophageal clearance upright position: Esophageal retention (trace) Penetration/Aspiration Scale Score: Penetration/Aspiration Scale Score 1.  Material does not enter airway: Thin liquids (Level 0); Mildly thick liquids (Level 2, nectar thick); Moderately thick liquids (Level 3, honey thick); Puree; Solid; Pill Compensatory Strategies: Compensatory Strategies Compensatory strategies: No   General Information: Caregiver present: No  Diet Prior to this Study: Regular; Thin liquids (Level 0)   Temperature : Normal   Respiratory Status: WFL   Supplemental O2: None (Room air)   History of Recent Intubation: No  Behavior/Cognition: Alert; Cooperative; Pleasant mood Self-Feeding Abilities: Able to self-feed Baseline vocal quality/speech: Normal Volitional Cough: Able to elicit Volitional Swallow: Able to elicit No data recorded Goal Planning: Prognosis for improved oropharyngeal function: Good No data recorded No data recorded No data recorded Consulted and agree with results and recommendations: Patient; Family member/caregiver Pain: Pain Assessment Pain Assessment: No/denies pain Faces Pain Scale: 0 Pain Intervention(s): Monitored during session End of Session: Start Time:SLP Start Time (ACUTE ONLY): 1027 Stop Time: SLP Stop Time (ACUTE ONLY): 1038 Time Calculation:SLP Time Calculation (min) (ACUTE ONLY): 11 min Charges: SLP Evaluations $ SLP Speech Visit: 1 Visit SLP Evaluations $BSS Swallow: 1 Procedure $MBS Swallow: 1 Procedure SLP visit diagnosis: SLP Visit  Diagnosis: Dysphagia, pharyngoesophageal phase (R13.14) Past Medical History: Past Medical History: Diagnosis Date  Cancer (HCC)   Hypertension   Renal disorder   CRF Past Surgical History: Past Surgical History: Procedure Laterality Date  AV FISTULA PLACEMENT Left 05/04/2019  Procedure: Arteriovenous (Av) Fistula Creation Left Arm;  Surgeon: Larina Earthly, MD;  Location: [facility] OR;  Service: Vascular;  Laterality: Left;  BIOPSY  08/10/2019  Procedure: BIOPSY;  Surgeon: Lynann Bologna, MD;  Location: [facility] ENDOSCOPY;  Service: Endoscopy;;  CHOLECYSTECTOMY    COLONOSCOPY N/A 08/10/2019  Procedure: COLONOSCOPY;  Surgeon: Lynann Bologna, MD;  Location: [facility] ENDOSCOPY;  Service: Endoscopy;  Laterality: N/A;  EYE SURGERY Bilateral   cataract  LEFT HEART CATH AND CORONARY ANGIOGRAPHY N/A 06/19/2020  Procedure: LEFT HEART CATH AND CORONARY ANGIOGRAPHY;  Surgeon: Lyn Records, MD;  Location: [facility] INVASIVE CV LAB;  Service: Cardiovascular;  Laterality: N/A;  POLYPECTOMY  08/10/2019  Procedure: POLYPECTOMY;  Surgeon: Lynann Bologna, MD;  Location: [facility];  Service: Endoscopy;; [name] 04/08/2023, 11:21 AM  DG Chest 2 View  Result Date: 04/06/2023 CLINICAL DATA:  Shortness of breath EXAM: CHEST - 2 VIEW COMPARISON:  None Available. FINDINGS: The heart size and mediastinal contours are within normal limits. Both lungs are clear. The visualized skeletal structures are unremarkable. Prominent appearance of the costochondral margins again noted IMPRESSION: No active cardiopulmonary disease. Electronically Signed   By: Deatra Robinson M.D.   On: 04/06/2023 16:28    Microbiology: Results for orders placed or performed during the hospital encounter of 04/18/23  MRSA Next Gen by PCR, Nasal     Status: None   Collection Time: 04/19/23  3:01 AM   Specimen: Nasal Mucosa; Nasal  Swab  Result Value Ref Range Status   MRSA by PCR Next Gen NOT DETECTED NOT DETECTED Final    Comment: (NOTE) The GeneXpert MRSA Assay (FDA approved for  NASAL specimens only), is one component of a comprehensive MRSA colonization surveillance program. It is not intended to diagnose MRSA infection nor to guide or monitor treatment for MRSA infections. Test performance is not FDA approved in patients less than 5 years old. Performed at [facility], [address] [place], [place] [address]        Discharge time spent: greater than 30 minutes.  Signed: Jonah Blue, MD [facility] 04/21/2023

## 2023-04-21 NOTE — Plan of Care (Signed)
MRI brain personally reviewed, agree with radiology:   1. Aging brain without acute finding or specific cause for symptoms. 2. 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  Inpatient neurology will be available as needed, please reach out if any questions or concerns arise, discussed with primary team via secure chat.   Brooke Dare MD-PhD [facility] [phone] Available 7 AM to 7 PM, outside these hours please contact Neurologist on call listed on AMION

## 2023-04-21 NOTE — Procedures (Signed)
Patient seen and examined on Hemodialysis. The procedure was supervised and I have made appropriate changes. BP (!) 160/80 (BP Location: Right Arm)   Pulse 93   Temp 98 F (36.7 C) (Oral)   Resp 19   Ht 5\' 5"  (1.651 m)   Wt 47.3 kg   SpO2 97%   BMI 17.35 kg/m   QB 400 mL/ min via AVF, UF goal 2L  Tolerating treatment without complaints at this time.  Twitching is much better.     Bufford Buttner MD [facility] [phone] 12:12 PM

## 2023-04-21 NOTE — TOC Transition Note (Signed)
Transition of Care [facility]) - CM/SW Discharge Note   Patient Details  Name: Pedro Young MRN: [id number] Date of Birth: 1933/01/29  Transition of Care [facility] CM/SW Contact:  Tom-Johnson, Hershal Coria, RN Phone Number: 04/21/2023, 3:18 PM   Clinical Narrative:     Patient is scheduled for discharge today.  Readmission Risk Assessment done. Outpatient f/u, hospital f/u and discharge instructions on AVS. No TOC needs or recommendations noted. Daughter, Maureen Ralphs to transport at discharge.  No further TOC needs noted.       Final next level of care: Home/Self Care Barriers to Discharge: Barriers Resolved   Patient Goals and CMS Choice CMS Medicare.gov Compare Post Acute Care list provided to:: Patient Choice offered to / list presented to : NA  Discharge Placement                  Patient to be transferred to facility by: Daughter Name of family member notified: [facility]    Discharge Plan and Services Additional resources added to the After Visit Summary for                  DME Arranged: N/A DME Agency: NA       HH Arranged: PT, OT, Nurse's Aide (Resumption of care) HH Agency: [facility] Date [facility] Main Agency Contacted: 04/21/23 Time HH Agency Contacted: 1400 Representative spoke with at [facility] Agency: Kandee Keen  Social Determinants of Health (SDOH) Interventions SDOH Screenings   Food Insecurity: No Food Insecurity (04/18/2023)  Housing: Patient Declined (04/18/2023)  Transportation Needs: No Transportation Needs (04/18/2023)  Utilities: Not At Risk (04/18/2023)  Tobacco Use: Medium Risk (04/18/2023)     Readmission Risk Interventions    04/20/2023    2:37 PM 04/08/2023   10:49 AM  Readmission Risk Prevention Plan  Transportation Screening Complete Complete  PCP or Specialist Appt within 5-7 Days  Complete  PCP or Specialist Appt within 3-5 Days Complete   Home Care Screening  Complete  Medication Review (RN CM)  Complete  HRI or Home Care Consult  Complete   Social Work Consult for Recovery Care Planning/Counseling Complete   Palliative Care Screening Complete   Medication Review Oceanographer) Referral to Pharmacy

## 2023-04-21 NOTE — Progress Notes (Signed)
POST HD TX NOTE  04/21/23 1224  Vitals  Temp 97.9 F (36.6 C)  Temp Source Oral  BP (!) 167/82  MAP (mmHg) 108  BP Location Right Arm  BP Method Automatic  Patient Position (if appropriate) Lying  Pulse Rate 96  Pulse Rate Source Monitor  ECG Heart Rate 97  Resp 18  Oxygen Therapy  SpO2 99 %  O2 Device Room Air  Pulse Oximetry Type Continuous  During Treatment Monitoring  Intra-Hemodialysis Comments (S)   (post HD tx VS check)  Post Treatment  Dialyzer Clearance Lightly streaked  Duration of HD Treatment -hour(s) 3.5 hour(s)  Hemodialysis Intake (mL) 0 mL  Liters Processed 84  Fluid Removed (mL) 0 mL  Tolerated HD Treatment Yes  Post-Hemodialysis Comments (S)  tx completed w/o problem, UF goal of keep even met, blood rinsed back, VSS. Medication Admin: none  AVG/AVF Arterial Site Held (minutes) 8 minutes  AVG/AVF Venous Site Held (minutes) 8 minutes  Fistula / Graft Left Forearm Arteriovenous fistula  Placement Date/Time: 05/04/19 1137   Placed prior to admission: No  Orientation: Left  Access Location: Forearm  Access Type: Arteriovenous fistula  Site Condition No complications  Fistula / Graft Assessment Bruit;Thrill;Present  Drainage Description None

## 2023-04-21 NOTE — Evaluation (Signed)
Occupational Therapy Evaluation Patient Details Name: Pedro Young MRN: [id number] DOB: 12-01-1932 Today's Date: 04/21/2023   History of Present Illness 89 yo male presents to ED on 6/3 with fall, weakness, SOB. PMH includes HTN, CKD, preDM, CKD V with HD cath placed but not yet on HD.   Clinical Impression   Patient evaluated by Occupational Therapy with no further acute OT needs identified. Pt reports feeling back to baseline function with no reports of weakness as previously when admitted. All education has been completed and the patient has no further questions. Prior to admit, pt was living at home with daughter and wife (wife is on hospice services) and mod I for all BADL tasks. Utilizes RW or SPC as needed for functional mobility. No follow-up Occupational Therapy or equipment needs. OT is signing off. Thank you for this referral.       Recommendations for follow up therapy are one component of a multi-disciplinary discharge planning process, led by the attending physician.  Recommendations may be updated based on patient status, additional functional criteria and insurance authorization.   Assistance Recommended at Discharge PRN  Patient can return home with the following Assist for transportation;Help with stairs or ramp for entrance    Functional Status Assessment  Patient has not had a recent decline in their functional status  Equipment Recommendations  None recommended by OT       Precautions / Restrictions Precautions Precautions: Fall Restrictions Weight Bearing Restrictions: No      Mobility Bed Mobility Overal bed mobility:  (up in recliner upon therapy arrival)    Patient Response: Cooperative, Flat affect  Transfers Overall transfer level: Needs assistance Equipment used: None Transfers: Sit to/from Stand, Bed to chair/wheelchair/BSC Sit to Stand: Supervision     Step pivot transfers: Supervision     General transfer comment: no device used during  transfers. Pt verbalized that he felt like he wouldn't need to use RW. No LOB noted.      Balance Overall balance assessment: Mild deficits observed, not formally tested        ADL either performed or assessed with clinical judgement   ADL Overall ADL's : Modified independent        Vision Baseline Vision/History: 0 No visual deficits Ability to See in Adequate Light: 0 Adequate Patient Visual Report: No change from baseline Vision Assessment?: No apparent visual deficits     Perception     Praxis      Pertinent Vitals/Pain Pain Assessment Pain Assessment: No/denies pain Faces Pain Scale: No hurt     Hand Dominance Right   Extremity/Trunk Assessment Upper Extremity Assessment Upper Extremity Assessment: Overall WFL for tasks assessed   Lower Extremity Assessment Lower Extremity Assessment: Overall WFL for tasks assessed   Cervical / Trunk Assessment Cervical / Trunk Assessment: Normal   Communication Communication Communication: No difficulties   Cognition Arousal/Alertness: Awake/alert Behavior During Therapy: WFL for tasks assessed/performed Overall Cognitive Status: Within Functional Limits for tasks assessed         General Comments: Daughter present during session and translated in Spanish if English was not understood     General Comments  daughter present during session            Home Living Family/patient expects to be discharged to:: Private residence Living Arrangements: Spouse/significant other;Children (Wife is on Hospice) Available Help at Discharge: Family Type of Home: House Home Access: Stairs to enter Secretary/administrator of Steps: 1 Entrance Stairs-Rails: None Home Layout: One level  Bathroom Shower/Tub: Producer, television/film/video: Standard     Home Equipment: Cane - single Librarian, academic (2 wheels);Wheelchair - manual;Hospital bed;Grab bars - tub/shower;Shower seat   Additional Comments: equipment is  his wife's, but she is not currently using      Prior Functioning/Environment Prior Level of Function : Independent/Modified Independent             Mobility Comments: Independent with functional mobility; uses cane orRW for functional mobility as needed. ADLs Comments: typically independent/Mod I level        OT Problem List: Impaired balance (sitting and/or standing)         OT Goals(Current goals can be found in the care plan section) Acute Rehab OT Goals Patient Stated Goal: none stated  OT Frequency:  1X visit       AM-PAC OT "6 Clicks" Daily Activity     Outcome Measure Help from another person eating meals?: None Help from another person taking care of personal grooming?: None Help from another person toileting, which includes using toliet, bedpan, or urinal?: None Help from another person bathing (including washing, rinsing, drying)?: None Help from another person to put on and taking off regular upper body clothing?: None Help from another person to put on and taking off regular lower body clothing?: None 6 Click Score: 24   End of Session    Activity Tolerance: Patient tolerated treatment well Patient left: in chair;with call bell/phone within reach;with chair alarm set;with family/visitor present  OT Visit Diagnosis: Unsteadiness on feet (R26.81);Muscle weakness (generalized) (M62.81);Other symptoms and signs involving cognitive function                Time: 4098-1191 OT Time Calculation (min): 13 min Charges:  OT General Charges $OT Visit: 1 Visit OT Evaluation $OT Eval Low Complexity: 1 Low  Limmie Patricia, OTR/L,CBIS  Supplemental OT - [facility] and [facility] Secure Chat Preferred    Asheton Scheffler, Charisse March 04/21/2023, 3:40 PM

## 2023-04-21 NOTE — Progress Notes (Signed)
[facility] Progress Note   Subjective:  Seen in dialysis.  Doing much better today with twitching-- hardly any.     Objective Vitals:   04/21/23 1000 04/21/23 1030 04/21/23 1100 04/21/23 1130  BP: (!) 156/73 (!) 162/80 (!) 159/79 (!) 160/80  Pulse: 96 95 99 93  Resp: 15 16 16 19   Temp:      TempSrc:      SpO2: 100% 100% 100% 97%  Weight:      Height:       Physical Exam General: Frail man, lying in bed, nad  Heart: RRR; no murmur Lungs: CTA anteriorly Abdomen: soft Extremities: no LE edema Neuro: Oriented x 2. No real observed twitching today Dialysis Access:  L forearm AVF  Additional Objective Labs: Basic Metabolic Panel: Recent Labs  Lab 04/18/23 1756 04/19/23 0416 04/20/23 0435 04/21/23 0706  NA  --  131* 135 135  K  --  3.4* 4.3 3.6  CL  --  95* 96* 98  CO2  --  27 27 26   GLUCOSE  --  102* 88 86  BUN  --  21 35* 48*  CREATININE  --  1.93* 3.00* 3.60*  CALCIUM  --  7.5* 8.4* 8.0*  PHOS 3.1  --   --  3.6   Liver Function Tests: Recent Labs  Lab 04/18/23 1412 04/21/23 0706  AST 23  --   ALT 18  --   ALKPHOS 83  --   BILITOT 1.0  --   PROT 5.8*  --   ALBUMIN 2.5* 2.4*   CBC: Recent Labs  Lab 04/18/23 1412 04/19/23 0416 04/20/23 0435 04/21/23 0706  WBC 6.0 6.7 8.7 5.8  NEUTROABS 3.5  --  6.0  --   HGB 7.3* 8.2* 9.2* 9.1*  HCT 22.7* 24.7* 28.1* 27.3*  MCV 100.9* 99.2 100.4* 101.1*  PLT 175 147* 177 184   Studies/Results: MR BRAIN WO CONTRAST  Result Date: 04/21/2023 CLINICAL DATA:  Delirium EXAM: MRI HEAD WITHOUT CONTRAST TECHNIQUE: Multiplanar, multiecho pulse sequences of the brain and surrounding structures were obtained without intravenous contrast. COMPARISON:  Head CT from 2 days ago FINDINGS: Brain: No acute infarction, hemorrhage, hydrocephalus, or masslike finding. Generalized atrophy with mild for age chronic small vessel ischemia in the cerebral white matter. Subdural collection along the right frontal convexity  with slight FLAIR hyperintensity, chronic subdural hemorrhage when correlated with prior head CT, up to 5 mm in thickness but no mass effect. Vascular: Normal flow voids. Skull and upper cervical spine: Normal marrow signal. Sinuses/Orbits: Negative. IMPRESSION: 1. Aging brain without acute finding or specific cause for symptoms. 2. 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 Electronically Signed   By: Tiburcio Pea M.D.   On: 04/21/2023 05:26    Medications:  anticoagulant sodium citrate      lidocaine   Topical BID   And   capsaicin   Topical BID   rosuvastatin  40 mg Oral Daily   tamsulosin  0.4 mg Oral Daily    Dialysis Orders: SW TTS  3.5h  350/400   46.6kg   3K/2Ca bath   AVF  Heparin none - last HD 6/13, post wt 47.9kg - not on vdra or esa   Assessment/ Plan: Weakness/hypotension: On admit, HTN regimen reduced and was given IVF + 1U PRBCs.  Myoclonic jerking: Started after HD Saturday - similar to prior presentation when HD was started - presumed uremic at that time, not the case  now. Was given 1 dose gabapentin earlier - not the best choice for ESRD patient b/c caan confound issue and cause similar symptoms, but in is case - symtpoms preceded the med. Head CT without clear issue - chronic atrophy. Neuro and Palliative have been consulted by PMD - appreciate- it ESRD: Last dialyzed Sat with no UF.  Got more IVF.  Next HD 6/18. Keep even.  Acute on chronic anemia: S/p 1U PRBCs. Hgb 9.2 this AM. Hypotension: Volume ok. BP picking back up.  All BP meds on hold (amlod, metop, isosorb).  Secondary HPTH: CorrCa/Phos ok. CAD: On ranexa for presumed anginal symptoms - would hold HL: On high dose statin - would hold GOC: HD started 6/3 - has been a rough start. They would like to continue dialysis at this time and see  if his symptoms improve. Unclear cause of the myoclonic jerking. They do understand that hospice is an option for him as well -  wife currently in hospice at the family home - sad situation overall.  Bufford Buttner MD [facility] 04/21/2023,12:13 PM

## 2023-04-22 LAB — VITAMIN B1: Vitamin B1 (Thiamine): 130.3 nmol/L (ref 66.5–200.0)

## 2023-04-22 NOTE — Progress Notes (Signed)
Pt was d/c to home yesterday afternoon. Contacted [facility] this morning to advise clinic of pt's d/c date and that pt should resume care tomorrow.   Olivia Canter Renal Navigator [phone]

## 2023-04-22 NOTE — Plan of Care (Signed)
[facility] Patient Discharge Orders- [facility] CLINIC: [facility]  Patient's name: Pedro Young Admit/DC Dates: 04/18/2023 - 04/21/2023  Discharge Diagnoses: Myoclonus - resolved. Neuro w/u unrevealing. Likely medication related.  Hypotension - HTN meds adjusted. May need further adjustment.  Symptomatic anemia - transfused   Aranesp: Given: --   Date and amount of last dose: -- Last Hgb: 9.1 PRBC's Given: Yes Date/# of units: 6/18 1 Unit ESA dose for discharge: Mircera 30 mcg IV q 2 weeks  IV Iron dose at discharge: Venofer 100 mg IV x 5   Heparin change: ---  EDW Change: Yes New EDW: 47.5 kg   Bath Change: --  Access intervention/Change: -- Details:  Hectorol/Calcitriol change: --  Discharge Labs: Calcium 8.0 Phosphorus 3.6 Albumin 2.4 K+ 3.6  IV Antibiotics: --- Details:  On Coumadin?: --- Last INR: Next INR: Managed By:   OTHER/APPTS/LAB ORDERS: -Max UF 2L for now    D/C Meds to be reconciled by nurse after every discharge.  Completed By: Tomasa Blase PA-C [facility] 04/22/2023,11:55 AM   Reviewed by: MD:______ RN_______

## 2023-06-01 ENCOUNTER — Encounter: Payer: Medicare HMO | Attending: Nurse Practitioner | Admitting: Dietician

## 2023-06-01 ENCOUNTER — Encounter: Payer: Self-pay | Admitting: Dietician

## 2023-06-01 VITALS — Ht 64.0 in | Wt 117.0 lb

## 2023-06-01 DIAGNOSIS — N185 Chronic kidney disease, stage 5: Secondary | ICD-10-CM | POA: Diagnosis not present

## 2023-06-01 DIAGNOSIS — N186 End stage renal disease: Secondary | ICD-10-CM | POA: Insufficient documentation

## 2023-06-01 DIAGNOSIS — Z713 Dietary counseling and surveillance: Secondary | ICD-10-CM | POA: Diagnosis not present

## 2023-06-01 DIAGNOSIS — Z992 Dependence on renal dialysis: Secondary | ICD-10-CM | POA: Diagnosis not present

## 2023-06-01 NOTE — Progress Notes (Signed)
   Medical Nutrition Therapy  Appointment Start time:  1050  Appointment End time:  54 Patient is here today with his daughter.  He speaks [place] but daughter is interpreting as patient is forgetting [place].  Primary concerns today: His daughter would like to learn more about the diet for hemodialysis to help patient thrive.  Referral diagnosis: Stage 5 CKD on dialysis Preferred learning style: no preference indicated Learning readiness:  change in progress   NUTRITION ASSESSMENT   Anthropometrics  64" 117 lbs 06/01/2023  100 lbs 02/2023 UBW 117 lbs   Clinical Medical Hx: ESRD-Dialysis 3 times per week., HTN Medications: reviewed Labs: BUN 48, Creatinine 3.60, Potassium 3.6, eGFR 16, phosphorous 3.6 on 04/21/2023 Notable Signs/Symptoms: thin appearing  Lifestyle & Dietary Hx Patient's daughter lives with him as a caregiver.  His wife passed away 1 month ago.  They had been married for 70 years.  He no longer drives as he got lost the last time. Currently in home therapy to regain strength. He is from [place] and moved to [place] in 1969 and [place] 2006  Estimated daily fluid intake: 1200 ml Supplements: Ensure Max protein Current average weekly physical activity: home health therapy to regain strength  24-Hr Dietary Recall Goal per daughter:  weight gain and eating adequately First Meal: eggs, bread, oatmeal, coffee with 1/2 cup whole milk Snack: none Second Meal: meat, rice, potato Snack: none Third Meal: fruit, cereal with whole milk OR Ensure Max OR oatmeal Snack: none Beverages: water, coffee with 1/2 cup whole milk, Ensure Max, occasional clear soda Fluid restriction 5 cups daily  Estimated Energy Needs Calories: 1800-2000 Protein: 65-75 g  NUTRITION DIAGNOSIS  NB-1.1 Food and nutrition-related knowledge deficit As related to balance of calories, protein for hemodialysis.  As evidenced by patient and family report.   NUTRITION INTERVENTION  Nutrition education  (E-1) on the following topics:  Diet for hemodialysis Discussion of adequate calories to maintain/gain weight and increase strength Supplement option Resources on line  Handouts Provided Include  Copies of a choose a meal book with meal plan for dialysis  Learning Style & Readiness for Change Teaching method utilized: Visual & Auditory  Demonstrated degree of understanding via: Teach Back  Barriers to learning/adherence to lifestyle change: none  Goals Established by Pt [email or web address]  Continue following a diet appropriate for dialysis.  Adequate nutrition is very important.  You are having your labs monitored closely. The dialysis center will let you know if you need to be more strict in a certain area.  Supplement with a protein shake as recommended at your dialysis center.   MONITORING & EVALUATION Dietary intake, weekly physical activity prn  Next Steps  Patient is to call for questions.

## 2023-06-01 NOTE — Patient Instructions (Addendum)
[email or web address]  Continue following a diet appropriate for dialysis.  Adequate nutrition is very important.  You are having your labs monitored closely. The dialysis center will let you know if you need to be more strict in a certain area.  Supplement with a protein shake as recommended at your dialysis center.

## 2023-06-14 ENCOUNTER — Other Ambulatory Visit: Payer: Self-pay | Admitting: Physician Assistant

## 2023-06-21 ENCOUNTER — Other Ambulatory Visit: Payer: Self-pay | Admitting: Cardiovascular Disease

## 2023-06-29 ENCOUNTER — Other Ambulatory Visit: Payer: Self-pay | Admitting: Cardiovascular Disease

## 2023-07-20 ENCOUNTER — Telehealth: Payer: Self-pay | Admitting: Cardiovascular Disease

## 2023-07-20 ENCOUNTER — Ambulatory Visit: Payer: Medicare HMO | Attending: Cardiology | Admitting: Cardiology

## 2023-07-20 ENCOUNTER — Encounter: Payer: Self-pay | Admitting: Cardiology

## 2023-07-20 VITALS — BP 112/58 | HR 66 | Ht 64.0 in | Wt 117.0 lb

## 2023-07-20 DIAGNOSIS — N186 End stage renal disease: Secondary | ICD-10-CM

## 2023-07-20 DIAGNOSIS — R079 Chest pain, unspecified: Secondary | ICD-10-CM | POA: Diagnosis not present

## 2023-07-20 DIAGNOSIS — I10 Essential (primary) hypertension: Secondary | ICD-10-CM

## 2023-07-20 DIAGNOSIS — R5383 Other fatigue: Secondary | ICD-10-CM

## 2023-07-20 DIAGNOSIS — I2510 Atherosclerotic heart disease of native coronary artery without angina pectoris: Secondary | ICD-10-CM

## 2023-07-20 DIAGNOSIS — E782 Mixed hyperlipidemia: Secondary | ICD-10-CM

## 2023-07-20 NOTE — Telephone Encounter (Signed)
Pt c/o of Chest Pain: STAT if active CP, including tightness, pressure, jaw pain, radiating pain to shoulder/upper arm/back, CP unrelieved by Nitro. Symptoms reported of SOB, nausea, vomiting, sweating.  1. Are you having CP right now? No     2. Are you experiencing any other symptoms (ex. SOB, nausea, vomiting, sweating)? Fatigue    3. Is your CP continuous or coming and going? Coming and going    4. Have you taken Nitroglycerin? Yes    5. How long have you been experiencing CP? A week  Daughter states he complains the CP occurs when laying down at night.   The last time she is aware of the patient taking Nitroglycerin was Friday night, but she has not spoken with the patient since.   She reports he is on dialysis on Tuesday's and Thursday's.  She is wanting an appointment scheduled with Dr. Flora Lipps in regards to this.   Please advise.

## 2023-07-20 NOTE — Progress Notes (Signed)
ventriculography not performed.  RECOMMENDATIONS:   Outpatient status.  Spoke with Dr. Sabra Heck, [facility], who will take responsibility for follow-up creatinine in 3 days (at his recommendation), and will arrange it to be performed at his office.  Spoke with Dr. Lennie Odor who will follow up with the patient in 1 week.  We previously  discussed performing a basic metabolic panel in 48 hours, but after speaking with Dr. Marisue Humble, the follow-up blood work will be by [employer].  Increase medical therapy intensity.  If continued angina, CTO on RCA and possibly stenting the first obtuse marginal and distal circumflex if warranted by symptoms after the patient is on dialysis.  Total contrast 21 cc.  Findings Coronary Findings Diagnostic  Dominance: Right  Left Circumflex Mid Cx to Dist Cx lesion is 70% stenosed.  First Obtuse Marginal Branch 1st Mrg lesion is 70% stenosed.  Right Coronary Artery Dist RCA lesion is 100% stenosed.  Right Posterior Descending Artery Collaterals RPDA filled by collaterals from 2nd Sept.  Third Right Posterolateral Branch Collaterals 3rd RPL filled by collaterals from 3rd Mrg.  Intervention  No interventions have been documented.     ECHOCARDIOGRAM  ECHOCARDIOGRAM COMPLETE 08/01/2020  Narrative ECHOCARDIOGRAM REPORT    Patient Name:   Pedro Young Date of Exam: 08/01/2020 Medical Rec #:  [id number]       Height:       67.0 in Accession #:    [id number]      Weight:       123.0 lb Date of Birth:  12-03-1932        BSA:          1.645 m Patient Age:    89 years        BP:           75/120 mmHg Patient Gender: M               HR:           53 bpm. Exam Location:  [address]  Procedure: 2D Echo, 3D Echo, Cardiac Doppler, Color Doppler and Strain Analysis  Indications:    I25.118 CAD  History:        Patient has no prior history of Echocardiogram examinations. CAD and Occluded dRCA with left to right collaterals. 70% LCX disease, Signs/Symptoms:Chest Pain; Risk Factors:Hypertension and Former Smoker.  Sonographer:    Chanetta Marshall [facility], RDCS Referring Phys: [id number] Ronnald Ramp O'NEAL  IMPRESSIONS   1. Left ventricular ejection fraction by 3D volume is 66 %. The left ventricle has normal function. The left ventricle demonstrates regional wall  motion abnormalities (see scoring diagram/findings for description). There is mild left ventricular hypertrophy. Left ventricular diastolic parameters are consistent with Grade I diastolic dysfunction (impaired relaxation). The average left ventricular global longitudinal strain is -19.7 %. The global longitudinal strain is normal. 2. Right ventricular systolic function is normal. The right ventricular size is normal. There is normal pulmonary artery systolic pressure. The estimated right ventricular systolic pressure is 34.4 mmHg. 3. Left atrial size was mildly dilated. 4. The mitral valve is grossly normal. Mild mitral valve regurgitation. No evidence of mitral stenosis. 5. Tricuspid valve regurgitation is moderate. 6. The aortic valve is grossly normal. There is mild calcification of the aortic valve. There is mild thickening of the aortic valve. Aortic valve regurgitation is trivial. No aortic stenosis is present. 7. The inferior vena cava is dilated in size with >50% respiratory variability, suggesting right atrial pressure  ventriculography not performed.  RECOMMENDATIONS:   Outpatient status.  Spoke with Dr. Sabra Heck, [facility], who will take responsibility for follow-up creatinine in 3 days (at his recommendation), and will arrange it to be performed at his office.  Spoke with Dr. Lennie Odor who will follow up with the patient in 1 week.  We previously  discussed performing a basic metabolic panel in 48 hours, but after speaking with Dr. Marisue Humble, the follow-up blood work will be by [employer].  Increase medical therapy intensity.  If continued angina, CTO on RCA and possibly stenting the first obtuse marginal and distal circumflex if warranted by symptoms after the patient is on dialysis.  Total contrast 21 cc.  Findings Coronary Findings Diagnostic  Dominance: Right  Left Circumflex Mid Cx to Dist Cx lesion is 70% stenosed.  First Obtuse Marginal Branch 1st Mrg lesion is 70% stenosed.  Right Coronary Artery Dist RCA lesion is 100% stenosed.  Right Posterior Descending Artery Collaterals RPDA filled by collaterals from 2nd Sept.  Third Right Posterolateral Branch Collaterals 3rd RPL filled by collaterals from 3rd Mrg.  Intervention  No interventions have been documented.     ECHOCARDIOGRAM  ECHOCARDIOGRAM COMPLETE 08/01/2020  Narrative ECHOCARDIOGRAM REPORT    Patient Name:   Pedro Young Date of Exam: 08/01/2020 Medical Rec #:  [id number]       Height:       67.0 in Accession #:    [id number]      Weight:       123.0 lb Date of Birth:  12-03-1932        BSA:          1.645 m Patient Age:    89 years        BP:           75/120 mmHg Patient Gender: M               HR:           53 bpm. Exam Location:  [address]  Procedure: 2D Echo, 3D Echo, Cardiac Doppler, Color Doppler and Strain Analysis  Indications:    I25.118 CAD  History:        Patient has no prior history of Echocardiogram examinations. CAD and Occluded dRCA with left to right collaterals. 70% LCX disease, Signs/Symptoms:Chest Pain; Risk Factors:Hypertension and Former Smoker.  Sonographer:    Chanetta Marshall [facility], RDCS Referring Phys: [id number] Ronnald Ramp O'NEAL  IMPRESSIONS   1. Left ventricular ejection fraction by 3D volume is 66 %. The left ventricle has normal function. The left ventricle demonstrates regional wall  motion abnormalities (see scoring diagram/findings for description). There is mild left ventricular hypertrophy. Left ventricular diastolic parameters are consistent with Grade I diastolic dysfunction (impaired relaxation). The average left ventricular global longitudinal strain is -19.7 %. The global longitudinal strain is normal. 2. Right ventricular systolic function is normal. The right ventricular size is normal. There is normal pulmonary artery systolic pressure. The estimated right ventricular systolic pressure is 34.4 mmHg. 3. Left atrial size was mildly dilated. 4. The mitral valve is grossly normal. Mild mitral valve regurgitation. No evidence of mitral stenosis. 5. Tricuspid valve regurgitation is moderate. 6. The aortic valve is grossly normal. There is mild calcification of the aortic valve. There is mild thickening of the aortic valve. Aortic valve regurgitation is trivial. No aortic stenosis is present. 7. The inferior vena cava is dilated in size with >50% respiratory variability, suggesting right atrial pressure  Cardiology Office Note:  .   Date:  07/20/2023  ID:  [name], DOB August 12, 1933, MRN [id number] PCP: Maudie Flakes, FNP  [facility] Providers Cardiologist:  Reatha Harps, MD   History of Present Illness: .   Pedro Young is a 89 y.o. male with a past medical history of CAD, hypertension, BPH, end-stage renal disease on HD, hyperlipidemia.  Patient is followed by Dr. Flora Lipps and presents today for evaluation of chest pain.   Per chart review, patient previously underwent left heart catheterization 06/19/2020 that showed that the dominant RCA was totally occluded.  The distal RCA filled by left-to-right collaterals.  Left main was patent, there were luminal irregularities in the LAD but no significant obstructive disease.  Large first obtuse marginal contained 70-80% stenosis in the mid body.  The circumflex beyond the second small OM was 70-80% narrowed.  He was managed medically.  He later underwent echocardiogram on 08/01/2020 that showed EF 66% with regional wall motion abnormalities, grade 1 diastolic dysfunction, normal RV function, mild mitral valve regurgitation, moderate tricuspid valve regurgitation.  Patient was last seen by Dr. Bufford Buttner on 01/23/2023.  At that time, patient denied chest discomfort or shortness of breath.  At that time, patient was not on dialysis.  He denied significant anginal symptoms.  Remained on amlodipine, metoprolol, Imdur, Crestor.  Patient later started HD in 04/2023.   Today, patient's daughter called the office concerned that her father has been having chest pain.  He was scheduled for an appointment.  Patient presents to the office accompanied by his daughter.  I offered calling a translator service, but patient preferred that daughter translate for him.  Patient reports that his main concern is that he has been very tired lately.  Reports that he is so tired that he does not want to do much in his day-to-day life.  He believes that he is  sleeping okay and does not wake up frequently throughout the night.  He tends to feel better on days when he gets dialysis.  His daughter has been trying to get him to go for walks with her, but patient often refuses.  He has been very inactive recently and sometimes uses a wheelchair.  He denies syncope, near syncope, dizziness, palpitations, ankle edema.  It does not have pain in legs when walking.  He also complains of occasional chest pressure.  Chest pressure often occurs at night when he is laying in bed.  Occurs about 2 times per week.  Chest pressure has not been increasing in frequency or intensity.  Always goes away with 1 sublingual nitroglycerin.  Patient denies having chest pain or dyspnea on exertion.  Chest pressure exclusively occurs when he is at rest.  ROS: Patient complains of significant fatigue and low energy. Has chest pressure about 2 times per week when laying in bed. No shortness of breath, ankle edema, syncope, near syncope, dizziness, palpitations   Studies Reviewed: .   Cardiac Studies & Procedures   CARDIAC CATHETERIZATION  CARDIAC CATHETERIZATION 06/19/2020  Narrative  Dominant right coronary which is totally occluded distally.  The distal RCA fills by left to right collaterals from both LAD and circumflex territory.  Patent left main  Luminal irregularities in the LAD but no significant obstructive disease.  Large first obtuse marginal contains 70 to 80% stenosis in the mid body.  The continuation of the circumflex beyond a small second obtuse marginal is 70 to 80% narrowed.  LVEDP is 9 mmHg.  Left  ventriculography not performed.  RECOMMENDATIONS:   Outpatient status.  Spoke with Dr. Sabra Heck, [facility], who will take responsibility for follow-up creatinine in 3 days (at his recommendation), and will arrange it to be performed at his office.  Spoke with Dr. Lennie Odor who will follow up with the patient in 1 week.  We previously  discussed performing a basic metabolic panel in 48 hours, but after speaking with Dr. Marisue Humble, the follow-up blood work will be by [employer].  Increase medical therapy intensity.  If continued angina, CTO on RCA and possibly stenting the first obtuse marginal and distal circumflex if warranted by symptoms after the patient is on dialysis.  Total contrast 21 cc.  Findings Coronary Findings Diagnostic  Dominance: Right  Left Circumflex Mid Cx to Dist Cx lesion is 70% stenosed.  First Obtuse Marginal Branch 1st Mrg lesion is 70% stenosed.  Right Coronary Artery Dist RCA lesion is 100% stenosed.  Right Posterior Descending Artery Collaterals RPDA filled by collaterals from 2nd Sept.  Third Right Posterolateral Branch Collaterals 3rd RPL filled by collaterals from 3rd Mrg.  Intervention  No interventions have been documented.     ECHOCARDIOGRAM  ECHOCARDIOGRAM COMPLETE 08/01/2020  Narrative ECHOCARDIOGRAM REPORT    Patient Name:   Pedro Young Date of Exam: 08/01/2020 Medical Rec #:  [id number]       Height:       67.0 in Accession #:    [id number]      Weight:       123.0 lb Date of Birth:  12-03-1932        BSA:          1.645 m Patient Age:    89 years        BP:           75/120 mmHg Patient Gender: M               HR:           53 bpm. Exam Location:  [address]  Procedure: 2D Echo, 3D Echo, Cardiac Doppler, Color Doppler and Strain Analysis  Indications:    I25.118 CAD  History:        Patient has no prior history of Echocardiogram examinations. CAD and Occluded dRCA with left to right collaterals. 70% LCX disease, Signs/Symptoms:Chest Pain; Risk Factors:Hypertension and Former Smoker.  Sonographer:    Chanetta Marshall [facility], RDCS Referring Phys: [id number] Ronnald Ramp O'NEAL  IMPRESSIONS   1. Left ventricular ejection fraction by 3D volume is 66 %. The left ventricle has normal function. The left ventricle demonstrates regional wall  motion abnormalities (see scoring diagram/findings for description). There is mild left ventricular hypertrophy. Left ventricular diastolic parameters are consistent with Grade I diastolic dysfunction (impaired relaxation). The average left ventricular global longitudinal strain is -19.7 %. The global longitudinal strain is normal. 2. Right ventricular systolic function is normal. The right ventricular size is normal. There is normal pulmonary artery systolic pressure. The estimated right ventricular systolic pressure is 34.4 mmHg. 3. Left atrial size was mildly dilated. 4. The mitral valve is grossly normal. Mild mitral valve regurgitation. No evidence of mitral stenosis. 5. Tricuspid valve regurgitation is moderate. 6. The aortic valve is grossly normal. There is mild calcification of the aortic valve. There is mild thickening of the aortic valve. Aortic valve regurgitation is trivial. No aortic stenosis is present. 7. The inferior vena cava is dilated in size with >50% respiratory variability, suggesting right atrial pressure  Cardiology Office Note:  .   Date:  07/20/2023  ID:  [name], DOB August 12, 1933, MRN [id number] PCP: Maudie Flakes, FNP  [facility] Providers Cardiologist:  Reatha Harps, MD   History of Present Illness: .   Pedro Young is a 89 y.o. male with a past medical history of CAD, hypertension, BPH, end-stage renal disease on HD, hyperlipidemia.  Patient is followed by Dr. Flora Lipps and presents today for evaluation of chest pain.   Per chart review, patient previously underwent left heart catheterization 06/19/2020 that showed that the dominant RCA was totally occluded.  The distal RCA filled by left-to-right collaterals.  Left main was patent, there were luminal irregularities in the LAD but no significant obstructive disease.  Large first obtuse marginal contained 70-80% stenosis in the mid body.  The circumflex beyond the second small OM was 70-80% narrowed.  He was managed medically.  He later underwent echocardiogram on 08/01/2020 that showed EF 66% with regional wall motion abnormalities, grade 1 diastolic dysfunction, normal RV function, mild mitral valve regurgitation, moderate tricuspid valve regurgitation.  Patient was last seen by Dr. Bufford Buttner on 01/23/2023.  At that time, patient denied chest discomfort or shortness of breath.  At that time, patient was not on dialysis.  He denied significant anginal symptoms.  Remained on amlodipine, metoprolol, Imdur, Crestor.  Patient later started HD in 04/2023.   Today, patient's daughter called the office concerned that her father has been having chest pain.  He was scheduled for an appointment.  Patient presents to the office accompanied by his daughter.  I offered calling a translator service, but patient preferred that daughter translate for him.  Patient reports that his main concern is that he has been very tired lately.  Reports that he is so tired that he does not want to do much in his day-to-day life.  He believes that he is  sleeping okay and does not wake up frequently throughout the night.  He tends to feel better on days when he gets dialysis.  His daughter has been trying to get him to go for walks with her, but patient often refuses.  He has been very inactive recently and sometimes uses a wheelchair.  He denies syncope, near syncope, dizziness, palpitations, ankle edema.  It does not have pain in legs when walking.  He also complains of occasional chest pressure.  Chest pressure often occurs at night when he is laying in bed.  Occurs about 2 times per week.  Chest pressure has not been increasing in frequency or intensity.  Always goes away with 1 sublingual nitroglycerin.  Patient denies having chest pain or dyspnea on exertion.  Chest pressure exclusively occurs when he is at rest.  ROS: Patient complains of significant fatigue and low energy. Has chest pressure about 2 times per week when laying in bed. No shortness of breath, ankle edema, syncope, near syncope, dizziness, palpitations   Studies Reviewed: .   Cardiac Studies & Procedures   CARDIAC CATHETERIZATION  CARDIAC CATHETERIZATION 06/19/2020  Narrative  Dominant right coronary which is totally occluded distally.  The distal RCA fills by left to right collaterals from both LAD and circumflex territory.  Patent left main  Luminal irregularities in the LAD but no significant obstructive disease.  Large first obtuse marginal contains 70 to 80% stenosis in the mid body.  The continuation of the circumflex beyond a small second obtuse marginal is 70 to 80% narrowed.  LVEDP is 9 mmHg.  Left

## 2023-07-20 NOTE — Patient Instructions (Signed)
Medication Instructions:  The current medical regimen is effective;  continue present plan and medications as directed. Please refer to the Current Medication list given to you today.  *If you need a refill on your cardiac medications before your next appointment, please call your pharmacy*  Lab Work: CBC, BMET, TSH AND FREE T4  If you have labs (blood work) drawn today and your tests are completely normal, you will receive your results only by: MyChart Message (if you have MyChart) OR A paper copy in the mail If you have any lab test that is abnormal or we need to change your treatment, we will call you to review the results.  Testing/Procedures: Your physician has requested that you have a lexiscan myoview. For further information please visit . Please follow instruction sheet, as given.  [email or web address]  Follow-Up: At [facility], you and your health needs are our priority.  As part of our continuing mission to provide you with exceptional heart care, we have created designated Provider Care Teams.  These Care Teams include your primary Cardiologist (physician) and Advanced Practice Providers (APPs -  Physician Assistants and Nurse Practitioners) who all work together to provide you with the care you need, when you need it.  We recommend signing up for the patient portal called "MyChart".  Sign up information is provided on this After Visit Summary.  MyChart is used to connect with patients for Virtual Visits (Telemedicine).  Patients are able to view lab/test results, encounter notes, upcoming appointments, etc.  Non-urgent messages can be sent to your provider as well.   To learn more about what you can do with MyChart, go to [email or web address].    Your next appointment:   6-8 week(s)  Provider:   Reatha Harps, MD  or Robet Leu, PA-C       How to Prepare for Your Myoview Test (stress test):  Please do not take your metoprolol tartrate (LOPRESSOR) 25 MG  tablet  before your test. Your remaining medications may be taken with water. Nothing to eat or drink, except water, 4 hours prior to arrival time.  NO caffeine/decaffeinated products, or chocolate 12 hours prior to arrival. [place], please do not wear dresses.  Skirts or pants are approprate, please wear a short sleeve shirt. NO perfume, cologne or lotion Wear comfortable walking shoes.  NO HEELS! Total time is 3 to 4 hours; you may want to bring reading material for the waiting time. Please report to [facility] for your test  What to expect after you arrive:  Once you arrive and check in for your appointment an IV will be started in your arm.  Then the Technoligist will inject a small amount of radioactive tracer.  There will be a 1 hour waiting period after this injection.  A series of pictures will be taken of your heart following this waiting period.  You will be prepped for the stress portion of the test.  During the stress portion of your test you will either walk on a treadmill or receive a small, safe amount of radioactive tracer injected in your IV.  After the stress portion, there is a short rest period during which time your heart and blood pressure will be monitored.  After the short rest period the Technologist will begin your second set of pictures.  Your doctor will inform you of your test results within 7-10 business days.  In preparation for your appointment, medication and supplies will be purchased.  Appointment availability is limited,  so if you need to cancel or reschedule please call the office at [phone] 24 hours in advance to avoid a cancellation fee of $100.00  IF YOU THINK YOU MAY BE PREGNANT, PLEASE INFORM THE TECHNOLOGIST.

## 2023-07-21 ENCOUNTER — Telehealth: Payer: Self-pay

## 2023-07-21 LAB — BASIC METABOLIC PANEL
BUN/Creatinine Ratio: 16 (ref 10–24)
BUN: 95 mg/dL (ref 8–27)
CO2: 22 mmol/L (ref 20–29)
Calcium: 8.7 mg/dL (ref 8.6–10.2)
Chloride: 97 mmol/L (ref 96–106)
Creatinine, Ser: 5.89 mg/dL — ABNORMAL HIGH (ref 0.76–1.27)
Glucose: 120 mg/dL — ABNORMAL HIGH (ref 70–99)
Potassium: 5.7 mmol/L — ABNORMAL HIGH (ref 3.5–5.2)
Sodium: 139 mmol/L (ref 134–144)
eGFR: 9 mL/min/{1.73_m2} — ABNORMAL LOW (ref >59)

## 2023-07-21 LAB — CBC
Hematocrit: 36.2 % — ABNORMAL LOW (ref 37.5–51.0)
Hemoglobin: 11.8 g/dL — ABNORMAL LOW (ref 13.0–17.7)
MCH: 32.1 pg (ref 26.6–33.0)
MCHC: 32.6 g/dL (ref 31.5–35.7)
MCV: 98 fL — ABNORMAL HIGH (ref 79–97)
Platelets: 156 10*3/uL (ref 150–450)
RBC: 3.68 x10E6/uL — ABNORMAL LOW (ref 4.14–5.80)
RDW: 13 % (ref 11.6–15.4)
WBC: 7.5 10*3/uL (ref 3.4–10.8)

## 2023-07-21 LAB — TSH: TSH: 5.45 u[IU]/mL — ABNORMAL HIGH (ref 0.450–4.500)

## 2023-07-21 LAB — T4, FREE: Free T4: 0.98 ng/dL (ref 0.82–1.77)

## 2023-07-21 NOTE — Telephone Encounter (Signed)
Called pt to inform him of results and OV comments. Pt daughter vivian answered phone. Maureen Ralphs was not listed as a contact on pt charts. Informed her I would try to call back again or to have pt or authorized contact call the office back to discuss results.

## 2023-07-21 NOTE — Telephone Encounter (Signed)
-----   Message from Jonita Albee sent at 07/21/2023 11:29 AM EDT ----- Patient does not have mychart- I attempted to call to discuss lab results, but patient was unavailable. I left voicemail and instructed them to call the office   Labs show that his creatinine, BUN, and potassium were all significantly elevated. He is scheduled for HD on Tuesdays, so please make sure that he went to his dialysis session today. Dialysis is the best way to correct his potassium and keep him healthy overall.   His hemoglobin and RBC counts were stable, indicating that anemia is not the cause of his fatigue. WBC normal which rules out infection. His TSH was a bit elevated, but the actual thyroid hormone itself was within normal limits. He should follow up with his PCP to discuss further, but unlikely he will need treatment.   Thanks [employer]

## 2023-07-22 ENCOUNTER — Telehealth (HOSPITAL_COMMUNITY): Payer: Self-pay | Admitting: *Deleted

## 2023-07-22 NOTE — Telephone Encounter (Signed)
Patient' daughter given detailed instructions per Myocardial Perfusion Study Information Sheet for the test on 07/27/2023 at 8:00. Patient notified to arrive 15 minutes early and that it is imperative to arrive on time for appointment to keep from having the test rescheduled.  If you need to cancel or reschedule your appointment, please call the office within 24 hours of your appointment. . Patient verbalized understanding.Daneil Dolin

## 2023-07-27 ENCOUNTER — Ambulatory Visit (HOSPITAL_COMMUNITY): Payer: Medicare HMO | Attending: Cardiology

## 2023-07-27 DIAGNOSIS — R079 Chest pain, unspecified: Secondary | ICD-10-CM | POA: Insufficient documentation

## 2023-07-27 MED ORDER — REGADENOSON 0.4 MG/5ML IV SOLN
0.4000 mg | Freq: Once | INTRAVENOUS | Status: AC
Start: 2023-07-27 — End: 2023-07-27
  Administered 2023-07-27: 0.4 mg via INTRAVENOUS

## 2023-07-27 MED ORDER — TECHNETIUM TC 99M TETROFOSMIN IV KIT
32.3000 | PACK | Freq: Once | INTRAVENOUS | Status: AC | PRN
  Administered 2023-07-27: 32.3 via INTRAVENOUS

## 2023-07-27 MED ORDER — TECHNETIUM TC 99M TETROFOSMIN IV KIT
10.1000 | PACK | Freq: Once | INTRAVENOUS | Status: AC | PRN
  Administered 2023-07-27: 10.1 via INTRAVENOUS

## 2023-07-28 LAB — MYOCARDIAL PERFUSION IMAGING
LV dias vol: 61 mL (ref 62–150)
LV sys vol: 20 mL
Nuc Stress EF: 66 %
Peak HR: 81 {beats}/min
Rest HR: 68 {beats}/min
Rest Nuclear Isotope Dose: 10.1 mCi
SDS: 1
SRS: 0
SSS: 1
ST Depression (mm): 0 mm
Stress Nuclear Isotope Dose: 32.3 mCi
TID: 0.98

## 2023-07-30 ENCOUNTER — Telehealth: Payer: Self-pay

## 2023-07-30 NOTE — Telephone Encounter (Signed)
-----   Message from Pedro Young sent at 07/29/2023  5:25 PM EDT ----- Please inform patient that his recent stress test was a normal, low-risk study. LV function is normal. There was a small defect present, but it improved with correction images so it is likely artifact (a flaw in the images). Overall, this is good news!  Thanks [employer]

## 2023-07-30 NOTE — Telephone Encounter (Signed)
Called patient. Spoke with daughter vivian (approved to receive HI) and informed her of the stress test results. Verbalized understanding. No questions or concerns expressed at this time.

## 2023-08-22 NOTE — Progress Notes (Signed)
**Note Pedro-Identified via Obfuscation** Cardiology Office Note:  .   Date:  08/31/2023  ID:  Pedro Young, DOB 1932/11/29, MRN [id number] PCP: Pedro Flakes, FNP  [facility] Providers Cardiologist:  Pedro Harps, MD   History of Present Illness: .   Pedro Young is a 89 y.o. male  with a past medical history of CAD, hypertension, BPH, end-stage renal disease on HD, hyperlipidemia.  Patient is followed by Dr. Flora Young and presents today for a follow up appointment.    Per chart review, patient previously underwent left heart catheterization 06/19/2020 that showed that the dominant RCA was totally occluded.  The distal RCA filled by left-to-right collaterals.  Left main was patent, there were luminal irregularities in the LAD but no significant obstructive disease.  Large first obtuse marginal contained 70-80% stenosis in the mid body.  The circumflex beyond the second small OM was 70-80% narrowed.  He was managed medically.  He later underwent echocardiogram on 08/01/2020 that showed EF 66% with regional wall motion abnormalities, grade 1 diastolic dysfunction, normal RV function, mild mitral valve regurgitation, moderate tricuspid valve regurgitation.   Patient was last seen by Dr. Flora Young on 01/23/2023.  When seen, he denied chest discomfort or shortness of breath.  At that time, patient was not on dialysis.  He denied significant anginal symptoms.  Remained on amlodipine, metoprolol, Imdur, Crestor.  Patient later started HD in 04/2023.   I saw patient in clinic on 07/20/2023.  At that time, patient reported fatigue, intermittent chest pressure.  Chest pressure occurred about 2 times per week and had been improving with nitroglycerin.  I ordered a nuclear stress test that was completed 07/27/2023 and was a normal, low risk study with estimated EF 66%.  Today, patient's main complaint is ankle pain, suspected to be gout triggered by dietary intake. He also reports occasional chest pressure, which he has been managing with  daily nitroglycerin, despite the medication being intended for as-needed use. He often takes SL nitroglycerin when he does not have chest pain because he thought it was a daily medication. The frequency of his chest pain is variable, with the patient reporting periods of up to two weeks without symptoms. He had a nuclear stress completed on 9/23, which was normal and low risk. Has not had any changes in frequency or intensity of chest pain since that time.   The patient has not been taking prescribed medications including Imdur, lasix, and Crestor due to a lack of clear instructions. Reports that "no one told him too". He has been taking Tylenol (acetaminophen) before dialysis sessions, a practice that is not universally recommended and may not be necessary as the patient reports no pain or headaches associated with dialysis.  The patient's breathing is reported to be good, and he has not been experiencing symptoms such as shortness of breath. He has been adhering to his dialysis schedule, with sessions on Tuesday, Thursday, and Saturday, and reports no significant issues apart from a recent incident where the needle dislodged. Denies GI bleeding, blood in urine. Denies dizziness, syncope, near syncope.   ROS: Per HPI, otherwise negative   Studies Reviewed: .    Cardiac Studies & Procedures   CARDIAC CATHETERIZATION  CARDIAC CATHETERIZATION 06/19/2020  Narrative  Dominant right coronary which is totally occluded distally.  The distal RCA fills by left to right collaterals from both LAD and circumflex territory.  Patent left main  Luminal irregularities in the LAD but no significant obstructive disease.  Large first obtuse marginal contains  70 to 80% stenosis in the mid body.  The continuation of the circumflex beyond a small second obtuse marginal is 70 to 80% narrowed.  LVEDP is 9 mmHg.  Left ventriculography not performed.  RECOMMENDATIONS:   Outpatient status.  Spoke with Pedro Young, [facility], who will take responsibility for follow-up creatinine in 3 days (at his recommendation), and will arrange it to be performed at his office.  Spoke with Pedro Young who will follow up with the patient in 1 week.  We previously discussed performing a basic metabolic panel in 48 hours, but after speaking with Pedro Young, the follow-up blood work will be by [employer].  Increase medical therapy intensity.  If continued angina, CTO on RCA and possibly stenting the first obtuse marginal and distal circumflex if warranted by symptoms after the patient is on dialysis.  Total contrast 21 cc.  Findings Coronary Findings Diagnostic  Dominance: Right  Left Circumflex Mid Cx to Dist Cx lesion is 70% stenosed.  First Obtuse Marginal Branch 1st Mrg lesion is 70% stenosed.  Right Coronary Artery Dist RCA lesion is 100% stenosed.  Right Posterior Descending Artery Collaterals RPDA filled by collaterals from 2nd Sept.  Third Right Posterolateral Branch Collaterals 3rd RPL filled by collaterals from 3rd Mrg.  Intervention  No interventions have been documented.   STRESS TESTS  MYOCARDIAL PERFUSION IMAGING 07/27/2023  Interpretation Summary   The study is normal. The study is low risk.   No ST deviation was noted.   Left ventricular function is normal. Nuclear stress EF: 66%. The left ventricular ejection fraction is hyperdynamic (>65%). End diastolic cavity size is normal.   Prior study not available for comparison.  Small  defect with mild intensity in the inferior wall with rest, worse with stress. However, it appears to improve with gut attenuation correction images. I suspect this is artifact   ECHOCARDIOGRAM  ECHOCARDIOGRAM COMPLETE 08/01/2020  Narrative ECHOCARDIOGRAM REPORT    Patient Name:   Pedro Young Date of Exam: 08/01/2020 Medical Rec #:  [id number]       Height:       67.0 in Accession #:    [id number]       Weight:       123.0 lb Date of Birth:  01-19-1933        BSA:          1.645 m Patient Age:    87 years        BP:           75/120 mmHg Patient Gender: M               HR:           53 bpm. Exam Location:  [address]  Procedure: 2D Echo, 3D Echo, Cardiac Doppler, Color Doppler and Strain Analysis  Indications:    I25.118 CAD  History:        Patient has no prior history of Echocardiogram examinations. CAD and Occluded dRCA with left to right collaterals. 70% LCX disease, Signs/Symptoms:Chest Pain; Risk Factors:Hypertension and Former Smoker.  Sonographer:    Chanetta Marshall [facility], RDCS Referring Phys: [id number] Ronnald Ramp O'NEAL  IMPRESSIONS   1. Left ventricular ejection fraction by 3D volume is 66 %. The left ventricle has normal function. The left ventricle demonstrates regional wall motion abnormalities (see scoring diagram/findings for description). There is mild left ventricular hypertrophy. Left ventricular diastolic parameters are consistent with Grade I diastolic dysfunction (impaired relaxation).  70 to 80% stenosis in the mid body.  The continuation of the circumflex beyond a small second obtuse marginal is 70 to 80% narrowed.  LVEDP is 9 mmHg.  Left ventriculography not performed.  RECOMMENDATIONS:   Outpatient status.  Spoke with Pedro Young, [facility], who will take responsibility for follow-up creatinine in 3 days (at his recommendation), and will arrange it to be performed at his office.  Spoke with Pedro Young who will follow up with the patient in 1 week.  We previously discussed performing a basic metabolic panel in 48 hours, but after speaking with Pedro Young, the follow-up blood work will be by [employer].  Increase medical therapy intensity.  If continued angina, CTO on RCA and possibly stenting the first obtuse marginal and distal circumflex if warranted by symptoms after the patient is on dialysis.  Total contrast 21 cc.  Findings Coronary Findings Diagnostic  Dominance: Right  Left Circumflex Mid Cx to Dist Cx lesion is 70% stenosed.  First Obtuse Marginal Branch 1st Mrg lesion is 70% stenosed.  Right Coronary Artery Dist RCA lesion is 100% stenosed.  Right Posterior Descending Artery Collaterals RPDA filled by collaterals from 2nd Sept.  Third Right Posterolateral Branch Collaterals 3rd RPL filled by collaterals from 3rd Mrg.  Intervention  No interventions have been documented.   STRESS TESTS  MYOCARDIAL PERFUSION IMAGING 07/27/2023  Interpretation Summary   The study is normal. The study is low risk.   No ST deviation was noted.   Left ventricular function is normal. Nuclear stress EF: 66%. The left ventricular ejection fraction is hyperdynamic (>65%). End diastolic cavity size is normal.   Prior study not available for comparison.  Small  defect with mild intensity in the inferior wall with rest, worse with stress. However, it appears to improve with gut attenuation correction images. I suspect this is artifact   ECHOCARDIOGRAM  ECHOCARDIOGRAM COMPLETE 08/01/2020  Narrative ECHOCARDIOGRAM REPORT    Patient Name:   Pedro Young Date of Exam: 08/01/2020 Medical Rec #:  [id number]       Height:       67.0 in Accession #:    [id number]       Weight:       123.0 lb Date of Birth:  01-19-1933        BSA:          1.645 m Patient Age:    87 years        BP:           75/120 mmHg Patient Gender: M               HR:           53 bpm. Exam Location:  [address]  Procedure: 2D Echo, 3D Echo, Cardiac Doppler, Color Doppler and Strain Analysis  Indications:    I25.118 CAD  History:        Patient has no prior history of Echocardiogram examinations. CAD and Occluded dRCA with left to right collaterals. 70% LCX disease, Signs/Symptoms:Chest Pain; Risk Factors:Hypertension and Former Smoker.  Sonographer:    Chanetta Marshall [facility], RDCS Referring Phys: [id number] Ronnald Ramp O'NEAL  IMPRESSIONS   1. Left ventricular ejection fraction by 3D volume is 66 %. The left ventricle has normal function. The left ventricle demonstrates regional wall motion abnormalities (see scoring diagram/findings for description). There is mild left ventricular hypertrophy. Left ventricular diastolic parameters are consistent with Grade I diastolic dysfunction (impaired relaxation).  Cardiology Office Note:  .   Date:  08/31/2023  ID:  Pedro Young, DOB 1932/11/29, MRN [id number] PCP: Pedro Flakes, FNP  [facility] Providers Cardiologist:  Pedro Harps, MD   History of Present Illness: .   Pedro Young is a 89 y.o. male  with a past medical history of CAD, hypertension, BPH, end-stage renal disease on HD, hyperlipidemia.  Patient is followed by Dr. Flora Young and presents today for a follow up appointment.    Per chart review, patient previously underwent left heart catheterization 06/19/2020 that showed that the dominant RCA was totally occluded.  The distal RCA filled by left-to-right collaterals.  Left main was patent, there were luminal irregularities in the LAD but no significant obstructive disease.  Large first obtuse marginal contained 70-80% stenosis in the mid body.  The circumflex beyond the second small OM was 70-80% narrowed.  He was managed medically.  He later underwent echocardiogram on 08/01/2020 that showed EF 66% with regional wall motion abnormalities, grade 1 diastolic dysfunction, normal RV function, mild mitral valve regurgitation, moderate tricuspid valve regurgitation.   Patient was last seen by Dr. Flora Young on 01/23/2023.  When seen, he denied chest discomfort or shortness of breath.  At that time, patient was not on dialysis.  He denied significant anginal symptoms.  Remained on amlodipine, metoprolol, Imdur, Crestor.  Patient later started HD in 04/2023.   I saw patient in clinic on 07/20/2023.  At that time, patient reported fatigue, intermittent chest pressure.  Chest pressure occurred about 2 times per week and had been improving with nitroglycerin.  I ordered a nuclear stress test that was completed 07/27/2023 and was a normal, low risk study with estimated EF 66%.  Today, patient's main complaint is ankle pain, suspected to be gout triggered by dietary intake. He also reports occasional chest pressure, which he has been managing with  daily nitroglycerin, despite the medication being intended for as-needed use. He often takes SL nitroglycerin when he does not have chest pain because he thought it was a daily medication. The frequency of his chest pain is variable, with the patient reporting periods of up to two weeks without symptoms. He had a nuclear stress completed on 9/23, which was normal and low risk. Has not had any changes in frequency or intensity of chest pain since that time.   The patient has not been taking prescribed medications including Imdur, lasix, and Crestor due to a lack of clear instructions. Reports that "no one told him too". He has been taking Tylenol (acetaminophen) before dialysis sessions, a practice that is not universally recommended and may not be necessary as the patient reports no pain or headaches associated with dialysis.  The patient's breathing is reported to be good, and he has not been experiencing symptoms such as shortness of breath. He has been adhering to his dialysis schedule, with sessions on Tuesday, Thursday, and Saturday, and reports no significant issues apart from a recent incident where the needle dislodged. Denies GI bleeding, blood in urine. Denies dizziness, syncope, near syncope.   ROS: Per HPI, otherwise negative   Studies Reviewed: .    Cardiac Studies & Procedures   CARDIAC CATHETERIZATION  CARDIAC CATHETERIZATION 06/19/2020  Narrative  Dominant right coronary which is totally occluded distally.  The distal RCA fills by left to right collaterals from both LAD and circumflex territory.  Patent left main  Luminal irregularities in the LAD but no significant obstructive disease.  Large first obtuse marginal contains  70 to 80% stenosis in the mid body.  The continuation of the circumflex beyond a small second obtuse marginal is 70 to 80% narrowed.  LVEDP is 9 mmHg.  Left ventriculography not performed.  RECOMMENDATIONS:   Outpatient status.  Spoke with Pedro Young, [facility], who will take responsibility for follow-up creatinine in 3 days (at his recommendation), and will arrange it to be performed at his office.  Spoke with Pedro Young who will follow up with the patient in 1 week.  We previously discussed performing a basic metabolic panel in 48 hours, but after speaking with Pedro Young, the follow-up blood work will be by [employer].  Increase medical therapy intensity.  If continued angina, CTO on RCA and possibly stenting the first obtuse marginal and distal circumflex if warranted by symptoms after the patient is on dialysis.  Total contrast 21 cc.  Findings Coronary Findings Diagnostic  Dominance: Right  Left Circumflex Mid Cx to Dist Cx lesion is 70% stenosed.  First Obtuse Marginal Branch 1st Mrg lesion is 70% stenosed.  Right Coronary Artery Dist RCA lesion is 100% stenosed.  Right Posterior Descending Artery Collaterals RPDA filled by collaterals from 2nd Sept.  Third Right Posterolateral Branch Collaterals 3rd RPL filled by collaterals from 3rd Mrg.  Intervention  No interventions have been documented.   STRESS TESTS  MYOCARDIAL PERFUSION IMAGING 07/27/2023  Interpretation Summary   The study is normal. The study is low risk.   No ST deviation was noted.   Left ventricular function is normal. Nuclear stress EF: 66%. The left ventricular ejection fraction is hyperdynamic (>65%). End diastolic cavity size is normal.   Prior study not available for comparison.  Small  defect with mild intensity in the inferior wall with rest, worse with stress. However, it appears to improve with gut attenuation correction images. I suspect this is artifact   ECHOCARDIOGRAM  ECHOCARDIOGRAM COMPLETE 08/01/2020  Narrative ECHOCARDIOGRAM REPORT    Patient Name:   Pedro Young Date of Exam: 08/01/2020 Medical Rec #:  [id number]       Height:       67.0 in Accession #:    [id number]       Weight:       123.0 lb Date of Birth:  01-19-1933        BSA:          1.645 m Patient Age:    87 years        BP:           75/120 mmHg Patient Gender: M               HR:           53 bpm. Exam Location:  [address]  Procedure: 2D Echo, 3D Echo, Cardiac Doppler, Color Doppler and Strain Analysis  Indications:    I25.118 CAD  History:        Patient has no prior history of Echocardiogram examinations. CAD and Occluded dRCA with left to right collaterals. 70% LCX disease, Signs/Symptoms:Chest Pain; Risk Factors:Hypertension and Former Smoker.  Sonographer:    Chanetta Marshall [facility], RDCS Referring Phys: [id number] Ronnald Ramp O'NEAL  IMPRESSIONS   1. Left ventricular ejection fraction by 3D volume is 66 %. The left ventricle has normal function. The left ventricle demonstrates regional wall motion abnormalities (see scoring diagram/findings for description). There is mild left ventricular hypertrophy. Left ventricular diastolic parameters are consistent with Grade I diastolic dysfunction (impaired relaxation).

## 2023-08-28 ENCOUNTER — Other Ambulatory Visit: Payer: Self-pay | Admitting: Cardiovascular Disease

## 2023-08-31 ENCOUNTER — Encounter: Payer: Self-pay | Admitting: Cardiology

## 2023-08-31 ENCOUNTER — Ambulatory Visit: Payer: Medicare HMO | Attending: Cardiology | Admitting: Cardiology

## 2023-08-31 VITALS — BP 128/64 | HR 81 | Ht 64.0 in | Wt 122.4 lb

## 2023-08-31 DIAGNOSIS — R079 Chest pain, unspecified: Secondary | ICD-10-CM

## 2023-08-31 DIAGNOSIS — I10 Essential (primary) hypertension: Secondary | ICD-10-CM | POA: Diagnosis not present

## 2023-08-31 DIAGNOSIS — Z91148 Patient's other noncompliance with medication regimen for other reason: Secondary | ICD-10-CM

## 2023-08-31 DIAGNOSIS — N186 End stage renal disease: Secondary | ICD-10-CM | POA: Diagnosis not present

## 2023-08-31 DIAGNOSIS — I2510 Atherosclerotic heart disease of native coronary artery without angina pectoris: Secondary | ICD-10-CM

## 2023-08-31 DIAGNOSIS — E782 Mixed hyperlipidemia: Secondary | ICD-10-CM

## 2023-08-31 MED ORDER — ROSUVASTATIN CALCIUM 40 MG PO TABS: 40.0000 mg | ORAL_TABLET | Freq: Every day | ORAL | 3 refills | Status: DC

## 2023-08-31 MED ORDER — ISOSORBIDE MONONITRATE ER 60 MG PO TB24: 60.0000 mg | ORAL_TABLET | Freq: Every day | ORAL | 3 refills | Status: DC

## 2023-08-31 MED ORDER — ASPIRIN 81 MG PO TBEC: 81.0000 mg | DELAYED_RELEASE_TABLET | Freq: Every day | ORAL | 3 refills | Status: DC

## 2023-08-31 NOTE — Patient Instructions (Signed)
Medication Instructions:  Start Aspirin 81 mg tablet once a day  Restart Imdur 60 mg tablet once a day Restart Crestor 40 mg tablet once a day *If you need a refill on your cardiac medications before your next appointment, please call your pharmacy*   Lab Work: No labs  Testing/Procedures: No testing   Follow-Up: At [facility], you and your health needs are our priority.  As part of our continuing mission to provide you with exceptional heart care, we have created designated Provider Care Teams.  These Care Teams include your primary Cardiologist (physician) and Advanced Practice Providers (APPs -  Physician Assistants and Nurse Practitioners) who all work together to provide you with the care you need, when you need it.  We recommend signing up for the patient portal called "MyChart".  Sign up information is provided on this After Visit Summary.  MyChart is used to connect with patients for Virtual Visits (Telemedicine).  Patients are able to view lab/test results, encounter notes, upcoming appointments, etc.  Non-urgent messages can be sent to your provider as well.   To learn more about what you can do with MyChart, go to [email or web address].    Your next appointment:   November 25th at 10:30 am with Robet Leu, PA-C  Other Instructions F/u Camille Bal MD for Nephrology

## 2023-09-19 NOTE — Progress Notes (Deleted)
Cardiology Office Note:  .   Date:  09/19/2023  ID:  [name], DOB 03-22-1933, MRN [id number] PCP: Maudie Flakes, FNP  [facility] Providers Cardiologist:  Reatha Harps, MD  History of Present Illness: .   Lydia Krumrey is a 89 y.o. male with a past medical history of CAD, hypertension, BPH, end-stage renal disease on HD, hyperlipidemia.  Patient is followed by Dr. Flora Lipps and presents today for a follow up appointment.    Per chart review, patient previously underwent left heart catheterization 06/19/2020 that showed that the dominant RCA was totally occluded.  The distal RCA filled by left-to-right collaterals.  Left main was patent, there were luminal irregularities in the LAD but no significant obstructive disease.  Large first obtuse marginal contained 70-80% stenosis in the mid body.  The circumflex beyond the second small OM was 70-80% narrowed.  He was managed medically.  He later underwent echocardiogram on 08/01/2020 that showed EF 66% with regional wall motion abnormalities, grade 1 diastolic dysfunction, normal RV function, mild mitral valve regurgitation, moderate tricuspid valve regurgitation.   Patient was last seen by Dr. Flora Lipps on 01/23/2023.  When seen, he denied chest discomfort or shortness of breath.  At that time, patient was not on dialysis.  He denied significant anginal symptoms.  Remained on amlodipine, metoprolol, Imdur, Crestor.  Patient later started HD in 04/2023.    I saw patient in clinic on 07/20/2023.  At that time, patient reported fatigue, intermittent chest pressure.  Chest pressure occurred about 2 times per week and had been improving with nitroglycerin.  I ordered a nuclear stress test that was completed 07/27/2023 and was a normal, low risk study with estimated EF 66%.  I saw patient on 08/31/23. At that time, patient reported that he had been taking SL nitroglycerine as a daily medication, regardless of chest pain. He had not been taking  imdur or crestor. Had only been taking metoprolol tartrate once daily. Instructed him to resume imdur and crestor, take metoprolol BID, and educated him on the proper use of SL nitroglycerin.   CAD  Chest pain  Medication noncompliance/misuse  -Patient underwent left heart catheterization 06/2020 that showed totally occluded RCA, 70-80% stenosis in large first OM, 70-80% stenosis in left circumflex.  Patient was managed medically at the time.  EF estimated at 66%  - Nuclear stress test on 07/27/2023 was a normal, low risk study with estimated EF 66%.  - Continue metoprolol tartrate 50 mg BID, imdur 60 mg daily, crestor 40 mg daily, ASA 81 mg daily  -    ESRD on HD -Attends hemodialysis Tuesdays, Thursdays, Saturdays. Reports that HD manages his fluid well  - Euvolemic on exam today. Denies SOB, orthopnea, ankle edema  - Of note, patient does produce urine. Has not been taking lasix for unclear reasons. I instructed patient to ask his nephrologist about resuming lasix    Hyperlipidemia -Lipid panel from 11/2022 showed LDL 53 - Continue Crestor 40 mg daily - this was resumed on 10/28  - Ordered Lipid panel and LFTs to be collected in 4 weeks    HTN  -  - Continue imdur 60 mg daily, metoprolol tartrate 50 mg BID   ROS: ***  Studies Reviewed: .        *** Risk Assessment/Calculations:   {Does this patient have ATRIAL FIBRILLATION?[phone]} No BP recorded.  {Refresh Note OR Click here to enter BP  :1}***       Physical Exam:  VS:  There were no vitals taken for this visit.   Wt Readings from Last 3 Encounters:  08/31/23 122 lb 6.4 oz (55.5 kg)  07/20/23 117 lb (53.1 kg)  06/01/23 117 lb (53.1 kg)    GEN: Well nourished, well developed in no acute distress NECK: No JVD; No carotid bruits CARDIAC: ***RRR, no murmurs, rubs, gallops RESPIRATORY:  Clear to auscultation without rales, wheezing or rhonchi  ABDOMEN: Soft, non-tender, non-distended EXTREMITIES:  No edema; No  deformity   ASSESSMENT AND PLAN: .   ***    {Are you ordering a CV Procedure (e.g. stress test, cath, DCCV, TEE, etc)?   Press F2        :[id number]}  Dispo: ***  Signed, Jonita Albee, PA-C

## 2023-09-28 ENCOUNTER — Ambulatory Visit: Payer: Medicare HMO | Attending: Cardiology | Admitting: Cardiology

## 2023-11-02 ENCOUNTER — Emergency Department (HOSPITAL_COMMUNITY)
Admission: EM | Admit: 2023-11-02 | Discharge: 2023-11-02 | Discharge status: Against Medical Advice | Payer: Medicare HMO | Attending: Emergency Medicine | Admitting: Emergency Medicine

## 2023-11-02 DIAGNOSIS — Z5321 Procedure and treatment not carried out due to patient leaving prior to being seen by health care provider: Secondary | ICD-10-CM | POA: Diagnosis not present

## 2023-11-02 DIAGNOSIS — R0789 Other chest pain: Secondary | ICD-10-CM | POA: Insufficient documentation

## 2023-11-02 DIAGNOSIS — Z7982 Long term (current) use of aspirin: Secondary | ICD-10-CM | POA: Diagnosis not present

## 2023-11-02 DIAGNOSIS — Z992 Dependence on renal dialysis: Secondary | ICD-10-CM | POA: Insufficient documentation

## 2023-11-02 NOTE — ED Provider Triage Note (Cosign Needed)
Emergency Medicine Provider Triage Evaluation Note  Pedro Young , a 89 y.o. male  was evaluated in triage.  Pt complains of chest pain starting today during dialysis. Was given nitroglycerin and aspirin w/relief. Endorses intermittent chest pain for last few months.  Review of Systems  Positive: Chest pain Negative: sob  Physical Exam  BP 127/68 (BP Location: Right Arm)   Pulse 93   Temp 97.6 F (36.4 C) (Oral)   Resp 16   SpO2 98%  Gen:   Awake, no distress   Resp:  Normal effort  MSK:   Moves extremities without difficulty  Other:    Medical Decision Making  Medically screening exam initiated at 3:41 PM.  Appropriate orders placed.  Pedro Young was informed that the remainder of the evaluation will be completed by another provider, this initial triage assessment does not replace that evaluation, and the importance of remaining in the ED until their evaluation is complete.    Pedro Young, [place] 11/02/23 1542

## 2023-11-02 NOTE — ED Triage Notes (Signed)
BIB EMS from dialysis. 3hr into dialysis started to have chest pressure. No nausea, vomiting. No loc no dizziness. Pain resolved after medication from EMS  324asa 1 nitroglycerin  VS 132/65 Pulse87 99% RA

## 2023-11-02 NOTE — ED Notes (Signed)
Pt got up out of wheelchair and stated he was feeling fine and wanted to go home. Pt signed out AMA. Pt states he lives close by and will return if worsening pain

## 2024-03-21 NOTE — Progress Notes (Signed)
 Cardiology Office Note    Date:  03/21/2024  ID:  Pedro Young, DOB 08-25-1933, MRN [id number] PCP:  Garnet Just, FNP  Cardiologist:  Oneil Bigness, MD  Electrophysiologist:  None   Chief Complaint: Follow up for CAD   History of Present Illness: .    Pedro Young is a 89 y.o. male with visit-pertinent history of CAD, hypertension, BPH, end-stage renal disease on HD, hyperlipidemia.  On chart review patient previously underwent left heart catheterization on 06/19/2020 that showed dominant RCA with total occluded.  The distal RCA filled by left-to-right collaterals.  Left main was patent, there were luminal irregularities in the LAD but no significant obstructive disease.  Large first obtuse marginal contained 70 to 80% stenosis in the mid body.  The circumflex beyond the second small OM was 70 to 80% narrowed.  He was managed medically.  He underwent echocardiogram on 08/01/2020 that showed EF 66% with regional wall motion abnormalities, grade 1 diastolic dysfunction, normal RV function, mild mitral valve regurgitation, moderate tricuspid valve regurgitation.  Was seen by Dr. Rolm Clos on 07/20/2023.  At that time patient denied chest discomfort or shortness of breath.  At that time patient was not on dialysis.  He denies significant anginal symptoms.  He remained on amlodipine , metoprolol , Imdur , Crestor .  Elation later started HD in 04/2023.  Patient presented to clinic on 07/20/2023.  At that time patient reported fatigue, intermittent chest pressure.  Chest pressure occurred 2 times per week and had been improving with nitroglycerin .  A nuclear stress test was ordered and completed on 07/27/23 and was a normal, low restudy with estimated EF 60%.  Patient was last seen in clinic on 08/31/2023.  He noted that his biggest complaint was ankle pain, suspect be gout triggered by dietary intake.  He also reported occasional chest pressure, he had noted he had been managing with daily  nitroglycerin , despite medication being intended for an as needed use.  Patient reported he would often take sublingual nitroglycerin  when he did not have chest pain as he thought it was a daily medication.  Patient reported that he had not been taking prescribed medications including Imdur , Lasix and Crestor  due to lack of clear instructions.  Patient reported that no one had told him to.  Patient was instructed to resume Imdur  60 mg daily, Crestor  40 mg daily and to resume taking metoprolol  tartrate 50 mg twice daily rather than once daily.  It was recommended that he have follow-up in 1 month however did not show for appointment.  Today he presents for follow-up with his daughter.  They report that he has been doing very well, they have no cardiac concerns or complaints today.  He denies any chest pain, shortness of breath, lower extremity edema, orthopnea or PND.  He denies any palpitations, presyncope or syncope.  His daughter reports that they go for little walks daily and he tolerates very well.  He currently lives with his daughter who acts as primary caregiver.  He is currently on dialysis Tuesday, Wednesday and Thursday, he reports that he tolerates very well. ROS: .   Today he denies chest pain, shortness of breath, lower extremity edema, fatigue, palpitations, melena, hematuria, hemoptysis, diaphoresis, weakness, presyncope, syncope, orthopnea, and PND.  All other systems are reviewed and otherwise negative. Studies Reviewed: Aaron Aas   EKG:  EKG is not ordered today, patient deferred.  CV Studies: Cardiac studies reviewed are outlined and summarized above. Otherwise please see EMR for full report. Cardiac  Studies & Procedures   ______________________________________________________________________________________________ CARDIAC CATHETERIZATION  CARDIAC CATHETERIZATION 06/19/2020  Conclusion  Dominant right coronary which is totally occluded distally.  The distal RCA fills by left to right  collaterals from both LAD and circumflex territory.  Patent left main  Luminal irregularities in the LAD but no significant obstructive disease.  Large first obtuse marginal contains 70 to 80% stenosis in the mid body.  The continuation of the circumflex beyond a small second obtuse marginal is 70 to 80% narrowed.  LVEDP is 9 mmHg.  Left ventriculography not performed.  RECOMMENDATIONS:   Outpatient status.  Spoke with Dr. Fawn Hooks, [facility], who will take responsibility for follow-up creatinine in 3 days (at his recommendation), and will arrange it to be performed at his office.  Spoke with Dr. Jackquelyn Mass who will follow up with the patient in 1 week.  We previously discussed performing a basic metabolic panel in 48 hours, but after speaking with Dr. Jearldine Mina, the follow-up blood work will be by [employer].  Increase medical therapy intensity.  If continued angina, CTO on RCA and possibly stenting the first obtuse marginal and distal circumflex if warranted by symptoms after the patient is on dialysis.  Total contrast 21 cc.  Findings Coronary Findings Diagnostic  Dominance: Right  Left Circumflex Mid Cx to Dist Cx lesion is 70% stenosed.  First Obtuse Marginal Branch 1st Mrg lesion is 70% stenosed.  Right Coronary Artery Dist RCA lesion is 100% stenosed.  Right Posterior Descending Artery Collaterals RPDA filled by collaterals from 2nd Sept.  Third Right Posterolateral Branch Collaterals 3rd RPL filled by collaterals from 3rd Mrg.  Intervention  No interventions have been documented.   STRESS TESTS  MYOCARDIAL PERFUSION IMAGING 07/27/2023  Narrative   The study is normal. The study is low risk.   No ST deviation was noted.   Left ventricular function is normal. Nuclear stress EF: 66%. The left ventricular ejection fraction is hyperdynamic (>65%). End diastolic cavity size is normal.   Prior study not available for  comparison.  Small  defect with mild intensity in the inferior wall with rest, worse with stress. However, it appears to improve with gut attenuation correction images. I suspect this is artifact   ECHOCARDIOGRAM  ECHOCARDIOGRAM COMPLETE 08/01/2020  Narrative ECHOCARDIOGRAM REPORT    Patient Name:   TYLIEK TIMBERMAN Date of Exam: 08/01/2020 Medical Rec #:  [id number]       Height:       67.0 in Accession #:    [id number]      Weight:       123.0 lb Date of Birth:  1932-12-22        BSA:          1.645 m Patient Age:    87 years        BP:           75/120 mmHg Patient Gender: M               HR:           53 bpm. Exam Location:  [address]  Procedure: 2D Echo, 3D Echo, Cardiac Doppler, Color Doppler and Strain Analysis  Indications:    I25.118 CAD  History:        Patient has no prior history of Echocardiogram examinations. CAD and Occluded dRCA with left to right collaterals. 70% LCX disease, Signs/Symptoms:Chest Pain; Risk Factors:Hypertension and Former Smoker.  Sonographer:    Donnita Gales BA, RDCS  Referring Phys: [id number] Cathay Clonts O'NEAL  IMPRESSIONS   1. Left ventricular ejection fraction by 3D volume is 66 %. The left ventricle has normal function. The left ventricle demonstrates regional wall motion abnormalities (see scoring diagram/findings for description). There is mild left ventricular hypertrophy. Left ventricular diastolic parameters are consistent with Grade I diastolic dysfunction (impaired relaxation). The average left ventricular global longitudinal strain is -19.7 %. The global longitudinal strain is normal. 2. Right ventricular systolic function is normal. The right ventricular size is normal. There is normal pulmonary artery systolic pressure. The estimated right ventricular systolic pressure is 34.4 mmHg. 3. Left atrial size was mildly dilated. 4. The mitral valve is grossly normal. Mild mitral valve regurgitation. No evidence of mitral stenosis. 5.  Tricuspid valve regurgitation is moderate. 6. The aortic valve is grossly normal. There is mild calcification of the aortic valve. There is mild thickening of the aortic valve. Aortic valve regurgitation is trivial. No aortic stenosis is present. 7. The inferior vena cava is dilated in size with >50% respiratory variability, suggesting right atrial pressure of 8 mmHg.  FINDINGS Left Ventricle: Left ventricular ejection fraction by 3D volume is 66 %. The left ventricle has normal function. The left ventricle demonstrates regional wall motion abnormalities. The average left ventricular global longitudinal strain is -19.7 %. The global longitudinal strain is normal. The left ventricular internal cavity size was normal in size. There is mild left ventricular hypertrophy. Left ventricular diastolic parameters are consistent with Grade I diastolic dysfunction (impaired relaxation).   LV Wall Scoring: The mid and distal lateral wall is mildly hypokinetic.  Right Ventricle: The right ventricular size is normal. No increase in right ventricular wall thickness. Right ventricular systolic function is normal. There is normal pulmonary artery systolic pressure. The tricuspid regurgitant velocity is 2.57 m/s, and with an assumed right atrial pressure of 8 mmHg, the estimated right ventricular systolic pressure is 34.4 mmHg.  Left Atrium: Left atrial size was mildly dilated.  Right Atrium: Right atrial size was normal in size.  Pericardium: There is no evidence of pericardial effusion.  Mitral Valve: The mitral valve is grossly normal. Mild mitral valve regurgitation. No evidence of mitral valve stenosis.  Tricuspid Valve: The tricuspid valve is normal in structure. Tricuspid valve regurgitation is moderate . No evidence of tricuspid stenosis.  Aortic Valve: The aortic valve is grossly normal. There is mild calcification of the aortic valve. There is mild thickening of the aortic valve. Aortic valve  regurgitation is trivial. No aortic stenosis is present.  Pulmonic Valve: The pulmonic valve was normal in structure. Pulmonic valve regurgitation is trivial. No evidence of pulmonic stenosis.  Aorta: The aortic root is normal in size and structure.  Venous: The inferior vena cava is dilated in size with greater than 50% respiratory variability, suggesting right atrial pressure of 8 mmHg.  IAS/Shunts: No atrial level shunt detected by color flow Doppler.   LEFT VENTRICLE PLAX 2D LVIDd:         4.70 cm         Diastology LVIDs:         2.30 cm         LV e' medial:    5.00 cm/s LV PW:         1.10 cm         LV E/e' medial:  15.4 LV IVS:        1.10 cm         LV e' lateral:  5.44 cm/s LVOT diam:     2.10 cm         LV E/e' lateral: 14.2 LV SV:         109 LV SV Index:   67              2D LVOT Area:     3.46 cm        Longitudinal Strain 2D Strain GLS  -22.3 % (A2C): 2D Strain GLS  -18.6 % (A3C): 2D Strain GLS  -18.4 % (A4C): 2D Strain GLS  -19.7 % Avg:  3D Volume EF LV 3D EF:    Left ventricular ejection fraction by 3D volume is 66 %.  3D Volume EF: 3D EF:        66 % LV EDV:       142 ml LV ESV:       48 ml LV SV:        94 ml  RIGHT VENTRICLE             IVC RV Basal diam:  3.60 cm     IVC diam: 2.20 cm RV Mid diam:    2.40 cm RV S prime:     14.10 cm/s TAPSE (M-mode): 3.1 cm RVSP:           29.4 mmHg  LEFT ATRIUM             Index       RIGHT ATRIUM LA diam:        3.80 cm 2.31 cm/m  RA Pressure: 3.00 mmHg LA Vol (A2C):   52.3 ml 31.77 ml/m LA Vol (A4C):   52.4 ml 31.86 ml/m LA Biplane Vol: 56.1 ml 34.11 ml/m AORTIC VALVE LVOT Vmax:   122.00 cm/s LVOT Vmean:  87.300 cm/s LVOT VTI:    0.316 m  AORTA Ao Root diam: 3.50 cm Ao Asc diam:  3.80 cm  MITRAL VALVE               TRICUSPID VALVE MV Area (PHT):             TR Peak grad:   26.4 mmHg MV Decel Time: 261 msec    TR Vmax:        257.00 cm/s MV E velocity: 77.10 cm/s  Estimated RAP:  3.00  mmHg MV A velocity: 97.70 cm/s  RVSP:           29.4 mmHg MV E/A ratio:  0.79 SHUNTS Systemic VTI:  0.32 m Systemic Diam: 2.10 cm  Grady Lawman MD Electronically signed by Grady Lawman MD Signature Date/Time: 08/01/2020/6:41:56 PM    Final          ______________________________________________________________________________________________       Current Reported Medications:.    No outpatient medications have been marked as taking for the 03/25/24 encounter (Appointment) with Adalee Kathan D, NP.    Physical Exam:    VS:  There were no vitals taken for this visit.   Wt Readings from Last 3 Encounters:  08/31/23 122 lb 6.4 oz (55.5 kg)  07/20/23 117 lb (53.1 kg)  06/01/23 117 lb (53.1 kg)    GEN: Well nourished, well developed in no acute distress NECK: No JVD; No carotid bruits CARDIAC: RRR, no murmurs, rubs, gallops RESPIRATORY:  Clear to auscultation without rales, wheezing or rhonchi  ABDOMEN: Soft, non-tender, non-distended EXTREMITIES:  No edema; No acute deformity     Asessement and Plan:.    CAD: Patient underwent LHC in 8/21  and that showed totally occluded RCA, 70 to 80% stenosis in the large first OM, 78% stenosis in left circumflex.  Patient was managed medically at the time.  EF estimated at 66%.  Patient underwent nuclear stress test on 07/27/2023 that was a normal, low restudy with estimated EF 6 6% in setting of chest pain.  At last office visit in 08/2023 patient reported he been taking sublingual nitroglycerin  daily as he was not aware was and as needed medication, on further discussion it was noted he had not been taking his Imdur , Crestor  or Lasix for unclear reasons.  He was only taking metoprolol  tartrate once daily.  His medications were resumed as prescribed. Today he reports that he is been doing very well, he denies any chest pain, shortness of breath.  His daughter reports that they have been taking an older prescription of Imdur  and has been  taking 90 mg daily, will increase dosage back to 90 mg daily as he has been tolerating.  Reviewed ED precautions.  Continue aspirin  81 mg daily, Imdur  90 mg daily, metoprolol  tartrate 25 mg twice daily and Crestor  40 mg daily.  ESRD on HD: Attends hemodialysis Tuesdays, Thursdays and Saturdays.  Patient and daughter report that he tolerates very well, denies any increased fatigue after sessions.  He appears euvolemic and well compensated on exam.  Hyperlipidemia: Patient without a recent fasting lipid profile.  Check fasting lipid profile and LFTs.  Continue rosuvastatin  40 mg daily pending lab work.  Hypertension: Blood pressure today 122/60.  Patient and daughter deny drops in blood pressure after dialysis.  Continue Imdur  90 mg daily and metoprolol  tartrate 25 mg twice daily.   Disposition: F/u with Dr. Rolm Clos in six months or sooner if needed.   Signed, Mazal Ebey D Lorie Cleckley, NP

## 2024-03-25 ENCOUNTER — Ambulatory Visit: Attending: Cardiology | Admitting: Cardiology

## 2024-03-25 ENCOUNTER — Encounter: Payer: Self-pay | Admitting: Cardiology

## 2024-03-25 VITALS — BP 122/60 | HR 94 | Ht 66.0 in | Wt 122.4 lb

## 2024-03-25 DIAGNOSIS — N186 End stage renal disease: Secondary | ICD-10-CM

## 2024-03-25 DIAGNOSIS — I25118 Atherosclerotic heart disease of native coronary artery with other forms of angina pectoris: Secondary | ICD-10-CM | POA: Diagnosis not present

## 2024-03-25 DIAGNOSIS — E782 Mixed hyperlipidemia: Secondary | ICD-10-CM | POA: Diagnosis not present

## 2024-03-25 DIAGNOSIS — I10 Essential (primary) hypertension: Secondary | ICD-10-CM

## 2024-03-25 MED ORDER — ISOSORBIDE MONONITRATE ER 60 MG PO TB24: 90.0000 mg | ORAL_TABLET | Freq: Every day | ORAL | 3 refills | Status: DC

## 2024-03-25 NOTE — Patient Instructions (Signed)
 Medication Instructions:  No changes *If you need a refill on your cardiac medications before your next appointment, please call your pharmacy*  Lab Work: At your convenience we will need fasting labwork If you have labs (blood work) drawn today and your tests are completely normal, you will receive your results only by: MyChart Message (if you have MyChart) OR A paper copy in the mail If you have any lab test that is abnormal or we need to change your treatment, we will call you to review the results.  Testing/Procedures: No testing  Follow-Up: At [facility], you and your health needs are our priority.  As part of our continuing mission to provide you with exceptional heart care, our providers are all part of one team.  This team includes your primary Cardiologist (physician) and Advanced Practice Providers or APPs (Physician Assistants and Nurse Practitioners) who all work together to provide you with the care you need, when you need it.  Your next appointment:   6 month(s)  Provider:   Oneil Bigness, MD    We recommend signing up for the patient portal called "MyChart".  Sign up information is provided on this After Visit Summary.  MyChart is used to connect with patients for Virtual Visits (Telemedicine).  Patients are able to view lab/test results, encounter notes, upcoming appointments, etc.  Non-urgent messages can be sent to your provider as well.   To learn more about what you can do with MyChart, go to [email or web address].

## 2024-04-11 LAB — HEPATIC FUNCTION PANEL
ALT: 6 IU/L (ref 0–44)
AST: 10 IU/L (ref 0–40)
Albumin: 3.7 g/dL (ref 3.7–4.7)
Alkaline Phosphatase: 72 IU/L (ref 44–121)
Bilirubin Total: 0.3 mg/dL (ref 0.0–1.2)
Bilirubin, Direct: 0.14 mg/dL (ref 0.00–0.40)
Total Protein: 6.4 g/dL (ref 6.0–8.5)

## 2024-04-11 LAB — LIPID PANEL
Chol/HDL Ratio: 2.3 ratio (ref 0.0–5.0)
Cholesterol, Total: 123 mg/dL (ref 100–199)
HDL: 54 mg/dL (ref >39)
LDL Chol Calc (NIH): 54 mg/dL (ref 0–99)
Triglycerides: 74 mg/dL (ref 0–149)
VLDL Cholesterol Cal: 15 mg/dL (ref 5–40)

## 2024-04-13 ENCOUNTER — Ambulatory Visit: Payer: Self-pay | Admitting: Cardiology

## 2024-04-27 NOTE — Telephone Encounter (Signed)
-----   Message from Katlyn D West sent at 04/13/2024  7:41 AM EDT ----- Please let Mr. Pedro Young know that his lab work shows that his cholesterol is well controlled. His liver function is normal. Discussed with pharmacy team, as he is on dialysis he should be on lower dose of rosuvastatin . Recommend decreasing rosuvastatin  to 10 mg daily. Will recheck fasting lipid profile and LFTs on follow up.

## 2024-04-27 NOTE — Telephone Encounter (Signed)
 Left message to call back

## 2024-05-09 ENCOUNTER — Ambulatory Visit: Attending: Surgery | Admitting: Physician Assistant

## 2024-05-09 VITALS — BP 124/54 | HR 88 | Temp 98.1°F | Ht 66.0 in | Wt 124.3 lb

## 2024-05-09 DIAGNOSIS — N186 End stage renal disease: Secondary | ICD-10-CM | POA: Diagnosis not present

## 2024-05-09 NOTE — Progress Notes (Signed)
 HISTORY AND PHYSICAL     CC:  dialysis access Requesting Provider:  Dolan Mateo Larger, *  HPI: This is a 89 y.o. male here for evaluation of his hemodialysis access.    Pt has hx of left RC AVF created 05/04/2019 by Dr. Oris.   Pt comes in today for evaluation of his fistula.  He denies any pain in his left hand.  He states the fistula is working well and denies any alarming of the machines on dialysis.  He does not have any scabs over the fistula.  He has not had any prolonged bleeding.   The pt is right hand dominant.    Pt is on dialysis.   Days of dialysis if applicable:  T/T/S    HD center:  [address]  location.   The pt is on a statin for cholesterol management.  The pt is on a daily aspirin .  Other AC:  none The pt is on BB for hypertension.  The pt is not on medication for diabetes.   Tobacco hx:  former  Past Medical History:  Diagnosis Date   Cancer (HCC)    ESRD on hemodialysis (HCC)    HD at [facility] on TTS schedule, started June 2024   Hypertension     Past Surgical History:  Procedure Laterality Date   AV FISTULA PLACEMENT Left 05/04/2019   Procedure: Arteriovenous (Av) Fistula Creation Left Arm;  Surgeon: Oris Krystal FALCON, MD;  Location: [facility] OR;  Service: Vascular;  Laterality: Left;   BIOPSY  08/10/2019   Procedure: BIOPSY;  Surgeon: Charlanne Groom, MD;  Location: [facility] ENDOSCOPY;  Service: Endoscopy;;   CHOLECYSTECTOMY     COLONOSCOPY N/A 08/10/2019   Procedure: COLONOSCOPY;  Surgeon: Charlanne Groom, MD;  Location: [facility] ENDOSCOPY;  Service: Endoscopy;  Laterality: N/A;   EYE SURGERY Bilateral    cataract   LEFT HEART CATH AND CORONARY ANGIOGRAPHY N/A 06/19/2020   Procedure: LEFT HEART CATH AND CORONARY ANGIOGRAPHY;  Surgeon: Claudene Victory ORN, MD;  Location: [facility] INVASIVE CV LAB;  Service: Cardiovascular;  Laterality: N/A;   POLYPECTOMY  08/10/2019   Procedure: POLYPECTOMY;  Surgeon: Charlanne Groom, MD;  Location: [facility] ENDOSCOPY;  Service: Endoscopy;;    Allergies  Allergen  Reactions   Ranexa  [Ranolazine  Er] Other (See Comments)    Myclonus, hallucinations   Atorvastatin     REACTION: arthralgia  Other Reaction(s): Unknown   Lovastatin     REACTION: arthralgia  Other Reaction(s): Unknown   Amoxicillin-Pot Clavulanate Other (See Comments)    Did it involve swelling of the face/tongue/throat, SOB, or low BP? Unknown Did it involve sudden or severe rash/hives, skin peeling, or any reaction on the inside of your mouth or nose? Unknown Did you need to seek medical attention at a hospital or doctor's office? Unknown When did it last happen? patient is unfamiliar with this allergy   If all above answers are "NO", may proceed with cephalosporin use.     Current Outpatient Medications  Medication Sig Dispense Refill   acetaminophen  (TYLENOL ) 325 MG tablet Take 650 mg by mouth as needed (Dialysis days). (Patient not taking: Reported on 03/25/2024)     aspirin  EC 81 MG tablet Take 1 tablet (81 mg total) by mouth daily. Swallow whole. (Patient not taking: Reported on 03/25/2024) 90 tablet 3   colchicine  0.6 MG tablet Take 1 tablet (0.6 mg total) by mouth 2 (two) times daily as needed (gout). (Patient not taking: Reported on 03/25/2024) 60 tablet 1  diclofenac Sodium (VOLTAREN) 1 % GEL Apply 2 g topically 4 (four) times daily as needed (joint pain). (Patient not taking: Reported on 08/31/2023)     isosorbide  mononitrate (IMDUR ) 60 MG 24 hr tablet Take 1.5 tablets (90 mg total) by mouth daily. 135 tablet 3   metoprolol  tartrate (LOPRESSOR ) 25 MG tablet Take 2 tablets (50 mg total) by mouth 2 (two) times daily. (Patient taking differently: Take 50 mg by mouth 2 (two) times daily. Once a day) 60 tablet 1   mirtazapine  (REMERON ) 15 MG tablet Take 15 mg by mouth at bedtime.     nitroGLYCERIN  (NITROSTAT ) 0.4 MG SL tablet PLACE 1 TABLET UNDER THE TONGUE EVERY 5 MINUTES AS NEEDED FOR CHEST PAIN. (Patient not taking: Reported on 03/25/2024) 25 tablet 3   rosuvastatin  (CRESTOR ) 40  MG tablet Take 1 tablet (40 mg total) by mouth daily. 90 tablet 3   sevelamer (RENAGEL) 800 MG tablet Take 1 tablet by mouth 3 (three) times daily with meals.     tamsulosin  (FLOMAX ) 0.4 MG CAPS capsule Take 0.4 mg by mouth daily.     No current facility-administered medications for this visit.    Family History  Problem Relation Age of Onset   Liver disease Father    Kidney disease Father    Heart disease Father        before age 53   Heart disease Mother        before age 48    Social History   Socioeconomic History   Marital status: Married    Spouse name: Not on file   Number of children: Not on file   Years of education: Not on file   Highest education level: Not on file  Occupational History   Not on file  Tobacco Use   Smoking status: Former    Current packs/day: 0.00    Types: Cigarettes    Quit date: 70    Years since quitting: 69.5   Smokeless tobacco: Former    Quit date: 11/03/1954  Vaping Use   Vaping status: Never Used  Substance and Sexual Activity   Alcohol use: No    Alcohol/week: 0.0 standard drinks of alcohol   Drug use: No   Sexual activity: Not on file  Other Topics Concern   Not on file  Social History Narrative   From [place]   Moved to [place] 1968   Moved to [place] 2006   5 daughters   Social Drivers of Corporate investment banker Strain: Low Risk  (04/06/2024)   Received from [facility]   Overall Financial Resource Strain (CARDIA)    Difficulty of Paying Living Expenses: Not hard at all  Food Insecurity: No Food Insecurity (04/06/2024)   Received from [facility]   Hunger Vital Sign    Within the past 12 months, you worried that your food would run out before you got the money to buy more.: Never true    Within the past 12 months, the food you bought just didn't last and you didn't have money to get more.: Never true  Transportation Needs: No Transportation Needs (04/06/2024)   Received from [facility] - Transportation     Lack of Transportation (Medical): No    Lack of Transportation (Non-Medical): No  Physical Activity: Insufficiently Active (07/08/2023)   Received from [facility]   Exercise Vital Sign    On average, how many days per week do you engage in moderate to strenuous  exercise (like a brisk walk)?: 3 days    On average, how many minutes do you engage in exercise at this level?: 30 min  Stress: No Stress Concern Present (07/08/2023)   Received from [facility] - Occupational Stress Questionnaire    Feeling of Stress : Not at all  Social Connections: Socially Integrated (07/08/2023)   Received from [facility]   Social Network    How would you rate your social network (family, work, friends)?: Good participation with social networks  Intimate Partner Violence: Not At Risk (04/18/2023)   Humiliation, Afraid, Rape, and Kick questionnaire    Fear of Current or Ex-Partner: No    Emotionally Abused: No    Physically Abused: No    Sexually Abused: No     ROS: [x]  Positive   [ ]  Negative   [ ]  All sytems reviewed and are negative  Cardiac: [x]  CAD  Vascular: []  pain in legs while walking []  pain in feet when lying flat []  swelling in legs  Pulmonary: []  asthma []  wheezing  Neurologic: []  hx CVA/TIA  Hematologic: [x]  anemia   GI []  GERD  GU: [x]  CKD/renal failure  [x]  HD---[]  M/W/F []  T/T/S [x]  BPH  Psychiatric: []  hx of major depression  Integumentary: []  rashes []  ulcers  Constitutional: []  fever []  chills   PHYSICAL EXAMINATION:  Today's Vitals   05/09/24 0830  BP: (!) 124/54  Pulse: 88  Temp: 98.1 F (36.7 C)  TempSrc: Temporal  SpO2: 94%  Weight: 124 lb 4.8 oz (56.4 kg)  Height: 5' 6 (1.676 m)  PainSc: 0-No pain   Body mass index is 20.06 kg/m.    General:  WDWN male in NAD Gait: Not observed HENT: WNL Pulmonary: normal non-labored breathing  Cardiac: regular Skin: without rashes Vascular  Exam/Pulses:   Right Left  Radial 2+ (normal) 2+ (normal)   Extremities:  excellent thrill in fistula.  The skin moves freely over the fistula; no areas of shiny skin or scabs.  Motor and sensory are in tact left hand.  Musculoskeletal: no muscle wasting or atrophy  Neurologic: A&O X 3    ASSESSMENT/PLAN: 89 y.o. male with ESRD here for evaluation of his hemodialysis access with hx of  left RC AVF created 05/04/2019 by Dr. Oris.    -pt fistula with excellent thrill.  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  There is no shiny skin and pt reports fistula is working well without alarming while on dialysis.    Lucie Apt, [facility] [phone]  Clinic MD:   Serene

## 2024-05-10 NOTE — Telephone Encounter (Signed)
 Unable to leave message

## 2024-05-10 NOTE — Telephone Encounter (Signed)
-----   Message from Katlyn D West sent at 04/13/2024  7:41 AM EDT ----- Please let Pedro Young know that his lab work shows that his cholesterol is well controlled. His liver function is normal. Discussed with pharmacy team, as he is on dialysis he should be on lower dose of rosuvastatin . Recommend decreasing rosuvastatin  to 10 mg daily. Will recheck fasting lipid profile and LFTs on follow up.

## 2024-05-12 MED ORDER — ROSUVASTATIN CALCIUM 10 MG PO TABS: 10.0000 mg | ORAL_TABLET | Freq: Every day | ORAL | 3 refills | Status: DC

## 2024-05-12 NOTE — Telephone Encounter (Signed)
-----   Message from Katlyn D West sent at 04/13/2024  7:41 AM EDT ----- Please let Pedro Young know that his lab work shows that his cholesterol is well controlled. His liver function is normal. Discussed with pharmacy team, as he is on dialysis he should be on lower dose of rosuvastatin . Recommend decreasing rosuvastatin  to 10 mg daily. Will recheck fasting lipid profile and LFTs on follow up.

## 2024-05-12 NOTE — Telephone Encounter (Signed)
 Called patient advised of below they verbalized understanding.

## 2024-05-20 NOTE — Progress Notes (Signed)
 This patient's chart has been reviewed by a [facility] Specialist.     Attempted to contact patient in order to discuss appointments and screenings due for the upcoming year.     Left message with [facility] Specialist's contact information. with recommendations and contact information.    Additional Comments:

## 2024-08-08 ENCOUNTER — Other Ambulatory Visit (HOSPITAL_COMMUNITY): Payer: Self-pay

## 2024-08-08 MED ORDER — TAMSULOSIN HCL 0.4 MG PO CAPS
0.4000 mg | ORAL_CAPSULE | Freq: Every day | ORAL | 1 refills | Status: DC
  Filled 2024-08-08: qty 30, 30d supply, fill #0

## 2024-08-09 ENCOUNTER — Other Ambulatory Visit (HOSPITAL_COMMUNITY): Payer: Self-pay

## 2024-08-09 MED ORDER — ROSUVASTATIN CALCIUM 40 MG PO TABS
40.0000 mg | ORAL_TABLET | Freq: Every day | ORAL | 3 refills | Status: DC
  Filled 2024-08-26: qty 90, 90d supply, fill #0

## 2024-08-09 MED ORDER — ISOSORBIDE MONONITRATE ER 60 MG PO TB24
60.0000 mg | ORAL_TABLET | Freq: Every day | ORAL | 3 refills | Status: DC
  Filled 2024-08-26: qty 90, 90d supply, fill #0

## 2024-08-09 MED ORDER — ASPIRIN 81 MG PO TBEC: 81.0000 mg | DELAYED_RELEASE_TABLET | Freq: Every day | ORAL | 3 refills | Status: DC

## 2024-08-10 ENCOUNTER — Other Ambulatory Visit (HOSPITAL_COMMUNITY): Payer: Self-pay

## 2024-08-18 ENCOUNTER — Other Ambulatory Visit (HOSPITAL_COMMUNITY): Payer: Self-pay

## 2024-08-26 ENCOUNTER — Other Ambulatory Visit (HOSPITAL_COMMUNITY): Payer: Self-pay

## 2024-08-26 MED ORDER — SODIUM ZIRCONIUM CYCLOSILICATE 10 G PO PACK
10.0000 g | PACK | ORAL | 5 refills | Status: DC
  Filled 2024-09-05: qty 30, 30d supply, fill #0

## 2024-08-26 MED ORDER — ROSUVASTATIN CALCIUM 10 MG PO TABS
10.0000 mg | ORAL_TABLET | Freq: Every day | ORAL | 2 refills | Status: DC
  Filled 2024-09-01: qty 90, 90d supply, fill #0

## 2024-08-26 MED ORDER — MIRTAZAPINE 15 MG PO TABS: 15.0000 mg | ORAL_TABLET | Freq: Every day | ORAL | 2 refills | Status: AC

## 2024-09-01 ENCOUNTER — Other Ambulatory Visit (HOSPITAL_COMMUNITY): Payer: Self-pay

## 2024-09-05 ENCOUNTER — Other Ambulatory Visit: Payer: Self-pay

## 2024-09-05 ENCOUNTER — Other Ambulatory Visit (HOSPITAL_COMMUNITY): Payer: Self-pay

## 2024-09-08 ENCOUNTER — Encounter (HOSPITAL_COMMUNITY): Payer: Self-pay | Admitting: Emergency Medicine

## 2024-09-08 ENCOUNTER — Other Ambulatory Visit: Payer: Self-pay

## 2024-09-08 ENCOUNTER — Emergency Department (HOSPITAL_COMMUNITY)

## 2024-09-08 ENCOUNTER — Inpatient Hospital Stay (HOSPITAL_COMMUNITY)
Admission: EM | Admit: 2024-09-08 | Discharge: 2024-09-23 | DRG: 871 | Disposition: A | Discharge status: Hospice | Attending: Internal Medicine | Admitting: Internal Medicine

## 2024-09-08 DIAGNOSIS — D649 Anemia, unspecified: Secondary | ICD-10-CM | POA: Diagnosis present

## 2024-09-08 DIAGNOSIS — E038 Other specified hypothyroidism: Secondary | ICD-10-CM | POA: Diagnosis present

## 2024-09-08 DIAGNOSIS — I81 Portal vein thrombosis: Secondary | ICD-10-CM | POA: Diagnosis present

## 2024-09-08 DIAGNOSIS — Z66 Do not resuscitate: Secondary | ICD-10-CM | POA: Diagnosis not present

## 2024-09-08 DIAGNOSIS — E43 Unspecified severe protein-calorie malnutrition: Secondary | ICD-10-CM | POA: Diagnosis present

## 2024-09-08 DIAGNOSIS — E861 Hypovolemia: Secondary | ICD-10-CM

## 2024-09-08 DIAGNOSIS — A419 Sepsis, unspecified organism: Principal | ICD-10-CM | POA: Diagnosis present

## 2024-09-08 DIAGNOSIS — N186 End stage renal disease: Secondary | ICD-10-CM | POA: Diagnosis present

## 2024-09-08 DIAGNOSIS — Z515 Encounter for palliative care: Secondary | ICD-10-CM

## 2024-09-08 DIAGNOSIS — I959 Hypotension, unspecified: Secondary | ICD-10-CM | POA: Diagnosis present

## 2024-09-08 DIAGNOSIS — N184 Chronic kidney disease, stage 4 (severe): Secondary | ICD-10-CM

## 2024-09-08 DIAGNOSIS — I953 Hypotension of hemodialysis: Secondary | ICD-10-CM | POA: Diagnosis present

## 2024-09-08 DIAGNOSIS — R569 Unspecified convulsions: Secondary | ICD-10-CM | POA: Diagnosis present

## 2024-09-08 DIAGNOSIS — G9341 Metabolic encephalopathy: Secondary | ICD-10-CM | POA: Diagnosis present

## 2024-09-08 DIAGNOSIS — Z634 Disappearance and death of family member: Secondary | ICD-10-CM

## 2024-09-08 DIAGNOSIS — R54 Age-related physical debility: Secondary | ICD-10-CM | POA: Diagnosis present

## 2024-09-08 DIAGNOSIS — I5032 Chronic diastolic (congestive) heart failure: Secondary | ICD-10-CM | POA: Diagnosis present

## 2024-09-08 DIAGNOSIS — I132 Hypertensive heart and chronic kidney disease with heart failure and with stage 5 chronic kidney disease, or end stage renal disease: Secondary | ICD-10-CM | POA: Diagnosis present

## 2024-09-08 DIAGNOSIS — K861 Other chronic pancreatitis: Secondary | ICD-10-CM | POA: Diagnosis present

## 2024-09-08 DIAGNOSIS — E8809 Other disorders of plasma-protein metabolism, not elsewhere classified: Secondary | ICD-10-CM | POA: Diagnosis present

## 2024-09-08 DIAGNOSIS — I4891 Unspecified atrial fibrillation: Principal | ICD-10-CM | POA: Diagnosis present

## 2024-09-08 DIAGNOSIS — I2510 Atherosclerotic heart disease of native coronary artery without angina pectoris: Secondary | ICD-10-CM | POA: Diagnosis not present

## 2024-09-08 DIAGNOSIS — Z888 Allergy status to other drugs, medicaments and biological substances status: Secondary | ICD-10-CM

## 2024-09-08 DIAGNOSIS — E785 Hyperlipidemia, unspecified: Secondary | ICD-10-CM | POA: Diagnosis not present

## 2024-09-08 DIAGNOSIS — E876 Hypokalemia: Secondary | ICD-10-CM | POA: Diagnosis present

## 2024-09-08 DIAGNOSIS — Z7901 Long term (current) use of anticoagulants: Secondary | ICD-10-CM

## 2024-09-08 DIAGNOSIS — I10 Essential (primary) hypertension: Secondary | ICD-10-CM | POA: Diagnosis not present

## 2024-09-08 DIAGNOSIS — I2582 Chronic total occlusion of coronary artery: Secondary | ICD-10-CM | POA: Diagnosis present

## 2024-09-08 DIAGNOSIS — Z8419 Family history of other disorders of kidney and ureter: Secondary | ICD-10-CM

## 2024-09-08 DIAGNOSIS — K5900 Constipation, unspecified: Secondary | ICD-10-CM | POA: Diagnosis present

## 2024-09-08 DIAGNOSIS — Z1152 Encounter for screening for COVID-19: Secondary | ICD-10-CM

## 2024-09-08 DIAGNOSIS — K769 Liver disease, unspecified: Secondary | ICD-10-CM | POA: Diagnosis present

## 2024-09-08 DIAGNOSIS — R627 Adult failure to thrive: Secondary | ICD-10-CM | POA: Diagnosis present

## 2024-09-08 DIAGNOSIS — Z681 Body mass index (BMI) 19 or less, adult: Secondary | ICD-10-CM

## 2024-09-08 DIAGNOSIS — J40 Bronchitis, not specified as acute or chronic: Secondary | ICD-10-CM | POA: Diagnosis present

## 2024-09-08 DIAGNOSIS — D631 Anemia in chronic kidney disease: Secondary | ICD-10-CM | POA: Diagnosis present

## 2024-09-08 DIAGNOSIS — Z87891 Personal history of nicotine dependence: Secondary | ICD-10-CM

## 2024-09-08 DIAGNOSIS — Z79899 Other long term (current) drug therapy: Secondary | ICD-10-CM

## 2024-09-08 DIAGNOSIS — N2581 Secondary hyperparathyroidism of renal origin: Secondary | ICD-10-CM | POA: Diagnosis present

## 2024-09-08 DIAGNOSIS — Z604 Social exclusion and rejection: Secondary | ICD-10-CM | POA: Diagnosis present

## 2024-09-08 DIAGNOSIS — Z992 Dependence on renal dialysis: Secondary | ICD-10-CM

## 2024-09-08 DIAGNOSIS — I4819 Other persistent atrial fibrillation: Secondary | ICD-10-CM | POA: Diagnosis present

## 2024-09-08 DIAGNOSIS — Z880 Allergy status to penicillin: Secondary | ICD-10-CM

## 2024-09-08 DIAGNOSIS — N400 Enlarged prostate without lower urinary tract symptoms: Secondary | ICD-10-CM | POA: Diagnosis present

## 2024-09-08 DIAGNOSIS — K5733 Diverticulitis of large intestine without perforation or abscess with bleeding: Secondary | ICD-10-CM | POA: Diagnosis present

## 2024-09-08 DIAGNOSIS — N3289 Other specified disorders of bladder: Secondary | ICD-10-CM | POA: Diagnosis present

## 2024-09-08 DIAGNOSIS — J189 Pneumonia, unspecified organism: Secondary | ICD-10-CM | POA: Diagnosis present

## 2024-09-08 DIAGNOSIS — Z8249 Family history of ischemic heart disease and other diseases of the circulatory system: Secondary | ICD-10-CM

## 2024-09-08 LAB — I-STAT CHEM 8, ED
BUN: 16 mg/dL (ref 8–23)
Calcium, Ion: 0.97 mmol/L — ABNORMAL LOW (ref 1.15–1.40)
Chloride: 94 mmol/L — ABNORMAL LOW (ref 98–111)
Creatinine, Ser: 3.1 mg/dL — ABNORMAL HIGH (ref 0.61–1.24)
Glucose, Bld: 85 mg/dL (ref 70–99)
HCT: 33 % — ABNORMAL LOW (ref 39.0–52.0)
Hemoglobin: 11.2 g/dL — ABNORMAL LOW (ref 13.0–17.0)
Potassium: 3.1 mmol/L — ABNORMAL LOW (ref 3.5–5.1)
Sodium: 137 mmol/L (ref 135–145)
TCO2: 28 mmol/L (ref 22–32)

## 2024-09-08 LAB — I-STAT VENOUS BLOOD GAS, ED
Acid-Base Excess: 8 mmol/L — ABNORMAL HIGH (ref 0.0–2.0)
Bicarbonate: 31.8 mmol/L — ABNORMAL HIGH (ref 20.0–28.0)
Calcium, Ion: 0.99 mmol/L — ABNORMAL LOW (ref 1.15–1.40)
HCT: 32 % — ABNORMAL LOW (ref 39.0–52.0)
Hemoglobin: 10.9 g/dL — ABNORMAL LOW (ref 13.0–17.0)
O2 Saturation: 51 %
Potassium: 3.1 mmol/L — ABNORMAL LOW (ref 3.5–5.1)
Sodium: 137 mmol/L (ref 135–145)
TCO2: 33 mmol/L — ABNORMAL HIGH (ref 22–32)
pCO2, Ven: 40.4 mmHg — ABNORMAL LOW (ref 44–60)
pH, Ven: 7.504 — ABNORMAL HIGH (ref 7.25–7.43)
pO2, Ven: 25 mmHg — CL (ref 32–45)

## 2024-09-08 LAB — CBC WITH DIFFERENTIAL/PLATELET
Abs Immature Granulocytes: 0.12 K/uL — ABNORMAL HIGH (ref 0.00–0.07)
Basophils Absolute: 0 K/uL (ref 0.0–0.1)
Basophils Relative: 0 %
Eosinophils Absolute: 0.1 K/uL (ref 0.0–0.5)
Eosinophils Relative: 1 %
HCT: 33.3 % — ABNORMAL LOW (ref 39.0–52.0)
Hemoglobin: 11.2 g/dL — ABNORMAL LOW (ref 13.0–17.0)
Immature Granulocytes: 1 %
Lymphocytes Relative: 11 %
Lymphs Abs: 1 K/uL (ref 0.7–4.0)
MCH: 32.8 pg (ref 26.0–34.0)
MCHC: 33.6 g/dL (ref 30.0–36.0)
MCV: 97.7 fL (ref 80.0–100.0)
Monocytes Absolute: 0.9 K/uL (ref 0.1–1.0)
Monocytes Relative: 9 %
Neutro Abs: 7.3 K/uL (ref 1.7–7.7)
Neutrophils Relative %: 78 %
Platelets: 149 K/uL — ABNORMAL LOW (ref 150–400)
RBC: 3.41 MIL/uL — ABNORMAL LOW (ref 4.22–5.81)
RDW: 14.8 % (ref 11.5–15.5)
WBC: 9.3 K/uL (ref 4.0–10.5)
nRBC: 0 % (ref 0.0–0.2)

## 2024-09-08 LAB — COMPREHENSIVE METABOLIC PANEL WITH GFR
ALT: 39 U/L (ref 0–44)
AST: 46 U/L — ABNORMAL HIGH (ref 15–41)
Albumin: 2.6 g/dL — ABNORMAL LOW (ref 3.5–5.0)
Alkaline Phosphatase: 177 U/L — ABNORMAL HIGH (ref 38–126)
Anion gap: 19 — ABNORMAL HIGH (ref 5–15)
BUN: 15 mg/dL (ref 8–23)
CO2: 26 mmol/L (ref 22–32)
Calcium: 8.2 mg/dL — ABNORMAL LOW (ref 8.9–10.3)
Chloride: 93 mmol/L — ABNORMAL LOW (ref 98–111)
Creatinine, Ser: 3.38 mg/dL — ABNORMAL HIGH (ref 0.61–1.24)
GFR, Estimated: 17 mL/min — ABNORMAL LOW (ref >60)
Glucose, Bld: 88 mg/dL (ref 70–99)
Potassium: 3.1 mmol/L — ABNORMAL LOW (ref 3.5–5.1)
Sodium: 138 mmol/L (ref 135–145)
Total Bilirubin: 1.6 mg/dL — ABNORMAL HIGH (ref 0.0–1.2)
Total Protein: 6.6 g/dL (ref 6.5–8.1)

## 2024-09-08 LAB — TSH: TSH: 3.413 u[IU]/mL (ref 0.350–4.500)

## 2024-09-08 LAB — BRAIN NATRIURETIC PEPTIDE: B Natriuretic Peptide: 270.8 pg/mL — ABNORMAL HIGH (ref 0.0–100.0)

## 2024-09-08 LAB — MAGNESIUM: Magnesium: 1.8 mg/dL (ref 1.7–2.4)

## 2024-09-08 LAB — CBG MONITORING, ED: Glucose-Capillary: 83 mg/dL (ref 70–99)

## 2024-09-08 MED ORDER — AMIODARONE HCL IN DEXTROSE 360-4.14 MG/200ML-% IV SOLN
60.0000 mg/h | INTRAVENOUS | Status: DC
  Administered 2024-09-08: 60 mg/h via INTRAVENOUS
  Filled 2024-09-08: qty 200

## 2024-09-08 MED ORDER — HEPARIN BOLUS VIA INFUSION
3400.0000 [IU] | Freq: Once | INTRAVENOUS | Status: AC
  Administered 2024-09-08: 3400 [IU] via INTRAVENOUS
  Filled 2024-09-08: qty 3400

## 2024-09-08 MED ORDER — POTASSIUM CHLORIDE CRYS ER 20 MEQ PO TBCR
40.0000 meq | EXTENDED_RELEASE_TABLET | Freq: Once | ORAL | Status: AC
  Administered 2024-09-08: 40 meq via ORAL
  Filled 2024-09-08: qty 2

## 2024-09-08 MED ORDER — IOHEXOL 350 MG/ML SOLN
75.0000 mL | Freq: Once | INTRAVENOUS | Status: AC | PRN
  Administered 2024-09-08: 75 mL via INTRAVENOUS

## 2024-09-08 MED ORDER — SODIUM CHLORIDE 0.9 % IV BOLUS
500.0000 mL | Freq: Once | INTRAVENOUS | Status: AC
  Administered 2024-09-08: 500 mL via INTRAVENOUS

## 2024-09-08 MED ORDER — HEPARIN (PORCINE) 25000 UT/250ML-% IV SOLN
1150.0000 [IU]/h | INTRAVENOUS | Status: DC
  Administered 2024-09-08: 950 [IU]/h via INTRAVENOUS
  Filled 2024-09-08: qty 250

## 2024-09-08 MED ORDER — AMIODARONE LOAD VIA INFUSION
150.0000 mg | Freq: Once | INTRAVENOUS | Status: AC
Start: 2024-09-08 — End: 2024-09-08
  Administered 2024-09-08: 150 mg via INTRAVENOUS
  Filled 2024-09-08: qty 83.34

## 2024-09-08 MED ORDER — AMIODARONE HCL IN DEXTROSE 360-4.14 MG/200ML-% IV SOLN
30.0000 mg/h | INTRAVENOUS | Status: DC
  Administered 2024-09-09: 30 mg/h via INTRAVENOUS
  Filled 2024-09-08 (×2): qty 200

## 2024-09-08 MED ORDER — AMIODARONE LOAD VIA INFUSION
150.0000 mg | Freq: Once | INTRAVENOUS | Status: DC
  Filled 2024-09-08: qty 83.34

## 2024-09-08 NOTE — ED Notes (Signed)
 CT arrived to take pt for imaging

## 2024-09-08 NOTE — Subjective & Objective (Signed)
 Came from hd hx of ESSRD on HD t H sat had dizziness palpitations no hx of A.fib Found to have a.fib w RVR HR to 170 - now 140's Cardiology has seen him and rec starting on heparin  and bolus with Amiodarone

## 2024-09-08 NOTE — Assessment & Plan Note (Signed)
 Obtain anemia panel  Transfuse for Hg <7 , rapidly dropping or  if symptomatic

## 2024-09-08 NOTE — Assessment & Plan Note (Signed)
Continue on crestor  

## 2024-09-08 NOTE — ED Notes (Signed)
 Labs sent. Pt placed on 2L [place]. Pt sat 93-95% with mild tachycardia 103/111bpm.  MD notified and approved.

## 2024-09-08 NOTE — Assessment & Plan Note (Signed)
 Had HD done today  Will need ot notify nephrology that patient is coming in

## 2024-09-08 NOTE — Assessment & Plan Note (Signed)
 Appreciate cardiology consult patient was started on heparin  drip and amiodarone drip heart rate now down to 110s CTA negative for PE. Echogram in the morning

## 2024-09-08 NOTE — ED Notes (Signed)
 MD Almousa at bedside with ultrasound

## 2024-09-08 NOTE — Assessment & Plan Note (Signed)
 As per cardiology  Currently appears to be stable No chest pain When able would restart aspirin  If blood pressure allows would restart Imdur  Continue Crestor 

## 2024-09-08 NOTE — ED Notes (Signed)
 Labs drawn at 2000 which is located in the flowsheet. Lab results have not been posted. Esclated to lab and lab has not been able to locate labs. Labs re drawn and sent

## 2024-09-08 NOTE — Consult Note (Addendum)
 Cardiology Consultation   Patient ID: Jashawn Floyd MRN: [id number]; DOB: 08/18/1933  Admit date: 09/08/2024 Date of Consult: 09/08/2024  PCP:  Freddrick Johns   [facility] Providers Cardiologist:  [name], MD     Patient Profile: Oluwaferanmi Wain is a 89 y.o. male with a hx of CAD, hypertension, BPH, ESRD on HD, hyperlipidemia who is being seen 09/08/2024 for the evaluation of atrial fibrillation at the request of Dr. Bari.  History of Present Illness: Mr. Olesen is an 89 year old male with past medical history noted above.  He is been followed by Dr. Decent as an outpatient.  Previously underwent left heart catheterization 06/2020 that showed a dominant RCA which was totally occluded which filled with left-to-right collaterals, patent left main, luminal irregularities in the LAD but no significant obstructive disease, first OM 70 to 80% stenosis mid vessel and circumflex beyond second OM of 70 to 80% narrowing which was medically managed.  Echocardiogram showed LV EF of 66% with no regional wall motion abnormality, grade 1 diastolic dysfunction, normal RV, mild MR, moderate TR.  Seen in the clinic 01/2023 denied any chest discomfort, at that time was not on dialysis.  Later started HD 04/2023.  Seen in the clinic 03/2024 with [facility] and reported overall doing well with no cardiac complaint.  He was undergoing HD on Tuesday, Thursday, Saturday.  He is continued on aspirin , Imdur  90 mg daily, metoprolol  tartrate 25 mg twice daily, Crestor  40 mg daily.  Presented to the ED on 11/6 from dialysis having completed 3-1/2 hours.  On scene was found to be hypotensive with heart rate of 150 and atrial fibrillation with RVR.  In the ED labs showed sodium 138, potassium 3.1, creatinine 3.3, albumin 2.6, BNP 271, WBC 3.9, hemoglobin 11.2.  Chest x-ray with no acute cardiopulmonary process noted.  EKG shows atrial fibrillation, 167 bpm. Has been started on IV amiodarone.   At the time of  interview, patient denies any complaints other than feeling palpitations.  States he has been in his usual state of health up until today during dialysis.  Denies any chest pain.   Past Medical History:  Diagnosis Date   Cancer [facility])    ESRD on hemodialysis (HCC)    HD at [facility] on TTS schedule, started June 2024   Hypertension     Past Surgical History:  Procedure Laterality Date   AV FISTULA PLACEMENT Left 05/04/2019   Procedure: Arteriovenous (Av) Fistula Creation Left Arm;  Surgeon: Oris Krystal FALCON, MD;  Location: [facility] OR;  Service: Vascular;  Laterality: Left;   BIOPSY  08/10/2019   Procedure: BIOPSY;  Surgeon: Charlanne Groom, MD;  Location: [facility] ENDOSCOPY;  Service: Endoscopy;;   CHOLECYSTECTOMY     COLONOSCOPY N/A 08/10/2019   Procedure: COLONOSCOPY;  Surgeon: Charlanne Groom, MD;  Location: [facility] ENDOSCOPY;  Service: Endoscopy;  Laterality: N/A;   EYE SURGERY Bilateral    cataract   LEFT HEART CATH AND CORONARY ANGIOGRAPHY N/A 06/19/2020   Procedure: LEFT HEART CATH AND CORONARY ANGIOGRAPHY;  Surgeon: Claudene Victory ORN, MD;  Location: [facility] INVASIVE CV LAB;  Service: Cardiovascular;  Laterality: N/A;   POLYPECTOMY  08/10/2019   Procedure: POLYPECTOMY;  Surgeon: Charlanne Groom, MD;  Location: [facility] ENDOSCOPY;  Service: Endoscopy;;      Scheduled Meds:  amiodarone  150 mg Intravenous Once   heparin   3,400 Units Intravenous Once   Continuous Infusions:  amiodarone 60 mg/hr (09/08/24 1634)   Followed by   amiodarone  heparin      PRN Meds:   Allergies:    Allergies  Allergen Reactions   Ranexa  [Ranolazine  Er] Other (See Comments)    Myclonus, hallucinations   Atorvastatin     REACTION: arthralgia  Other Reaction(s): Unknown   Lovastatin     REACTION: arthralgia  Other Reaction(s): Unknown   Amoxicillin-Pot Clavulanate Other (See Comments)    Did it involve swelling of the face/tongue/throat, SOB, or low BP? Unknown Did it involve sudden or severe rash/hives, skin peeling, or any  reaction on the inside of your mouth or nose? Unknown Did you need to seek medical attention at a hospital or doctor's office? Unknown When did it last happen? patient is unfamiliar with this allergy   If all above answers are "NO", may proceed with cephalosporin use.     Social History:   Social History   Socioeconomic History   Marital status: Married    Spouse name: Not on file   Number of children: Not on file   Years of education: Not on file   Highest education level: Not on file  Occupational History   Not on file  Tobacco Use   Smoking status: Former    Current packs/day: 0.00    Types: Cigarettes    Quit date: 51    Years since quitting: 69.8   Smokeless tobacco: Former    Quit date: 11/03/1954  Vaping Use   Vaping status: Never Used  Substance and Sexual Activity   Alcohol use: No    Alcohol/week: 0.0 standard drinks of alcohol   Drug use: No   Sexual activity: Not on file  Other Topics Concern   Not on file  Social History Narrative   From [place]   Moved to [place] 1968   Moved to [place] 2006   5 daughters   Social Drivers of Corporate Investment Banker Strain: Low Risk  (04/06/2024)   Received from [facility]   Overall Financial Resource Strain (CARDIA)    Difficulty of Paying Living Expenses: Not hard at all  Food Insecurity: No Food Insecurity (04/06/2024)   Received from [facility]   Hunger Vital Sign    Within the past 12 months, you worried that your food would run out before you got the money to buy more.: Never true    Within the past 12 months, the food you bought just didn't last and you didn't have money to get more.: Never true  Transportation Needs: No Transportation Needs (04/06/2024)   Received from [facility] - Transportation    Lack of Transportation (Medical): No    Lack of Transportation (Non-Medical): No  Physical Activity: Insufficiently Active (07/08/2023)   Received from [facility]   Exercise Vital Sign    On  average, how many days per week do you engage in moderate to strenuous exercise (like a brisk walk)?: 3 days    On average, how many minutes do you engage in exercise at this level?: 30 min  Stress: No Stress Concern Present (07/08/2023)   Received from [facility] - Occupational Stress Questionnaire    Feeling of Stress : Not at all  Social Connections: Socially Integrated (07/08/2023)   Received from [facility]   Social Network    How would you rate your social network (family, work, friends)?: Good participation with social networks  Intimate Partner Violence: Not At Risk (04/18/2023)   Humiliation, Afraid, Rape, and Kick  questionnaire    Fear of Current or Ex-Partner: No    Emotionally Abused: No    Physically Abused: No    Sexually Abused: No    Family History:    Family History  Problem Relation Age of Onset   Liver disease Father    Kidney disease Father    Heart disease Father        before age 14   Heart disease Mother        before age 31     ROS:  Please see the history of present illness.   All other ROS reviewed and negative.     Physical Exam/Data: Vitals:   09/08/24 1600 09/08/24 1603 09/08/24 1645 09/08/24 1800  BP:  (!) 94/57 112/65 104/62  Pulse:  (!) 144 (!) 155 (!) 159  Resp:  (!) 22 (!) 23 (!) 22  SpO2:   97% 97%  Weight: 68 kg     Height: 5' 6 (1.676 m)      No intake or output data in the 24 hours ending 09/08/24 1835    09/08/2024    4:00 PM 05/09/2024    8:30 AM 03/25/2024    9:47 AM  Last 3 Weights  Weight (lbs) 150 lb 124 lb 4.8 oz 122 lb 6.4 oz  Weight (kg) 68.04 kg 56.382 kg 55.52 kg     Body mass index is 24.21 kg/m.  General: Thin, frail older male HEENT: normal Neck: no JVD Vascular: No carotid bruits; Distal pulses 2+ bilaterally Cardiac:  normal S1, S2; tachycardic; no murmur  Lungs:  clear to auscultation bilaterally Abd: soft, nontender, no hepatomegaly  Ext: no  edema Musculoskeletal:  No deformities, BUE and BLE strength normal and equal Skin: warm and dry  Neuro: no focal abnormalities noted Psych:  Normal affect   EKG:  The EKG was personally reviewed and demonstrates: Atrial fibrillation, 167 bpm Telemetry:  Telemetry was personally reviewed and demonstrates: Atrial fibrillation with RVR, 150-170bpm  Relevant CV Studies:  Echo: 07/2020  IMPRESSIONS     1. Left ventricular ejection fraction by 3D volume is 66 %. The left  ventricle has normal function. The left ventricle demonstrates regional  wall motion abnormalities (see scoring diagram/findings for description).  There is mild left ventricular  hypertrophy. Left ventricular diastolic parameters are consistent with  Grade I diastolic dysfunction (impaired relaxation). The average left  ventricular global longitudinal strain is -19.7 %. The global longitudinal  strain is normal.   2. Right ventricular systolic function is normal. The right ventricular  size is normal. There is normal pulmonary artery systolic pressure. The  estimated right ventricular systolic pressure is 34.4 mmHg.   3. Left atrial size was mildly dilated.   4. The mitral valve is grossly normal. Mild mitral valve regurgitation.  No evidence of mitral stenosis.   5. Tricuspid valve regurgitation is moderate.   6. The aortic valve is grossly normal. There is mild calcification of the  aortic valve. There is mild thickening of the aortic valve. Aortic valve  regurgitation is trivial. No aortic stenosis is present.   7. The inferior vena cava is dilated in size with >50% respiratory  variability, suggesting right atrial pressure of 8 mmHg.   FINDINGS   Left Ventricle: Left ventricular ejection fraction by 3D volume is 66 %.  The left ventricle has normal function. The left ventricle demonstrates  regional wall motion abnormalities. The average left ventricular global  longitudinal strain is -19.7 %. The  global  longitudinal strain is normal. The left ventricular internal cavity  size was normal in size. There is mild left ventricular hypertrophy. Left  ventricular diastolic parameters are consistent with Grade I diastolic  dysfunction (impaired relaxation).     LV Wall Scoring:  The mid and distal lateral wall is mildly hypokinetic.   Right Ventricle: The right ventricular size is normal. No increase in  right ventricular wall thickness. Right ventricular systolic function is  normal. There is normal pulmonary artery systolic pressure. The tricuspid  regurgitant velocity is 2.57 m/s, and   with an assumed right atrial pressure of 8 mmHg, the estimated right  ventricular systolic pressure is 34.4 mmHg.   Left Atrium: Left atrial size was mildly dilated.   Right Atrium: Right atrial size was normal in size.   Pericardium: There is no evidence of pericardial effusion.   Mitral Valve: The mitral valve is grossly normal. Mild mitral valve  regurgitation. No evidence of mitral valve stenosis.   Tricuspid Valve: The tricuspid valve is normal in structure. Tricuspid  valve regurgitation is moderate . No evidence of tricuspid stenosis.   Aortic Valve: The aortic valve is grossly normal. There is mild  calcification of the aortic valve. There is mild thickening of the aortic  valve. Aortic valve regurgitation is trivial. No aortic stenosis is  present.   Pulmonic Valve: The pulmonic valve was normal in structure. Pulmonic valve  regurgitation is trivial. No evidence of pulmonic stenosis.   Aorta: The aortic root is normal in size and structure.   Venous: The inferior vena cava is dilated in size with greater than 50%  respiratory variability, suggesting right atrial pressure of 8 mmHg.   IAS/Shunts: No atrial level shunt detected by color flow Doppler.   Cath: 06/2020  Dominant right coronary which is totally occluded distally.  The distal RCA fills by left to right collaterals from both  LAD and circumflex territory. Patent left main Luminal irregularities in the LAD but no significant obstructive disease. Large first obtuse marginal contains 70 to 80% stenosis in the mid body.  The continuation of the circumflex beyond a small second obtuse marginal is 70 to 80% narrowed. LVEDP is 9 mmHg.  Left ventriculography not performed.   RECOMMENDATIONS:   Outpatient status. Spoke with Dr. Bernardino Gasman, [facility], who will take responsibility for follow-up creatinine in 3 days (at his recommendation), and will arrange it to be performed at his office. Spoke with Dr. Darryle Decent who will follow up with the patient in 1 week.  We previously discussed performing a basic metabolic panel in 48 hours, but after speaking with Dr. Gasman, the follow-up blood work will be by [employer]. Increase medical therapy intensity.  If continued angina, CTO on RCA and possibly stenting the first obtuse marginal and distal circumflex if warranted by symptoms after the patient is on dialysis. Total contrast 21 cc.  Diagnostic Dominance: Right   Laboratory Data: High Sensitivity Troponin:  No results for input(s): TROPONINIHS in the last 720 hours.   Chemistry Recent Labs  Lab 09/08/24 1603 09/08/24 1624 09/08/24 1625  NA 138 137 137  K 3.1* 3.1* 3.1*  CL 93* 94*  --   CO2 26  --   --   GLUCOSE 88 85  --   BUN 15 16  --   CREATININE 3.38* 3.10*  --   CALCIUM  8.2*  --   --   MG 1.8  --   --   [facility]  17*  --   --   ANIONGAP 19*  --   --     Recent Labs  Lab 09/08/24 1603  PROT 6.6  ALBUMIN 2.6*  AST 46*  ALT 39  ALKPHOS 177*  BILITOT 1.6*   Lipids No results for input(s): CHOL, TRIG, HDL, LABVLDL, LDLCALC, CHOLHDL in the last 168 hours.  Hematology Recent Labs  Lab 09/08/24 1603 09/08/24 1624 09/08/24 1625  WBC 9.3  --   --   RBC 3.41*  --   --   HGB 11.2* 11.2* 10.9*  HCT 33.3* 33.0* 32.0*  MCV 97.7  --   --   MCH 32.8   --   --   MCHC 33.6  --   --   RDW 14.8  --   --   PLT 149*  --   --    Thyroid No results for input(s): TSH, FREET4 in the last 168 hours.  BNP Recent Labs  Lab 09/08/24 1603  BNP 270.8*    DDimer No results for input(s): DDIMER in the last 168 hours.  Radiology/Studies:  DG Chest Port 1 View Result Date: 09/08/2024 EXAM: 1 VIEW(S) XRAY OF THE CHEST 09/08/2024 05:18:00 PM COMPARISON: None available. CLINICAL HISTORY: sob, new afib FINDINGS: LUNGS AND PLEURA: Low lung volumes. Right lower lung zone calcified granuloma. Left chest calcified pleural plaques have progressed from prior. No pulmonary edema. No pleural effusion. No pneumothorax. HEART AND MEDIASTINUM: Atherosclerotic plaque. No acute abnormality of the cardiac and mediastinal silhouettes. BONES AND SOFT TISSUES: No acute osseous abnormality. IMPRESSION: 1. No acute cardiopulmonary process identified. 2. Left chest calcified pleural plaques have progressed from prior. Electronically signed by: Norman Gatlin MD 09/08/2024 05:33 PM EST RP Workstation: [id number]     Assessment and Plan:  Mitesh Rosendahl is a 89 y.o. male with a hx of CAD, hypertension, BPH, ESRD on HD, hyperlipidemia who is being seen 09/08/2024 for the evaluation of atrial fibrillation at the request of Dr. Bari.  New onset atrial fibrillation with RVR -- Presented while being at dialysis with hypotension and tachycardia and found to be in new onset atrial fibrillation with RVR -- Heart rates in the 150s to 170s while in the ED, he is hypotensive with systolic pressures in the 80-90 range -- Started on IV amiodarone with 150 mg bolus x 1, rates are still elevated. Will re-bolus 150mg  x1 now  -- Agree with starting on IV heparin  for the time being with eventual transition to DOAC -- Ideally resume home dose metoprolol  tartrate 25 mg  ESRD on HD -- Per nephrology  CAD -- Cardiac cath 06/2020 that showed a dominant RCA which was totally occluded which  filled with left-to-right collaterals, patent left main, luminal irregularities in the LAD but no significant obstructive disease, first OM 70 to 80% stenosis mid vessel and circumflex beyond second OM of 70 to 80% narrowing  -- no anginal symptoms PTA  Hyperlipidemia -- Continue home statin  Hypokalemia -- K+ 3.1, suppl  Risk Assessment/Risk Scores:  CHA2DS2-VASc Score = 4   This indicates a 4.8% annual risk of stroke. The patient's score is based upon: CHF History: 0 HTN History: 1 Diabetes History: 0 Stroke History: 0 Vascular Disease History: 1 Age Score: 2 Gender Score: 0   For questions or updates, please contact [facility] Please consult [email or web address] for contact info under   Signed, Manuelita Rummer, NP  09/08/2024 6:35 PM  Patient seen and examined, note reviewed with the signed Advanced Practice  Provider. I personally reviewed laboratory data, imaging studies and relevant notes. I independently examined the patient and formulated the important aspects of the plan. I have personally discussed the plan with the patient and/or family. Comments or changes to the note/plan are indicated below.  Patient seen and examined at bedside  New onset atrial fibrillation with RVR ESRD on HD Coronary artery disease by cath Hyperlipidemia Electrolyte abnormalities   He has been started on amiodarone drip we will continue this with intermittent boluses to help with his heart rate.  His blood pressure is marginal can add other rate lowering agents.   CHADS2 Vasc is 4 which indicate anticoagulation use.  Will start heparin  drip and transition to oral prior to discharge. CAD no anginal symptoms will continue to monitor. ESRD HD per schedule. Hyperlipidemia - continue with current statin medication.    Cloteal Isaacson DO, MS [facility] Attending Cardiologist [facility]  [address] [place], [place] [address] [phone] Website:  [email or web address]

## 2024-09-08 NOTE — ED Triage Notes (Signed)
 Pt BIB [facility] from [facility] post-dialysis due to dizziness and hypotension.Pt did have 3 1/2 hours treatment completed; unknown how much was fluid was pulled off at that time.  Pt has fistula still accessed from treatment; taped down and clamped at this time.  Family on scene reports patient was feeling sick yesterday.  Normally gets dialysis Tues, Thursday & Saturday.  BP 82/49, HR 150 a-fib RVR, CBG 108 Temp 97.6

## 2024-09-08 NOTE — Progress Notes (Signed)
 PHARMACY - ANTICOAGULATION CONSULT NOTE  Pharmacy Consult for Heparin  Indication: atrial fibrillation  Allergies  Allergen Reactions   Ranexa  [Ranolazine  Er] Other (See Comments)    Myclonus, hallucinations   Atorvastatin     REACTION: arthralgia  Other Reaction(s): Unknown   Lovastatin     REACTION: arthralgia  Other Reaction(s): Unknown   Amoxicillin-Pot Clavulanate Other (See Comments)    Did it involve swelling of the face/tongue/throat, SOB, or low BP? Unknown Did it involve sudden or severe rash/hives, skin peeling, or any reaction on the inside of your mouth or nose? Unknown Did you need to seek medical attention at a hospital or doctor's office? Unknown When did it last happen? patient is unfamiliar with this allergy   If all above answers are "NO", may proceed with cephalosporin use.     Patient Measurements: Height: 5' 6 (167.6 cm) Weight: 68 kg (150 lb) IBW/kg (Calculated) : 63.8 HEPARIN  DW (KG): 68  Vital Signs: BP: 112/65 (11/06 1645) Pulse Rate: 155 (11/06 1645)  Labs: Recent Labs    09/08/24 1603 09/08/24 1624 09/08/24 1625  HGB 11.2* 11.2* 10.9*  HCT 33.3* 33.0* 32.0*  PLT 149*  --   --   CREATININE 3.38* 3.10*  --     Estimated Creatinine Clearance: 14.6 mL/min (A) (by C-G formula based on SCr of 3.1 mg/dL (H)).   Medical History: Past Medical History:  Diagnosis Date   Cancer (HCC)    ESRD on hemodialysis (HCC)    HD at [facility] on TTS schedule, started June 2024   Hypertension     Medications:  Infusions:   amiodarone 60 mg/hr (09/08/24 1634)   Followed by   amiodarone     heparin       Assessment: Patient is a 89 year old male admitted with new onset AF not on anticoagulants prior to admission.   Hgb slightly low on admission at 11.2. PLT 149. No bleeding noted.   Goal of Therapy:  Heparin  level 0.3-0.7 units/ml Monitor platelets by anticoagulation protocol: Yes   Plan:  Give 3400 units bolus x 1, then 950 units/hr.   Heparin  level in 8 hours  Daily CBC   Rebekah Zackery M Montrice Montuori 09/08/2024,5:56 PM

## 2024-09-08 NOTE — ED Notes (Signed)
 Pt back from CT

## 2024-09-08 NOTE — ED Provider Notes (Signed)
 [place] [facility] Provider Note  MDM   HPI/ROS:  Pedro Young is a 89 y.o. male with a PMH ESRD on HD TTS, HTN, cancer, anemia, CAD, HLD, BPH, HTN, depression, gout who presents to the emergency department for dizziness.  History is obtained from patient as well as patient's daughter.  They both report the patient has been feeling generalized weakness, dizziness and overall  unwell since yesterday.  He went to dialysis today, received a full session (3-1/2 hours) but was feeling credibly weak, on vitals check patient was in A-fib RVR and sent here for further evaluation.  This encounter was facilitated with the help of video interpreter.  On my initial evaluation, patient is:  -Vital signs signifant for tachycardia, tachypnea. Patient afebrile, and ill-appearing. -Additional history obtained from patient's daughter at bedside  History/Physical exam is notable for: - Membranes dry, no lower extremity pitting edema, abdomen is soft, nontender, nondistended -- Patient with increased work of breathing, lungs with diffuse crackles, no wheezing  EKG shows A-fib with RVR at a rate of 167.  Bedside POCUS performed and patient had flat IVC, completely compressible.  Unable to determine the EF or wall motion abnormality secondary to tachycardia.  No obvious pleural effusion.  Patient initiated with 500 cc LR bolus given compressible IVC.  Patient's BP still soft even after 500 cc fluid, do not feel comfortable given no confirmed EF and soft blood pressure with pursuing metoprolol  despite this being a home medication.  Amiodarone bolus followed by gtt. ordered.  Given that he meets SIRS criteria as well and is an undifferentiated hemodynamic instability, broad-spectrum antibiotics as well as blood cultures ordered. CMP with potassium of 3.1, creatinine elevated at 3.38, stable lites elevation of alkaline phosphatase at 177 and an anion gap of 19.  CBC without  leukocytosis, hemoglobin stable at 11.2.  Initial VBG 7.504/40.4/31.8. Patient did not initially converted after amiodarone bolus, was placed on amiodarone gtt. and cardiology was consulted.  I spoke with cardiology who at this time would appreciate hospitalist admission with cardiology following.  They recommended the patient continue IV amiodarone with boluses, and IV heparin .  Patient initiated on heparin , and patient's HR improved.  Despite improvement of his HR he did become tachypneic at a rate of 32, saturating 97% on RA.  Patient does not endorse subjective increased work of breathing but is noted to have shallow breaths while resting.  Secondary to his tachypnea and new onset A-fib RVR, CTA PE study ordered to evaluate for secondary causes of acute hypoxia. No evidence of PE.  Diffuse bronchial wall thickening with airway infection versus inflammation.   Patient is thought to require admission for new onset atrial fibrillation, persistent dizziness and tachypnea. Interpretations, interventions, and the patient's course of care are documented below.    Clinical Course as of 09/08/24 1621  Thu Sep 08, 2024  1559 EKG reviewed independently atrial fibrillation with RVR at a rate of 167 [SA]    Clinical Course User Index [SA] Billy Pal, MD    Disposition:  I discussed the case with hospitalist who graciously agreed to admit the patient to their service for continued care.   Clinical Impression: No diagnosis found.  Clinical Complexity A medically appropriate history, review of systems, and physical exam was performed.  My independent interpretations of EKG, labs, and radiology are documented in the ED course above.   If decision rules were used in this patient's evaluation, they are listed below.  Click here for ABCD2, HEART and other calculatorsREFRESH Note before signing   Patient's presentation is most consistent with acute presentation with potential threat to life or  bodily function.  Medical Decision Making Amount and/or Complexity of Data Reviewed Labs: ordered. Radiology: ordered.  Risk Prescription drug management. Decision regarding hospitalization.   HPI/ROS      See MDM section for pertinent HPI and ROS. A complete ROS was performed with pertinent positives/negatives noted above.   Past Medical History:  Diagnosis Date   Cancer [facility])    ESRD on hemodialysis (HCC)    HD at [facility] on TTS schedule, started June 2024   Hypertension     Past Surgical History:  Procedure Laterality Date   AV FISTULA PLACEMENT Left 05/04/2019   Procedure: Arteriovenous (Av) Fistula Creation Left Arm;  Surgeon: Oris Krystal FALCON, MD;  Location: [facility] OR;  Service: Vascular;  Laterality: Left;   BIOPSY  08/10/2019   Procedure: BIOPSY;  Surgeon: Charlanne Groom, MD;  Location: [facility] ENDOSCOPY;  Service: Endoscopy;;   CHOLECYSTECTOMY     COLONOSCOPY N/A 08/10/2019   Procedure: COLONOSCOPY;  Surgeon: Charlanne Groom, MD;  Location: [facility] ENDOSCOPY;  Service: Endoscopy;  Laterality: N/A;   EYE SURGERY Bilateral    cataract   LEFT HEART CATH AND CORONARY ANGIOGRAPHY N/A 06/19/2020   Procedure: LEFT HEART CATH AND CORONARY ANGIOGRAPHY;  Surgeon: Claudene Victory ORN, MD;  Location: [facility] INVASIVE CV LAB;  Service: Cardiovascular;  Laterality: N/A;   POLYPECTOMY  08/10/2019   Procedure: POLYPECTOMY;  Surgeon: Charlanne Groom, MD;  Location: [facility] ENDOSCOPY;  Service: Endoscopy;;      Physical Exam   There were no vitals filed for this visit.  Physical Exam Vitals and nursing note reviewed.  Constitutional:      General: He is in acute distress.     Appearance: He is well-developed. He is ill-appearing.  HENT:     Head: Normocephalic and atraumatic.  Eyes:     Conjunctiva/sclera: Conjunctivae normal.  Cardiovascular:     Rate and Rhythm: Tachycardia present. Rhythm irregular.     Heart sounds: No murmur heard. Pulmonary:     Effort: Respiratory distress present.     Breath sounds:  Normal breath sounds.  Abdominal:     Palpations: Abdomen is soft.     Tenderness: There is no abdominal tenderness.  Musculoskeletal:     Cervical back: Neck supple.  Skin:    Capillary Refill: Capillary refill takes less than 2 seconds.     Coloration: Skin is pale.     Comments: Diaphoresis  Neurological:     Mental Status: He is oriented to person, place, and time.    Procedures   If procedures were preformed on this patient, they are listed below:  Procedures  Please note that this documentation was produced with the assistance of voice-to-text technology and may contain errors.     Billy Pal, MD 09/08/24 2227    Bari Roxie HERO, DO 09/08/24 2320    Bari Roxie HERO, DO 09/08/24 2321

## 2024-09-08 NOTE — Assessment & Plan Note (Signed)
 Soft bp allow permissive htn

## 2024-09-08 NOTE — ED Notes (Signed)
 CCMD called by this RN

## 2024-09-08 NOTE — H&P (Signed)
 Pedro Young FMW:[id number] DOB: 1933/01/31 DOA: 09/08/2024     PCP: Freddrick, No   Outpatient Specialists:   CARDS:   Dr. [name], MD  NEphrology: *  Dr. Betsey Channel, MD  NEurology *   Dr. Pulmonary *  Dr.  Oncology * Dr.No care team member to display  GI* Dr.  Gwen, LB) No care team member to display Urology Dr. *  Patient arrived to ER on 09/08/24 at 1552 Referred by Attending Horton, Roxie HERO, DO   Patient coming from:    home Lives alone,   *** With family From facility *** SN[place], [place], [place]    Chief Complaint:   Chief Complaint  Patient presents with   Dizziness   Hypotension    HPI: Pedro Young is a 89 y.o. male with medical history significant of ESRD on T H sat, HLD, HTN, CAD, BPH    Presented with  palpitaions Came from hd hx of ESSRD on HD t H sat had dizziness palpitations no hx of A.fib Found to have a.fib w RVR HR to 170 - now 140's Cardiology has seen him and rec starting on heparin  and bolus with Amiodarone       Denies significant ETOH intake *** Does not smoke*** but interested in quitting***  Denies marijuana use ***    Regarding pertinent Chronic problems:    Hyperlipidemia -  on statins rosuvastatin .  Lipid Panel     Component Value Date/Time   CHOL 123 04/07/2024 0811   TRIG 74 04/07/2024 0811   HDL 54 04/07/2024 0811   CHOLHDL 2.3 04/07/2024 0811   CHOLHDL 5.3 08/02/2015 1705   VLDL 26 08/02/2015 1705   LDLCALC 54 04/07/2024 0811   LABVLDL 15 04/07/2024 0811    ***HTN on    chronic CHF diastolic  - last echo  Recent Results (from the past 56199 hours)  ECHOCARDIOGRAM COMPLETE   Collection Time: 08/01/20 12:08 PM  Result Value   Area-P 1/2 2.91   S' Lateral 2.30   Narrative      ECHOCARDIOGRAM REPORT      IMPRESSIONS    1. Left ventricular ejection fraction by 3D volume is 66 %. The left ventricle has normal function. The left ventricle demonstrates regional wall motion abnormalities (see scoring  diagram/findings for description). There is mild left ventricular  hypertrophy. Left ventricular diastolic parameters are consistent with Grade I diastolic dysfunction (impaired relaxation). The average left ventricular global longitudinal strain is -19.7 %. The global longitudinal strain is normal.  2. Right ventricular systolic function is normal. The right ventricular size is normal. There is normal pulmonary artery systolic pressure. The estimated right ventricular systolic pressure is 34.4 mmHg.  3. Left atrial size was mildly dilated.  4. The mitral valve is grossly normal. Mild mitral valve regurgitation. No evidence of mitral stenosis.  5. Tricuspid valve regurgitation is moderate.  6. The aortic valve is grossly normal. There is mild calcification of the aortic valve. There is mild thickening of the aortic valve. Aortic valve regurgitation is trivial. No aortic stenosis is present.  7. The inferior vena cava is dilated in size with >50% respiratory variability, suggesting right atrial pressure of 8 mmHg.            CAD  - On aspirin  81 mg daily, Imdur  90 mg daily, metoprolol  tartrate 25 mg twice daily and Crestor  40 mg daily.                 -  followed by cardiology                - last cardiac cath tient underwent LHC in 8/21 and that showed totally occluded RCA, 70 to 80% stenosis in the large first OM, 78% stenosis in left circumflex     ESRD  Lab Results  Component Value Date   CREATININE 3.10 (H) 09/08/2024   CREATININE 3.38 (H) 09/08/2024   CREATININE 5.89 (H) 07/20/2023   Lab Results  Component Value Date   NA 137 09/08/2024   CL 94 (L) 09/08/2024   K 3.1 (L) 09/08/2024   CO2 26 09/08/2024   BUN 16 09/08/2024   CREATININE 3.10 (H) 09/08/2024   GFRNONAA 17 (L) 09/08/2024   CALCIUM  8.2 (L) 09/08/2024   PHOS 3.6 04/21/2023   ALBUMIN 2.6 (L) 09/08/2024   GLUCOSE 85 09/08/2024    Chronic anemia - baseline hg Hemoglobin & Hematocrit  Recent Labs    09/08/24 1603  09/08/24 1624 09/08/24 1625  HGB 11.2* 11.2* 10.9*   Iron/TIBC/Ferritin/ %Sat    Component Value Date/Time   IRON 41 (L) 04/07/2023 0454   TIBC 176 (L) 04/07/2023 0454   FERRITIN 58.3 06/19/2009 0845   IRONPCTSAT 23 04/07/2023 0454      While in ER: Clinical Course as of 09/08/24 1924  Thu Sep 08, 2024  1559 EKG reviewed independently atrial fibrillation with RVR at a rate of 167 [SA]    Clinical Course User Index [SA] Billy Pal, MD         Lab Orders         Magnesium          Brain natriuretic peptide (IF shortness of breath has been documented this visit)         CBC with Differential         Comprehensive metabolic panel         TSH         Heparin  level (unfractionated)         CBC         CBG monitoring, ED         I-Stat venous blood gas, ([facility] ED, [facility], [facility])         I-stat chem 8, ED (not at [facility], [facility] or [facility])        CXR - Left chest calcified pleural plaques have progressed from prior.    Following Medications were ordered in ER: Medications  amiodarone (NEXTERONE) 1.8 mg/mL load via infusion 150 mg (150 mg Intravenous Bolus from Bag 09/08/24 1635)    Followed by  amiodarone (NEXTERONE PREMIX) 360-4.14 MG/200ML-% (1.8 mg/mL) IV infusion (60 mg/hr Intravenous New Bag/Given 09/08/24 1634)    Followed by  amiodarone (NEXTERONE PREMIX) 360-4.14 MG/200ML-% (1.8 mg/mL) IV infusion (has no administration in time range)  heparin  ADULT infusion 100 units/mL (25000 units/250mL) (950 Units/hr Intravenous New Bag/Given 09/08/24 1855)  amiodarone (NEXTERONE) 1.8 mg/mL load via infusion 150 mg (has no administration in time range)  sodium chloride  0.9 % bolus 500 mL (0 mLs Intravenous Stopped 09/08/24 1729)  heparin  bolus via infusion 3,400 Units (3,400 Units Intravenous Bolus from Bag 09/08/24 1856)    _______________________________________________________ ER Provider Called:     Cardiology   DrGOMEZ  They Recommend admit to medicine   Will see in AM  ***SEEN in  ER     ED Triage Vitals  Encounter Vitals Group     BP 09/08/24 1603 (!) 94/57     Girls Systolic BP Percentile --  Girls Diastolic BP Percentile --      Boys Systolic BP Percentile --      Boys Diastolic BP Percentile --      Pulse Rate 09/08/24 1603 (!) 144     Resp 09/08/24 1603 (!) 22     Temp --      Temp src --      SpO2 09/08/24 1645 97 %     Weight 09/08/24 1600 150 lb (68 kg)     Height 09/08/24 1600 5' 6 (1.676 m)     Head Circumference --      Peak Flow --      Pain Score 09/08/24 1600 0     Pain Loc --      Pain Education --      Exclude from Growth Chart --   UFJK(75)@     _________________________________________ Significant initial  Findings: Abnormal Labs Reviewed  BRAIN NATRIURETIC PEPTIDE - Abnormal; Notable for the following components:      Result Value   B Natriuretic Peptide 270.8 (*)    All other components within normal limits  CBC WITH DIFFERENTIAL/PLATELET - Abnormal; Notable for the following components:   RBC 3.41 (*)    Hemoglobin 11.2 (*)    HCT 33.3 (*)    Platelets 149 (*)    Abs Immature Granulocytes 0.12 (*)    All other components within normal limits  COMPREHENSIVE METABOLIC PANEL WITH GFR - Abnormal; Notable for the following components:   Potassium 3.1 (*)    Chloride 93 (*)    Creatinine, Ser 3.38 (*)    Calcium  8.2 (*)    Albumin 2.6 (*)    AST 46 (*)    Alkaline Phosphatase 177 (*)    Total Bilirubin 1.6 (*)    GFR, Estimated 17 (*)    Anion gap 19 (*)    All other components within normal limits  I-STAT VENOUS BLOOD GAS, ED - Abnormal; Notable for the following components:   pH, Ven 7.504 (*)    pCO2, Ven 40.4 (*)    pO2, Ven 25 (*)    Bicarbonate 31.8 (*)    TCO2 33 (*)    Acid-Base Excess 8.0 (*)    Potassium 3.1 (*)    Calcium , Ion 0.99 (*)    HCT 32.0 (*)    Hemoglobin 10.9 (*)    All other components within normal limits  I-STAT CHEM 8, ED - Abnormal; Notable for the following components:   Potassium  3.1 (*)    Chloride 94 (*)    Creatinine, Ser 3.10 (*)    Calcium , Ion 0.97 (*)    Hemoglobin 11.2 (*)    HCT 33.0 (*)    All other components within normal limits      _________________________ Troponin ***ordered Cardiac Panel (last 3 results) No results for input(s): CKTOTAL, CKMB, TROPONINIHS, RELINDX in the last 72 hours.   ECG: Ordered Personally reviewed and interpreted by me showing: HR : 167 Rhythm:Atrial fibrillation with rapid V-rate Borderline right axis deviation Repolarization abnormality, prob rate related Artifact in lead(s) I II III aVR aVL aVF V1 V2 V3 QTC 469  BNP (last 3 results) Recent Labs    09/08/24 1603  BNP 270.8*     The recent clinical data is shown below. Vitals:   09/08/24 1645 09/08/24 1800 09/08/24 1853 09/08/24 1915  BP: 112/65 104/62  99/61  Pulse: (!) 155 (!) 159  (!) 163  Resp: (!) 23 (!) 22  (!)  36  SpO2: 97% 97% 95% 95%  Weight:      Height:        WBC     Component Value Date/Time   WBC 9.3 09/08/2024 1603   LYMPHSABS 1.0 09/08/2024 1603   MONOABS 0.9 09/08/2024 1603   EOSABS 0.1 09/08/2024 1603   BASOSABS 0.0 09/08/2024 1603     Procalcitonin *** Ordered      Results for orders placed or performed during the hospital encounter of 04/18/23  MRSA Next Gen by PCR, Nasal     Status: None   Collection Time: 04/19/23  3:01 AM   Specimen: Nasal Mucosa; Nasal Swab  Result Value Ref Range Status   MRSA by PCR Next Gen NOT DETECTED NOT DETECTED Final    Comment: (NOTE) The GeneXpert MRSA Assay (FDA approved for NASAL specimens only), is one component of a comprehensive MRSA colonization surveillance program. It is not intended to diagnose MRSA infection nor to guide or monitor treatment for MRSA infections. Test performance is not FDA approved in patients less than 70 years old. Performed at [facility], [address] [place], [place] [address]     ABX started Antibiotics Given (last 72 hours)      None        No results found for the last 90 days.    ________________________________________________________________  Arterial ***Venous  Blood Gas result:  pH *** pCO2 ***; pO2 ***;     %O2 Sat ***.  ABG    Component Value Date/Time   HCO3 31.8 (H) 09/08/2024 1625   TCO2 33 (H) 09/08/2024 1625   O2SAT 51 09/08/2024 1625       __________________________________________________________ Recent Labs  Lab 09/08/24 1603 09/08/24 1624 09/08/24 1625  NA 138 137 137  K 3.1* 3.1* 3.1*  CO2 26  --   --   GLUCOSE 88 85  --   BUN 15 16  --   CREATININE 3.38* 3.10*  --   CALCIUM  8.2*  --   --   MG 1.8  --   --     Cr  * stable,  Up from baseline see below Lab Results  Component Value Date   CREATININE 3.10 (H) 09/08/2024   CREATININE 3.38 (H) 09/08/2024   CREATININE 5.89 (H) 07/20/2023    Recent Labs  Lab 09/08/24 1603  AST 46*  ALT 39  ALKPHOS 177*  BILITOT 1.6*  PROT 6.6  ALBUMIN 2.6*   Lab Results  Component Value Date   CALCIUM  8.2 (L) 09/08/2024   PHOS 3.6 04/21/2023          Plt: Lab Results  Component Value Date   PLT 149 (L) 09/08/2024         Recent Labs  Lab 09/08/24 1603 09/08/24 1624 09/08/24 1625  WBC 9.3  --   --   NEUTROABS 7.3  --   --   HGB 11.2* 11.2* 10.9*  HCT 33.3* 33.0* 32.0*  MCV 97.7  --   --   PLT 149*  --   --     HG/HCT * stable,  Down *Up from baseline see below    Component Value Date/Time   HGB 10.9 (L) 09/08/2024 1625   HGB 11.8 (L) 07/20/2023 0000   HCT 32.0 (L) 09/08/2024 1625   HCT 36.2 (L) 07/20/2023 0000   MCV 97.7 09/08/2024 1603   MCV 98 (H) 07/20/2023 0000      No results for input(s): LIPASE, AMYLASE in the  last 168 hours. No results for input(s): AMMONIA in the last 168 hours.    _______________________________________________ Hospitalist was called for admission for *** There are no diagnoses linked to this encounter.   The following Work up has been ordered so  far:  Orders Placed This Encounter  Procedures   DG Chest Port 1 View   Magnesium    Brain natriuretic peptide (IF shortness of breath has been documented this visit)   CBC with Differential   Comprehensive metabolic panel   TSH   Heparin  level (unfractionated)   CBC   Diet NPO time specified   ED Cardiac monitoring   Initiate Carrier Fluid Protocol   Inpatient consult to Cardiology   heparin  per pharmacy consult   Consult for Unassigned Medical Admission   Oxygen therapy Mode or (Route): Other Specify; Comments: [place] or Simple Mask - to keep O2 sats >92%   ED Pulse oximetry, continuous   CBG monitoring, ED   I-Stat venous blood gas, ([facility] ED, [facility], [facility])   I-stat chem 8, ED (not at [facility], [facility] or [facility])   EKG 12-Lead   ED EKG   Insert peripheral IV     OTHER Significant initial  Findings:  labs showing:     DM  labs:  HbA1C: No results for input(s): HGBA1C in the last 8760 hours.     CBG (last 3)  Recent Labs    09/08/24 1601  GLUCAP 83          Cultures:    Component Value Date/Time   SDES URINE, CLEAN CATCH 09/27/2021 1435   SPECREQUEST  09/27/2021 1435    NONE Performed at [facility], [address] [place], [place] [address]    CULT >=100,000 COLONIES/mL ESCHERICHIA COLI (A) 09/27/2021 1435   REPTSTATUS 09/29/2021 FINAL 09/27/2021 1435     Radiological Exams on Admission: DG Chest Port 1 View Result Date: 09/08/2024 EXAM: 1 VIEW(S) XRAY OF THE CHEST 09/08/2024 05:18:00 PM COMPARISON: None available. CLINICAL HISTORY: sob, new afib FINDINGS: LUNGS AND PLEURA: Low lung volumes. Right lower lung zone calcified granuloma. Left chest calcified pleural plaques have progressed from prior. No pulmonary edema. No pleural effusion. No pneumothorax. HEART AND MEDIASTINUM: Atherosclerotic plaque. No acute abnormality of the cardiac and mediastinal silhouettes. BONES AND SOFT TISSUES: No acute osseous abnormality. IMPRESSION: 1. No acute cardiopulmonary process  identified. 2. Left chest calcified pleural plaques have progressed from prior. Electronically signed by: Norman Gatlin MD 09/08/2024 05:33 PM EST RP Workstation: [id number]   _______________________________________________________________________________________________________ Latest  Blood pressure 99/61, pulse (!) 163, resp. rate (!) 36, height 5' 6 (1.676 m), weight 68 kg, SpO2 95%.   Vitals  labs and radiology finding personally reviewed  Review of Systems:    Pertinent positives include: ***  Constitutional:  No weight loss, night sweats, Fevers, chills, fatigue, weight loss  HEENT:  No headaches, Difficulty swallowing,Tooth/dental problems,Sore throat,  No sneezing, itching, ear ache, nasal congestion, post nasal drip,  Cardio-vascular:  No chest pain, Orthopnea, PND, anasarca, dizziness, palpitations.no Bilateral lower extremity swelling  GI:  No heartburn, indigestion, abdominal pain, nausea, vomiting, diarrhea, change in bowel habits, loss of appetite, melena, blood in stool, hematemesis Resp:  no shortness of breath at rest. No dyspnea on exertion, No excess mucus, no productive cough, No non-productive cough, No coughing up of blood.No change in color of mucus.No wheezing. Skin:  no rash or lesions. No jaundice GU:  no dysuria, change in color of urine, no urgency or frequency. No  straining to urinate.  No flank pain.  Musculoskeletal:  No joint pain or no joint swelling. No decreased range of motion. No back pain.  Psych:  No change in mood or affect. No depression or anxiety. No memory loss.  Neuro: no localizing neurological complaints, no tingling, no weakness, no double vision, no gait abnormality, no slurred speech, no confusion  All systems reviewed and apart from HOPI all are negative _______________________________________________________________________________________________ Past Medical History:   Past Medical History:  Diagnosis Date   Cancer (HCC)     ESRD on hemodialysis (HCC)    [facility] at [facility] on TTS schedule, started June 2024   Hypertension       Past Surgical History:  Procedure Laterality Date   AV FISTULA PLACEMENT Left 05/04/2019   Procedure: Arteriovenous (Av) Fistula Creation Left Arm;  Surgeon: Oris Krystal FALCON, MD;  Location: [facility] OR;  Service: Vascular;  Laterality: Left;   BIOPSY  08/10/2019   Procedure: BIOPSY;  Surgeon: Charlanne Groom, MD;  Location: [facility] ENDOSCOPY;  Service: Endoscopy;;   CHOLECYSTECTOMY     COLONOSCOPY N/A 08/10/2019   Procedure: COLONOSCOPY;  Surgeon: Charlanne Groom, MD;  Location: [facility] ENDOSCOPY;  Service: Endoscopy;  Laterality: N/A;   EYE SURGERY Bilateral    cataract   LEFT HEART CATH AND CORONARY ANGIOGRAPHY N/A 06/19/2020   Procedure: LEFT HEART CATH AND CORONARY ANGIOGRAPHY;  Surgeon: Claudene Victory ORN, MD;  Location: [facility] INVASIVE [facility] LAB;  Service: Cardiovascular;  Laterality: N/A;   POLYPECTOMY  08/10/2019   Procedure: POLYPECTOMY;  Surgeon: Charlanne Groom, MD;  Location: [facility] ENDOSCOPY;  Service: Endoscopy;;    Social History:  Ambulatory *** independently cane, walker  wheelchair bound, bed bound     reports that he quit smoking about 69 years ago. His smoking use included cigarettes. He quit smokeless tobacco use about 69 years ago. He reports that he does not drink alcohol and does not use drugs.     Family History: *** Family History  Problem Relation Age of Onset   Liver disease Father    Kidney disease Father    Heart disease Father        before age 67   Heart disease Mother        before age 58   ______________________________________________________________________________________________ Allergies: Allergies  Allergen Reactions   Ranexa  [Ranolazine  Er] Other (See Comments)    Myclonus, hallucinations   Atorvastatin     REACTION: arthralgia  Other Reaction(s): Unknown   Lovastatin     REACTION: arthralgia  Other Reaction(s): Unknown   Amoxicillin-Pot Clavulanate Other (See  Comments)    Did it involve swelling of the face/tongue/throat, SOB, or low BP? Unknown Did it involve sudden or severe rash/hives, skin peeling, or any reaction on the inside of your mouth or nose? Unknown Did you need to seek medical attention at a hospital or doctor's office? Unknown When did it last happen? patient is unfamiliar with this allergy   If all above answers are "NO", may proceed with cephalosporin use.      Prior to Admission medications   Medication Sig Start Date End Date Taking? Authorizing Provider  acetaminophen  (TYLENOL ) 325 MG tablet Take 650 mg by mouth as needed (Dialysis days). Patient not taking: Reported on 03/25/2024    [provider]  aspirin  EC 81 MG tablet Take 1 tablet (81 mg total) by mouth daily. Swallow whole. Patient not taking: Reported on 03/25/2024 08/31/23   Vicci Rollo JONELLE DEVONNA  aspirin  EC 81 MG tablet  Take 1 tablet (81 mg total) by mouth daily. 08/31/23     colchicine  0.6 MG tablet Take 1 tablet (0.6 mg total) by mouth 2 (two) times daily as needed (gout). Patient not taking: Reported on 03/25/2024 04/21/23   Barbarann Nest, MD  diclofenac Sodium (VOLTAREN) 1 % GEL Apply 2 g topically 4 (four) times daily as needed (joint pain). Patient not taking: Reported on 08/31/2023 08/31/21   [provider]  isosorbide  mononitrate (IMDUR ) 60 MG 24 hr tablet Take 1.5 tablets (90 mg total) by mouth daily. 03/25/24   West, Katlyn D, NP  isosorbide  mononitrate (IMDUR ) 60 MG 24 hr tablet Take 1 tablet (60 mg total) by mouth daily. 08/31/23     metoprolol  tartrate (LOPRESSOR ) 25 MG tablet Take 2 tablets (50 mg total) by mouth 2 (two) times daily. Patient taking differently: Take 50 mg by mouth 2 (two) times daily. Once a day 04/21/23   Barbarann Nest, MD  mirtazapine  (REMERON ) 15 MG tablet Take 15 mg by mouth at bedtime.    [provider]  mirtazapine  (REMERON ) 15 MG tablet Take 1 tablet (15 mg total) by mouth daily. 04/25/24    Dolan Mateo Larger, MD  nitroGLYCERIN  (NITROSTAT ) 0.4 MG SL tablet PLACE 1 TABLET UNDER THE TONGUE EVERY 5 MINUTES AS NEEDED FOR CHEST PAIN. Patient not taking: Reported on 03/25/2024 06/22/23   Barbaraann Darryle Ned, MD  rosuvastatin  (CRESTOR ) 10 MG tablet Take 1 tablet (10 mg total) by mouth daily. 05/12/24 08/10/24  West, Katlyn D, NP  rosuvastatin  (CRESTOR ) 10 MG tablet Take 1 tablet (10 mg total) by mouth daily. 05/12/24   West, Katlyn D, NP  rosuvastatin  (CRESTOR ) 40 MG tablet Take 1 tablet (40 mg total) by mouth daily. 08/31/23     sevelamer (RENAGEL) 800 MG tablet Take 1 tablet by mouth 3 (three) times daily with meals. 07/21/23   [provider]  sodium zirconium cyclosilicate (LOKELMA) 10 g PACK packet Mix and drink 1 powder in packet by mouth daily as directed for high potassium. 02/22/24     tamsulosin  (FLOMAX ) 0.4 MG CAPS capsule Take 0.4 mg by mouth daily. 06/14/20   [provider]  tamsulosin  (FLOMAX ) 0.4 MG CAPS capsule Take 1 capsule (0.4 mg total) by mouth daily. 09/11/23       ___________________________________________________________________________________________________ Physical Exam:    09/08/2024    7:15 PM 09/08/2024    6:00 PM 09/08/2024    4:45 PM  Vitals with BMI  Systolic 99 104 112  Diastolic 61 62 65  Pulse 163 159 155     1. General:  in No ***Acute distress***increased work of breathing ***complaining of severe pain****agitated * Chronically ill *well *cachectic *toxic acutely ill -appearing 2. Psychological: Alert and *** Oriented 3. Head/ENT:   Moist *** Dry Mucous Membranes                          Head Non traumatic, neck supple                          Normal *** Poor Dentition 4. SKIN: normal *** decreased Skin turgor,  Skin clean Dry and intact no rash    5. Heart: Regular rate and rhythm no*** Murmur, no Rub or gallop 6. Lungs: ***Clear to auscultation bilaterally, no wheezes or crackles   7. Abdomen: Soft, ***non-tender, Non  distended *** obese ***bowel sounds present 8. Lower extremities: no clubbing, cyanosis, no ***  edema 9. Neurologically Grossly intact, moving all 4 extremities equally *** strength 5 out of 5 in all 4 extremities cranial nerves II through XII intact 10. MSK: Normal range of motion    Chart has been reviewed  ______________________________________________________________________________________________  Assessment/Plan  ***  Admitted for *** There are no diagnoses linked to this encounter.   Present on Admission: **None**     No problem-specific Assessment & Plan notes found for this encounter.    Other plan as per orders.  DVT prophylaxis:  SCD *** Lovenox        Code Status:    Code Status: Prior FULL CODE *** DNR/DNI ***comfort care as per patient ***family  I had personally discussed CODE STATUS with patient and family*  ACP *** none has been reviewed ***   Family Communication:   Family not at  Bedside  plan of care was discussed on the phone with *** Son, Daughter, Wife, Husband, Sister, Brother , father, mother  Diet  Diet Orders (From admission, onward)     Start     Ordered   09/08/24 1612  Diet NPO time specified  Diet effective now        09/08/24 1612            Disposition Plan:   *** likely will need placement for rehabilitation                          Back to current facility when stable                            To home once workup is complete and patient is stable  ***Following barriers for discharge:                             Chest pain *** Stroke *** Syncope ***work up is complete                            Electrolytes corrected                               Anemia corrected h/H stable                             Pain controlled with PO medications                               Afebrile, white count improving able to transition to PO antibiotics                             Will need to be able to tolerate PO                             Will likely need home health, home O2, set up                           Will need consultants to evaluate patient prior to discharge  Work of breathing improves                        ***Would benefit from PT/OT eval prior to DC  Ordered                   Swallow eval - SLP ordered                   Diabetes care coordinator                   Transition of care consulted                   Nutrition    consulted                  Wound care  consulted                   Palliative care    consulted                   Behavioral health  consulted                    Consults called: ***  NONE Treatment Team:  Lbcardiology, Rounding, MD  Admission status:  ED Disposition     ED Disposition  Admit   Condition  --   Comment  The patient appears reasonably stabilized for admission considering the current resources, flow, and capabilities available in the ED at this time, and I doubt any other [facility] requiring further screening and/or treatment in the ED prior to admission is  present.           Obs***  ***  inpatient     I Expect 2 midnight stay secondary to severity of patient's current illness need for inpatient interventions justified by the following: ***hemodynamic instability despite optimal treatment (tachycardia *hypotension * tachypnea *hypoxia, hypercapnia) *** Severe lab/radiological/exam abnormalities including:    There are no diagnoses linked to this encounter. and extensive comorbidities including: *substance abuse  *Chronic pain *DM2  * CHF * CAD  * COPD/asthma *Morbid Obesity * CKD *dementia *liver disease *history of stroke with residual deficits *  malignancy, * sickle cell disease  History of amputation Chronic anticoagulation  That are currently affecting medical management.   I expect  patient to be hospitalized for 2 midnights requiring inpatient medical care.  Patient is at high risk for adverse outcome (such as loss  of life or disability) if not treated.  Indication for inpatient stay as follows:  Severe change from baseline regarding mental status Hemodynamic instability despite maximal medical therapy,  severe pain requiring acute inpatient management,  inability to maintain oral hydration   persistent chest pain despite medical management Need for operative/procedural  intervention New or worsening hypoxia ongoing suicidal ideations   Need for IV antibiotics, IV fluids,, IV pain medications, IV anticoagulation,  IV rate controling medications, IV antihypertensives need for biPAP Frequent labs    Level of care   *** tele  For 12H 24H     medical floor       progressive     stepdown   tele indefinitely please discontinue once patient no longer qualifies COVID-19 Labs    Critical***  Patient is critically ill due to  hemodynamic instability * respiratory failure *severe sepsis* ongoing chest pain*  They are at high risk for life/limb threatening clinical deterioration requiring frequent reassessment  and modifications of care.  Services provided include examination of the patient, review of relevant ancillary tests, prescription of lifesaving therapies, review of medications and prophylactic therapy.  Total critical care time excluding separately billable procedures: 60*  Minutes.    Verdis Koval 09/08/2024, 7:24 PM ***  [facility]     after 2 AM please page floor coverage   If 7AM-7PM, please contact the day team taking care of the patient using [email or web address]

## 2024-09-08 NOTE — ED Notes (Signed)
 IV team at bedside

## 2024-09-09 ENCOUNTER — Other Ambulatory Visit (HOSPITAL_COMMUNITY): Payer: Self-pay

## 2024-09-09 ENCOUNTER — Inpatient Hospital Stay (HOSPITAL_COMMUNITY)

## 2024-09-09 ENCOUNTER — Observation Stay (HOSPITAL_COMMUNITY)

## 2024-09-09 ENCOUNTER — Telehealth (HOSPITAL_COMMUNITY): Payer: Self-pay | Admitting: Pharmacy Technician

## 2024-09-09 DIAGNOSIS — N186 End stage renal disease: Secondary | ICD-10-CM | POA: Diagnosis present

## 2024-09-09 DIAGNOSIS — K861 Other chronic pancreatitis: Secondary | ICD-10-CM | POA: Diagnosis present

## 2024-09-09 DIAGNOSIS — K5733 Diverticulitis of large intestine without perforation or abscess with bleeding: Secondary | ICD-10-CM | POA: Diagnosis present

## 2024-09-09 DIAGNOSIS — I480 Paroxysmal atrial fibrillation: Secondary | ICD-10-CM | POA: Diagnosis not present

## 2024-09-09 DIAGNOSIS — E43 Unspecified severe protein-calorie malnutrition: Secondary | ICD-10-CM | POA: Diagnosis present

## 2024-09-09 DIAGNOSIS — J40 Bronchitis, not specified as acute or chronic: Secondary | ICD-10-CM | POA: Diagnosis present

## 2024-09-09 DIAGNOSIS — Z7189 Other specified counseling: Secondary | ICD-10-CM | POA: Diagnosis not present

## 2024-09-09 DIAGNOSIS — I5032 Chronic diastolic (congestive) heart failure: Secondary | ICD-10-CM | POA: Diagnosis present

## 2024-09-09 DIAGNOSIS — Z66 Do not resuscitate: Secondary | ICD-10-CM | POA: Diagnosis not present

## 2024-09-09 DIAGNOSIS — Z789 Other specified health status: Secondary | ICD-10-CM | POA: Diagnosis not present

## 2024-09-09 DIAGNOSIS — D631 Anemia in chronic kidney disease: Secondary | ICD-10-CM | POA: Diagnosis present

## 2024-09-09 DIAGNOSIS — I81 Portal vein thrombosis: Secondary | ICD-10-CM | POA: Diagnosis present

## 2024-09-09 DIAGNOSIS — Z7901 Long term (current) use of anticoagulants: Secondary | ICD-10-CM | POA: Diagnosis not present

## 2024-09-09 DIAGNOSIS — I132 Hypertensive heart and chronic kidney disease with heart failure and with stage 5 chronic kidney disease, or end stage renal disease: Secondary | ICD-10-CM | POA: Diagnosis present

## 2024-09-09 DIAGNOSIS — E038 Other specified hypothyroidism: Secondary | ICD-10-CM | POA: Diagnosis present

## 2024-09-09 DIAGNOSIS — Z1152 Encounter for screening for COVID-19: Secondary | ICD-10-CM | POA: Diagnosis not present

## 2024-09-09 DIAGNOSIS — R569 Unspecified convulsions: Secondary | ICD-10-CM | POA: Diagnosis present

## 2024-09-09 DIAGNOSIS — I4891 Unspecified atrial fibrillation: Secondary | ICD-10-CM

## 2024-09-09 DIAGNOSIS — E8809 Other disorders of plasma-protein metabolism, not elsewhere classified: Secondary | ICD-10-CM | POA: Diagnosis present

## 2024-09-09 DIAGNOSIS — I959 Hypotension, unspecified: Secondary | ICD-10-CM | POA: Diagnosis present

## 2024-09-09 DIAGNOSIS — I4819 Other persistent atrial fibrillation: Secondary | ICD-10-CM | POA: Diagnosis present

## 2024-09-09 DIAGNOSIS — Z681 Body mass index (BMI) 19 or less, adult: Secondary | ICD-10-CM | POA: Diagnosis not present

## 2024-09-09 DIAGNOSIS — K769 Liver disease, unspecified: Secondary | ICD-10-CM | POA: Diagnosis present

## 2024-09-09 DIAGNOSIS — N2581 Secondary hyperparathyroidism of renal origin: Secondary | ICD-10-CM | POA: Diagnosis present

## 2024-09-09 DIAGNOSIS — Z515 Encounter for palliative care: Secondary | ICD-10-CM | POA: Diagnosis not present

## 2024-09-09 DIAGNOSIS — J189 Pneumonia, unspecified organism: Secondary | ICD-10-CM | POA: Diagnosis present

## 2024-09-09 DIAGNOSIS — G9341 Metabolic encephalopathy: Secondary | ICD-10-CM | POA: Diagnosis present

## 2024-09-09 DIAGNOSIS — Z992 Dependence on renal dialysis: Secondary | ICD-10-CM | POA: Diagnosis not present

## 2024-09-09 DIAGNOSIS — A419 Sepsis, unspecified organism: Secondary | ICD-10-CM | POA: Diagnosis present

## 2024-09-09 LAB — CBC
HCT: 29.7 % — ABNORMAL LOW (ref 39.0–52.0)
Hemoglobin: 9.7 g/dL — ABNORMAL LOW (ref 13.0–17.0)
MCH: 32.6 pg (ref 26.0–34.0)
MCHC: 32.7 g/dL (ref 30.0–36.0)
MCV: 99.7 fL (ref 80.0–100.0)
Platelets: 146 K/uL — ABNORMAL LOW (ref 150–400)
RBC: 2.98 MIL/uL — ABNORMAL LOW (ref 4.22–5.81)
RDW: 15.1 % (ref 11.5–15.5)
WBC: 10.1 K/uL (ref 4.0–10.5)
nRBC: 0 % (ref 0.0–0.2)

## 2024-09-09 LAB — TROPONIN I (HIGH SENSITIVITY)
Troponin I (High Sensitivity): 2621 ng/L (ref <18)
Troponin I (High Sensitivity): 3508 ng/L (ref <18)

## 2024-09-09 LAB — COMPREHENSIVE METABOLIC PANEL WITH GFR
ALT: 37 U/L (ref 0–44)
AST: 49 U/L — ABNORMAL HIGH (ref 15–41)
Albumin: 2.1 g/dL — ABNORMAL LOW (ref 3.5–5.0)
Alkaline Phosphatase: 171 U/L — ABNORMAL HIGH (ref 38–126)
Anion gap: 15 (ref 5–15)
BUN: 25 mg/dL — ABNORMAL HIGH (ref 8–23)
CO2: 25 mmol/L (ref 22–32)
Calcium: 7.3 mg/dL — ABNORMAL LOW (ref 8.9–10.3)
Chloride: 92 mmol/L — ABNORMAL LOW (ref 98–111)
Creatinine, Ser: 4.55 mg/dL — ABNORMAL HIGH (ref 0.61–1.24)
GFR, Estimated: 12 mL/min — ABNORMAL LOW (ref >60)
Glucose, Bld: 95 mg/dL (ref 70–99)
Potassium: 4.2 mmol/L (ref 3.5–5.1)
Sodium: 132 mmol/L — ABNORMAL LOW (ref 135–145)
Total Bilirubin: 1.4 mg/dL — ABNORMAL HIGH (ref 0.0–1.2)
Total Protein: 5.6 g/dL — ABNORMAL LOW (ref 6.5–8.1)

## 2024-09-09 LAB — ECHOCARDIOGRAM COMPLETE
Area-P 1/2: 3.99 cm2
Height: 66 in
MV M vel: 4.06 m/s
MV Peak grad: 65.9 mmHg
S' Lateral: 2.9 cm
Single Plane A4C EF: 52.2 %
Weight: 2400 [oz_av]

## 2024-09-09 LAB — HEPARIN LEVEL (UNFRACTIONATED): Heparin Unfractionated: 0.17 [IU]/mL — ABNORMAL LOW (ref 0.30–0.70)

## 2024-09-09 LAB — MAGNESIUM: Magnesium: 1.7 mg/dL (ref 1.7–2.4)

## 2024-09-09 LAB — HEPATITIS B SURFACE ANTIGEN: Hepatitis B Surface Ag: NONREACTIVE

## 2024-09-09 LAB — PHOSPHORUS: Phosphorus: 3 mg/dL (ref 2.5–4.6)

## 2024-09-09 MED ORDER — ACETAMINOPHEN 650 MG RE SUPP
650.0000 mg | Freq: Four times a day (QID) | RECTAL | Status: DC | PRN
  Administered 2024-09-09 – 2024-09-14 (×2): 650 mg via RECTAL
  Filled 2024-09-09 (×2): qty 1

## 2024-09-09 MED ORDER — ROSUVASTATIN CALCIUM 5 MG PO TABS
10.0000 mg | ORAL_TABLET | Freq: Every day | ORAL | Status: DC
  Administered 2024-09-09 – 2024-09-14 (×6): 10 mg via ORAL
  Filled 2024-09-09 (×6): qty 2

## 2024-09-09 MED ORDER — SODIUM CHLORIDE 0.9 % IV SOLN: 250.0000 mL | INTRAVENOUS | Status: AC | PRN

## 2024-09-09 MED ORDER — TAMSULOSIN HCL 0.4 MG PO CAPS
0.4000 mg | ORAL_CAPSULE | Freq: Every day | ORAL | Status: DC
  Administered 2024-09-09 – 2024-09-23 (×15): 0.4 mg via ORAL
  Filled 2024-09-09 (×15): qty 1

## 2024-09-09 MED ORDER — HYDROCODONE-ACETAMINOPHEN 5-325 MG PO TABS
1.0000 | ORAL_TABLET | ORAL | Status: DC | PRN
  Administered 2024-09-14: 2 via ORAL
  Filled 2024-09-09: qty 2

## 2024-09-09 MED ORDER — RENA-VITE PO TABS
1.0000 | ORAL_TABLET | Freq: Every day | ORAL | Status: DC
  Administered 2024-09-09 – 2024-09-12 (×4): 1 via ORAL
  Filled 2024-09-09 (×5): qty 1

## 2024-09-09 MED ORDER — CHLORHEXIDINE GLUCONATE CLOTH 2 % EX PADS
6.0000 | MEDICATED_PAD | Freq: Every day | CUTANEOUS | Status: DC
  Administered 2024-09-10: 6 via TOPICAL

## 2024-09-09 MED ORDER — ONDANSETRON HCL 4 MG/2ML IJ SOLN: 4.0000 mg | Freq: Four times a day (QID) | INTRAMUSCULAR | Status: DC | PRN

## 2024-09-09 MED ORDER — MIDODRINE HCL 5 MG PO TABS
5.0000 mg | ORAL_TABLET | Freq: Three times a day (TID) | ORAL | Status: DC
  Administered 2024-09-09 – 2024-09-14 (×13): 5 mg via ORAL
  Filled 2024-09-09 (×16): qty 1

## 2024-09-09 MED ORDER — ACETAMINOPHEN 325 MG PO TABS
650.0000 mg | ORAL_TABLET | Freq: Four times a day (QID) | ORAL | Status: DC | PRN
  Administered 2024-09-10 – 2024-09-12 (×3): 650 mg via ORAL
  Filled 2024-09-09 (×4): qty 2

## 2024-09-09 MED ORDER — MAGNESIUM SULFATE 2 GM/50ML IV SOLN
2.0000 g | Freq: Once | INTRAVENOUS | Status: AC
  Administered 2024-09-10: 2 g via INTRAVENOUS
  Filled 2024-09-09: qty 50

## 2024-09-09 MED ORDER — CALCITRIOL 0.25 MCG PO CAPS
0.2500 ug | ORAL_CAPSULE | ORAL | Status: DC
  Administered 2024-09-10: 0.25 ug via ORAL
  Filled 2024-09-09 (×2): qty 1

## 2024-09-09 MED ORDER — FENTANYL CITRATE (PF) 50 MCG/ML IJ SOSY: 12.5000 ug | PREFILLED_SYRINGE | INTRAMUSCULAR | Status: DC | PRN

## 2024-09-09 MED ORDER — SODIUM CHLORIDE 0.9% FLUSH
3.0000 mL | Freq: Two times a day (BID) | INTRAVENOUS | Status: DC
  Administered 2024-09-09 – 2024-09-17 (×16): 3 mL via INTRAVENOUS

## 2024-09-09 MED ORDER — NEPRO/CARBSTEADY PO LIQD
237.0000 mL | Freq: Two times a day (BID) | ORAL | Status: DC
  Administered 2024-09-09 – 2024-09-14 (×9): 237 mL via ORAL

## 2024-09-09 MED ORDER — PROSOURCE PLUS PO LIQD: 30.0000 mL | Freq: Two times a day (BID) | ORAL | Status: DC

## 2024-09-09 MED ORDER — ONDANSETRON HCL 4 MG PO TABS: 4.0000 mg | ORAL_TABLET | Freq: Four times a day (QID) | ORAL | Status: DC | PRN

## 2024-09-09 MED ORDER — SEVELAMER CARBONATE 800 MG PO TABS
800.0000 mg | ORAL_TABLET | Freq: Three times a day (TID) | ORAL | Status: DC
  Administered 2024-09-09 – 2024-09-14 (×10): 800 mg via ORAL
  Filled 2024-09-09 (×13): qty 1

## 2024-09-09 MED ORDER — AMIODARONE HCL 200 MG PO TABS
200.0000 mg | ORAL_TABLET | Freq: Two times a day (BID) | ORAL | Status: AC
  Administered 2024-09-09 – 2024-09-12 (×7): 200 mg via ORAL
  Filled 2024-09-09 (×8): qty 1

## 2024-09-09 MED ORDER — APIXABAN 2.5 MG PO TABS
2.5000 mg | ORAL_TABLET | Freq: Two times a day (BID) | ORAL | Status: DC
Start: 2024-09-09 — End: 2024-09-12
  Administered 2024-09-09 – 2024-09-12 (×7): 2.5 mg via ORAL
  Filled 2024-09-09 (×7): qty 1

## 2024-09-09 MED ORDER — HEPARIN BOLUS VIA INFUSION
2000.0000 [IU] | Freq: Once | INTRAVENOUS | Status: AC
  Administered 2024-09-09: 2000 [IU] via INTRAVENOUS
  Filled 2024-09-09: qty 2000

## 2024-09-09 MED ORDER — SODIUM CHLORIDE 0.9% FLUSH: 3.0000 mL | INTRAVENOUS | Status: DC | PRN

## 2024-09-09 NOTE — Progress Notes (Signed)
 PHARMACY - ANTICOAGULATION CONSULT NOTE  Pharmacy Consult for heparin  Indication: atrial fibrillation  Labs: Recent Labs    09/08/24 1603 09/08/24 1624 09/08/24 1625 09/08/24 2325 09/09/24 0311  HGB 11.2* 11.2* 10.9*  --  9.7*  HCT 33.3* 33.0* 32.0*  --  29.7*  PLT 149*  --   --   --  146*  HEPARINUNFRC  --   --   --   --  0.17*  CREATININE 3.38* 3.10*  --   --   --   TROPONINIHS  --   --   --  2,621*  --    Assessment: 89yo male subtherapeutic on heparin  with initial dosing for new Afib; no infusion issues or signs of bleeding per RN.  Goal of Therapy:  Heparin  level 0.3-0.7 units/ml   Plan:  2000 units heparin  bolus. Increase heparin  infusion by 3 units/kg/hr to 1150 units/hr. Check level in 8 hours.   Marvetta Dauphin, PharmD, BCPS 09/09/2024 4:04 AM

## 2024-09-09 NOTE — Consult Note (Addendum)
 [facility] Renal Consultation Note    Indication for Consultation:  Management of ESRD/hemodialysis; anemia, hypertension/volume and secondary hyperparathyroidism   HPI: Pedro Young is a 89 y.o. male with ESRD on HD TTS, HTN, CAD. He is admitted with new onset Afib. Presented to the ED with dizziness and hypotension after HD. Found to have AFib with RVR to 170s. SBPs 90s-100s on arrival. Cardiology consulted and patient started on IV heparin  and amiodarone. Converted to NSR this am and was transitioned to PO amiodarone and Eliquis.  Labs: Na 132, K 4.2, Cr 4.55, Hgb 9.7, HS troponin 2621 > 5308   Seen and examined at bedside. On 2L [place]. Shivering in bed. Just finished with PT and we  put him back under covers. He does feel cold. He denies chest pain, palpitations, shortness of breath.   Dialysis TTS at [facility]. He did complete a full treatment yesterday. There was no UF because he was under his dry weight. He has not missed dialysis. Dialysis via AVF.   Past Medical History:  Diagnosis Date   Cancer [facility])    ESRD on hemodialysis (HCC)    HD at [facility] on TTS schedule, started June 2024   Hypertension    Past Surgical History:  Procedure Laterality Date   AV FISTULA PLACEMENT Left 05/04/2019   Procedure: Arteriovenous (Av) Fistula Creation Left Arm;  Surgeon: Oris Krystal FALCON, MD;  Location: [facility] OR;  Service: Vascular;  Laterality: Left;   BIOPSY  08/10/2019   Procedure: BIOPSY;  Surgeon: Charlanne Groom, MD;  Location: [facility] ENDOSCOPY;  Service: Endoscopy;;   CHOLECYSTECTOMY     COLONOSCOPY N/A 08/10/2019   Procedure: COLONOSCOPY;  Surgeon: Charlanne Groom, MD;  Location: [facility] ENDOSCOPY;  Service: Endoscopy;  Laterality: N/A;   EYE SURGERY Bilateral    cataract   LEFT HEART CATH AND CORONARY ANGIOGRAPHY N/A 06/19/2020   Procedure: LEFT HEART CATH AND CORONARY ANGIOGRAPHY;  Surgeon: Claudene Victory ORN, MD;  Location: [facility] INVASIVE CV LAB;  Service: Cardiovascular;  Laterality: N/A;   POLYPECTOMY   08/10/2019   Procedure: POLYPECTOMY;  Surgeon: Charlanne Groom, MD;  Location: [facility] ENDOSCOPY;  Service: Endoscopy;;   Family History  Problem Relation Age of Onset   Liver disease Father    Kidney disease Father    Heart disease Father        before age 32   Heart disease Mother        before age 45   Social History:  reports that he quit smoking about 69 years ago. His smoking use included cigarettes. He quit smokeless tobacco use about 69 years ago. He reports that he does not drink alcohol and does not use drugs. Allergies  Allergen Reactions   Ranexa  [Ranolazine  Er] Other (See Comments)    Myclonus, hallucinations   Atorvastatin     REACTION: arthralgia  Other Reaction(s): Unknown   Lovastatin     REACTION: arthralgia  Other Reaction(s): Unknown   Amoxicillin-Pot Clavulanate Other (See Comments)    Did it involve swelling of the face/tongue/throat, SOB, or low BP? Unknown Did it involve sudden or severe rash/hives, skin peeling, or any reaction on the inside of your mouth or nose? Unknown Did you need to seek medical attention at a hospital or doctor's office? Unknown When did it last happen? patient is unfamiliar with this allergy   If all above answers are "NO", may proceed with cephalosporin use.    Prior to Admission medications   Medication Sig Start Date End  Date Taking? Authorizing Provider  isosorbide  mononitrate (IMDUR ) 60 MG 24 hr tablet Take 1 tablet (60 mg total) by mouth daily. 08/31/23  Yes   mirtazapine  (REMERON ) 15 MG tablet Take 1 tablet (15 mg total) by mouth daily. 04/25/24  Yes Dolan Mateo Larger, MD  rosuvastatin  (CRESTOR ) 10 MG tablet Take 1 tablet (10 mg total) by mouth daily. 05/12/24  Yes West, Katlyn D, NP  sevelamer (RENAGEL) 800 MG tablet Take 1 tablet by mouth 3 (three) times daily with meals. Three time a day on non dialysis day 07/21/23  Yes [provider]  sodium zirconium cyclosilicate (LOKELMA) 10 g PACK packet Mix and drink 1  powder in packet by mouth daily as directed for high potassium. Patient taking differently: Take 10 g by mouth as directed. Once a day on non dialysis days 02/22/24  Yes   tamsulosin  (FLOMAX ) 0.4 MG CAPS capsule Take 0.4 mg by mouth daily. 06/14/20  Yes [provider]  acetaminophen  (TYLENOL ) 325 MG tablet Take 650 mg by mouth as needed (Dialysis days). Patient not taking: Reported on 03/25/2024    [provider]  aspirin  EC 81 MG tablet Take 1 tablet (81 mg total) by mouth daily. Swallow whole. Patient not taking: Reported on 03/25/2024 08/31/23   Vicci Rollo SAUNDERS, PA-C  aspirin  EC 81 MG tablet Take 1 tablet (81 mg total) by mouth daily. Patient not taking: Reported on 09/08/2024 08/31/23     colchicine  0.6 MG tablet Take 1 tablet (0.6 mg total) by mouth 2 (two) times daily as needed (gout). Patient not taking: Reported on 03/25/2024 04/21/23   Barbarann Nest, MD  diclofenac Sodium (VOLTAREN) 1 % GEL Apply 2 g topically 4 (four) times daily as needed (joint pain). Patient not taking: Reported on 08/31/2023 08/31/21   [provider]  isosorbide  mononitrate (IMDUR ) 60 MG 24 hr tablet Take 1.5 tablets (90 mg total) by mouth daily. Patient not taking: Reported on 09/08/2024 03/25/24   West, Katlyn D, NP  metoprolol  tartrate (LOPRESSOR ) 25 MG tablet Take 2 tablets (50 mg total) by mouth 2 (two) times daily. Patient taking differently: Take 50 mg by mouth 2 (two) times daily. Once a day 04/21/23   Barbarann Nest, MD  mirtazapine  (REMERON ) 15 MG tablet Take 15 mg by mouth at bedtime. Patient not taking: Reported on 09/08/2024    [provider]  nitroGLYCERIN  (NITROSTAT ) 0.4 MG SL tablet PLACE 1 TABLET UNDER THE TONGUE EVERY 5 MINUTES AS NEEDED FOR CHEST PAIN. Patient not taking: Reported on 03/25/2024 06/22/23   Barbaraann Darryle Ned, MD  rosuvastatin  (CRESTOR ) 10 MG tablet Take 1 tablet (10 mg total) by mouth daily. Patient not taking: Reported on 09/08/2024 05/12/24  08/10/24  West, Katlyn D, NP  rosuvastatin  (CRESTOR ) 40 MG tablet Take 1 tablet (40 mg total) by mouth daily. Patient not taking: Reported on 09/08/2024 08/31/23     tamsulosin  (FLOMAX ) 0.4 MG CAPS capsule Take 1 capsule (0.4 mg total) by mouth daily. Patient not taking: Reported on 09/08/2024 09/11/23      Current Facility-Administered Medications  Medication Dose Route Frequency Provider Last Rate Last Admin   0.9 %  sodium chloride  infusion  250 mL Intravenous PRN Doutova, Anastassia, MD       acetaminophen  (TYLENOL ) tablet 650 mg  650 mg Oral Q6H PRN Doutova, Anastassia, MD       Or   acetaminophen  (TYLENOL ) suppository 650 mg  650 mg Rectal Q6H PRN Doutova, Anastassia, MD  amiodarone (PACERONE) tablet 200 mg  200 mg Oral BID Garrick Leontine SAILOR, PA-C   200 mg at 09/09/24 1304   apixaban (ELIQUIS) tablet 2.5 mg  2.5 mg Oral BID Garrick Leontine SAILOR, PA-C   2.5 mg at 09/09/24 1304   feeding supplement (NEPRO CARB STEADY) liquid 237 mL  237 mL Oral BID BM Dibia, Pauline E, MD       fentaNYL  (SUBLIMAZE ) injection 12.5-50 mcg  12.5-50 mcg Intravenous Q2H PRN Doutova, Anastassia, MD       HYDROcodone-acetaminophen  (NORCO/VICODIN) 5-325 MG per tablet 1-2 tablet  1-2 tablet Oral Q4H PRN Doutova, Anastassia, MD       midodrine (PROAMATINE) tablet 5 mg  5 mg Oral TID WC Doutova, Anastassia, MD   5 mg at 09/09/24 1304   multivitamin (RENA-VIT) tablet 1 tablet  1 tablet Oral QHS Dibia, Pauline E, MD       ondansetron  (ZOFRAN ) tablet 4 mg  4 mg Oral Q6H PRN Doutova, Anastassia, MD       Or   ondansetron  (ZOFRAN ) injection 4 mg  4 mg Intravenous Q6H PRN Doutova, Anastassia, MD       rosuvastatin  (CRESTOR ) tablet 10 mg  10 mg Oral Daily Doutova, Anastassia, MD   10 mg at 09/09/24 0902   sevelamer carbonate (RENVELA) tablet 800 mg  800 mg Oral TID WC Doutova, Anastassia, MD   800 mg at 09/09/24 1304   sodium chloride  flush (NS) 0.9 % injection 3 mL  3 mL Intravenous Q12H Doutova, Anastassia, MD   3 mL at  09/09/24 1008   sodium chloride  flush (NS) 0.9 % injection 3 mL  3 mL Intravenous PRN Doutova, Anastassia, MD       tamsulosin  (FLOMAX ) capsule 0.4 mg  0.4 mg Oral Daily Doutova, Anastassia, MD   0.4 mg at 09/09/24 0902     ROS: As per HPI otherwise negative.  Physical Exam: Vitals:   09/09/24 1130 09/09/24 1145 09/09/24 1200 09/09/24 1233  BP: (!) 99/39 (!) 92/42 (!) 96/40 (!) 115/55  Pulse: 84 84 85 88  Resp: (!) 29 (!) 30 (!) 31   Temp:    99.3 F (37.4 C)  TempSrc:    Oral  SpO2: 98% 97% 100% 100%  Weight:    53.1 kg  Height:    5' 6 (1.676 m)     General: Appears comfortable, in no distress, on nasal oxygen  Head: NCAT sclera not icteric MMM CV: Regular rate, no murmur, no rub  Pulm: normal respiratory effort, lungs clear  Abdomen: non-tender, no masses  Lower extremities: no edema or ischemic changes, no open wounds  Neuro: A & O X 3. Moves all extremities spontaneously. Psych:  Responds to questions appropriately with a normal affect. Dialysis Access: LUE AVF +bruit   Labs: Basic Metabolic Panel: Recent Labs  Lab 09/08/24 1603 09/08/24 1624 09/08/24 1625 09/09/24 0311  NA 138 137 137 132*  K 3.1* 3.1* 3.1* 4.2  CL 93* 94*  --  92*  CO2 26  --   --  25  GLUCOSE 88 85  --  95  BUN 15 16  --  25*  CREATININE 3.38* 3.10*  --  4.55*  CALCIUM  8.2*  --   --  7.3*  PHOS  --   --   --  3.0   Liver Function Tests: Recent Labs  Lab 09/08/24 1603 09/09/24 0311  AST 46* 49*  ALT 39 37  ALKPHOS 177* 171*  BILITOT 1.6* 1.4*  PROT 6.6 5.6*  ALBUMIN 2.6* 2.1*   No results for input(s): LIPASE, AMYLASE in the last 168 hours. No results for input(s): AMMONIA in the last 168 hours. CBC: Recent Labs  Lab 09/08/24 1603 09/08/24 1624 09/08/24 1625 09/09/24 0311  WBC 9.3  --   --  10.1  NEUTROABS 7.3  --   --   --   HGB 11.2* 11.2* 10.9* 9.7*  HCT 33.3* 33.0* 32.0* 29.7*  MCV 97.7  --   --  99.7  PLT 149*  --   --  146*   Cardiac Enzymes: No results  for input(s): CKTOTAL, CKMB, CKMBINDEX, TROPONINI in the last 168 hours. CBG: Recent Labs  Lab 09/08/24 1601  GLUCAP 83   Iron Studies: No results for input(s): IRON, TIBC, TRANSFERRIN, FERRITIN in the last 72 hours. Studies/Results: ECHOCARDIOGRAM COMPLETE Result Date: 09/09/2024    ECHOCARDIOGRAM REPORT   Patient Name:   Pedro Young Date of Exam: 09/09/2024 Medical Rec #:  [id number]       Height:       66.0 in Accession #:    [id number]      Weight:       150.0 lb Date of Birth:  1932/11/17        BSA:          1.770 m Patient Age:    89 years        BP:           117/49 mmHg Patient Gender: M               HR:           86 bpm. Exam Location:  Inpatient Procedure: 2D Echo, Cardiac Doppler and Color Doppler (Both Spectral and Color            Flow Doppler were utilized during procedure). Indications:    Atrial Fibrillation I48.91  History:        Patient has prior history of Echocardiogram examinations, most                 recent 08/01/2020. CAD, Arrythmias:hx of AFIB; Risk                 Factors:Hypertension and Former Smoker.  Sonographer:    Koleen Popper RDCS Referring Phys: [id number] ANASTASSIA DOUTOVA IMPRESSIONS  1. Left ventricular ejection fraction, by estimation, is 60 to 65%. The left ventricle has normal function. The left ventricle has no regional wall motion abnormalities. There is mild left ventricular hypertrophy of the basal-septal segment. Left ventricular diastolic parameters are indeterminate.  2. Right ventricular systolic function is normal. The right ventricular size is normal. There is mildly elevated pulmonary artery systolic pressure. The estimated right ventricular systolic pressure is 39.1 mmHg.  3. Small echogenic density noted on the posterior mitral valve leaflet. Noted on apical 4 chamber (image 43 and 44) and apical 2 chamber (image 75 and 76) on the atrial side. The differential diagnosis includes reverberation artifact from MAC, leaflet calcification,  healed thrombus/vegetation, etc. This was not present on prior transthoracic echocardiogram from September 2021. Clinical correlation required.. The mitral valve is degenerative. Mild mitral valve regurgitation. No evidence of mitral stenosis.  4. The aortic valve is tricuspid. Aortic valve regurgitation is not visualized. Aortic valve sclerosis is present, with no evidence of aortic valve stenosis.  5. The inferior vena cava is normal in size with <50% respiratory variability, suggesting right atrial pressure of 8 mmHg. Comparison(s): A prior study was  performed on 08/01/2020. Prior images reviewed side by side. LVEF 66%, grade 1 diastolic dysfunction, mild LAE, mild MR, moderate TR, estimated RAP 8 mmHg. Conclusion(s)/Recommendation(s): Consider transesophageal echocardiogram to further evaluate the mitral valve, clinical correlation required. See above. FINDINGS  Left Ventricle: Left ventricular ejection fraction, by estimation, is 60 to 65%. The left ventricle has normal function. The left ventricle has no regional wall motion abnormalities. The left ventricular internal cavity size was normal in size. There is  mild left ventricular hypertrophy of the basal-septal segment. Left ventricular diastolic parameters are indeterminate. Right Ventricle: The right ventricular size is normal. No increase in right ventricular wall thickness. Right ventricular systolic function is normal. There is mildly elevated pulmonary artery systolic pressure. The tricuspid regurgitant velocity is 2.79  m/s, and with an assumed right atrial pressure of 8 mmHg, the estimated right ventricular systolic pressure is 39.1 mmHg. Left Atrium: Left atrial size was normal in size. Right Atrium: Right atrial size was normal in size. Pericardium: There is no evidence of pericardial effusion. Mitral Valve: Small echogenic density noted on the posterior mitral valve leaflet. Noted on apical 4 chamber (image 43 and 44) and apical 2 chamber (image  75 and 76) on the atrial side. The differential diagnosis includes reverberation artifact from MAC,  leaflet calcification, healed thrombus/vegetation, etc. This was not present on prior transthoracic echocardiogram from September 2021. Clinical correlation required. The mitral valve is degenerative in appearance. There is mild thickening of the mitral  valve leaflet(s). There is mild calcification of the mitral valve leaflet(s). Mild to moderate mitral annular calcification. Mild mitral valve regurgitation. No evidence of mitral valve stenosis. Tricuspid Valve: The tricuspid valve is normal in structure. Tricuspid valve regurgitation is mild . No evidence of tricuspid stenosis. Aortic Valve: The aortic valve is tricuspid. Aortic valve regurgitation is not visualized. Aortic valve sclerosis is present, with no evidence of aortic valve stenosis. Pulmonic Valve: The pulmonic valve was not well visualized. Pulmonic valve regurgitation is not visualized. Aorta: The aortic root and ascending aorta are structurally normal, with no evidence of dilitation. Venous: The inferior vena cava is normal in size with less than 50% respiratory variability, suggesting right atrial pressure of 8 mmHg. IAS/Shunts: The atrial septum is grossly normal.  LEFT VENTRICLE PLAX 2D LVIDd:         4.10 cm      Diastology LVIDs:         2.90 cm      LV e' medial:    8.05 cm/s LV PW:         1.10 cm      LV E/e' medial:  11.1 LV IVS:        1.40 cm      LV e' lateral:   8.92 cm/s LVOT diam:     1.80 cm      LV E/e' lateral: 10.0 LV SV:         53 LV SV Index:   30 LVOT Area:     2.54 cm  LV Volumes (MOD) LV vol d, MOD A4C: 121.0 ml LV vol s, MOD A4C: 57.8 ml LV SV MOD A4C:     121.0 ml RIGHT VENTRICLE             IVC RV Basal diam:  3.31 cm     IVC diam: 2.04 cm RV S prime:     23.60 cm/s TAPSE (M-mode): 2.2 cm LEFT ATRIUM  Index        RIGHT ATRIUM           Index LA diam:        3.72 cm 2.10 cm/m   RA Area:     13.10 cm LA Vol  (A2C):   34.0 ml 19.21 ml/m  RA Volume:   25.00 ml  14.13 ml/m LA Vol (A4C):   27.6 ml 15.60 ml/m LA Biplane Vol: 31.9 ml 18.03 ml/m  AORTIC VALVE LVOT Vmax:   120.00 cm/s LVOT Vmean:  80.300 cm/s LVOT VTI:    0.208 m  AORTA Ao Root diam: 3.42 cm Ao Asc diam:  3.60 cm MITRAL VALVE                TRICUSPID VALVE MV Area (PHT): 3.99 cm     TR Peak grad:   31.1 mmHg MV Decel Time: 190 msec     TR Vmax:        279.00 cm/s MR Peak grad: 65.9 mmHg MR Vmax:      406.00 cm/s   SHUNTS MV E velocity: 89.10 cm/s   Systemic VTI:  0.21 m MV A velocity: 105.00 cm/s  Systemic Diam: 1.80 cm MV E/A ratio:  0.85 Sunit Tolia Electronically signed by Madonna Large Signature Date/Time: 09/09/2024/12:48:50 PM    Final    CT Angio Chest PE W and/or Wo Contrast Result Date: 09/08/2024 EXAM: CTA of the Chest with contrast for PE 09/08/2024 08:53:33 PM TECHNIQUE: CTA of the chest was performed after the administration of 75 mL of iohexol  (OMNIPAQUE ) 350 MG/ML injection. Multiplanar reformatted images are provided for review. MIP images are provided for review. Automated exposure control, iterative reconstruction, and/or weight based adjustment of the mA/kV was utilized to reduce the radiation dose to as low as reasonably achievable. COMPARISON: Comparison with same day chest x-ray and CT 3/ 19 / 11. CLINICAL HISTORY: Pulmonary embolism (PE) suspected, high prob. Including shortness of breath and new atrial fibrillation in the clinical indication. FINDINGS: PULMONARY ARTERIES: Pulmonary arteries are adequately opacified for evaluation. No pulmonary embolism. Main pulmonary artery is normal in caliber. MEDIASTINUM: The heart demonstrates coronary artery atherosclerotic calcification. The pericardium demonstrates no acute abnormality. There is aortic atherosclerotic calcification. LYMPH NODES: No mediastinal, hilar or axillary lymphadenopathy. LUNGS AND PLEURA: Diffuse bronchial wall thickening. Lower lung atelectasis and scarring.  Bilateral pleural thickening and calcified pleural plaques greatest in the posterior lower lobes. No focal consolidation or pulmonary edema. No pleural effusion or pneumothorax. UPPER ABDOMEN: Pneumobilia. Cholecystectomy. Small hiatal hernia. SOFT TISSUES AND BONES: No acute bone or soft tissue abnormality. IMPRESSION: 1. No pulmonary embolism. 2. Diffuse bronchial wall thickening compatible with airway infection or inflammation. 3. 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 Electronically signed by: Norman Gatlin MD 09/08/2024 09:05 PM EST RP Workstation: [id number]   DG Chest Port 1 View Result Date: 09/08/2024 EXAM: 1 VIEW(S) XRAY OF THE CHEST 09/08/2024 05:18:00 PM COMPARISON: None available. CLINICAL HISTORY: sob, new afib FINDINGS: LUNGS AND PLEURA: Low lung volumes. Right lower lung zone calcified granuloma. Left chest calcified pleural plaques have progressed from prior. No pulmonary edema. No pleural effusion. No pneumothorax. HEART AND MEDIASTINUM: Atherosclerotic plaque. No acute abnormality of the cardiac and mediastinal silhouettes. BONES AND SOFT TISSUES: No acute osseous abnormality. IMPRESSION: 1. No acute cardiopulmonary process identified. 2. Left chest calcified pleural plaques have progressed from prior. Electronically signed by: Norman Gatlin MD 09/08/2024 05:33 PM EST RP Workstation: [id number]    Dialysis Orders:  GOC  TTS 3:30 BFR 350 EDW 54 kg 2K/2.5Ca  AVF  -No heparin   -Mircera 60 q wks (last 10/30)  -Calcitriol 0.25 q HD  Post HD wt 11/6 53.2 kg    Assessment/Plan: AFIb with RVR. New onset. Cardiology consulted. S/p IV meds. Now on Eliquis, amiodarone. Per primary   ESRD.  HD TTS. Continue on schedule. Next HD Sat.   Hypotension. Noted on arrival. On midodrine here. Improved Volume. Appears euvolemic. Minimal  UF with HD  Anemia. Hgb 9.7. ESA recently dosed. Follow trends.  Metabolic bone disease.  CorrCa/Phos acceptable. Resume calcitriol. Follow  trends.  Nutrition. Low albumin. Add prot supp  CAD. Medical management per cards. Imdur  and metoprolol  on hold for hypotension   Maisie Ronnald Acosta PA-C [facility] 09/09/2024, 3:22 PM    Seen and examined independently.  Agree with note and exam as documented above by physician extender and as noted here.  Mr. Jarboe is a pleasant gentleman with a history of ESRD on HD TTS at [facility] who presented to the ER with dizziness and hypotension after HD and was found to be in Afib with RVR. Cardiology was consulted and he was initiated on amio and heparin  and later transitioned to amio po and eliquis. Course has been complicated by hypotension and he is now on midodrine here.  Nephrology is consulted for assistance with management of ESRD.   General elderly male in bed in no acute distress HEENT normocephalic atraumatic extraocular movements intact sclera anicteric Neck supple trachea midline Lungs clear to auscultation bilaterally normal work of breathing at rest  Heart Tachycardic; no rub Abdomen soft nontender nondistended Extremities no edema  Psych seems a bit anxious  Neuro awake and interactive but tired Access LUE AVF with bruit and thrill   # Afib with RVR, new onset  - per cardiology and primary team  - note TTE with recommendations with consideration for TEE to further evaluate the mitral valve (clinical correlation required)  # ESRD  - HD per TTS schedule  - his potassium has been controlled outpatient - lokelma is not on outpatient med list at HD unit - Would not resume the lokelma   # Hypotension  - note that he is on midodrine here  - usually on metoprolol  and imdur  at home which are on hold    # Anemia of CKD - Note recent ESA on 10/30   # Metabolic bone disease - resume calcitriol   Thank you for the consult.  Please do not hesitate to contact me with any questions regarding our patient    Katheryn JAYSON Saba, MD 09/09/2024  6:00 PM

## 2024-09-09 NOTE — Progress Notes (Signed)
 Consultation Progress Note   Patient: Pedro Young FMW:[id number] DOB: 10-07-1933 DOA: 09/08/2024 DOS: the patient was seen and examined on 09/09/2024 Primary service: Keyshun Elpers, Landon BRAVO, MD  Brief hospital course: Pedro Young is a 89 y.o. male with medical history significant of ESRD on T H sat, HLD, HTN, CAD, BPH  Presented with  palpitaions Came from hd hx of ESSRD on HD t H sat had dizziness palpitations no hx of A.fib. Found to have a.fib w RVR HR to 170 - now 140's Cardiology recommended  heparin  and bolus with Amiodarone  This morning, He is in NSR,  Cards recommend transition to PO amiodarone and Eliquis 2.5mg  BID   Assessment and Plan: New onset Afib Presented with Palpitations, HR in 140-170s Started on Amiodarone gtt and heparin  drip This AM he is in sinus rhythm, transition to PO amiodarone  and eliquis per Cards Palpitations improved  ESRD on dialysis (HCC) Plan for HD tomorrow    HYPERTENSION, BENIGN ESSENTIAL Soft bp, hold BP meds   CAD (coronary artery disease) Currently appears to be stable No chest pain When able would restart aspirin  If blood pressure allows would restart Imdur  Continue Crestor    Hyperlipidemia Continue on crestor  10 mg a day      TRH will continue to follow the patient.  Subjective: Daughters at bedside, patient reports no palpitation. Feels better this morning  Physical Exam: Vitals:   09/09/24 0845 09/09/24 0900 09/09/24 0915 09/09/24 0930  BP: (!) 100/50 (!) 104/49 (!) 99/48 (!) 103/46  Pulse: 87 88 89 85  Resp:    18  Temp:      TempSrc:      SpO2: 98% 100% 100% 100%  Weight:      Height:      General: Ill-appearing. Awake and alert and oriented x3. Not in any acute distress.  Neck: Neck supple without lymphadenopathy. No carotid bruits. No masses palpated.  Cardiovascular: Regular rate with normal S1-S2 sounds. No murmurs, rubs or gallops auscultated. No JVD.  Respiratory: Diminished  breath sounds.  No accessory  muscle use. Abdomen: Soft, nontender, nondistended. Active bowel sounds. No masses or hepatosplenomegaly  Musculoskeletal:  2+ dorsalis pedis and radial pulses. Good ROM.  No contractures  Psychiatric: Intact judgment and insight.  Mood appropriate to current condition. Neurologic: No focal neurological deficits. Strength is 5/5 x 4.  CN II - XII grossly intact  Data Reviewed:    CBC    Component Value Date/Time   WBC 10.1 09/09/2024 0311   RBC 2.98 (L) 09/09/2024 0311   HGB 9.7 (L) 09/09/2024 0311   HGB 11.8 (L) 07/20/2023 0000   HCT 29.7 (L) 09/09/2024 0311   HCT 36.2 (L) 07/20/2023 0000   PLT 146 (L) 09/09/2024 0311   PLT 156 07/20/2023 0000   MCV 99.7 09/09/2024 0311   MCV 98 (H) 07/20/2023 0000   MCH 32.6 09/09/2024 0311   MCHC 32.7 09/09/2024 0311   RDW 15.1 09/09/2024 0311   RDW 13.0 07/20/2023 0000   LYMPHSABS 1.0 09/08/2024 1603   MONOABS 0.9 09/08/2024 1603   EOSABS 0.1 09/08/2024 1603   BASOSABS 0.0 09/08/2024 1603    CMP     Component Value Date/Time   NA 132 (L) 09/09/2024 0311   NA 139 07/20/2023 0000   K 4.2 09/09/2024 0311   CL 92 (L) 09/09/2024 0311   CO2 25 09/09/2024 0311   GLUCOSE 95 09/09/2024 0311   BUN 25 (H) 09/09/2024 0311   BUN 95 (HH) 07/20/2023  0000   CREATININE 4.55 (H) 09/09/2024 0311   CALCIUM  7.3 (L) 09/09/2024 0311   CALCIUM  QNSREP 04/07/2023 0454   PROT 5.6 (L) 09/09/2024 0311   PROT 6.4 04/07/2024 0811   ALBUMIN 2.1 (L) 09/09/2024 0311   ALBUMIN 3.7 04/07/2024 0811   AST 49 (H) 09/09/2024 0311   ALT 37 09/09/2024 0311   ALKPHOS 171 (H) 09/09/2024 0311   BILITOT 1.4 (H) 09/09/2024 0311   BILITOT 0.3 04/07/2024 0811   GFR 19.48 (L) 09/13/2019 1222   EGFR 9 (L) 07/20/2023 0000   GFRNONAA 12 (L) 09/09/2024 0311   Medication Comments documented by Emmitt Czech' R, CPhT on 09/08/2024 at 2004. Dialysis Tue, Thurs, Sat  Family Communication: Family at bedside  Time spent: 36 minutes.  Author: Landon FORBES Baller,  MD 09/09/2024 11:14 AM  For on call review [email or web address].

## 2024-09-09 NOTE — Assessment & Plan Note (Signed)
 Postdialysis. Patient received IV fluids in the ER. Also could try midodrine If needed could attempt small IV fluid boluses At this point patient is mentating well Nephrology is aware patient has been admitted

## 2024-09-09 NOTE — Progress Notes (Signed)
  Echocardiogram 2D Echocardiogram has been performed.  Koleen KANDICE Popper, RDCS 09/09/2024, 8:30 AM

## 2024-09-09 NOTE — Evaluation (Signed)
 Occupational Therapy Evaluation Patient Details Name: Pedro Young MRN: [id number] DOB: 1933/07/14 Today's Date: 09/09/2024   History of Present Illness   89 yo male presents to ED 09/08/24 from HD with dizziness and hypotension. Pt  in Afib with RVR upon arrival. PMHx: CAD, BPH, HLD, HTN, preDM, ESRD on HD TTS     Clinical Impressions Pt walked with a cane shorter distances and used a transport chair for longer. He reports independence in basic ADLs, standing to shower, and reliance on his daughter, with whom he lives, for IADLs. Pt presents with generalized weakness, decreased activity tolerance, pain all over, and poor standing balance. He is a poor historian and unaware why he was brought to the hospital. He needs max to mod assist for bed mobility and moderate assistance with RW to stand with pt stabilizing LEs on bed. Pt with bowel incontinence, unaware. He needs set up to total assist for ADLs. Pt declined attempt to ambulate or transfer to chair this visit. HR to 115, SpO2 100% on 1L O2. Patient will benefit from continued inpatient follow up therapy, <3 hours/day.     If plan is discharge home, recommend the following:   A lot of help with walking and/or transfers;A lot of help with bathing/dressing/bathroom;Assistance with cooking/housework;Direct supervision/assist for medications management;Direct supervision/assist for financial management;Assist for transportation;Help with stairs or ramp for entrance     Functional Status Assessment   Patient has had a recent decline in their functional status and demonstrates the ability to make significant improvements in function in a reasonable and predictable amount of time.     Equipment Recommendations   BSC/3in1     Recommendations for Other Services         Precautions/Restrictions   Precautions Precautions: Fall Recall of Precautions/Restrictions: Impaired     Mobility Bed Mobility Overal bed mobility:  Needs Assistance Bed Mobility: Supine to Sit, Sit to Supine     Supine to sit: Max assist Sit to supine: Mod assist   General bed mobility comments: assist for LEs over EOB and to raise trunk and for LEs back into bed    Transfers Overall transfer level: Needs assistance Equipment used: Rolling walker (2 wheels) Transfers: Sit to/from Stand Sit to Stand: Mod assist                  Balance Overall balance assessment: Needs assistance Sitting-balance support: Feet supported, No upper extremity supported Sitting balance-Leahy Scale: Fair     Standing balance support: Bilateral upper extremity supported Standing balance-Leahy Scale: Poor                             ADL either performed or assessed with clinical judgement   ADL Overall ADL's : Needs assistance/impaired Eating/Feeding: Set up;Bed level   Grooming: Wash/dry hands;Wash/dry face;Sitting;Supervision/safety   Upper Body Bathing: Moderate assistance;Sitting   Lower Body Bathing: Total assistance;Sit to/from stand   Upper Body Dressing : Moderate assistance;Sitting   Lower Body Dressing: Total assistance;Sit to/from stand       Toileting- Architect and Hygiene: Total assistance;Sit to/from stand               Vision Ability to See in Adequate Light: 0 Adequate Patient Visual Report: No change from baseline       Perception         Praxis         Pertinent Vitals/Pain Pain Assessment Pain Assessment: Faces Faces  Pain Scale: Hurts even more Pain Location: all over Pain Descriptors / Indicators: Grimacing, Guarding Pain Intervention(s): Monitored during session, Repositioned     Extremity/Trunk Assessment Upper Extremity Assessment Upper Extremity Assessment: Generalized weakness;RUE deficits/detail   Lower Extremity Assessment Lower Extremity Assessment: Defer to PT evaluation   Cervical / Trunk Assessment Cervical / Trunk Assessment: Kyphotic;Other  exceptions (weakness)   Communication Communication Communication: Impaired Factors Affecting Communication: Reduced clarity of speech   Cognition Arousal: Alert Behavior During Therapy: Flat affect Cognition: No family/caregiver present to determine baseline             OT - Cognition Comments: slow processing, poor historian                 Following commands: Impaired Following commands impaired: Only follows one step commands consistently     Cueing  General Comments   Cueing Techniques: Verbal cues;Visual cues      Exercises     Shoulder Instructions      Home Living Family/patient expects to be discharged to:: Private residence Living Arrangements: Children Available Help at Discharge: Family;Available 24 hours/day Type of Home: House Home Access: Stairs to enter [employer] of Steps: a few Entrance Stairs-Rails:  (yes) Home Layout: Two level;1/2 bath on main level Alternate Level Stairs-Number of Steps: flight Alternate Level Stairs-Rails:  ('Yes) Bathroom Shower/Tub: Walk-in Soil Scientist Toilet: Handicapped height     Home Equipment: Agricultural Consultant (2 wheels);Cane - single point;Shower seat;Hand held Environmental Education Officer Comments: reports his bedroom is upstairs      Prior Functioning/Environment Prior Level of Function : Needs assist             Mobility Comments: walks with a cane, transport chair for longer distances ADLs Comments: sometimes sits to shower, assisted for med management, all IADLs    OT Problem List: Decreased strength;Decreased activity tolerance;Impaired balance (sitting and/or standing);Decreased cognition;Decreased safety awareness;Pain   OT Treatment/Interventions: Self-care/ADL training;DME and/or AE instruction;Therapeutic activities;Patient/family education;Balance training;Cognitive remediation/compensation      OT Goals(Current goals can be found in the care plan  section)   Acute Rehab OT Goals OT Goal Formulation: With patient Time For Goal Achievement: 09/23/24 Potential to Achieve Goals: Good ADL Goals Pt Will Perform Grooming: with min assist;standing Pt Will Perform Upper Body Bathing: with supervision;sitting Pt Will Perform Upper Body Dressing: with supervision;sitting Pt Will Transfer to Toilet: with min assist;ambulating;bedside commode Pt Will Perform Toileting - Clothing Manipulation and hygiene: with mod assist;sit to/from stand Additional ADL Goal #1: Pt will complete bed mobility with min assist in preparation for ADLs.   OT Frequency:  Min 2X/week    Co-evaluation              AM-PAC OT 6 Clicks Daily Activity     Outcome Measure Help from another person eating meals?: A Little Help from another person taking care of personal grooming?: A Little Help from another person toileting, which includes using toliet, bedpan, or urinal?: Total Help from another person bathing (including washing, rinsing, drying)?: A Lot Help from another person to put on and taking off regular upper body clothing?: A Little Help from another person to put on and taking off regular lower body clothing?: Total 6 Click Score: 13   End of Session Equipment Utilized During Treatment: Gait belt;Rolling walker (2 wheels);Oxygen  Activity Tolerance: Patient limited by fatigue Patient left: in bed;with call bell/phone within reach;with bed alarm set  OT Visit Diagnosis: Unsteadiness on  feet (R26.81);Other abnormalities of gait and mobility (R26.89);Pain;Muscle weakness (generalized) (M62.81);Other symptoms and signs involving cognitive function                Time: 1447-1519 OT Time Calculation (min): 32 min Charges:  OT General Charges $OT Visit: 1 Visit OT Evaluation $OT Eval Moderate Complexity: 1 Mod OT Treatments $Self Care/Home Management : 8-22 mins Mliss HERO, OTR/L Acute Rehabilitation Services Office: [phone]   Kennth Mliss Helling 09/09/2024, 3:51 PM

## 2024-09-09 NOTE — Progress Notes (Addendum)
 HOSPITAL MEDICINE OVERNIGHT EVENT NOTE    Notified by nursing that patient experienced a fever this evening of 102.9 F associated with significant tachycardia in the 130s.  Chart reviewed, patient is currently not being treated for an infectious process.  Imaging of the chest performed yesterday did reveal some bronchial thickening but no definitive pneumonia.  Obtaining blood cultures,  lactic acid, chest x-ray.  Will also obtain COVID-19 and influenza testing.    Upon review of patient's labs patient was found to have magnesium  1.7 today and therefore will additionally administer 2 g of magnesium  sulfate.  Will hold off on initiation of antibiotics for now until further evidence of a potential source can be identified.Pedro Young Kenard  MD [facility]

## 2024-09-09 NOTE — Assessment & Plan Note (Signed)
 Obtain anemia panel  Transfuse for Hg <7 , rapidly dropping or  if symptomatic

## 2024-09-09 NOTE — Discharge Instructions (Signed)
Information on my medicine - ELIQUIS (apixaban)  This medication education was reviewed with me or my healthcare representative as part of my discharge preparation.  The pharmacist that spoke with me during my hospital stay was:  Einar Grad, [facility]  Why was Eliquis prescribed for you? Eliquis was prescribed for you to reduce the risk of a blood clot forming that can cause a stroke if you have a medical condition called atrial fibrillation (a type of irregular heartbeat).  What do You need to know about Eliquis ? Take your Eliquis TWICE DAILY - one tablet in the morning and one tablet in the evening with or without food. If you have difficulty swallowing the tablet whole please discuss with your pharmacist how to take the medication safely.  Take Eliquis exactly as prescribed by your doctor and DO NOT stop taking Eliquis without talking to the doctor who prescribed the medication.  Stopping may increase your risk of developing a stroke.  Refill your prescription before you run out.  After discharge, you should have regular check-up appointments with your healthcare provider that is prescribing your Eliquis.  In the future your dose may need to be changed if your kidney function or weight changes by a significant amount or as you get older.  What do you do if you miss a dose? If you miss a dose, take it as soon as you remember on the same day and resume taking twice daily.  Do not take more than one dose of ELIQUIS at the same time to make up a missed dose.  Important Safety Information A possible side effect of Eliquis is bleeding. You should call your healthcare provider right away if you experience any of the following: Bleeding from an injury or your nose that does not stop. Unusual colored urine (red or dark brown) or unusual colored stools (red or black). Unusual bruising for unknown reasons. A serious fall or if you hit your head (even if there is no bleeding).  Some  medicines may interact with Eliquis and might increase your risk of bleeding or clotting while on Eliquis. To help avoid this, consult your healthcare provider or pharmacist prior to using any new prescription or non-prescription medications, including herbals, vitamins, non-steroidal anti-inflammatory drugs (NSAIDs) and supplements.  This website has more information on Eliquis (apixaban): http://www.eliquis.com/eliquis/home

## 2024-09-09 NOTE — Progress Notes (Signed)
 PT Cancellation Note  Patient Details Name: Pedro Young MRN: [id number] DOB: Jan 03, 1933   Cancelled Treatment:    Reason Eval/Treat Not Completed: Other (comment) (BP 99/44 (60) and actively transferring rooms after therapist introduced self)   [name] [name] 09/09/2024, 12:11 PM Lenoard SQUIBB, PT Acute Rehabilitation Services Office: [phone]

## 2024-09-09 NOTE — Telephone Encounter (Signed)
 Patient Product/process development scientist completed.    The patient is insured through [employer]. Patient has Medicare and is not eligible for a copay card, but may be able to apply for patient assistance or Medicare RX Payment Plan (Patient Must reach out to their plan, if eligible for payment plan), if available.    Ran test claim for Eliquis 5 mg and the current 30 day co-pay is $0.00.  Ran test claim for Xarelto 20 mg and the current 30 day co-pay is $0.00.  This test claim was processed through [facility]- copay amounts may vary at other pharmacies due to pharmacy/plan contracts, or as the patient moves through the different stages of their insurance plan.     Pedro Young, CPHT Pharmacy Technician Patient Advocate Specialist Lead [facility] Patient Advocate Team Direct Number: [phone]  Fax: [phone]

## 2024-09-09 NOTE — Progress Notes (Addendum)
 Progress Note  Patient Name: Pedro Young Date of Encounter: 09/09/2024 [facility] Cardiologist: [name], MD   Interval Summary   Defer to MD  Vital Signs Vitals:   09/09/24 0355 09/09/24 0500 09/09/24 0530 09/09/24 0600  BP:  (!) 112/47 (!) 109/54 (!) 117/49  Pulse:  93 93 93  Resp:  (!) 29 (!) 30 (!) 26  Temp: 100.3 F (37.9 C)     TempSrc: Oral     SpO2:  99% 96% 100%  Weight:      Height:       No intake or output data in the 24 hours ending 09/09/24 0819    09/08/2024    4:00 PM 05/09/2024    8:30 AM 03/25/2024    9:47 AM  Last 3 Weights  Weight (lbs) 150 lb 124 lb 4.8 oz 122 lb 6.4 oz  Weight (kg) 68.04 kg 56.382 kg 55.52 kg      Telemetry/ECG  Sinus rhythm HR 90 - Personally Reviewed  Physical Exam  Defer to MD  Assessment & Plan  Pedro Young is a 89 y.o. male with a hx of CAD, hypertension, BPH, ESRD on HD Tues,Thurs, & Sat, and hyperlipidemia who presented to the ED on 11/6 from dialysis for dizziness and hypotension. EMS on the scene found patient to be AF RVR Hr 150 with BP 82/49. He was started on Iv amiodarone and cardiology was consulted.   Atrial Fibrillation with RVR Patient is currently in sinus rhythm  Chad Vasc score 4 Echo pending  Convert IV heparin  to eliquis 2.5 mg BID Convert IV amiodarone to oral amiodarone 200 mg BID, will continue frequency and dosing until outpatient appointment   CAD Defer antiplatelet for chronic anticoagulation as above  Imdur  and metoprolol  on hold 2/2 hypotension  Hyperlipidemia 04/2024 LDL 54   HDL 54 [lab values while on crestor  40mg  which was reduced this admission for renal dysfunction] Continue crestor  10 mg, reduced for renal dysfunction. Allergy to other statins.  Hx of hypertension Hypotension  BP: 103/46 Continue midodrine  Per primary and nephrology  Per primary  BPH ESRD on HD Electrolyte disturbances Hypothyroidism Anemia  Patient has outpatient follow up with  Dr. Decent for 11/17.  Patient will need follow up LFTS and lipid panel 2/2 crestor  reduction and amiodarone initiation.    For questions or updates, please contact [facility] Please consult [email or web address] for contact info under       Signed, Leontine LOISE Salen, PA-C   Patient seen and examined, note reviewed with the signed Advanced Practice Provider. I personally reviewed laboratory data, imaging studies and relevant notes. I independently examined the patient and formulated the important aspects of the plan. I have personally discussed the plan with the patient and/or family. Comments or changes to the note/plan are indicated below.   Patient seen and examined at his bedside. Our visit was facilitated by interpreter [facility] ( # [phone]). He reports doing well. He offers no complaints his daughter is at the bedside.   GEN:  Well nourished, well developed in no acute distress HEENT: Mucous membranes moist, good dentition NECK: No JVD; No carotid bruits CARDIAC: S1S2 noted, RRR, no murmurs, rubs, gallops RESPIRATORY:  Clear to auscultation without rales, wheezing or rhonchi  ABDOMEN: Soft, non-tender, non-distended, bowel sounds noted, no guarding EXTREMITIES:No cyanosis, no cyanosis, no clubbing SKIN: Warm and dry NEUROLOGIC:  Alert and oriented x 3, nonfocal   Paroxysmal atrial fibrillation  Hypotension - on midodrine CAD  Hyperlipidemia  ESRD on HD Hypothyroidism  He is in sinus rhythm on the amio gtt. I think he will benefit from oral amiodarone will transition to 200 mg BID  that will be titrated down in the outpatient setting.  He is a candidate for anticoagulation - transition to Eliquis 2.5 mg BID.   In the setting of medication changes we will adjust his crestor , dose decrease 10 mg daily unfortunately crestor  is the only statin he has been able to tolerate so we have no choice here.    CAD - no anginal symptoms.    Joany Khatib DO, MS [facility] Attending  Cardiologist [facility]  [address] [place], [place] [address] [phone] Website: [email or web address]

## 2024-09-09 NOTE — Progress Notes (Signed)
 Initial Nutrition Assessment  DOCUMENTATION CODES:   Severe malnutrition in context of chronic illness  INTERVENTION:  Cotninue Renal diet, 1200 mL Fluid Nepro Shake po BID, each supplement provides 425 kcal and 19 grams protein Add Renal MVI daily Continue Renvela daily    NUTRITION DIAGNOSIS:   Severe Malnutrition related to chronic illness as evidenced by energy intake < 75% for > or equal to 1 month, severe muscle depletion, severe fat depletion.   GOAL:   Patient will meet greater than or equal to 90% of their needs   MONITOR:   PO intake, Supplement acceptance  REASON FOR ASSESSMENT:   Consult Assessment of nutrition requirement/status  ASSESSMENT:   PMH ESRD on HD TTS, HTN, cancer, anemia, CAD, HLD, BPH, HTN, depression, gout who presents to the emergency department for dizziness.  Patient seen in room, daughter at bedside. Pt live with daughter full time, she cooks/provides meals for him. She reports he has had a decreased appetite for the past few weeks, has been eating less of the meals she makes. She tries to provide  a protein, starch and vegetable for each meal unless they go out to eat for a meal. Lunch tray in room, < 25% consumed. RD brought patient mixed berry Nepro to try, patient was able to consume a few sips and liked the flavor. Daughter is unsure of what patient's usual dry weight does think he looks like he has lost significant weight recently. Plan for HD tomorrow, pt is on TTHSa schedule outpatient.   Admit/Current weight: 53.1 kg  Nutritionally Relevant Medications: Renvela  Labs Reviewed: Na 132, Cl 92, BUN 25, Cr 4.55, Calcium  7.3, GFR 12  NUTRITION - FOCUSED PHYSICAL EXAM:  Flowsheet Row Most Recent Value  Orbital Region Severe depletion  Upper Arm Region Severe depletion  Thoracic and Lumbar Region Moderate depletion  Buccal Region Moderate depletion  Temple Region Severe depletion  Clavicle Bone Region Severe depletion  Clavicle and  Acromion Bone Region Severe depletion  Scapular Bone Region Severe depletion  Dorsal Hand Severe depletion  Patellar Region Severe depletion  Anterior Thigh Region Severe depletion  Posterior Calf Region Severe depletion  Hair Reviewed  Eyes Reviewed  Mouth Reviewed  Skin Reviewed  Nails Reviewed    Diet Order:   Diet Order             Diet renal with fluid restriction Fluid restriction: 1200 mL Fluid; Room service appropriate? Yes; Fluid consistency: Thin  Diet effective now                   EDUCATION NEEDS:   Education needs have been addressed  Skin:  Skin Assessment: Reviewed RN Assessment  Last BM:  PTA  Height:   Ht Readings from Last 1 Encounters:  09/09/24 5' 6 (1.676 m)    Weight:   Wt Readings from Last 1 Encounters:  09/09/24 53.1 kg    BMI:  Body mass index is 18.89 kg/m.  Estimated Nutritional Needs:   Kcal:  1600-1850  Protein:  65-80 grams  Fluid:  1L + UOP  Pedro Guzzetta, MS, RD, LDN Clinical Dietitian  Contact via secure chat. If unavailable, use group chat RD Inpatient.

## 2024-09-09 NOTE — ED Notes (Signed)
 Chief Complaint  Patient presents with   Dizziness   Hypotension   Past Medical History:  Diagnosis Date   Cancer (HCC)    ESRD on hemodialysis (HCC)    HD at [facility] on TTS schedule, started June 2024   Hypertension     BP (!) 116/52   Pulse 94   Temp 100.3 F (37.9 C) (Oral)   Resp (!) 23   Ht 5' 6 (1.676 m)   Wt 68 kg   SpO2 100%   BMI 24.21 kg/m   Pt MD unable to give critical troponin level to   Trop  3508

## 2024-09-09 NOTE — Assessment & Plan Note (Signed)
 Check TSH

## 2024-09-10 DIAGNOSIS — I480 Paroxysmal atrial fibrillation: Secondary | ICD-10-CM | POA: Diagnosis not present

## 2024-09-10 LAB — RENAL FUNCTION PANEL
Albumin: 1.8 g/dL — ABNORMAL LOW (ref 3.5–5.0)
Anion gap: 15 (ref 5–15)
BUN: 54 mg/dL — ABNORMAL HIGH (ref 8–23)
CO2: 25 mmol/L (ref 22–32)
Calcium: 7.8 mg/dL — ABNORMAL LOW (ref 8.9–10.3)
Chloride: 95 mmol/L — ABNORMAL LOW (ref 98–111)
Creatinine, Ser: 7.43 mg/dL — ABNORMAL HIGH (ref 0.61–1.24)
GFR, Estimated: 6 mL/min — ABNORMAL LOW (ref >60)
Glucose, Bld: 94 mg/dL (ref 70–99)
Phosphorus: 4.1 mg/dL (ref 2.5–4.6)
Potassium: 4.2 mmol/L (ref 3.5–5.1)
Sodium: 135 mmol/L (ref 135–145)

## 2024-09-10 LAB — CBC
HCT: 24.3 % — ABNORMAL LOW (ref 39.0–52.0)
Hemoglobin: 8.4 g/dL — ABNORMAL LOW (ref 13.0–17.0)
MCH: 32.2 pg (ref 26.0–34.0)
MCHC: 34.6 g/dL (ref 30.0–36.0)
MCV: 93.1 fL (ref 80.0–100.0)
Platelets: 131 K/uL — ABNORMAL LOW (ref 150–400)
RBC: 2.61 MIL/uL — ABNORMAL LOW (ref 4.22–5.81)
RDW: 15.7 % — ABNORMAL HIGH (ref 11.5–15.5)
WBC: 12 K/uL — ABNORMAL HIGH (ref 4.0–10.5)
nRBC: 0 % (ref 0.0–0.2)

## 2024-09-10 LAB — MRSA NEXT GEN BY PCR, NASAL: MRSA by PCR Next Gen: NOT DETECTED

## 2024-09-10 LAB — RESP PANEL BY RT-PCR (RSV, FLU A&B, COVID)  RVPGX2
Influenza A by PCR: NEGATIVE
Influenza B by PCR: NEGATIVE
Resp Syncytial Virus by PCR: NEGATIVE
SARS Coronavirus 2 by RT PCR: NEGATIVE

## 2024-09-10 LAB — LACTIC ACID, PLASMA: Lactic Acid, Venous: 0.9 mmol/L (ref 0.5–1.9)

## 2024-09-10 LAB — PROCALCITONIN: Procalcitonin: 39.5 ng/mL

## 2024-09-10 MED ORDER — METOPROLOL TARTRATE 5 MG/5ML IV SOLN
2.5000 mg | INTRAVENOUS | Status: DC | PRN
  Administered 2024-09-10: 2.5 mg via INTRAVENOUS

## 2024-09-10 NOTE — Evaluation (Signed)
 Physical Therapy Evaluation Patient Details Name: Pedro Young MRN: [id number] DOB: 12-24-1932 Today's Date: 09/10/2024  History of Present Illness  89 yo male presents to ED 09/08/24 from HD with dizziness and hypotension. Pt  in Afib with RVR upon arrival. PMHx: CAD, BPH, HLD, HTN, preDM, ESRD on HD TTS  Clinical Impression  Pt admitted with above diagnosis. Pt too lethargic and fatigued to come to eOB even though attempted. Pt was able to complete some UE and LE exercises.  Educated daughter in cuing pt to perform UE and LE exercises over next few days when he is alert and daughter also to ask nursing to get pt OOB tomorrow if he is more alert.  Will follow acutely and progress pt as able. Recommend post acute rehab < 3 hours day for pt to reach prior level of function and daughter in agreement.  Pt currently with functional limitations due to the deficits listed below (see PT Problem List). Pt will benefit from acute skilled PT to increase their independence and safety with mobility to allow discharge.           If plan is discharge home, recommend the following: A little help with walking and/or transfers;A little help with bathing/dressing/bathroom;Assistance with cooking/housework;Assist for transportation;Help with stairs or ramp for entrance   Can travel by private vehicle   No    Equipment Recommendations None recommended by PT  Recommendations for Other Services       Functional Status Assessment Patient has had a recent decline in their functional status and demonstrates the ability to make significant improvements in function in a reasonable and predictable amount of time.     Precautions / Restrictions Precautions Precautions: Fall Recall of Precautions/Restrictions: Impaired Restrictions Weight Bearing Restrictions Per Provider Order: No      Mobility  Bed Mobility Overal bed mobility: Needs Assistance Bed Mobility: Rolling Rolling: Mod assist   Supine to sit:  Total assist     General bed mobility comments: Needs incr assist for bed mobility and ultimately refused to come to sitting once attempted.  Daughter stated pt said,I just cant.    Transfers                        Ambulation/Gait                  Stairs            Wheelchair Mobility     Tilt Bed    Modified Rankin (Stroke Patients Only)       Balance                                             Pertinent Vitals/Pain Pain Assessment Pain Assessment: Faces Faces Pain Scale: Hurts even more Pain Location: all over Pain Descriptors / Indicators: Grimacing, Guarding Pain Intervention(s): Limited activity within patient's tolerance, Monitored during session, Repositioned    Home Living Family/patient expects to be discharged to:: Private residence Living Arrangements: Children (daughter) Available Help at Discharge: Family;Available 24 hours/day Type of Home: House Home Access: Stairs to enter Entrance Stairs-Rails: Right Entrance Stairs-Number of Steps: 3   Home Layout: One level Home Equipment: Agricultural Consultant (2 wheels);Cane - single point;Shower seat;Hand held shower head;Transport chair Additional Comments: Daughter present 11/8 and confirmed above home info    Prior Function Prior Level of Function :  Needs assist             Mobility Comments: walks with a cane, transport chair for longer distances ADLs Comments: sometimes sits to shower, assisted for med management, all IADLs     Extremity/Trunk Assessment   Upper Extremity Assessment Upper Extremity Assessment: Defer to OT evaluation    Lower Extremity Assessment Lower Extremity Assessment: Generalized weakness    Cervical / Trunk Assessment Cervical / Trunk Assessment: Kyphotic;Other exceptions (weakness)  Communication   Communication Communication: Impaired Factors Affecting Communication: Reduced clarity of speech    Cognition Arousal:  Alert Behavior During Therapy: Flat affect                             Following commands: Impaired Following commands impaired: Only follows one step commands consistently     Cueing Cueing Techniques: Verbal cues, Visual cues     General Comments      Exercises General Exercises - Upper Extremity Shoulder Flexion: AAROM, Both, 5 reps, Supine Shoulder Extension: AAROM, Both, 5 reps, Supine Elbow Flexion: AAROM, Both, 5 reps, Supine Elbow Extension: AAROM, Both, 5 reps, Supine General Exercises - Lower Extremity Ankle Circles/Pumps: AROM, Both, 5 reps, Supine Quad Sets: AROM, Both, 5 reps, Supine Gluteal Sets: AROM, Both, 5 reps, Supine Heel Slides: AAROM, Both, 5 reps, Supine   Assessment/Plan    PT Assessment Patient needs continued PT services  PT Problem List Decreased activity tolerance;Decreased balance;Decreased mobility;Decreased knowledge of use of DME;Decreased safety awareness;Decreased knowledge of precautions;Cardiopulmonary status limiting activity       PT Treatment Interventions DME instruction;Gait training;Functional mobility training;Therapeutic activities;Therapeutic exercise;Balance training;Patient/family education;Stair training    PT Goals (Current goals can be found in the Care Plan section)  Acute Rehab PT Goals Patient Stated Goal: to go home PT Goal Formulation: With patient Time For Goal Achievement: 09/24/24 Potential to Achieve Goals: Good    Frequency Min 2X/week     Co-evaluation               AM-PAC PT 6 Clicks Mobility  Outcome Measure Help needed turning from your back to your side while in a flat bed without using bedrails?: A Lot Help needed moving from lying on your back to sitting on the side of a flat bed without using bedrails?: A Lot Help needed moving to and from a bed to a chair (including a wheelchair)?: Total Help needed standing up from a chair using your arms (e.g., wheelchair or bedside chair)?:  Total Help needed to walk in hospital room?: Total Help needed climbing 3-5 steps with a railing? : Total 6 Click Score: 8    End of Session Equipment Utilized During Treatment: Oxygen Activity Tolerance: Patient limited by fatigue Patient left: in bed;with call bell/phone within reach;with bed alarm set;with family/visitor present Nurse Communication: Mobility status PT Visit Diagnosis: Muscle weakness (generalized) (M62.81)    Time: 8578-8565 PT Time Calculation (min) (ACUTE ONLY): 13 min   Charges:   PT Evaluation $PT Eval Moderate Complexity: 1 Mod   PT General Charges $$ ACUTE PT VISIT: 1 Visit         Coltyn Hanning M,PT Acute Rehab Services [phone]   Stephane JULIANNA Bevel 09/10/2024, 3:15 PM

## 2024-09-10 NOTE — Progress Notes (Signed)
   09/10/24 1139  Vitals  Temp 97.6 F (36.4 C)  Pulse Rate (!) 154  Resp 18  BP 107/76  SpO2 100 %  O2 Device Nasal Cannula  Weight 56.2 kg  Type of Weight Post-Dialysis  Oxygen Therapy  O2 Flow Rate (L/min) 2 L/min  Post Treatment  Dialyzer Clearance Lightly streaked  Liters Processed 71.9  Fluid Removed (mL) 0 mL  Tolerated HD Treatment Yes   Received patient in bed to unit.  Alert and oriented.  Informed consent signed and in chart.   TX duration: 3  Patient tolerated well.  Transported back to the room  Alert, without acute distress.  Hand-off given to patient's nurse.   Access used: Yes Access issues: No  Total UF removed: 0 Medication(s) given: Given some scheduled Medication, Lopressor  2.5 mg given per. MD. Geralynn for increase Heart Rate.. Bedside RN is aware of results and pt. Is back in A-fib now Post HD VS: See Above Grid Post HD weight: 56.2 kg   [facility]

## 2024-09-10 NOTE — Progress Notes (Signed)
 PT Cancellation Note  Patient Details Name: Ladislav Caselli MRN: [id number] DOB: 1933/09/29   Cancelled Treatment:    Reason Eval/Treat Not Completed: Patient at procedure or test/unavailable (Pt in HD.  Will check back as able.)   Stephane JULIANNA Bevel 09/10/2024, 8:40 AM Letrice Pollok M,PT Acute Rehab Services [phone]

## 2024-09-10 NOTE — Progress Notes (Signed)
 [facility] Progress Note   Subjective:  Febrile overnight. workup in progress.  Seen in [facility]. On dialysis. No UF ordered  Appears back in Afib.  Having rigors. No fever this am. Not really voicing any complaints other than being cold. Denies cp, sob.   Objective Vitals:   09/10/24 0814 09/10/24 0822 09/10/24 0830 09/10/24 0900  BP: (!) 105/52 (!) 106/52 (!) 98/52 (!) 113/56  Pulse: 91 89 85 90  Resp: (!) 30 (!) 28 (!) 25 (!) 30  Temp: 97.7 F (36.5 C) 97.7 F (36.5 C)    TempSrc:      SpO2: 100% 100% 100% 100%  Weight: 57.8 kg 57.8 kg    Height:        Additional Objective Labs: Basic Metabolic Panel: Recent Labs  Lab 09/08/24 1603 09/08/24 1624 09/08/24 1625 09/09/24 0311  NA 138 137 137 132*  K 3.1* 3.1* 3.1* 4.2  CL 93* 94*  --  92*  CO2 26  --   --  25  GLUCOSE 88 85  --  95  BUN 15 16  --  25*  CREATININE 3.38* 3.10*  --  4.55*  CALCIUM  8.2*  --   --  7.3*  PHOS  --   --   --  3.0   CBC: Recent Labs  Lab 09/08/24 1603 09/08/24 1624 09/08/24 1625 09/09/24 0311 09/10/24 0704  WBC 9.3  --   --  10.1 12.0*  NEUTROABS 7.3  --   --   --   --   HGB 11.2*   < > 10.9* 9.7* 8.4*  HCT 33.3*   < > 32.0* 29.7* 24.3*  MCV 97.7  --   --  99.7 93.1  PLT 149*  --   --  146* 131*   < > = values in this interval not displayed.   Blood Culture    Component Value Date/Time   SDES BLOOD RIGHT HAND 09/10/2024 0519   SPECREQUEST  09/10/2024 0519    BOTTLES DRAWN AEROBIC ONLY Blood Culture adequate volume   CULT  09/10/2024 0519    NO GROWTH < 12 HOURS Performed at [facility], [address] [place], [place] [address]    REPTSTATUS PENDING 09/10/2024 9480     Physical Exam General: Chronically ill appeairng, nad  Heart: Irregular  Lungs: Clear, normal wob Abdomen: nontender Extremities: no edema  Dialysis Access: L AVF   Medications:   amiodarone  200 mg Oral BID   apixaban  2.5 mg Oral BID   calcitRIOL  0.25 mcg Oral Q  T,Th,Sa-HD   Chlorhexidine  Gluconate Cloth  6 each Topical Q0600   feeding supplement (NEPRO CARB STEADY)  237 mL Oral BID BM   midodrine  5 mg Oral TID WC   multivitamin  1 tablet Oral QHS   rosuvastatin   10 mg Oral Daily   sevelamer carbonate  800 mg Oral TID WC   sodium chloride  flush  3 mL Intravenous Q12H   tamsulosin   0.4 mg Oral Daily    Dialysis Orders:  GOC TTS 3:30 BFR 350 EDW 54 kg 2K/2.5Ca  AVF  -No heparin   -Mircera 60 q wks (last 10/30)  -Calcitriol 0.25 q HD  Post HD wt 11/6 53.2 kg   Assessment/Plan: AFIb with RVR. New onset. Cardiology consulted. S/p IV meds. Now on Eliquis, amiodarone. Per primary team Fevers/leukocytosis. Per primary. Blood cultures pending.  ESRD.  HD TTS. Continue on schedule. Next HD Sat.   Hypotension.  Noted on arrival. On midodrine here. Improved Volume. Appears euvolemic. Minimal  UF with HD  Anemia. Hgb 8-9. Next ESA due 11/13.  Follow trends.  Metabolic bone disease.  CorrCa/Phos acceptable. Resume calcitriol. Follow trends.  Nutrition. Low albumin. Cont prot supp  CAD. Medical management per cards. Imdur  and metoprolol  on hold for hypotension     Maisie Ronnald Acosta PA-C [facility] 09/10/2024,9:35 AM

## 2024-09-10 NOTE — Plan of Care (Signed)
   Problem: Education: Goal: Knowledge of General Education information will improve Description Including pain rating scale, medication(s)/side effects and non-pharmacologic comfort measures Outcome: Progressing   Problem: Health Behavior/Discharge Planning: Goal: Ability to manage health-related needs will improve Outcome: Progressing

## 2024-09-10 NOTE — Plan of Care (Signed)

## 2024-09-10 NOTE — Progress Notes (Signed)
 Consultation Progress Note   Patient: Pedro Young DOB: 01/04/1933 DOA: 09/08/2024 DOS: the patient was seen and examined on 09/10/2024 Primary service: Pedro Young, Pedro BRAVO, MD  Brief hospital course: Pedro Young is a 89 y.o. male with medical history significant of ESRD on T H sat, HLD, HTN, CAD, BPH  Presented with  palpitaions Came from hd hx of ESSRD on HD t H sat had dizziness palpitations no hx of A.fib. Found to have a.fib w RVR HR to 170 - now 140's Cardiology recommended  heparin  and bolus with Amiodarone  This morning, He is in NSR,  Cards recommend transition to PO amiodarone and Eliquis 2.5mg  BID   Subjective Patient spiked a fever Tmax of 102.7 he was so tachycardic last night, blood cultures were obtained. Currently patient is a dialysis heart rate suboptimally controlled  Assessment and Plan:  SIRS Elevated WBC count-12, Tmax 102.7, tachycardic to the 120s, blood cultures obtained so far no growth.  Lactic acid was 0.9, chest x-ray showed no acute pulmonary findings No source identified yet. Follow-up procalcitonin  New onset Afib Presented with Palpitations, HR in 140-170s Started on Amiodarone gtt and heparin  drip 11/7-he is in sinus rhythm, transition to PO amiodarone  and eliquis per Cards Palpitations improved 11/8-poorly controlled A-fib dialysis  Hypomagnesemia - Replaced with 2 g of magnesium .  Recheck levels the morning  ESRD on dialysis (HCC) Tuesdays, Thursdays and Saturday schedule. Nephrology following    HYPERTENSION, BENIGN ESSENTIAL Soft bp, hold BP meds On midodrine   CAD (coronary artery disease) Currently appears to be stable No chest pain When able would restart aspirin  If blood pressure allows would restart Imdur  Continue Crestor    Hyperlipidemia Continue on crestor  10 mg a day      TRH will continue to follow the patient.   Physical Exam: Vitals:   09/10/24 1104 09/10/24 1131 09/10/24 1137 09/10/24 1139   BP: 101/75 (!) 98/50 92/65 107/76  Pulse: (!) 143  (!) 121 (!) 154  Resp: (!) 27 (!) 22 20 18   Temp:   97.6 F (36.4 C) 97.6 F (36.4 C)  TempSrc:      SpO2: 100%  96% 100%  Weight:    56.2 kg  Height:      General: Ill-appearing. Awake and alert and oriented x3. Not in any acute distress.  Neck: Neck supple without lymphadenopathy. No carotid bruits. No masses palpated.  Cardiovascular: Regular rate with normal S1-S2 sounds. No murmurs, rubs or gallops auscultated. No JVD.  Respiratory: Diminished  breath sounds.  No accessory muscle use. Abdomen: Soft, nontender, nondistended. Active bowel sounds. No masses or hepatosplenomegaly  Musculoskeletal:  2+ dorsalis pedis and radial pulses. Good ROM.  No contractures  Psychiatric: Intact judgment and insight.  Mood appropriate to current condition. Neurologic: No focal neurological deficits. Strength is 5/5 x 4.  CN II - XII grossly intact  Data Reviewed:    CBC    Component Value Date/Time   WBC 12.0 (H) 09/10/2024 0704   RBC 2.61 (L) 09/10/2024 0704   HGB 8.4 (L) 09/10/2024 0704   HGB 11.8 (L) 07/20/2023 0000   HCT 24.3 (L) 09/10/2024 0704   HCT 36.2 (L) 07/20/2023 0000   PLT 131 (L) 09/10/2024 0704   PLT 156 07/20/2023 0000   MCV 93.1 09/10/2024 0704   MCV 98 (H) 07/20/2023 0000   MCH 32.2 09/10/2024 0704   MCHC 34.6 09/10/2024 0704   RDW 15.7 (H) 09/10/2024 0704   RDW 13.0 07/20/2023 0000  LYMPHSABS 1.0 09/08/2024 1603   MONOABS 0.9 09/08/2024 1603   EOSABS 0.1 09/08/2024 1603   BASOSABS 0.0 09/08/2024 1603    CMP     Component Value Date/Time   NA 135 09/10/2024 0820   NA 139 07/20/2023 0000   K 4.2 09/10/2024 0820   CL 95 (L) 09/10/2024 0820   CO2 25 09/10/2024 0820   GLUCOSE 94 09/10/2024 0820   BUN 54 (H) 09/10/2024 0820   BUN 95 (HH) 07/20/2023 0000   CREATININE 7.43 (H) 09/10/2024 0820   CALCIUM  7.8 (L) 09/10/2024 0820   CALCIUM  QNSREP 04/07/2023 0454   PROT 5.6 (L) 09/09/2024 0311   PROT 6.4  04/07/2024 0811   ALBUMIN 1.8 (L) 09/10/2024 0820   ALBUMIN 3.7 04/07/2024 0811   AST 49 (H) 09/09/2024 0311   ALT 37 09/09/2024 0311   ALKPHOS 171 (H) 09/09/2024 0311   BILITOT 1.4 (H) 09/09/2024 0311   BILITOT 0.3 04/07/2024 0811   GFR 19.48 (L) 09/13/2019 1222   EGFR 9 (L) 07/20/2023 0000   GFRNONAA 6 (L) 09/10/2024 0820   Medication Comments documented by Pedro Young' R, CPhT on 09/08/2024 at 2004. Dialysis Tue, Thurs, Sat  Family Communication: Family at bedside  Time spent: 36 minutes.  Author: Landon FORBES Baller, MD 09/10/2024 11:44 AM  For on call review [email or web address].

## 2024-09-10 NOTE — Progress Notes (Signed)
   09/09/24 2144  Assess: MEWS Score  Temp (!) 102.9 F (39.4 C)  BP (!) 158/137  MAP (mmHg) 145  Pulse Rate (!) 137  ECG Heart Rate (!) 148  Resp (!) 22  SpO2 97 %  O2 Device Nasal Cannula  O2 Flow Rate (L/min) 2 L/min  Assess: MEWS Score  MEWS Temp 2  MEWS Systolic 0  MEWS Pulse 3  MEWS RR 1  MEWS LOC 0  MEWS Score 6  MEWS Score Color Red  Assess: if the MEWS score is Yellow or Red  Were vital signs accurate and taken at a resting state? No, vital signs rechecked  Does the patient meet 2 or more of the SIRS criteria? Yes  Does the patient have a confirmed or suspected source of infection? No  MEWS guidelines implemented  Yes, red  Treat  MEWS Interventions Considered administering scheduled or prn medications/treatments as ordered  Take Vital Signs  Increase Vital Sign Frequency  Red: Q1hr x2, continue Q4hrs until patient remains green for 12hrs  Escalate  MEWS: Escalate Red: Discuss with charge nurse and notify provider. Consider notifying RRT. If remains red for 2 hours consider need for higher level of care  Notify: Charge Nurse/RN  Name of Charge Nurse/RN Notified Genette Filbert  Provider Notification  Provider Name/Title Dr. Zachary Ba  Date Provider Notified 09/09/24  Time Provider Notified 2302  Method of Notification  (seured chat)  Notification Reason Change in status (Red Mews)  Provider response See new orders  Date of Provider Response 09/10/24  Time of Provider Response 2305  Assess: SIRS CRITERIA  SIRS Temperature  1  SIRS Respirations  1  SIRS Pulse 1  SIRS WBC 0  SIRS Score Sum  3

## 2024-09-11 DIAGNOSIS — I480 Paroxysmal atrial fibrillation: Secondary | ICD-10-CM | POA: Diagnosis not present

## 2024-09-11 LAB — CBC
HCT: 24.1 % — ABNORMAL LOW (ref 39.0–52.0)
HCT: 23.9 % — ABNORMAL LOW (ref 39.0–52.0)
Hemoglobin: 8.4 g/dL — ABNORMAL LOW (ref 13.0–17.0)
Hemoglobin: 8.3 g/dL — ABNORMAL LOW (ref 13.0–17.0)
MCH: 32.3 pg (ref 26.0–34.0)
MCH: 32.2 pg (ref 26.0–34.0)
MCHC: 34.9 g/dL (ref 30.0–36.0)
MCHC: 34.7 g/dL (ref 30.0–36.0)
MCV: 92.7 fL (ref 80.0–100.0)
MCV: 92.6 fL (ref 80.0–100.0)
Platelets: 138 K/uL — ABNORMAL LOW (ref 150–400)
Platelets: 144 K/uL — ABNORMAL LOW (ref 150–400)
RBC: 2.6 MIL/uL — ABNORMAL LOW (ref 4.22–5.81)
RBC: 2.58 MIL/uL — ABNORMAL LOW (ref 4.22–5.81)
RDW: 15.9 % — ABNORMAL HIGH (ref 11.5–15.5)
RDW: 15.8 % — ABNORMAL HIGH (ref 11.5–15.5)
WBC: 11.2 K/uL — ABNORMAL HIGH (ref 4.0–10.5)
WBC: 11.4 K/uL — ABNORMAL HIGH (ref 4.0–10.5)
nRBC: 0 % (ref 0.0–0.2)
nRBC: 0 % (ref 0.0–0.2)

## 2024-09-11 LAB — COMPREHENSIVE METABOLIC PANEL WITH GFR
ALT: 37 U/L (ref 0–44)
AST: 50 U/L — ABNORMAL HIGH (ref 15–41)
Albumin: 1.7 g/dL — ABNORMAL LOW (ref 3.5–5.0)
Alkaline Phosphatase: 298 U/L — ABNORMAL HIGH (ref 38–126)
Anion gap: 13 (ref 5–15)
BUN: 48 mg/dL — ABNORMAL HIGH (ref 8–23)
CO2: 27 mmol/L (ref 22–32)
Calcium: 7.6 mg/dL — ABNORMAL LOW (ref 8.9–10.3)
Chloride: 94 mmol/L — ABNORMAL LOW (ref 98–111)
Creatinine, Ser: 6.02 mg/dL — ABNORMAL HIGH (ref 0.61–1.24)
GFR, Estimated: 8 mL/min — ABNORMAL LOW (ref >60)
Glucose, Bld: 147 mg/dL — ABNORMAL HIGH (ref 70–99)
Potassium: 4.1 mmol/L (ref 3.5–5.1)
Sodium: 134 mmol/L — ABNORMAL LOW (ref 135–145)
Total Bilirubin: 1.3 mg/dL — ABNORMAL HIGH (ref 0.0–1.2)
Total Protein: 5.4 g/dL — ABNORMAL LOW (ref 6.5–8.1)

## 2024-09-11 MED ORDER — ENSURE MAX PROTEIN PO LIQD
11.0000 [oz_av] | Freq: Every day | ORAL | Status: DC
  Administered 2024-09-11 – 2024-09-14 (×4): 11 [oz_av] via ORAL
  Filled 2024-09-11 (×4): qty 330

## 2024-09-11 MED ORDER — SODIUM CHLORIDE 0.9 % IV SOLN
2.0000 g | INTRAVENOUS | Status: DC
  Administered 2024-09-11 – 2024-09-13 (×3): 2 g via INTRAVENOUS
  Filled 2024-09-11 (×3): qty 20

## 2024-09-11 NOTE — Plan of Care (Signed)

## 2024-09-11 NOTE — Progress Notes (Addendum)
 Nutrition Brief Note  Consult received for assessment of nutritional status, RD team has been following pt since admission, please see last assessment on 11/7. Reviewed meal history and pt is eating bites to 50% of meals. Pt sleeping throughout the day and occasionally missing meals. Seems to be drinking 1  Nepro shake per day.  RD team will continue to follow up with pt to optimize nutritional status while in admitted. Continue current interventions.    INTERVENTION:  Cotninue Renal diet, 1200 mL Fluid Consider liberalizing diet if continued poor po intake  Nepro Shake po BID, each supplement provides 425 kcal and 19 grams protein  Renal MVI daily Continue Renvela daily    NUTRITION DIAGNOSIS:    Severe Malnutrition related to chronic illness as evidenced by energy intake < 75% for > or equal to 1 month, severe muscle depletion, severe fat depletion.  - Ongoing    GOAL:    Patient will meet greater than or equal to 90% of their needs - progressing   Olivia Kenning, RD Registered Dietitian  See Amion for more information

## 2024-09-11 NOTE — Progress Notes (Signed)
 Consultation Progress Note   Patient: Pedro Young FMW:[id number] DOB: 1933-10-21 DOA: 09/08/2024 DOS: the patient was seen and examined on 09/11/2024 Primary service: Jun Osment, Landon BRAVO, MD  Brief hospital course: Pedro Young is a 89 y.o. male with medical history significant of ESRD on T H sat, HLD, HTN, CAD, BPH  Presented with  palpitaions Came from hd hx of ESSRD on HD t H sat had dizziness palpitations no hx of A.fib. Found to have a.fib w RVR HR to 170 - now 140's Cardiology recommended  heparin  and bolus with Amiodarone  This morning, He is in NSR,  Cards recommend transition to PO amiodarone and Eliquis 2.5mg  BID   Subjective Tmax over 100.3, daughter at bedside states that patient has been sleeping excessively. Drowsy but arousable, he complains of abdominal pain denies nausea vomiting. Had a bowel movement which was formed per daughter.  Assessment and Plan:  SIRS Elevated WBC count-11, Tmax 100.3, tachycardic to the 120s, blood cultures obtained so far no growth.  Lactic acid was 0.9, chest x-ray showed no acute pulmonary findings No source identified yet. However he has elevated procalcitonin of 39.5 strongly suggestive of a bacterial infective process. Will start patient on Ceftriaxone  and obtain a CT abdomen/pelvis to rule out any intra-abdominal pathology.   New onset Afib Presented with Palpitations, HR in 140-170s Started on Amiodarone gtt and heparin  drip 11/7-he is in sinus rhythm, transition to PO amiodarone  and eliquis per Cards Palpitations improved 11/8-poorly controlled A-fib, heart rates in the 105- 110 range Today's Vitals   09/10/24 2313 09/11/24 0400 09/11/24 0938 09/11/24 1145  BP: (!) 97/46  (!) 105/44 (!) 115/51  Pulse: 85     Resp: 17  18 19   Temp: 98.5 F (36.9 C) 98.3 F (36.8 C) 98.5 F (36.9 C) 98.8 F (37.1 C)  TempSrc: Oral Oral Oral Oral  SpO2: 98%  98%   Weight:      Height:      PainSc:   0-No pain    Body mass index is 20  kg/m.   Hypomagnesemia - Replaced with 2 g of magnesium .  Recheck levels the morning  ESRD on dialysis (HCC) Tuesdays, Thursdays and Saturday schedule. Nephrology following    HYPERTENSION, BENIGN ESSENTIAL Soft bp, hold BP meds On midodrine   CAD (coronary artery disease) Currently appears to be stable No chest pain When able would restart aspirin  If blood pressure allows would restart Imdur  Continue Crestor    Hyperlipidemia Continue on crestor  10 mg a day      TRH will continue to follow the patient.   Physical Exam: General: Ill-appearing. Awake and alert and oriented x3. Not in any acute distress.  Neck: Neck supple without lymphadenopathy. No carotid bruits. No masses palpated.  Cardiovascular: Regular rate with normal S1-S2 sounds. No murmurs, rubs or gallops auscultated. No JVD.  Respiratory: Diminished  breath sounds.  No accessory muscle use. Abdomen: Soft, nontender, nondistended. Active bowel sounds. No masses or hepatosplenomegaly  Musculoskeletal:  2+ dorsalis pedis and radial pulses. Good ROM.  No contractures  Psychiatric: Intact judgment and insight.  Mood appropriate to current condition. Neurologic: No focal neurological deficits. Strength is 5/5 x 4.  CN II - XII grossly intact  Data Reviewed:    CBC    Component Value Date/Time   WBC 11.2 (H) 09/11/2024 0540   RBC 2.60 (L) 09/11/2024 0540   HGB 8.4 (L) 09/11/2024 0540   HGB 11.8 (L) 07/20/2023 0000   HCT 24.1 (L) 09/11/2024  0540   HCT 36.2 (L) 07/20/2023 0000   PLT 138 (L) 09/11/2024 0540   PLT 156 07/20/2023 0000   MCV 92.7 09/11/2024 0540   MCV 98 (H) 07/20/2023 0000   MCH 32.3 09/11/2024 0540   MCHC 34.9 09/11/2024 0540   RDW 15.9 (H) 09/11/2024 0540   RDW 13.0 07/20/2023 0000   LYMPHSABS 1.0 09/08/2024 1603   MONOABS 0.9 09/08/2024 1603   EOSABS 0.1 09/08/2024 1603   BASOSABS 0.0 09/08/2024 1603    CMP     Component Value Date/Time   NA 135 09/10/2024 0820   NA 139  07/20/2023 0000   K 4.2 09/10/2024 0820   CL 95 (L) 09/10/2024 0820   CO2 25 09/10/2024 0820   GLUCOSE 94 09/10/2024 0820   BUN 54 (H) 09/10/2024 0820   BUN 95 (HH) 07/20/2023 0000   CREATININE 7.43 (H) 09/10/2024 0820   CALCIUM  7.8 (L) 09/10/2024 0820   CALCIUM  QNSREP 04/07/2023 0454   PROT 5.6 (L) 09/09/2024 0311   PROT 6.4 04/07/2024 0811   ALBUMIN 1.8 (L) 09/10/2024 0820   ALBUMIN 3.7 04/07/2024 0811   AST 49 (H) 09/09/2024 0311   ALT 37 09/09/2024 0311   ALKPHOS 171 (H) 09/09/2024 0311   BILITOT 1.4 (H) 09/09/2024 0311   BILITOT 0.3 04/07/2024 0811   GFR 19.48 (L) 09/13/2019 1222   EGFR 9 (L) 07/20/2023 0000   GFRNONAA 6 (L) 09/10/2024 0820   Medication Comments documented by Emmitt Czech' R, CPhT on 09/08/2024 at 2004. Dialysis Tue, Thurs, Sat  Family Communication: Family at bedside  Time spent: 36 minutes.  Author: Landon FORBES Baller, MD 09/11/2024 12:09 PM  For on call review [email or web address].

## 2024-09-11 NOTE — Plan of Care (Signed)

## 2024-09-11 NOTE — TOC Initial Note (Addendum)
 Transition of Care [facility]) - Initial/Assessment Note    Patient Details  Name: Pedro Young MRN: [id number] Date of Birth: [birth year]  Transition of [facility]) CM/SW Contact:    [name] Public, LCSWA Phone Number: 09/11/2024, 11:20 AM  Clinical Narrative:                  CSW received consult for possible SNF placement at time of discharge.Patient gave CSW permission to speak with his daughter Harlene regarding PT recommendation of SNF placement/patients dc plan at time of discharge. CSW called patients daughter Harlene and LVM. CSW awaiting call back. CSW to continue to follow and assist with discharge planning needs.    Update- CSW spoke with patients daughter Harlene who informed CSW to follow up with patients daughter Villa who he lives with to discuss patients dc plans/PT recommendations. Patient in agreement for CSW to speak with his daughter Villa. CSW spoke with patients daughter Villa who confirmed that patient lives with her.      CSW spoke with patients daughter Villa regarding PT recommendation of SNF placement  for patient at time of discharge. Patients daughter expressed understanding of PT recommendation and politely declined SNF placement for patient at time of discharge. Patients daughter would like for patient to return home with her. Patients daughter interested in [facility] PT for patient. CSW informed patients daughter that CM will follow up for home needs. All questions answered. No further questions reported at this time.  CSW informed CM Terri.   Patient Goals and CMS Choice            Expected Discharge Plan and Services                                              Prior Living Arrangements/Services                       Activities of Daily Living   ADL Screening (condition at time of admission) Independently performs ADLs?: Yes (appropriate for developmental age) Is the patient deaf or have difficulty hearing?: Yes Does the patient have  difficulty seeing, even when wearing glasses/contacts?: No Does the patient have difficulty concentrating, remembering, or making decisions?: Yes  Permission Sought/Granted                  Emotional Assessment              Admission diagnosis:  Atrial fibrillation with RVR (HCC) [I48.91] Atrial fibrillation, unspecified type (HCC) [I48.91] Patient Active Problem List   Diagnosis Date Noted   Hypotension 09/09/2024   Atrial fibrillation with RVR (HCC) 09/08/2024   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   ESRD on dialysis (HCC) 04/18/2023   BPH (benign prostatic hyperplasia) 04/18/2023   Acute on chronic anemia 04/18/2023   Protein-calorie malnutrition, severe 04/07/2023   Hyponatremia 04/06/2023   CAD (coronary artery disease) 04/06/2023   Metabolic acidosis 04/06/2023   Subclinical hypothyroidism 05/25/2022   Angina pectoris 06/19/2020   Hyperlipidemia 05/16/2013   Anemia of chronic renal failure, stage 4 (severe) (HCC) 01/18/2010   Liver abscess via hepatic artery 2011, 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 01/18/2010   HEMATURIA, HX OF 01/18/2010   Gastritis and gastroduodenitis 06/27/2009   Calculus of gallbladder and bile duct 06/21/2009   ABDOMINAL PAIN, GENERALIZED 06/19/2009   Pure hyperglyceridemia 06/18/2009   HYPERTENSION, BENIGN ESSENTIAL 06/18/2009  PCP:  Pcp, No Pharmacy:   [id number] [place],  - [address] AT [facility] [address] [place] [address] Phone: [phone] Fax: [phone]     Social Drivers of Health (SDOH) Social History: SDOH Screenings   Food Insecurity: No Food Insecurity (09/09/2024)  Housing: Low Risk  (09/09/2024)  Transportation Needs: No Transportation Needs (09/09/2024)  Utilities: Not At Risk (09/09/2024)  Depression (PHQ2-9): Low Risk  (06/01/2023)  Financial Resource Strain: Low Risk  (04/06/2024)   Received from [facility]  Physical Activity: Insufficiently Active  (07/08/2023)   Received from [facility]  Social Connections: Socially Isolated (09/09/2024)  Stress: No Stress Concern Present (07/08/2023)   Received from [facility]  Tobacco Use: Medium Risk (09/08/2024)   SDOH Interventions:     Readmission Risk Interventions    04/20/2023    2:37 PM 04/08/2023   10:49 AM  Readmission Risk Prevention Plan  Transportation Screening Complete Complete  PCP or Specialist Appt within 5-7 Days  Complete  PCP or Specialist Appt within 3-5 Days Complete   Home Care Screening  Complete  Medication Review (RN CM)  Complete  HRI or Home Care Consult Complete   Social Work Consult for Recovery Care Planning/Counseling Complete   Palliative Care Screening Complete   Medication Review Oceanographer) Referral to Pharmacy

## 2024-09-11 NOTE — Progress Notes (Signed)
 [facility] Progress Note   Subjective:  Completed dialysis yesterday with no UF  Seen in room. Not eating much, weak.  Low grade fever overnight. No rigors today.    Objective Vitals:   09/10/24 1617 09/10/24 1941 09/10/24 2313 09/11/24 0400  BP: (!) 104/46 (!) 103/48 (!) 97/46   Pulse: (!) 103 96 85   Resp: (!) 22 (!) 21 17   Temp: 100.3 F (37.9 C) 98.6 F (37 C) 98.5 F (36.9 C) 98.3 F (36.8 C)  TempSrc: Axillary Oral Oral Oral  SpO2: 99% 99% 98%   Weight:      Height:        Additional Objective Labs: Basic Metabolic Panel: Recent Labs  Lab 09/08/24 1603 09/08/24 1624 09/08/24 1625 09/09/24 0311 09/10/24 0820  NA 138 137 137 132* 135  K 3.1* 3.1* 3.1* 4.2 4.2  CL 93* 94*  --  92* 95*  CO2 26  --   --  25 25  GLUCOSE 88 85  --  95 94  BUN 15 16  --  25* 54*  CREATININE 3.38* 3.10*  --  4.55* 7.43*  CALCIUM  8.2*  --   --  7.3* 7.8*  PHOS  --   --   --  3.0 4.1   CBC: Recent Labs  Lab 09/08/24 1603 09/08/24 1624 09/09/24 0311 09/10/24 0704 09/11/24 0540  WBC 9.3  --  10.1 12.0* 11.2*  NEUTROABS 7.3  --   --   --   --   HGB 11.2*   < > 9.7* 8.4* 8.4*  HCT 33.3*   < > 29.7* 24.3* 24.1*  MCV 97.7  --  99.7 93.1 92.7  PLT 149*  --  146* 131* 138*   < > = values in this interval not displayed.   Blood Culture    Component Value Date/Time   SDES BLOOD RIGHT ARM 09/10/2024 0704   SPECREQUEST  09/10/2024 0704    BOTTLES DRAWN AEROBIC AND ANAEROBIC Blood Culture results may not be optimal due to an inadequate volume of blood received in culture bottles   CULT  09/10/2024 0704    NO GROWTH 1 DAY Performed at [facility], [address] [place], [place] [address]    REPTSTATUS PENDING 09/10/2024 9295     Physical Exam General: Chronically ill appeairng, nad  Heart: Irregular  Lungs: Clear, normal wob Abdomen: nontender Extremities: no edema  Dialysis Access: L AVF   Medications:   amiodarone  200 mg Oral BID    apixaban  2.5 mg Oral BID   calcitRIOL  0.25 mcg Oral Q T,Th,Sa-HD   feeding supplement (NEPRO CARB STEADY)  237 mL Oral BID BM   midodrine  5 mg Oral TID WC   multivitamin  1 tablet Oral QHS   rosuvastatin   10 mg Oral Daily   sevelamer carbonate  800 mg Oral TID WC   sodium chloride  flush  3 mL Intravenous Q12H   tamsulosin   0.4 mg Oral Daily    Dialysis Orders:  GOC TTS 3:30 BFR 350 EDW 54 kg 2K/2.5Ca  AVF  -No heparin   -Mircera 60 q wks (last 10/30)  -Calcitriol 0.25 q HD  Post HD wt 11/6 53.2 kg   Assessment/Plan: AFIb with RVR. New onset. Cardiology consulted. S/p IV meds. Now on Eliquis, amiodarone. Per primary team Fevers/leukocytosis. Per primary. Resp panel neg. CXR ned. Blood cultures ngtd.  ESRD.  HD TTS. Continue on schedule.  Hypotension. Noted on arrival.  On midodrine here. Improved Volume. Appears euvolemic. Minimal  UF with HD  Anemia. Hgb 8-9. Next ESA due 11/13.  Follow trends.  Metabolic bone disease.  CorrCa/Phos acceptable. Resume calcitriol. Follow trends.  Nutrition. Low albumin. Cont prot supp  CAD. Medical management per cards. Imdur  and metoprolol  on hold for hypotension     Maisie Ronnald Acosta PA-C [facility] 09/11/2024,9:23 AM

## 2024-09-12 ENCOUNTER — Other Ambulatory Visit (HOSPITAL_COMMUNITY): Payer: Self-pay

## 2024-09-12 ENCOUNTER — Inpatient Hospital Stay (HOSPITAL_COMMUNITY)

## 2024-09-12 DIAGNOSIS — I480 Paroxysmal atrial fibrillation: Secondary | ICD-10-CM | POA: Diagnosis not present

## 2024-09-12 LAB — CBC
HCT: 22.6 % — ABNORMAL LOW (ref 39.0–52.0)
Hemoglobin: 7.9 g/dL — ABNORMAL LOW (ref 13.0–17.0)
MCH: 31.9 pg (ref 26.0–34.0)
MCHC: 35 g/dL (ref 30.0–36.0)
MCV: 91.1 fL (ref 80.0–100.0)
Platelets: 165 K/uL (ref 150–400)
RBC: 2.48 MIL/uL — ABNORMAL LOW (ref 4.22–5.81)
RDW: 16 % — ABNORMAL HIGH (ref 11.5–15.5)
WBC: 12.2 K/uL — ABNORMAL HIGH (ref 4.0–10.5)
nRBC: 0 % (ref 0.0–0.2)

## 2024-09-12 MED ORDER — CHLORHEXIDINE GLUCONATE CLOTH 2 % EX PADS
6.0000 | MEDICATED_PAD | Freq: Every day | CUTANEOUS | Status: DC
  Administered 2024-09-13 – 2024-09-15 (×3): 6 via TOPICAL

## 2024-09-12 MED ORDER — IOHEXOL 350 MG/ML SOLN
75.0000 mL | Freq: Once | INTRAVENOUS | Status: AC | PRN
  Administered 2024-09-12: 75 mL via INTRAVENOUS

## 2024-09-12 MED ORDER — APIXABAN 5 MG PO TABS
7.5000 mg | ORAL_TABLET | Freq: Once | ORAL | Status: AC
  Administered 2024-09-12: 7.5 mg via ORAL
  Filled 2024-09-12: qty 1

## 2024-09-12 MED ORDER — METOPROLOL TARTRATE 5 MG/5ML IV SOLN
INTRAVENOUS | Status: AC
  Filled 2024-09-12: qty 5

## 2024-09-12 MED ORDER — APIXABAN 5 MG PO TABS
10.0000 mg | ORAL_TABLET | Freq: Two times a day (BID) | ORAL | Status: DC
  Administered 2024-09-12 – 2024-09-14 (×3): 10 mg via ORAL
  Filled 2024-09-12 (×4): qty 2

## 2024-09-12 MED ORDER — METOPROLOL TARTRATE 5 MG/5ML IV SOLN
5.0000 mg | Freq: Once | INTRAVENOUS | Status: AC
  Administered 2024-09-12: 5 mg via INTRAVENOUS

## 2024-09-12 MED ORDER — APIXABAN 5 MG PO TABS: 5.0000 mg | ORAL_TABLET | Freq: Two times a day (BID) | ORAL | Status: DC

## 2024-09-12 NOTE — Progress Notes (Signed)
 Consultation Progress Note   Patient: Pedro Young FMW:[id number] DOB: Apr 09, 1933 DOA: 09/08/2024 DOS: the patient was seen and examined on 09/12/2024 Primary service: Airam Heidecker, Landon BRAVO, MD  Brief hospital course: Udell Mazzocco is a 89 y.o. male with medical history significant of ESRD on T H sat, HLD, HTN, CAD, BPH  Presented with  palpitaions Came from hd hx of ESSRD on HD t H sat had dizziness palpitations no hx of A.fib. Found to have a.fib w RVR HR to 170 - now 140's Cardiology recommended  heparin  and bolus with Amiodarone  This morning, He is in NSR,  Cards recommend transition to PO amiodarone and Eliquis 2.5mg  BID   Subjective Tmax over 100.3, daughter at bedside states that patient has been sleeping excessively. Drowsy but arousable, he complains of abdominal pain denies nausea vomiting. Had a bowel movement which was formed per daughter.  11/10-patient is more alert this morning, 2 daughters at bedside.  Updated them on results of CT scan and further evaluation with MRI. Questions were answered to the best of my ability, they would want results of the MRI shared with them when resulted.  Assessment and Plan:  SIRS Elevated WBC count-11, Tmax 100.3, tachycardic to the 120s, blood cultures obtained so far no growth.  Lactic acid was 0.9, chest x-ray showed no acute pulmonary findings No source identified yet. However he has elevated procalcitonin of 39.5 strongly suggestive of a bacterial infective process. Will start patient on Ceftriaxone  and obtain a CT abdomen/pelvis to rule out any intra-abdominal pathology.  11/10-discussed results of CT abdomen and pelvis with radiologist -Occlusive thrombus in the right portal vein  -Nonocclusive thrombus in the bifurcation of the left and right portal veins and small volume nonocclusive thrombus in the proximal left portal venous anatomy.  -Main portal vein is patent. - Clustered areas of ill-defined low-density in the periphery  of the right hepatic lobe, indeterminate. -3. 8 mm lesion lower pole right kidney may enhance raising the question of a tiny renal cell carcinoma. - Diffuse diverticular disease in the colon with marked wall thickening in the sigmoid segment.  - Bladder wall trabeculation with multiple bladder wall diverticuli suggests underlying component of bladder outlet obstruction.   MRI of the abdomen W/WO ordered to further evaluate and exclude underlying infiltrative mass lesion.  Paged/Consulted palliative care for goals of care discussion.  New onset Afib Presented with Palpitations, HR in 140-170s Started on Amiodarone gtt and heparin  drip 11/7-he is in sinus rhythm, transition to PO amiodarone  and eliquis per Cards Palpitations improved 11/8-poorly controlled A-fib, heart rates in the 105- 110 range Today's Vitals   09/12/24 0007 09/12/24 0500 09/12/24 0734 09/12/24 1105  BP: (!) 116/55 (!) 118/49 (!) 117/51 (!) 119/59  Pulse: 92 89 93 79  Resp: 18 18    Temp: (!) 97.5 F (36.4 C) 99.5 F (37.5 C) 97.9 F (36.6 C) 97.6 F (36.4 C)  TempSrc: Oral Axillary Oral Oral  SpO2: 99% 100% 97% 98%  Weight:      Height:      PainSc: 0-No pain      Body mass index is 20 kg/m.   Hypomagnesemia - Replaced with 2 g of magnesium .  Recheck levels the morning  ESRD on dialysis (HCC) Tuesdays, Thursdays and Saturday schedule. Nephrology following    HYPERTENSION, BENIGN ESSENTIAL Soft bp, hold BP meds On midodrine   CAD (coronary artery disease) Currently appears to be stable No chest pain When able would restart aspirin  If blood  pressure allows would restart Imdur  Continue Crestor    Hyperlipidemia Continue on crestor  10 mg a day  Severe Malnutrition related to chronic illness  Nepro Shake po BID, continue Renal MVI daily Continue Renvela daily.   TRH will continue to follow the patient.   Physical Exam: General: Ill-appearing. Awake and alert and oriented x3. Not in  any acute distress.  Neck: Neck supple without lymphadenopathy. No carotid bruits. No masses palpated.  Cardiovascular: Regular rate with normal S1-S2 sounds. No murmurs, rubs or gallops auscultated. No JVD.  Respiratory: Diminished  breath sounds.  No accessory muscle use. Abdomen: Soft, generalized mild tenderness, nondistended. Active bowel sounds. No masses or hepatosplenomegaly  Musculoskeletal:  2+ dorsalis pedis and radial pulses. Good ROM.  No contractures  Psychiatric: Intact judgment and insight.  Mood appropriate to current condition. Neurologic: No focal neurological deficits. Strength is 5/5 x 4.  CN II - XII grossly intact  Data Reviewed:    CBC    Component Value Date/Time   WBC 12.2 (H) 09/12/2024 1036   RBC 2.48 (L) 09/12/2024 1036   HGB 7.9 (L) 09/12/2024 1036   HGB 11.8 (L) 07/20/2023 0000   HCT 22.6 (L) 09/12/2024 1036   HCT 36.2 (L) 07/20/2023 0000   PLT 165 09/12/2024 1036   PLT 156 07/20/2023 0000   MCV 91.1 09/12/2024 1036   MCV 98 (H) 07/20/2023 0000   MCH 31.9 09/12/2024 1036   MCHC 35.0 09/12/2024 1036   RDW 16.0 (H) 09/12/2024 1036   RDW 13.0 07/20/2023 0000   LYMPHSABS 1.0 09/08/2024 1603   MONOABS 0.9 09/08/2024 1603   EOSABS 0.1 09/08/2024 1603   BASOSABS 0.0 09/08/2024 1603    CMP     Component Value Date/Time   NA 134 (L) 09/11/2024 1249   NA 139 07/20/2023 0000   K 4.1 09/11/2024 1249   CL 94 (L) 09/11/2024 1249   CO2 27 09/11/2024 1249   GLUCOSE 147 (H) 09/11/2024 1249   BUN 48 (H) 09/11/2024 1249   BUN 95 (HH) 07/20/2023 0000   CREATININE 6.02 (H) 09/11/2024 1249   CALCIUM  7.6 (L) 09/11/2024 1249   CALCIUM  QNSREP 04/07/2023 0454   PROT 5.4 (L) 09/11/2024 1249   PROT 6.4 04/07/2024 0811   ALBUMIN 1.7 (L) 09/11/2024 1249   ALBUMIN 3.7 04/07/2024 0811   AST 50 (H) 09/11/2024 1249   ALT 37 09/11/2024 1249   ALKPHOS 298 (H) 09/11/2024 1249   BILITOT 1.3 (H) 09/11/2024 1249   BILITOT 0.3 04/07/2024 0811   GFR 19.48 (L) 09/13/2019  1222   EGFR 9 (L) 07/20/2023 0000   GFRNONAA 8 (L) 09/11/2024 1249   Medication Comments documented by Emmitt Czech' R, CPhT on 09/08/2024 at 2004. Dialysis Tue, Thurs, Sat  Family Communication: Family at bedside  Time spent: 36 minutes.  Author: Landon FORBES Baller, MD 09/12/2024 12:13 PM  For on call review [email or web address].

## 2024-09-12 NOTE — Progress Notes (Signed)
 [facility] Progress Note   Subjective:  Seen in room. Daughters at bedside Fever overnight. Abnormal abdominal CT- - For MRI Abd today Very weak.   Objective Vitals:   09/12/24 0006 09/12/24 0007 09/12/24 0500 09/12/24 0734  BP:  (!) 116/55 (!) 118/49 (!) 117/51  Pulse: 92 92 89 93  Resp:  18 18   Temp:  (!) 97.5 F (36.4 C) 99.5 F (37.5 C) 97.9 F (36.6 C)  TempSrc:  Oral Axillary Oral  SpO2: 99% 99% 100% 97%  Weight:      Height:        Additional Objective Labs: Basic Metabolic Panel: Recent Labs  Lab 09/09/24 0311 09/10/24 0820 09/11/24 1249  NA 132* 135 134*  K 4.2 4.2 4.1  CL 92* 95* 94*  CO2 25 25 27   GLUCOSE 95 94 147*  BUN 25* 54* 48*  CREATININE 4.55* 7.43* 6.02*  CALCIUM  7.3* 7.8* 7.6*  PHOS 3.0 4.1  --    CBC: Recent Labs  Lab 09/08/24 1603 09/08/24 1624 09/09/24 0311 09/10/24 0704 09/11/24 0540 09/11/24 1249  WBC 9.3  --  10.1 12.0* 11.2* 11.4*  NEUTROABS 7.3  --   --   --   --   --   HGB 11.2*   < > 9.7* 8.4* 8.4* 8.3*  HCT 33.3*   < > 29.7* 24.3* 24.1* 23.9*  MCV 97.7  --  99.7 93.1 92.7 92.6  PLT 149*  --  146* 131* 138* 144*   < > = values in this interval not displayed.   Blood Culture    Component Value Date/Time   SDES BLOOD RIGHT ARM 09/10/2024 0704   SPECREQUEST  09/10/2024 0704    BOTTLES DRAWN AEROBIC AND ANAEROBIC Blood Culture results may not be optimal due to an inadequate volume of blood received in culture bottles   CULT  09/10/2024 0704    NO GROWTH 2 DAYS Performed at [facility], [address], [place], [place] [address]    REPTSTATUS PENDING 09/10/2024 9295     Physical Exam General: Chronically ill appeairng, nad  Heart: Irregular  Lungs: Clear, normal wob Abdomen: nontender Extremities: no edema  Dialysis Access: L AVF   Medications:  cefTRIAXone  (ROCEPHIN )  IV 2 g (09/11/24 1351)    apixaban  2.5 mg Oral BID   calcitRIOL  0.25 mcg Oral Q T,Th,Sa-HD   feeding supplement  (NEPRO CARB STEADY)  237 mL Oral BID BM   midodrine  5 mg Oral TID WC   multivitamin  1 tablet Oral QHS   Ensure Max Protein  11 oz Oral Daily   rosuvastatin   10 mg Oral Daily   sevelamer carbonate  800 mg Oral TID WC   sodium chloride  flush  3 mL Intravenous Q12H   tamsulosin   0.4 mg Oral Daily    Dialysis Orders:  GOC TTS 3:30 BFR 350 EDW 54 kg 2K/2.5Ca  AVF  -No heparin   -Mircera 60 q wks (last 10/30)  -Calcitriol 0.25 q HD  Post HD wt 11/6 53.2 kg   Assessment/Plan: AFIb with RVR. New onset. Cardiology consulted. S/p IV meds. Now on Eliquis, amiodarone. Per primary team Fevers/leukocytosis. Per primary. Resp panel neg. CXR ned. Blood cultures ngtd. Abd CT today with liver/kidney masses. For MRI today to further evaluate.  ESRD.  HD TTS. Continue on schedule.  Hypotension. Noted on arrival. On midodrine here.  Volume. Appears euvolemic. Minimal  UF with HD  Anemia. Hgb 8-9. Next ESA  due 11/13. Note new concern for malignancy.  Metabolic bone disease.  CorrCa/Phos acceptable. Resume calcitriol. Follow trends.  Nutrition. Low albumin. Cont prot supp  CAD. Medical management per cards. Imdur  and metoprolol  on hold for hypotension     Maisie Ronnald Acosta PA-C [facility] 09/12/2024,9:49 AM

## 2024-09-12 NOTE — Progress Notes (Signed)
 Occupational Therapy Treatment Patient Details Name: Pedro Young MRN: [id number] DOB: 08/20/1933 Today's Date: 09/12/2024   History of present illness 89 yo male presents to ED 09/08/24 from HD with dizziness and hypotension. Pt  in Afib with RVR upon arrival. PMHx: CAD, BPH, HLD, HTN, preDM, ESRD on HD TTS   OT comments  Pt reports feeling better than upon admission. More alert. Mobilizing with min assist to EOB, to stand with RW and ambulate to bathroom. Pt requires max assist for UB dressing, total assist for posterior pericare and set up for washing face and hands upon return back to bed. Pt grimacing with movement, but without specific complaint of pain. Endorses a little bit of dizziness with mobility. Negative for orthostatic hypotension (see flow sheet). Pt HR as high at 157 non sustained. Visibly fatigued after walking to bathroom and wheeled back to bed in his travel chair. Supportive family at bedside wish to take pt home when medically ready. Updated d/c recommendation to HHOT.       If plan is discharge home, recommend the following:  A little help with walking and/or transfers;A lot of help with bathing/dressing/bathroom;Assistance with cooking/housework;Direct supervision/assist for medications management;Direct supervision/assist for financial management;Assist for transportation;Help with stairs or ramp for entrance   Equipment Recommendations  None recommended by OT    Recommendations for Other Services      Precautions / Restrictions Precautions Precautions: Fall Recall of Precautions/Restrictions: Impaired Precaution/Restrictions Comments: watch HR Restrictions Weight Bearing Restrictions Per Provider Order: No       Mobility Bed Mobility Overal bed mobility: Needs Assistance Bed Mobility: Supine to Sit, Sit to Supine     Supine to sit: Min assist Sit to supine: Contact guard assist   General bed mobility comments: increased time, assist to raise trunk  to sit EOB, no physical assist to return to supine    Transfers Overall transfer level: Needs assistance Equipment used: Rolling walker (2 wheels) Transfers: Sit to/from Stand Sit to Stand: Min assist           General transfer comment: assist to rise and steady from bed and toilet     Balance Overall balance assessment: Needs assistance   Sitting balance-Leahy Scale: Poor   Postural control: Posterior lean Standing balance support: Bilateral upper extremity supported Standing balance-Leahy Scale: Poor                             ADL either performed or assessed with clinical judgement   ADL Overall ADL's : Needs assistance/impaired     Grooming: Wash/dry hands;Wash/dry face;Bed level;Set up           Upper Body Dressing : Maximal assistance;Sitting Upper Body Dressing Details (indicate cue type and reason): backside gown     Toilet Transfer: Minimal assistance;Ambulation;Rolling walker (2 wheels);Regular Teacher, Adult Education Details (indicate cue type and reason): cues to use grab bar, assist to control descent to low toilet, min assist to stand with posterior bias Toileting- Clothing Manipulation and Hygiene: Total assistance;Sit to/from stand Toileting - Clothing Manipulation Details (indicate cue type and reason): for thoroughness     Functional mobility during ADLs: Minimal assistance;+2 for safety/equipment;Rolling walker (2 wheels) General ADL Comments: pt needing reminders to avoid releasing walker in standing    Extremity/Trunk Assessment              Vision       Perception     Praxis  Communication Communication Communication: Impaired Factors Affecting Communication: Hearing impaired;Other (comment) (minimally verbal)   Cognition Arousal: Alert Behavior During Therapy: Flat affect Cognition: Cognition impaired     Awareness: Online awareness impaired, Intellectual awareness intact Memory impairment (select all  impairments): Short-term memory   Executive functioning impairment (select all impairments): Sequencing, Initiation, Problem solving OT - Cognition Comments: slow processing speed                 Following commands: Impaired Following commands impaired: Follows one step commands with increased time      Cueing   Cueing Techniques: Verbal cues, Tactile cues, Gestural cues  Exercises      Shoulder Instructions       General Comments      Pertinent Vitals/ Pain       Pain Assessment Pain Assessment: Faces Faces Pain Scale: Hurts little more Pain Location: generalized Pain Descriptors / Indicators: Grimacing Pain Intervention(s): Monitored during session, Repositioned  Home Living                                          Prior Functioning/Environment              Frequency  Min 2X/week        Progress Toward Goals  OT Goals(current goals can now be found in the care plan section)  Progress towards OT goals: Progressing toward goals  Acute Rehab OT Goals OT Goal Formulation: With patient Time For Goal Achievement: 09/23/24 Potential to Achieve Goals: Good ADL Goals Pt Will Perform Grooming: with min assist;standing Pt Will Perform Upper Body Bathing: with supervision;sitting Pt Will Perform Upper Body Dressing: with supervision;sitting Pt Will Transfer to Toilet: with min assist;ambulating;bedside commode Pt Will Perform Toileting - Clothing Manipulation and hygiene: with mod assist;sit to/from stand Additional ADL Goal #1: Pt will complete bed mobility with min assist in preparation for ADLs.  Plan      Co-evaluation                 AM-PAC OT 6 Clicks Daily Activity     Outcome Measure   Help from another person eating meals?: A Little Help from another person taking care of personal grooming?: A Little Help from another person toileting, which includes using toliet, bedpan, or urinal?: Total Help from another person  bathing (including washing, rinsing, drying)?: A Lot Help from another person to put on and taking off regular upper body clothing?: A Lot Help from another person to put on and taking off regular lower body clothing?: Total 6 Click Score: 12    End of Session Equipment Utilized During Treatment: Gait belt;Rolling walker (2 wheels)  OT Visit Diagnosis: Unsteadiness on feet (R26.81);Other abnormalities of gait and mobility (R26.89);Pain;Muscle weakness (generalized) (M62.81);Other symptoms and signs involving cognitive function   Activity Tolerance Patient limited by fatigue;Treatment limited secondary to medical complications (Comment) (elevated HR)   Patient Left in bed;with call bell/phone within reach;with bed alarm set;with family/visitor present   Nurse Communication Mobility status;Other (comment) (aware HR is elevated)        Time: 8779-8751 OT Time Calculation (min): 28 min  Charges: OT General Charges $OT Visit: 1 Visit OT Treatments $Self Care/Home Management : 8-22 mins  Mliss HERO, OTR/L Acute Rehabilitation Services Office: [phone]   Kennth Mliss Helling 09/12/2024, 1:14 PM

## 2024-09-12 NOTE — Progress Notes (Signed)
 Physical Therapy Treatment Patient Details Name: Pedro Young MRN: [id number] DOB: Aug 31, 1933 Today's Date: 09/12/2024   History of Present Illness 89 yo male presents to ED 09/08/24 from HD with dizziness and hypotension. Pt  in Afib with RVR upon arrival. PMHx: CAD, BPH, HLD, HTN, preDM, ESRD on HD TTS    PT Comments  The pt was agreeable to session with focus on progressing OOB mobility. He demos improved ability to assist with bed mobility and transfers, needing largely minA to complete with use of RW. Pt continues to present with deficits in endurance, strength, and dynamic stability, needing minA at times to steady with RW and chair follow due to onset of dizziness and fatigue with walking. HR elevated to 145-154bpm with afib after exertion and continued even after pt returned to bed, BP soft but stable. Discussed plan for return home with family, currently needs physical assist for all mobility and recommend use of WC for most mobility. Will continue to benefit from skilled PT acutely to progress functional strength and endurance to decrease dependence on caregiver assist for household mobility.     If plan is discharge home, recommend the following: A lot of help with walking and/or transfers;A lot of help with bathing/dressing/bathroom;Assistance with cooking/housework;Assist for transportation;Help with stairs or ramp for entrance   Can travel by private vehicle     Yes  Equipment Recommendations  None recommended by PT    Recommendations for Other Services       Precautions / Restrictions Precautions Precautions: Fall Recall of Precautions/Restrictions: Impaired Precaution/Restrictions Comments: watch HR Restrictions Weight Bearing Restrictions Per Provider Order: No     Mobility  Bed Mobility Overal bed mobility: Needs Assistance Bed Mobility: Supine to Sit, Sit to Supine     Supine to sit: Min assist Sit to supine: Contact guard assist   General bed mobility  comments: increased time, assist to raise trunk to sit EOB, no physical assist to return to supine    Transfers Overall transfer level: Needs assistance Equipment used: Rolling walker (2 wheels) Transfers: Sit to/from Stand Sit to Stand: Min assist           General transfer comment: assist to rise and steady from bed and toilet    Ambulation/Gait Ambulation/Gait assistance: Min assist, +2 safety/equipment (chair follow) Gait Distance (Feet): 15 Feet (+ 8 ft) Assistive device: Rolling walker (2 wheels) Gait Pattern/deviations: Step-through pattern, Decreased stride length, Trunk flexed Gait velocity: decreased Gait velocity interpretation: <1.31 ft/sec, indicative of household ambulator   General Gait Details: pt with small steps, trunk flexed, heavy dependence on RW. reports increased dizziness after arriving to bathroom, BP stable and tele initially not reading HR. max 154bpm once it was reading, afib    Balance Overall balance assessment: Needs assistance Sitting-balance support: Feet supported, No upper extremity supported Sitting balance-Leahy Scale: Fair Sitting balance - Comments: CGA for safety, pt prefers UE support Postural control: Posterior lean Standing balance support: Bilateral upper extremity supported Standing balance-Leahy Scale: Poor Standing balance comment: BUE support and minA                            Communication Communication Communication: Impaired Factors Affecting Communication: Hearing impaired;Other (comment) (minimally verbal)  Cognition Arousal: Alert Behavior During Therapy: Flat affect   PT - Cognitive impairments: No apparent impairments                       PT -  Cognition Comments: limited assessment due to flat affect, pt following commands in session with increased time. Following commands: Impaired Following commands impaired: Follows one step commands with increased time    Cueing Cueing Techniques:  Verbal cues, Tactile cues, Gestural cues  Exercises      General Comments General comments (skin integrity, edema, etc.): HR to 154bpm with activity, BP soft but stable with MAP low of 66. SpO2 stable on RA. family present and supportive      Pertinent Vitals/Pain Pain Assessment Pain Assessment: Faces Faces Pain Scale: Hurts little more Pain Location: generalized Pain Descriptors / Indicators: Grimacing Pain Intervention(s): Limited activity within patient's tolerance, Monitored during session, Repositioned     PT Goals (current goals can now be found in the care plan section) Acute Rehab PT Goals Patient Stated Goal: to go home PT Goal Formulation: With patient Time For Goal Achievement: 09/24/24 Potential to Achieve Goals: Good Progress towards PT goals: Progressing toward goals    Frequency    Min 2X/week      PT Plan      Co-evaluation PT/OT/SLP Co-Evaluation/Treatment: Yes Reason for Co-Treatment: Complexity of the patient's impairments (multi-system involvement);For patient/therapist safety;To address functional/ADL transfers PT goals addressed during session: Mobility/safety with mobility;Balance;Proper use of DME;Strengthening/ROM OT goals addressed during session: ADL's and self-care      AM-PAC PT 6 Clicks Mobility   Outcome Measure  Help needed turning from your back to your side while in a flat bed without using bedrails?: A Little Help needed moving from lying on your back to sitting on the side of a flat bed without using bedrails?: A Little Help needed moving to and from a bed to a chair (including a wheelchair)?: A Lot Help needed standing up from a chair using your arms (e.g., wheelchair or bedside chair)?: A Lot Help needed to walk in hospital room?: Total (<20 ft) Help needed climbing 3-5 steps with a railing? : Total 6 Click Score: 12    End of Session Equipment Utilized During Treatment: Gait belt Activity Tolerance: Patient limited by  fatigue;Treatment limited secondary to medical complications (Comment) (afib with RVR rates 145-154bpm) Patient left: in bed;with call bell/phone within reach;with bed alarm set;with family/visitor present Nurse Communication: Mobility status PT Visit Diagnosis: Muscle weakness (generalized) (M62.81)     Time: 8782-8757 PT Time Calculation (min) (ACUTE ONLY): 25 min  Charges:    $Gait Training: 8-22 mins PT General Charges $$ ACUTE PT VISIT: 1 Visit                     Izetta Call, PT, DPT   Acute Rehabilitation Department Office [phone] Secure Chat Communication Preferred   Izetta JULIANNA Call 09/12/2024, 1:54 PM

## 2024-09-12 NOTE — Plan of Care (Signed)

## 2024-09-13 ENCOUNTER — Inpatient Hospital Stay (HOSPITAL_COMMUNITY)

## 2024-09-13 DIAGNOSIS — I4891 Unspecified atrial fibrillation: Secondary | ICD-10-CM | POA: Diagnosis not present

## 2024-09-13 DIAGNOSIS — Z7189 Other specified counseling: Secondary | ICD-10-CM | POA: Diagnosis not present

## 2024-09-13 DIAGNOSIS — Z711 Person with feared health complaint in whom no diagnosis is made: Secondary | ICD-10-CM

## 2024-09-13 DIAGNOSIS — Z789 Other specified health status: Secondary | ICD-10-CM | POA: Diagnosis not present

## 2024-09-13 DIAGNOSIS — R531 Weakness: Secondary | ICD-10-CM

## 2024-09-13 DIAGNOSIS — Z515 Encounter for palliative care: Secondary | ICD-10-CM | POA: Diagnosis not present

## 2024-09-13 LAB — RENAL FUNCTION PANEL
Albumin: 1.6 g/dL — ABNORMAL LOW (ref 3.5–5.0)
Anion gap: 16 — ABNORMAL HIGH (ref 5–15)
BUN: 85 mg/dL — ABNORMAL HIGH (ref 8–23)
CO2: 25 mmol/L (ref 22–32)
Calcium: 7.9 mg/dL — ABNORMAL LOW (ref 8.9–10.3)
Chloride: 92 mmol/L — ABNORMAL LOW (ref 98–111)
Creatinine, Ser: 8.03 mg/dL — ABNORMAL HIGH (ref 0.61–1.24)
GFR, Estimated: 6 mL/min — ABNORMAL LOW (ref >60)
Glucose, Bld: 94 mg/dL (ref 70–99)
Phosphorus: 3.4 mg/dL (ref 2.5–4.6)
Potassium: 4 mmol/L (ref 3.5–5.1)
Sodium: 133 mmol/L — ABNORMAL LOW (ref 135–145)

## 2024-09-13 LAB — CBC
HCT: 22.9 % — ABNORMAL LOW (ref 39.0–52.0)
Hemoglobin: 8.1 g/dL — ABNORMAL LOW (ref 13.0–17.0)
MCH: 31.8 pg (ref 26.0–34.0)
MCHC: 35.4 g/dL (ref 30.0–36.0)
MCV: 89.8 fL (ref 80.0–100.0)
Platelets: 211 K/uL (ref 150–400)
RBC: 2.55 MIL/uL — ABNORMAL LOW (ref 4.22–5.81)
RDW: 16.2 % — ABNORMAL HIGH (ref 11.5–15.5)
WBC: 12.7 K/uL — ABNORMAL HIGH (ref 4.0–10.5)
nRBC: 0 % (ref 0.0–0.2)

## 2024-09-13 LAB — MAGNESIUM: Magnesium: 2.3 mg/dL (ref 1.7–2.4)

## 2024-09-13 LAB — HEPATITIS B SURFACE ANTIBODY, QUANTITATIVE: Hep B S AB Quant (Post): 342 m[IU]/mL

## 2024-09-13 MED ORDER — PENTAFLUOROPROP-TETRAFLUOROETH EX AERO: 1.0000 | INHALATION_SPRAY | CUTANEOUS | Status: DC | PRN

## 2024-09-13 MED ORDER — LIDOCAINE-PRILOCAINE 2.5-2.5 % EX CREA: 1.0000 | TOPICAL_CREAM | CUTANEOUS | Status: DC | PRN

## 2024-09-13 MED ORDER — AMIODARONE HCL 200 MG PO TABS
200.0000 mg | ORAL_TABLET | Freq: Every day | ORAL | Status: DC
  Administered 2024-09-13: 200 mg via ORAL
  Filled 2024-09-13: qty 1

## 2024-09-13 MED ORDER — ALTEPLASE 2 MG IJ SOLR: 2.0000 mg | Freq: Once | INTRAMUSCULAR | Status: DC | PRN

## 2024-09-13 MED ORDER — ANTICOAGULANT SODIUM CITRATE 4% (200MG/5ML) IV SOLN: 5.0000 mL | Status: DC | PRN

## 2024-09-13 MED ORDER — HEPARIN SODIUM (PORCINE) 1000 UNIT/ML DIALYSIS: 1000.0000 [IU] | INTRAMUSCULAR | Status: DC | PRN

## 2024-09-13 MED ORDER — GADOBUTROL 1 MMOL/ML IV SOLN
5.5000 mL | Freq: Once | INTRAVENOUS | Status: AC | PRN
  Administered 2024-09-13: 5.5 mL via INTRAVENOUS

## 2024-09-13 MED ORDER — LIDOCAINE HCL (PF) 1 % IJ SOLN: 5.0000 mL | INTRAMUSCULAR | Status: DC | PRN

## 2024-09-13 MED ORDER — ALTEPLASE 2 MG IJ SOLR
2.0000 mg | Freq: Once | INTRAMUSCULAR | Status: DC | PRN
Start: 2024-09-13 — End: 2024-09-13

## 2024-09-13 MED ORDER — AMIODARONE HCL 200 MG PO TABS
200.0000 mg | ORAL_TABLET | Freq: Two times a day (BID) | ORAL | Status: AC
  Administered 2024-09-14 – 2024-09-19 (×11): 200 mg via ORAL
  Filled 2024-09-13 (×12): qty 1

## 2024-09-13 NOTE — Progress Notes (Signed)
 [facility] Progress Note   Subjective:   Seen in room - he is drowsy, but wakes for questions. He denies CP/dyspnea. Daughter in room - she reports he is not eating/drinking much. Discussed plan is MRI abd today followed by HD today - but that will likely be his last HD.   Objective Vitals:   09/12/24 2044 09/12/24 2345 09/13/24 0514 09/13/24 0741  BP: (!) 112/59 117/71 (!) 118/58   Pulse: 87  94   Resp: 16 18 18 16   Temp: 98 F (36.7 C) 98.2 F (36.8 C) 98.5 F (36.9 C) 99 F (37.2 C)  TempSrc: Oral Axillary Oral Axillary  SpO2: 96% 99% 96% 97%  Weight:      Height:       Physical Exam General: Frail man, NAD. [place] O2 in place Heart: RRR; 2/6 murmur Lungs: CTA anteriorly Abdomen: soft Extremities: no LE edema Dialysis Access: L AVF +t/b  Additional Objective Labs: Basic Metabolic Panel: Recent Labs  Lab 09/09/24 0311 09/10/24 0820 09/11/24 1249 09/13/24 0453  NA 132* 135 134* 133*  K 4.2 4.2 4.1 4.0  CL 92* 95* 94* 92*  CO2 25 25 27 25   GLUCOSE 95 94 147* 94  BUN 25* 54* 48* 85*  CREATININE 4.55* 7.43* 6.02* 8.03*  CALCIUM  7.3* 7.8* 7.6* 7.9*  PHOS 3.0 4.1  --  3.4   Liver Function Tests: Recent Labs  Lab 09/08/24 1603 09/09/24 0311 09/10/24 0820 09/11/24 1249 09/13/24 0453  AST 46* 49*  --  50*  --   ALT 39 37  --  37  --   ALKPHOS 177* 171*  --  298*  --   BILITOT 1.6* 1.4*  --  1.3*  --   PROT 6.6 5.6*  --  5.4*  --   ALBUMIN 2.6* 2.1* 1.8* 1.7* 1.6*    CBC: Recent Labs  Lab 09/08/24 1603 09/08/24 1624 09/10/24 0704 09/11/24 0540 09/11/24 1249 09/12/24 1036 09/13/24 0453  WBC 9.3   < > 12.0* 11.2* 11.4* 12.2* 12.7*  NEUTROABS 7.3  --   --   --   --   --   --   HGB 11.2*   < > 8.4* 8.4* 8.3* 7.9* 8.1*  HCT 33.3*   < > 24.3* 24.1* 23.9* 22.6* 22.9*  MCV 97.7   < > 93.1 92.7 92.6 91.1 89.8  PLT 149*   < > 131* 138* 144* 165 211   < > = values in this interval not displayed.   Blood Culture    Component Value  Date/Time   SDES BLOOD RIGHT ARM 09/10/2024 0704   SPECREQUEST  09/10/2024 0704    BOTTLES DRAWN AEROBIC AND ANAEROBIC Blood Culture results may not be optimal due to an inadequate volume of blood received in culture bottles   CULT  09/10/2024 0704    NO GROWTH 2 DAYS Performed at [facility], [address], [place], [place] [address]    REPTSTATUS PENDING 09/10/2024 9295   Studies/Results: CT ABDOMEN PELVIS W CONTRAST Addendum Date: 09/12/2024 ADDENDUM REPORT: 09/12/2024 07:40 ADDENDUM: Critical Value/emergent results were called by telephone at the time of interpretation on 09/12/2024 at 7:40 am to provider PAULINE DIBIA , who verbally acknowledged these results. Electronically Signed   By: Camellia Candle M.D.   On: 09/12/2024 07:40   Result Date: 09/12/2024 CLINICAL DATA:  Abdominal pain. EXAM: CT ABDOMEN AND PELVIS WITH CONTRAST TECHNIQUE: Multidetector CT imaging of the abdomen and pelvis was performed using  the standard protocol following bolus administration of intravenous contrast. RADIATION DOSE REDUCTION: This exam was performed according to the departmental dose-optimization program which includes automated exposure control, adjustment of the mA and/or kV according to patient size and/or use of iterative reconstruction technique. CONTRAST:  75mL OMNIPAQUE  IOHEXOL  350 MG/ML SOLN COMPARISON:  09/27/2021 FINDINGS: Lower chest: Interval progression of bilateral calcified pleural plaques in the 3 year interval since prior study. Dependent atelectasis/scarring is progressive since prior. Hepatobiliary: Occlusive thrombus noted in the right portal vein with nonocclusive thrombus in the bifurcation of the left and right portal veins in small volume nonocclusive thrombus in the proximal left portal venous anatomy. Pneumobilia suggests prior sphincterotomy. Multiple scattered tiny well-defined hypodensities in the liver parenchyma are too small to characterize but are likely cysts. There are  clustered areas of ill-defined low-density in the periphery of the right hepatic lobe, indeterminate. Gallbladder surgically absent. Pancreas: Gas in the main pancreatic duct likely due to prior sphincterotomy. Spleen: No splenomegaly. No suspicious focal mass lesion. Adrenals/Urinary Tract: No adrenal nodule or mass. Kidneys are atrophic. Multiple low-density renal lesions bilaterally are compatible with cysts of varying size and complexity. 8 mm lesion lower pole right kidney on 45/3 may enhance raising the question of a tiny renal cell carcinoma. No evidence for hydroureter. Bladder wall trabeculation with multiple bladder wall diverticuli suggests underlying component of bladder outlet obstruction. Stomach/Bowel: Moderate distention of the stomach. Wall thickening is noted in the region of the gastric pylorus. Duodenal bulb is patulous. Multiple duodenal diverticuli evident. No small bowel obstruction. The terminal ileum is normal. The appendix is not well visualized, but there is no edema or inflammation in the region of the cecal tip to suggest appendicitis. Trace fluid is noted around the cecum (coronal 47/6). Diffuse diverticular disease noted in the colon with marked wall thickening in the sigmoid segment no overt pericolonic edema inflammation to suggest acute diverticulitis. Vascular/Lymphatic: See above for description of portal vein thrombus. There is advanced atherosclerotic calcification of the abdominal aorta without aneurysm. Upper normal lymph nodes are seen in the hepatoduodenal ligament, likely reactive. No retroperitoneal lymphadenopathy. No pelvic lymphadenopathy. Reproductive: Prostate gland is heterogeneous and generates mass-effect on the bladder base suggesting median lobe hypertrophy. Other: No substantial intraperitoneal free fluid. Musculoskeletal: No worrisome lytic or sclerotic osseous abnormality. IMPRESSION: 1. Occlusive thrombus in the right portal vein and nonocclusive thrombus in  the bifurcation of the left and right portal veins and small volume nonocclusive thrombus in the proximal left portal venous anatomy. Main portal vein is patent. 2. Clustered areas of ill-defined low-density in the periphery of the right hepatic lobe, indeterminate. MRI of the abdomen with and without contrast recommended to further evaluate and exclude underlying infiltrative mass lesion. 3. 8 mm lesion lower pole right kidney may enhance raising the question of a tiny renal cell carcinoma. Attention on follow-up MRI recommended. 4. Diffuse diverticular disease in the colon with marked wall thickening in the sigmoid segment. No overt pericolonic edema inflammation to suggest acute diverticulitis. 5. Bladder wall trabeculation with multiple bladder wall diverticuli suggests underlying component of bladder outlet obstruction. 6. Interval progression of bilateral calcified pleural plaques in the 3 year interval since prior study. 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 7.  Aortic Atherosclerosis (ICD10-I70.0). Electronically Signed: By: Camellia Candle M.D. On: 09/12/2024 07:27   Medications:  cefTRIAXone  (ROCEPHIN )  IV 2 g (09/12/24 1307)    amiodarone  200 mg Oral Daily   apixaban  10 mg Oral BID  Followed by   NOREEN ON 09/19/2024] apixaban  5 mg Oral BID   calcitRIOL  0.25 mcg Oral Q T,Th,Sa-HD   Chlorhexidine  Gluconate Cloth  6 each Topical Q0600   feeding supplement (NEPRO CARB STEADY)  237 mL Oral BID BM   midodrine  5 mg Oral TID WC   multivitamin  1 tablet Oral QHS   Ensure Max Protein  11 oz Oral Daily   rosuvastatin   10 mg Oral Daily   sevelamer carbonate  800 mg Oral TID WC   sodium chloride  flush  3 mL Intravenous Q12H   tamsulosin   0.4 mg Oral Daily    Dialysis Orders TTS - GOC 3:30hr, 350/A1.5, EDW 54kg, 2K/2.5Ca bath, AVF, no heparin  - Mircera 60mcg IV q 2 weeks (last 10/30) - Calcitriol 0.25mcg PO q HD  Assessment/Plan: A-fib with RVR: S/p IV meds, new  onset. Cardiology consulted. On amiodarone + Eliquis. Leukocytosis/fever: COVID/Flu negative. Blood Cx negative. Liver/kidney masses on CT 11/10: For MRI today. ESRD: Usual TTS schedule - for HD today, but likely transitioning to hospice soon per daughter, pending MRI results and discussion with rest of the family. We discussed prognosis and typical progression after stopping HD - all questions answered. HTN/volume: BP soft, but stable. Continue home meds. Anemia of ESRD: Hgb 8.1 - hold on ESA for now. Secondary HPTH: CorrCa ok, Phos ok - continue binders for now. Nutrition: Alb low - continue supplements.   Izetta Boehringer, PA-C 09/13/2024, 9:16 AM  [employer]

## 2024-09-13 NOTE — Progress Notes (Signed)
 Pt receives out-pt HD at [facility] on TTS 11:10 am chair time. Palliative care note reviewed from today. Will provide update to out-pt HD clinic following family meeting planned for tomorrow. Will assist as needed.   Randine Mungo Dialysis Navigator [phone]

## 2024-09-13 NOTE — Progress Notes (Addendum)
 Progress Note  Patient Name: Pedro Young Date of Encounter: 09/13/2024 Pedro Young: Pedro ONEIDA Decent, MD   Interval Summary   Patient shared he is currently asymptomatic.  Did have chest heaviness yesterday afternoon during AF episode.   Vital Signs Vitals:   09/12/24 2044 09/12/24 2345 09/13/24 0514 09/13/24 0741  BP: (!) 112/59 117/71 (!) 118/58   Pulse: 87  94   Resp: 16 18 18 16   Temp: 98 F (36.7 C) 98.2 F (36.8 C) 98.5 F (36.9 C) 99 F (37.2 C)  TempSrc: Oral Axillary Oral Axillary  SpO2: 96% 99% 96% 97%  Weight:      Height:       No intake or output data in the 24 hours ending 09/13/24 1034    09/10/2024   11:39 AM 09/10/2024    8:22 AM 09/10/2024    8:14 AM  Last 3 Weights  Weight (lbs) 123 lb 14.4 oz 127 lb 6.8 oz 127 lb 6.8 oz  Weight (kg) 56.2 kg 57.8 kg 57.8 kg      Telemetry/ECG  Sinus rhythm with PACS HR 88 [episode of AF RVR Hr 140 yesterday afternoon, patient had been in sinus with PACs prior to episode, converted back to sinus with PACS, no conversion pauses - Personally Reviewed  Physical Exam  GEN: No acute distress though sweaty to touch.   Cardiac: RRR, no murmurs, rubs, or gallops. Palpable thrill over and distal to AV fistula, right radial pulse 2+ Respiratory: Clear to auscultation bilaterally. GI: Soft, nontender, non-distended  MS: No edema  Assessment & Plan  Pedro Young is a 89 y.o. male with a hx of CAD, hypertension, BPH, ESRD on HD Tues,Thurs, & Sat, and hyperlipidemia who presented to the ED on 11/6 from dialysis for dizziness and hypotension. EMS on the scene found patient to be AF RVR HR 150 with BP 82/49. He was started on IV amiodarone and cardiology was consulted.   Cardiology converted patient to amiodarone 200 mg BID with dose reduced eliquis. Outpatient arrangement with Dr. Decent 11/17 to continue amiodarone titration. Cardiology signed off via Epic Chat with provider. Patient remained admitted  for ongoing sepsis work-up.   11/9 given concern for infection in the abdomen due to abdominal pain/ N/V; CT abdomen/pelvis was obtained which showed extensive thrombosis to the portal veins with clustered areas of ill defined low density in the periphery of the right hepatic lobe, indeterminate and possible small RCC to right kidney pole. Eliquis was increased to address anticoagulation need for thrombi and amiodarone was stopped with liver lesions.   Palliative care has been consulted, plan to obtain MRI abdomen for further characterization with possible hospice transition.   11/10 patient had an episode of AF RVR HR 14, symptomatic. He was given IV lopressor  5 mg and converted to sinus rhythm with PACs.   11/11 Cardiology was asked to re-round for AF RVR management as patient was tachycardic, amiodarone was re-started.   Atrial Fibrillation with RVR Patient is currently in sinus rhythm  Chad Vasc score 4 Echo showed LVEF 60-65% with RWMA. Mild LVH to the basal-septal segment. Mildly elevated PASP 39.1 mmHg.   Limited option for rate control as cannot use AV nodal blocking agent with hypotension requiring midodrine and cannot use digoxin 2/2 ESRD on HD. Amiodarone started this admission, AST only mildly elevated though new diagnosis of indeterminate liver lesions. I do not think this precludes amiodarone use given lab work though will discuss with MD. Will follow  up on MRI results from today. Unable to pursue TEE/DCCV given paroxysmal nature of AF.    Continue eliquis, AF dosing 2.5 mg BID, currently on increased dose for portal vein thrombi  Continue amiodarone, though with his body mass/liver work-up agree with dose reduction for maintenance. He has not been fully loaded at this point ~ 2g. Would return to amiodarone 200 mg BID for now with titration to possibly 100 mg daily maintenance    CAD Chest pain with AF episodes only.  Defer antiplatelet for chronic anticoagulation as above   Imdur  and metoprolol  on hold 2/2 hypotension   Hyperlipidemia Continue crestor  10 mg. Allergy to other statins.   Hx of hypertension Hypotension  BP: 118/58 Continue midodrine and continue to treat infection  Per primary and nephrology  Mitral Valve Echo showed small echogenic density mostly likely 2/2 MAC though imaging alone could not exclude thrombus/vegetation.  Patient is currently admitted with pneumonia and possible new abdominal cancer [facility] and indeterminate liver lesions with thrombi to portal veins.]  Could consider TEE for further evaluation, however patient is most likely transitioning to hospice care and this would most likely not align with goals.    Per primary  Pneumonia Portal vein thrombus Indeterminate liver lesions Possible RCA BPH ESRD on HD Electrolyte disturbances Hypothyroidism Hypoalbuminemia Anemia   For questions or updates, please contact [facility] Please consult [email or web address] for contact info under       Signed, Leontine LOISE Salen, PA-C    I have personally evaluated and examined the patient. The history, physical exam, and medical decision making documented below were performed independently and substantively by me. I have reviewed all relevant data, formulated the assessment and plan, and assumed responsibility for the management of this patient. My documentation reflects the substantive portion of the split/shared visit, in accordance with CMS and CPT guidelines.  I have personally performed the substantive portion of the medical decision making, including interpretation of diagnostic data, formulation of the management plan, and assessment of risks. In summation:  Pedro Young is an 89 year old with atrial fibrillation who presents with family questions about his care. His family, including daughters, is in the room. Last seen by Dr. Sheena earlier this admission.   He has a history of paroxysmal atrial fibrillation and experienced  atrial fibrillation yesterday afternoon, accompanied by chest heaviness. Today, he is free of chest pain but feels tired.  He has a history of coronary artery disease with collateral blood flow identified in 2021, but he has been asymptomatic. He also has calcific mitral valve disease, mild to moderate tricuspid regurgitation, and mild mitral regurgitation.  He has end-stage renal disease. He also has hypoalbuminemia, liver lesions, and a portal vein thrombus.  His wife has passed away, and a family meeting was held to discuss his transition to comfort care for him with PC.  Exam notable for  Gen: no distress, thin male Respiratory: Normal effort and rate GI: Soft Integument: Skin feels warm Neuro:  At time of evaluation, alert and oriented to person/place/time/situation   A/P    Paroxysmal atrial fibrillation Recent episode with chest heaviness, now resolved. Currently in regular rhythm. On amiodarone and Eliquis, tolerating well. - Continue amiodarone 200 mg PO BID until transition to comfort care - Continue Eliquis 2.5 mg  Coronary artery disease with collateral circulation Coronary artery disease with collateral circulation, no current symptoms. No further echocardiographic testing planned.  Calcific mitral valve disease with mild mitral and tricuspid regurgitation Calcific mitral  valve disease with mild mitral and tricuspid regurgitation, no acute changes.  End stage renal disease End stage renal disease, transitioning to comfort care. Last dialysis session today.  Hypotension Managed with midodrine. Imdur  and metoprolol  held due to hypotension. - Continue midodrine for hypotension - Continue to hold Imdur  and metoprolol   Portal vein thrombosis Portal vein thrombosis. - No changes to current management   Overall I reviewed his care with his family except for grand children who are coming on Saturday and niece, who stays with him but needed to get some rest.  Reviewed  his prior imaging and discussed CAD, AF, and MV disease.  He is not planned for additional testing and is planned to transition to hospice.  We reviewed how this may look from the cardiac perspective.  Family thinks the Niece may have questions for cardiology tomorrow.  I am around tomorrow AM; if there are questions, I will reach out to the niece to discuss.  CC is reasonable  Stanly Leavens, MD FASE [facility]  [address] [place], [address] [place], [place] [address] [phone]  3:23 PM

## 2024-09-13 NOTE — Plan of Care (Signed)

## 2024-09-13 NOTE — Progress Notes (Signed)
   09/13/24 1741  Vitals  Temp 97.9 F (36.6 C)  Pulse Rate (!) 101  Resp (!) 26  BP 139/69  SpO2 100 %  O2 Device Room Air  Post Treatment  Dialyzer Clearance Clear  Liters Processed 71.5  Fluid Removed (mL) 0 mL  Tolerated HD Treatment Yes  AVG/AVF Arterial Site Held (minutes) 10 minutes  AVG/AVF Venous Site Held (minutes) 10 minutes

## 2024-09-13 NOTE — Progress Notes (Signed)
 Pt. Came in on bed with no complaints. Consent signed and on file.  Patient started with no complaints  UF goal: 0ml Tx duration: 3 hours  Access used: Left AVF Access Issue: None  Tx completed and tolerated. Pressure dressing applied. Endorsed to floor nurse. Transported to room.  Huie Ghuman Rubi Jabier Deese, RN [facility]

## 2024-09-13 NOTE — Consult Note (Signed)
 Consultation Note Date: 09/13/2024   Patient Name: Pedro Young  DOB: 1933-09-03  MRN: [id number]  Age / Sex: 89 y.o., male  PCP: Pcp, No Referring Physician: Odell Celinda Balo, MD  Reason for Consultation: Establishing goals of care  HPI/Patient Profile: 89 y.o. male  with past medical history of ESRD on TThS, HLD, HTN, CAD, BPH was admitted on 09/08/2024 with atrial fibrillation with RVR, acute on chronic anemia, hypotension.  Hospital course has been complicated by sepsis secondary to pneumonia and hepatic and renal lesions noted on abdominal CT.   Clinical Assessment and Goals of Care: I have reviewed medical records including EPIC notes and labs. Available advanced directives reviewed including HCPOA and living will - indicates no tube feeding and no life-prolonging treatments. Received report from primary RN -no acute concerns.  Per RN, patient has moderate oral intake.  Went to visit patient at bedside -nursing staff at bedside providing hygiene care/bath. No signs or non-verbal gestures of pain or discomfort noted. No respiratory distress, increased work of breathing, or secretions noted.   Met with granddaughter/[name] and daughter/Xina in private 6E conference room to discuss diagnosis, prognosis, GOC, EOL wishes, disposition, and options.  I introduced Palliative Medicine as specialized medical care for people living with serious illness. It focuses on providing relief from the symptoms and stress of a serious illness. The goal is to improve quality of life for both the patient and the family.  We discussed a brief life review of the patient as well as functional and nutritional status.  Patient is from [place], is widowed, has 5 daughters.  Prior to hospitalization, patient was living with one of his daughters/Nidia in a private residence.  Family indicate that it was going well  and describe him  as a wonderful husband and brother.  Patient like to sing and dance.  We discussed patient's current illness and what it means in the larger context of patient's on-going co-morbidities.  Family have a clear understanding of his current acute medical situation, including hepatic and renal lesion concerning for cancer.  Natural disease trajectory and expectations at EOL were discussed. I attempted to elicit values and goals of care important to the patient. The difference between aggressive medical intervention and comfort care was considered in light of the patient's goals of care.   At this time family are interested in getting MRI results to know how bad it is and where things are.  Per family patient has indicated he wants to go home,  no more pinching,  I am tired.  Family want to respect his wishes.  We discussed hospice care in context of dialysis -in most cases patient would not be able to continue dialysis and enroll in hospice care, but could try to explore if patient/family interested.  Therapeutic listening provided as [name] reflects on information she received from nephrology and the natural trajectory of what stopping dialysis would look like.  Prognosis reviewed to be less than 2 weeks if dialysis were stopped.  Family are understandably tearful.  They reiterate they do not want him to suffer.  Family indicate the patient is incontinent and if he were to return home they would not be able to transport him to and from dialysis.  Per family he would not want his life prolonged in this way.  Therapeutic listening provided as they reflect on his independence and joy for life.  Provided education and counseling at length on the philosophy and benefits of hospice care. Discussed that it  offers a holistic approach to care in the setting of end-stage illness, and is about supporting the patient where they are allowing nature to take it's course. Discussed the hospice team includes RNs,  physicians, social workers, and chaplains. They can provide personal care, support for the family, and help keep patient out of the hospital as well as assist with DME needs for home hospice. Education provided on the difference between home vs residential hospice.  Originally, family were considering home hospice; however, after prognosis reviewed they would be most interested in inpatient hospice facility placement, requesting [facility].  Patient's wife passed away at [facility] and family had a very positive experience.  Advance directives discussed - per family, the documents in patient's EHR are no longer up-to-date.  After these documents were completed patient updated them with a lawyer.  Patient's HCPOA is daughter/Nidia.  Per family, she will be present tomorrow.  Family meeting scheduled for around 9 AM to include HCPOA.  Family will discuss information as outlined today amongst themselves prior to meeting tomorrow.  If patient were to return home with hospice he would return home with granddaughter/[name].  Visit also consisted of discussions dealing with the complex and emotionally intense issues of symptom management and palliative care in the setting of serious and potentially life-threatening illness.   Discussed with family the importance of continued conversation with each other and the medical providers regarding overall plan of care and treatment options, ensuring decisions are within the context of the patient's values and GOCs.    Questions and concerns were addressed. The patient/family was encouraged to call with questions and/or concerns. PMT card was provided.   Primary Decision Maker: HCPOA - per family, HCPOA is [employer], no documentation in EHR    SUMMARY OF RECOMMENDATIONS   Continue gentle medical treatments Continue full code for now Family meeting scheduled for tomorrow 11/12 at 9 AM to finalize plans for inpatient hospice referral and code status No  workup past MRI for hepatic and renal lesions. Family will appreciate updates regarding results when available - please contact [name]  Today will be last HD session PMT will continue to follow and support holistically   Code Status/Advance Care Planning: Full code  Palliative Prophylaxis:  Delirium Protocol, Frequent Pain Assessment, Oral Care, and Turn Reposition  Additional Recommendations (Limitations, Scope, Preferences): No Artificial Feeding and No Chemotherapy  Psycho-social/Spiritual:  Desire for further Chaplaincy support:no Created space and opportunity for patient and family to express thoughts and feelings regarding patient's current medical situation.  Emotional support and therapeutic listening provided.  Prognosis:  < 2 weeks after HD is stopped  Discharge Planning: To Be Determined      Primary Diagnoses: Present on Admission:  Atrial fibrillation with RVR (HCC)  Acute on chronic anemia  HYPERTENSION, BENIGN ESSENTIAL  CAD (coronary artery disease)  Hyperlipidemia  Subclinical hypothyroidism  Anemia of chronic renal failure, stage 4 (severe) (HCC)  Hypotension   I have reviewed the medical record, interviewed the patient and family, and examined the patient. The following aspects are pertinent.  Past Medical History:  Diagnosis Date   Cancer [facility])    ESRD on hemodialysis (HCC)    HD at [facility] on TTS schedule, started June 2024   Hypertension    Social History   Socioeconomic History   Marital status: Married    Spouse name: Not on file   Number of children: Not on file   Years of education: Not on file  Highest education level: Not on file  Occupational History   Not on file  Tobacco Use   Smoking status: Former    Current packs/day: 0.00    Types: Cigarettes    Quit date: 25    Years since quitting: 69.9   Smokeless tobacco: Former    Quit date: 11/03/1954  Vaping Use   Vaping status: Never Used  Substance and Sexual Activity    Alcohol use: No    Alcohol/week: 0.0 standard drinks of alcohol   Drug use: No   Sexual activity: Not on file  Other Topics Concern   Not on file  Social History Narrative   From [place]   Moved to [place] 1968   Moved to [place] 2006   5 daughters   Social Drivers of Corporate Investment Banker Strain: Low Risk  (04/06/2024)   Received from [employer]   Overall Financial Resource Strain (CARDIA)    Difficulty of Paying Living Expenses: Not hard at all  Food Insecurity: No Food Insecurity (09/09/2024)   Hunger Vital Sign    Worried About Running Out of Food in the Last Year: Never true    Ran Out of Food in the Last Year: Never true  Transportation Needs: No Transportation Needs (09/09/2024)   PRAPARE - Administrator, Civil Service (Medical): No    Lack of Transportation (Non-Medical): No  Physical Activity: Insufficiently Active (07/08/2023)   Received from [facility]   Exercise Vital Sign    On average, how many days per week do you engage in moderate to strenuous exercise (like a brisk walk)?: 3 days    On average, how many minutes do you engage in exercise at this level?: 30 min  Stress: No Stress Concern Present (07/08/2023)   Received from [facility] - Occupational Stress Questionnaire    Feeling of Stress : Not at all  Social Connections: Socially Isolated (09/09/2024)   Social Connection and Isolation Panel    Frequency of Communication with Friends and Family: More than three times a week    Frequency of Social Gatherings with Friends and Family: More than three times a week    Attends Religious Services: Never    Database Administrator or Organizations: No    Attends Banker Meetings: Never    Marital Status: Widowed   Family History  Problem Relation Age of Onset   Liver disease Father    Kidney disease Father    Heart disease Father        before age 72   Heart disease Mother        before  age 39   Scheduled Meds:  amiodarone  200 mg Oral Daily   apixaban  10 mg Oral BID   Followed by   NOREEN ON 09/19/2024] apixaban  5 mg Oral BID   calcitRIOL  0.25 mcg Oral Q T,Th,Sa-HD   Chlorhexidine  Gluconate Cloth  6 each Topical Q0600   feeding supplement (NEPRO CARB STEADY)  237 mL Oral BID BM   midodrine  5 mg Oral TID WC   multivitamin  1 tablet Oral QHS   Ensure Max Protein  11 oz Oral Daily   rosuvastatin   10 mg Oral Daily   sevelamer carbonate  800 mg Oral TID WC   sodium chloride  flush  3 mL Intravenous Q12H   tamsulosin   0.4 mg Oral Daily   Continuous Infusions:  cefTRIAXone  (ROCEPHIN )  IV 2 g (09/12/24 1307)   PRN Meds:.acetaminophen  **OR** acetaminophen , fentaNYL  (SUBLIMAZE ) injection, HYDROcodone-acetaminophen , metoprolol  tartrate, ondansetron  **OR** ondansetron  (ZOFRAN ) IV, sodium chloride  flush Medications Prior to Admission:  Prior to Admission medications   Medication Sig Start Date End Date Taking? Authorizing Provider  isosorbide  mononitrate (IMDUR ) 60 MG 24 hr tablet Take 1 tablet (60 mg total) by mouth daily. 08/31/23  Yes   mirtazapine  (REMERON ) 15 MG tablet Take 1 tablet (15 mg total) by mouth daily. 04/25/24  Yes Dolan Mateo Larger, MD  rosuvastatin  (CRESTOR ) 10 MG tablet Take 1 tablet (10 mg total) by mouth daily. 05/12/24  Yes West, Katlyn D, NP  sevelamer (RENAGEL) 800 MG tablet Take 1 tablet by mouth 3 (three) times daily with meals. Three time a day on non dialysis day 07/21/23  Yes [provider]  sodium zirconium cyclosilicate (LOKELMA) 10 g PACK packet Mix and drink 1 powder in packet by mouth daily as directed for high potassium. Patient taking differently: Take 10 g by mouth as directed. Once a day on non dialysis days 02/22/24  Yes   tamsulosin  (FLOMAX ) 0.4 MG CAPS capsule Take 0.4 mg by mouth daily. 06/14/20  Yes [provider]  acetaminophen  (TYLENOL ) 325 MG tablet Take 650 mg by mouth as needed (Dialysis days). Patient not  taking: Reported on 03/25/2024    [provider]  aspirin  EC 81 MG tablet Take 1 tablet (81 mg total) by mouth daily. Swallow whole. Patient not taking: Reported on 03/25/2024 08/31/23   Vicci Rollo SAUNDERS, PA-C  aspirin  EC 81 MG tablet Take 1 tablet (81 mg total) by mouth daily. Patient not taking: Reported on 09/08/2024 08/31/23     colchicine  0.6 MG tablet Take 1 tablet (0.6 mg total) by mouth 2 (two) times daily as needed (gout). Patient not taking: Reported on 03/25/2024 04/21/23   Barbarann Nest, MD  diclofenac Sodium (VOLTAREN) 1 % GEL Apply 2 g topically 4 (four) times daily as needed (joint pain). Patient not taking: Reported on 08/31/2023 08/31/21   [provider]  isosorbide  mononitrate (IMDUR ) 60 MG 24 hr tablet Take 1.5 tablets (90 mg total) by mouth daily. Patient not taking: Reported on 09/08/2024 03/25/24   West, Katlyn D, NP  metoprolol  tartrate (LOPRESSOR ) 25 MG tablet Take 2 tablets (50 mg total) by mouth 2 (two) times daily. Patient taking differently: Take 50 mg by mouth 2 (two) times daily. Once a day 04/21/23   Barbarann Nest, MD  mirtazapine  (REMERON ) 15 MG tablet Take 15 mg by mouth at bedtime. Patient not taking: Reported on 09/08/2024    [provider]  nitroGLYCERIN  (NITROSTAT ) 0.4 MG SL tablet PLACE 1 TABLET UNDER THE TONGUE EVERY 5 MINUTES AS NEEDED FOR CHEST PAIN. Patient not taking: Reported on 03/25/2024 06/22/23   Barbaraann Darryle Ned, MD  rosuvastatin  (CRESTOR ) 10 MG tablet Take 1 tablet (10 mg total) by mouth daily. Patient not taking: Reported on 09/08/2024 05/12/24 08/10/24  West, Katlyn D, NP  rosuvastatin  (CRESTOR ) 40 MG tablet Take 1 tablet (40 mg total) by mouth daily. Patient not taking: Reported on 09/08/2024 08/31/23     tamsulosin  (FLOMAX ) 0.4 MG CAPS capsule Take 1 capsule (0.4 mg total) by mouth daily. Patient not taking: Reported on 09/08/2024 09/11/23      Allergies  Allergen Reactions   Ranexa  [Ranolazine  Er] Other (See  Comments)    Myclonus, hallucinations   Atorvastatin     REACTION: arthralgia  Other Reaction(s): Unknown   Lovastatin  REACTION: arthralgia  Other Reaction(s): Unknown   Amoxicillin-Pot Clavulanate Other (See Comments)    Did it involve swelling of the face/tongue/throat, SOB, or low BP? Unknown Did it involve sudden or severe rash/hives, skin peeling, or any reaction on the inside of your mouth or nose? Unknown Did you need to seek medical attention at a hospital or doctor's office? Unknown When did it last happen? patient is unfamiliar with this allergy   If all above answers are "NO", may proceed with cephalosporin use.    Review of Systems  Unable to perform ROS: Other    Physical Exam Vitals and nursing note reviewed.  Constitutional:      General: He is not in acute distress. Pulmonary:     Effort: No respiratory distress.  Skin:    General: Skin is warm and dry.  Neurological:     Mental Status: He is alert.  Psychiatric:        Behavior: Behavior is cooperative.     Vital Signs: BP (!) 118/58 (BP Location: Right Arm)   Pulse 94   Temp 99 F (37.2 C) (Axillary)   Resp 16   Ht 5' 6 (1.676 m)   Wt 56.2 kg   SpO2 97%   BMI 20.00 kg/m  Pain Scale: 0-10   Pain Score: 0-No pain   SpO2: SpO2: 97 % O2 Device:SpO2: 97 % O2 Flow Rate: .O2 Flow Rate (L/min): 2 L/min  IO: Intake/output summary: No intake or output data in the 24 hours ending 09/13/24 1043  LBM: Last BM Date : 09/12/24 Baseline Weight: Weight: 68 kg Most recent weight: Weight: 56.2 kg     Palliative Assessment/Data:PPS 40%     Time In: 1040 Time Out: 1155 Time Total: 75 mintues  Signed by: Jeoffrey CHRISTELLA Sharps, NP   Please contact Palliative Medicine Team phone at [phone] for questions and concerns.  For individual provider: See Amion  *Portions of this note are a verbal dictation therefore any spelling and/or grammatical errors are due to the Dragon Medical One system  interpretation.

## 2024-09-13 NOTE — Progress Notes (Signed)
 [facility] PROGRESS NOTE    Progress Note  Pedro Young  FMW:[id number] DOB: 06/16/1933 DOA: 09/08/2024 PCP: Pcp, No     Brief Narrative:   Pedro Young is an 89 y.o. male past medical history significant for end-stage renal disease on hemodialysis Tuesday Thursdays and Saturdays, hypertension, BPH comes in for dizziness and palpitation found to be in A-fib with RVR. Cardiology was consulted and recommended IV heparin  and amiodarone.  Converted to sinus rhythm transition to oral amiodarone Eliquis   Assessment/Plan:   Sepsis due to pneumonia White count of 11 tachycardic, CT angio of the chest showed diffuse bronchial wall thickening concerning for bronchopneumonia Procalcitonin elevated at 39.5. Currently on Rocephin  and azithromycin . Has defervesced.  Hepatic and renal lesion: CT scan of the abdomen and pelvis showed occlusive right portal vein thrombosis and nonocclusive thrombus of the left  portal veins.  Right hepatic lobe density.  An 8 mm lesion in the right kidney. MRI of the abdomen to further evaluate infiltrative mass pending. Palliative care has been consulted.  Family would like to to still do the MRI of the abdomen. It seems that their understanding, and would like to move towards hospice care. Patient has had  poor oral intake.  Portal vein thrombosis: Currently on Eliquis.  New onset atrial fibrillation: Resume amiodarone and continue Eliquis. Awaiting cardiology further recommendations.  Acute metabolic encephalopathy: Sleepy and confused  Hypomagnesemia: Try to keep magnesium  greater than 2.  End-stage renal disease on hemodialysis Tuesday Thursdays and Saturdays: Nephrology on board.  Essential hypertension: Antihypertensive medications were held as he was hypotensive started on low-dose midodrine. Blood pressure has remained relatively stable.  CAD: Will ask cardiology we can start aspirin .  Hyperlipidemia:  Continue  Crestor .   DVT prophylaxis: Eliquis Family Communication:granddaughter Status is: Inpatient Remains inpatient appropriate because: A-fib with RVR    Code Status:     Code Status Orders  (From admission, onward)           Start     Ordered   09/09/24 0022  Full code  Continuous       Question:  By:  Answer:  Consent: discussion documented in EHR   09/09/24 0023           Code Status History     Date Active Date Inactive Code Status Order ID Comments User Context   04/18/2023 1715 04/21/2023 2124 DNR [id number]  Waddell Rake, MD ED   04/18/2023 1703 04/18/2023 1715 DNR [id number]  Waddell Rake, MD ED   04/06/2023 2028 04/10/2023 1830 Full Code [id number]  Charlton Evalene RAMAN, MD Inpatient   06/19/2020 1320 06/19/2020 2038 Full Code [id number]  Claudene Victory ORN, MD Inpatient   08/08/2019 1553 08/12/2019 1612 Full Code [id number]  Arlice Reichert, MD ED   08/02/2015 1821 08/09/2015 1438 DNR [id number]  Royal Sill, MD Inpatient         IV Access:   Peripheral IV   Procedures and diagnostic studies:   CT ABDOMEN PELVIS W CONTRAST Addendum Date: 09/12/2024 ADDENDUM REPORT: 09/12/2024 07:40 ADDENDUM: Critical Value/emergent results were called by telephone at the time of interpretation on 09/12/2024 at 7:40 am to provider PAULINE DIBIA , who verbally acknowledged these results. Electronically Signed   By: Camellia Candle M.D.   On: 09/12/2024 07:40   Result Date: 09/12/2024 CLINICAL DATA:  Abdominal pain. EXAM: CT ABDOMEN AND PELVIS WITH CONTRAST TECHNIQUE: Multidetector CT imaging of the abdomen and pelvis was performed using the standard protocol following bolus administration of intravenous  contrast. RADIATION DOSE REDUCTION: This exam was performed according to the departmental dose-optimization program which includes automated exposure control, adjustment of the mA and/or kV according to patient size and/or use of iterative reconstruction technique. CONTRAST:  75mL OMNIPAQUE   IOHEXOL  350 MG/ML SOLN COMPARISON:  09/27/2021 FINDINGS: Lower chest: Interval progression of bilateral calcified pleural plaques in the 3 year interval since prior study. Dependent atelectasis/scarring is progressive since prior. Hepatobiliary: Occlusive thrombus noted in the right portal vein with nonocclusive thrombus in the bifurcation of the left and right portal veins in small volume nonocclusive thrombus in the proximal left portal venous anatomy. Pneumobilia suggests prior sphincterotomy. Multiple scattered tiny well-defined hypodensities in the liver parenchyma are too small to characterize but are likely cysts. There are clustered areas of ill-defined low-density in the periphery of the right hepatic lobe, indeterminate. Gallbladder surgically absent. Pancreas: Gas in the main pancreatic duct likely due to prior sphincterotomy. Spleen: No splenomegaly. No suspicious focal mass lesion. Adrenals/Urinary Tract: No adrenal nodule or mass. Kidneys are atrophic. Multiple low-density renal lesions bilaterally are compatible with cysts of varying size and complexity. 8 mm lesion lower pole right kidney on 45/3 may enhance raising the question of a tiny renal cell carcinoma. No evidence for hydroureter. Bladder wall trabeculation with multiple bladder wall diverticuli suggests underlying component of bladder outlet obstruction. Stomach/Bowel: Moderate distention of the stomach. Wall thickening is noted in the region of the gastric pylorus. Duodenal bulb is patulous. Multiple duodenal diverticuli evident. No small bowel obstruction. The terminal ileum is normal. The appendix is not well visualized, but there is no edema or inflammation in the region of the cecal tip to suggest appendicitis. Trace fluid is noted around the cecum (coronal 47/6). Diffuse diverticular disease noted in the colon with marked wall thickening in the sigmoid segment no overt pericolonic edema inflammation to suggest acute diverticulitis.  Vascular/Lymphatic: See above for description of portal vein thrombus. There is advanced atherosclerotic calcification of the abdominal aorta without aneurysm. Upper normal lymph nodes are seen in the hepatoduodenal ligament, likely reactive. No retroperitoneal lymphadenopathy. No pelvic lymphadenopathy. Reproductive: Prostate gland is heterogeneous and generates mass-effect on the bladder base suggesting median lobe hypertrophy. Other: No substantial intraperitoneal free fluid. Musculoskeletal: No worrisome lytic or sclerotic osseous abnormality. IMPRESSION: 1. Occlusive thrombus in the right portal vein and nonocclusive thrombus in the bifurcation of the left and right portal veins and small volume nonocclusive thrombus in the proximal left portal venous anatomy. Main portal vein is patent. 2. Clustered areas of ill-defined low-density in the periphery of the right hepatic lobe, indeterminate. MRI of the abdomen with and without contrast recommended to further evaluate and exclude underlying infiltrative mass lesion. 3. 8 mm lesion lower pole right kidney may enhance raising the question of a tiny renal cell carcinoma. Attention on follow-up MRI recommended. 4. Diffuse diverticular disease in the colon with marked wall thickening in the sigmoid segment. No overt pericolonic edema inflammation to suggest acute diverticulitis. 5. Bladder wall trabeculation with multiple bladder wall diverticuli suggests underlying component of bladder outlet obstruction. 6. Interval progression of bilateral calcified pleural plaques in the 3 year interval since prior study. 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 7.  Aortic Atherosclerosis (ICD10-I70.0). Electronically Signed: By: Camellia Candle M.D. On: 09/12/2024 07:27     Medical Consultants:   None.   Subjective:    Shondale Quinley no complaints  Objective:    Vitals:   09/12/24 1547 09/12/24 2044 09/12/24 2345 09/13/24 0514  BP: ROLLEN)  104/54  (!) 112/59 117/71 (!) 118/58  Pulse: 87 87  94  Resp:  16 18 18   Temp: 97.9 F (36.6 C) 98 F (36.7 C) 98.2 F (36.8 C) 98.5 F (36.9 C)  TempSrc: Oral Oral Axillary Oral  SpO2: 100% 96% 99% 96%  Weight:      Height:       SpO2: 96 % O2 Flow Rate (L/min): 2 L/min   Intake/Output Summary (Last 24 hours) at 09/13/2024 0645 Last data filed at 09/12/2024 0800 Gross per 24 hour  Intake 75 ml  Output --  Net 75 ml   Filed Weights   09/10/24 0814 09/10/24 0822 09/10/24 1139  Weight: 57.8 kg 57.8 kg 56.2 kg    Exam: General exam: In no acute distress. Respiratory system: Good air movement and clear to auscultation. Cardiovascular system: S1 & S2 heard, RRR. No JVD. Gastrointestinal system: Abdomen is nondistended, soft and nontender.  Central nervous system: Alert and oriented. No focal neurological deficits. Extremities: No pedal edema. Skin: No rashes, lesions or ulcers Psychiatry: Judgment and insight.   Data Reviewed:    Labs: Basic Metabolic Panel: Recent Labs  Lab 09/08/24 1603 09/08/24 1624 09/08/24 1625 09/09/24 0311 09/10/24 0820 09/11/24 1249 09/13/24 0453  NA 138 137 137 132* 135 134* 133*  K 3.1* 3.1* 3.1* 4.2 4.2 4.1 4.0  CL 93* 94*  --  92* 95* 94* 92*  CO2 26  --   --  25 25 27 25   GLUCOSE 88 85  --  95 94 147* 94  BUN 15 16  --  25* 54* 48* 85*  CREATININE 3.38* 3.10*  --  4.55* 7.43* 6.02* 8.03*  CALCIUM  8.2*  --   --  7.3* 7.8* 7.6* 7.9*  MG 1.8  --   --  1.7  --   --   --   PHOS  --   --   --  3.0 4.1  --  3.4   GFR Estimated Creatinine Clearance: 5 mL/min (A) (by C-G formula based on SCr of 8.03 mg/dL (H)). Liver Function Tests: Recent Labs  Lab 09/08/24 1603 09/09/24 0311 09/10/24 0820 09/11/24 1249 09/13/24 0453  AST 46* 49*  --  50*  --   ALT 39 37  --  37  --   ALKPHOS 177* 171*  --  298*  --   BILITOT 1.6* 1.4*  --  1.3*  --   PROT 6.6 5.6*  --  5.4*  --   ALBUMIN 2.6* 2.1* 1.8* 1.7* 1.6*   No results for input(s):  LIPASE, AMYLASE in the last 168 hours. No results for input(s): AMMONIA in the last 168 hours. Coagulation profile No results for input(s): INR, PROTIME in the last 168 hours. COVID-19 Labs  No results for input(s): DDIMER, FERRITIN, LDH, CRP in the last 72 hours.  Lab Results  Component Value Date   SARSCOV2NAA NEGATIVE 09/10/2024   SARSCOV2NAA NEGATIVE 04/06/2023   SARSCOV2NAA NEGATIVE 06/16/2020   SARSCOV2NAA NEGATIVE 08/08/2019    CBC: Recent Labs  Lab 09/08/24 1603 09/08/24 1624 09/10/24 0704 09/11/24 0540 09/11/24 1249 09/12/24 1036 09/13/24 0453  WBC 9.3   < > 12.0* 11.2* 11.4* 12.2* 12.7*  NEUTROABS 7.3  --   --   --   --   --   --   HGB 11.2*   < > 8.4* 8.4* 8.3* 7.9* 8.1*  HCT 33.3*   < > 24.3* 24.1* 23.9* 22.6* 22.9*  MCV 97.7   < >  93.1 92.7 92.6 91.1 89.8  PLT 149*   < > 131* 138* 144* 165 211   < > = values in this interval not displayed.   Cardiac Enzymes: No results for input(s): CKTOTAL, CKMB, CKMBINDEX, TROPONINI in the last 168 hours. BNP (last 3 results) No results for input(s): PROBNP in the last 8760 hours. CBG: Recent Labs  Lab 09/08/24 1601  GLUCAP 83   D-Dimer: No results for input(s): DDIMER in the last 72 hours. Hgb A1c: No results for input(s): HGBA1C in the last 72 hours. Lipid Profile: No results for input(s): CHOL, HDL, LDLCALC, TRIG, CHOLHDL, LDLDIRECT in the last 72 hours. Thyroid function studies: No results for input(s): TSH, T4TOTAL, T3FREE, THYROIDAB in the last 72 hours.  Invalid input(s): FREET3 Anemia work up: No results for input(s): VITAMINB12, FOLATE, FERRITIN, TIBC, IRON, RETICCTPCT in the last 72 hours. Sepsis Labs: Recent Labs  Lab 09/10/24 0704 09/10/24 0930 09/11/24 0540 09/11/24 1249 09/12/24 1036 09/13/24 0453  PROCALCITON  --  39.50  --   --   --   --   WBC 12.0*  --  11.2* 11.4* 12.2* 12.7*  LATICACIDVEN 0.9  --   --   --   --   --     Microbiology Recent Results (from the past 240 hours)  Resp panel by RT-PCR (RSV, Flu A&B, Covid) Anterior Nasal Swab     Status: None   Collection Time: 09/10/24 12:16 AM   Specimen: Anterior Nasal Swab  Result Value Ref Range Status   SARS Coronavirus 2 by RT PCR NEGATIVE NEGATIVE Final   Influenza A by PCR NEGATIVE NEGATIVE Final   Influenza B by PCR NEGATIVE NEGATIVE Final    Comment: (NOTE) The Xpert Xpress SARS-CoV-2/FLU/RSV plus assay is intended as an aid in the diagnosis of influenza from Nasopharyngeal swab specimens and should not be used as a sole basis for treatment. Nasal washings and aspirates are unacceptable for Xpert Xpress SARS-CoV-2/FLU/RSV testing.  Fact Sheet for Patients: bloggercourse.com  Fact Sheet for Healthcare Providers: seriousbroker.it  This test is not yet approved or cleared by the [place]  FDA and has been authorized for detection and/or diagnosis of SARS-CoV-2 by FDA under an Emergency Use Authorization (EUA). This EUA will remain in effect (meaning this test can be used) for the duration of the COVID-19 declaration under Section 564(b)(1) of the Act, 21 U.S.C. section 360bbb-3(b)(1), unless the authorization is terminated or revoked.     Resp Syncytial Virus by PCR NEGATIVE NEGATIVE Final    Comment: (NOTE) Fact Sheet for Patients: bloggercourse.com  Fact Sheet for Healthcare Providers: seriousbroker.it  This test is not yet approved or cleared by the [place]  FDA and has been authorized for detection and/or diagnosis of SARS-CoV-2 by FDA under an Emergency Use Authorization (EUA). This EUA will remain in effect (meaning this test can be used) for the duration of the COVID-19 declaration under Section 564(b)(1) of the Act, 21 U.S.C. section 360bbb-3(b)(1), unless the authorization is terminated or revoked.  Performed at  [facility], [address] [place], [place] [address]   Culture, blood (Routine X 2) w Reflex to ID Panel     Status: None (Preliminary result)   Collection Time: 09/10/24  5:19 AM   Specimen: BLOOD RIGHT HAND  Result Value Ref Range Status   Specimen Description BLOOD RIGHT HAND  Final   Special Requests   Final    BOTTLES DRAWN AEROBIC ONLY Blood Culture adequate volume   Culture  Final    NO GROWTH 2 DAYS Performed at [facility], [address] [place], [place] [address]    Report Status PENDING  Incomplete  Culture, blood (Routine X 2) w Reflex to ID Panel     Status: None (Preliminary result)   Collection Time: 09/10/24  7:04 AM   Specimen: BLOOD RIGHT ARM  Result Value Ref Range Status   Specimen Description BLOOD RIGHT ARM  Final   Special Requests   Final    BOTTLES DRAWN AEROBIC AND ANAEROBIC Blood Culture results may not be optimal due to an inadequate volume of blood received in culture bottles   Culture   Final    NO GROWTH 2 DAYS Performed at [facility], [address] [place], [place] [address]    Report Status PENDING  Incomplete  MRSA Next Gen by PCR, Nasal     Status: None   Collection Time: 09/10/24  2:53 PM   Specimen: Nasal Mucosa; Nasal Swab  Result Value Ref Range Status   MRSA by PCR Next Gen NOT DETECTED NOT DETECTED Final    Comment: (NOTE) The GeneXpert MRSA Assay (FDA approved for NASAL specimens only), is one component of a comprehensive MRSA colonization surveillance program. It is not intended to diagnose MRSA infection nor to guide or monitor treatment for MRSA infections. Test performance is not FDA approved in patients less than 14 years old. Performed at [facility], [address] [place], [place] [address]      Medications:    apixaban  10 mg Oral BID   Followed by   NOREEN ON 09/19/2024] apixaban  5 mg Oral BID   calcitRIOL  0.25 mcg Oral Q T,Th,Sa-HD   Chlorhexidine  Gluconate Cloth  6 each  Topical Q0600   feeding supplement (NEPRO CARB STEADY)  237 mL Oral BID BM   midodrine  5 mg Oral TID WC   multivitamin  1 tablet Oral QHS   Ensure Max Protein  11 oz Oral Daily   rosuvastatin   10 mg Oral Daily   sevelamer carbonate  800 mg Oral TID WC   sodium chloride  flush  3 mL Intravenous Q12H   tamsulosin   0.4 mg Oral Daily   Continuous Infusions:  cefTRIAXone  (ROCEPHIN )  IV 2 g (09/12/24 1307)      LOS: 4 days   Erle Odell Castor  [facility]  09/13/2024, 6:45 AM

## 2024-09-14 DIAGNOSIS — Z7189 Other specified counseling: Secondary | ICD-10-CM | POA: Diagnosis not present

## 2024-09-14 DIAGNOSIS — Z66 Do not resuscitate: Secondary | ICD-10-CM | POA: Diagnosis not present

## 2024-09-14 DIAGNOSIS — I4891 Unspecified atrial fibrillation: Secondary | ICD-10-CM | POA: Diagnosis not present

## 2024-09-14 DIAGNOSIS — Z515 Encounter for palliative care: Secondary | ICD-10-CM | POA: Diagnosis not present

## 2024-09-14 MED ORDER — HALOPERIDOL 1 MG PO TABS: 2.0000 mg | ORAL_TABLET | Freq: Four times a day (QID) | ORAL | Status: DC | PRN

## 2024-09-14 MED ORDER — GLYCOPYRROLATE 1 MG PO TABS
1.0000 mg | ORAL_TABLET | ORAL | Status: DC | PRN
Start: 2024-09-14 — End: 2024-09-23

## 2024-09-14 MED ORDER — DIPHENHYDRAMINE HCL 50 MG/ML IJ SOLN: 25.0000 mg | INTRAMUSCULAR | Status: DC | PRN

## 2024-09-14 MED ORDER — HALOPERIDOL LACTATE 5 MG/ML IJ SOLN
2.0000 mg | Freq: Four times a day (QID) | INTRAMUSCULAR | Status: DC | PRN
Start: 2024-09-14 — End: 2024-09-19

## 2024-09-14 MED ORDER — HYDROMORPHONE HCL 1 MG/ML IJ SOLN
0.5000 mg | INTRAMUSCULAR | Status: DC | PRN
  Administered 2024-09-16: 1 mg via INTRAVENOUS
  Filled 2024-09-14: qty 1

## 2024-09-14 MED ORDER — LORAZEPAM 2 MG/ML IJ SOLN: 1.0000 mg | INTRAMUSCULAR | Status: DC | PRN

## 2024-09-14 MED ORDER — GLYCOPYRROLATE 0.2 MG/ML IJ SOLN: 0.2000 mg | INTRAMUSCULAR | Status: DC | PRN

## 2024-09-14 MED ORDER — HALOPERIDOL LACTATE 2 MG/ML PO CONC: 2.0000 mg | Freq: Four times a day (QID) | ORAL | Status: DC | PRN

## 2024-09-14 MED ORDER — BIOTENE DRY MOUTH MT LIQD
15.0000 mL | Freq: Two times a day (BID) | OROMUCOSAL | Status: DC
  Administered 2024-09-14 – 2024-09-23 (×13): 15 mL via TOPICAL

## 2024-09-14 MED ORDER — LORAZEPAM 2 MG/ML PO CONC: 1.0000 mg | ORAL | Status: DC | PRN

## 2024-09-14 MED ORDER — POLYVINYL ALCOHOL 1.4 % OP SOLN: 1.0000 [drp] | Freq: Four times a day (QID) | OPHTHALMIC | Status: DC | PRN

## 2024-09-14 MED ORDER — LORAZEPAM 1 MG PO TABS: 1.0000 mg | ORAL_TABLET | ORAL | Status: DC | PRN

## 2024-09-14 MED ORDER — NEPRO/CARBSTEADY PO LIQD: 237.0000 mL | ORAL | Status: DC | PRN

## 2024-09-14 NOTE — Plan of Care (Signed)
   Problem: Education: Goal: Knowledge of General Education information will improve Description Including pain rating scale, medication(s)/side effects and non-pharmacologic comfort measures Outcome: Progressing

## 2024-09-14 NOTE — Progress Notes (Signed)
 Occupational Therapy Treatment Patient Details Name: Pedro Young MRN: [id number] DOB: 03/15/1933 Today's Date: 09/14/2024   History of present illness 89 yo male presents to ED 09/08/24 from HD with dizziness and hypotension. Pt  in Afib with RVR upon arrival. PMHx: CAD, BPH, HLD, HTN, preDM, ESRD on HD TTS   OT comments  Pt readily willing to work with therapies. Moderate assistance for bed mobility and +2 min-mod assist to transfer to recliner. Pt completed grooming in sitting at sink with min assist, donned fresh gown with mod assist and required total assist for pericare. RN made aware, pt with blood from rectum. HR better controlled than previous session, mid 90s. Pt reports he is tired and want to be out of the hospital, too many people. Noted family is meeting with palliative care team this morning.      If plan is discharge home, recommend the following:  A lot of help with walking and/or transfers;A lot of help with bathing/dressing/bathroom;Assistance with cooking/housework;Direct supervision/assist for medications management;Direct supervision/assist for financial management;Assist for transportation;Help with stairs or ramp for entrance   Equipment Recommendations  Hospital bed    Recommendations for Other Services      Precautions / Restrictions Precautions Precautions: Fall Recall of Precautions/Restrictions: Impaired Precaution/Restrictions Comments: watch HR Restrictions Weight Bearing Restrictions Per Provider Order: No       Mobility Bed Mobility Overal bed mobility: Needs Assistance Bed Mobility: Supine to Sit     Supine to sit: Mod assist, HOB elevated     General bed mobility comments: increased time, minA to LE and modA to trunk, maintains posterior lean. assist to scoot with bed pad    Transfers Overall transfer level: Needs assistance Equipment used: 2 person hand held assist Transfers: Sit to/from Stand, Bed to chair/wheelchair/BSC Sit to  Stand: +2 physical assistance, Mod assist     Step pivot transfers: Min assist, +2 physical assistance           Balance Overall balance assessment: Needs assistance   Sitting balance-Leahy Scale: Poor Sitting balance - Comments: modA to CGA due to posterior lean   Standing balance support: Bilateral upper extremity supported Standing balance-Leahy Scale: Poor                             ADL either performed or assessed with clinical judgement   ADL Overall ADL's : Needs assistance/impaired     Grooming: Wash/dry hands;Wash/dry face;Oral care;Minimal assistance;Sitting Grooming Details (indicate cue type and reason): at sink in recliner         Upper Body Dressing : Moderate assistance;Sitting           Toileting- Clothing Manipulation and Hygiene: Total assistance;Sit to/from stand;+2 for physical assistance              Extremity/Trunk Assessment              Vision       Perception     Praxis     Communication Communication Communication: Impaired Factors Affecting Communication: Hearing impaired;Other (comment) (low volume voice)   Cognition Arousal: Alert Behavior During Therapy: Flat affect Cognition: Cognition impaired       Memory impairment (select all impairments): Short-term memory   Executive functioning impairment (select all impairments): Sequencing, Initiation, Problem solving OT - Cognition Comments: slow processing speed                 Following commands: Impaired Following commands impaired: Follows  one step commands with increased time      Cueing   Cueing Techniques: Verbal cues, Tactile cues, Gestural cues  Exercises      Shoulder Instructions       General Comments HR to 95bpm, BP soft but stable, MAP >60, SpO2 95% on RA. Pt with blood in stool noted during clean up    Pertinent Vitals/ Pain       Pain Assessment Pain Assessment: No/denies pain  Home Living                                           Prior Functioning/Environment              Frequency  Min 2X/week        Progress Toward Goals  OT Goals(current goals can now be found in the care plan section)     Acute Rehab OT Goals OT Goal Formulation: With patient Time For Goal Achievement: 09/23/24 Potential to Achieve Goals: Good  Plan      Co-evaluation    PT/OT/SLP Co-Evaluation/Treatment: Yes Reason for Co-Treatment: Complexity of the patient's impairments (multi-system involvement) PT goals addressed during session: Mobility/safety with mobility;Balance;Proper use of DME;Strengthening/ROM OT goals addressed during session: ADL's and self-care      AM-PAC OT 6 Clicks Daily Activity     Outcome Measure                    End of Session Equipment Utilized During Treatment: Gait belt  OT Visit Diagnosis: Unsteadiness on feet (R26.81);Other abnormalities of gait and mobility (R26.89);Muscle weakness (generalized) (M62.81);Other symptoms and signs involving cognitive function   Activity Tolerance Patient limited by fatigue   Patient Left in chair;with call bell/phone within reach;with family/visitor present   Nurse Communication          Time: 9190-9164 OT Time Calculation (min): 26 min  Charges: OT General Charges $OT Visit: 1 Visit OT Treatments $Self Care/Home Management : 8-22 mins  Pedro Young, OTR/L Acute Rehabilitation Services Office: [phone]   Pedro Young 09/14/2024, 9:11 AM

## 2024-09-14 NOTE — Plan of Care (Signed)

## 2024-09-14 NOTE — Progress Notes (Signed)
 Physical Therapy Treatment Patient Details Name: Pedro Young MRN: [id number] DOB: 1933/03/28 Today's Date: 09/14/2024   History of Present Illness 89 yo male presents to ED 09/08/24 from HD with dizziness and hypotension. Pt  in Afib with RVR upon arrival. PMHx: CAD, BPH, HLD, HTN, preDM, ESRD on HD TTS    PT Comments  The pt was agreeable to limited session with focus on functional transfers and seated balance. Pt needing increased assist for bed mobility, and increased assist for seated balance this session due to more prominent posterior lean needing up to modA to maintain static sitting. Pt was able to improve with cues and attention, but limited ability to maintain due to fatigue. Pt also needing slightly increased assist for transfers with use of HHA vs RW in last session, suspect he could complete with min-modA of 1 with use of RW. Pt completed self-care activities in seated position with intermittent balance assist but HR stable <95bpm with all activity. Gait deferred this session due to pt reports of fatigue and plans for family meeting later this morning. Could benefit from continued skilled PT to maintain strength and maximal independence with transfers as it aligns with goals of care.     If plan is discharge home, recommend the following: A lot of help with bathing/dressing/bathroom;Assistance with cooking/housework;Assist for transportation;Help with stairs or ramp for entrance;Two people to help with walking and/or transfers   Can travel by private vehicle     Yes  Equipment Recommendations  None recommended by PT    Recommendations for Other Services       Precautions / Restrictions Precautions Precautions: Fall Recall of Precautions/Restrictions: Impaired Precaution/Restrictions Comments: watch HR Restrictions Weight Bearing Restrictions Per Provider Order: No     Mobility  Bed Mobility Overal bed mobility: Needs Assistance Bed Mobility: Supine to Sit      Supine to sit: Mod assist, HOB elevated     General bed mobility comments: increased time, minA to LE and modA to trunk, maintains posterior lean. assist to scoot with bed pad    Transfers Overall transfer level: Needs assistance Equipment used: 2 person hand held assist Transfers: Sit to/from Stand, Bed to chair/wheelchair/BSC Sit to Stand: +2 physical assistance, Mod assist   Step pivot transfers: Min assist, +2 physical assistance       General transfer comment: modA of 2 to rise with HHA, minA of 2 to manage pivotal steps from EOB to chair, could be mod of 1 and min of 1 respectively with BUE support on RW    Ambulation/Gait               General Gait Details: limited to small pivotal steps today due to pt fatigue, would need RW and chair follow     Balance Overall balance assessment: Needs assistance Sitting-balance support: Feet supported, No upper extremity supported Sitting balance-Leahy Scale: Fair Sitting balance - Comments: modA to CGA due to posterior lean Postural control: Posterior lean Standing balance support: Bilateral upper extremity supported Standing balance-Leahy Scale: Poor Standing balance comment: BUE support and minA                            Communication Communication Communication: Impaired Factors Affecting Communication: Hearing impaired;Other (comment) (minimally verbal)  Cognition Arousal: Alert Behavior During Therapy: Flat affect   PT - Cognitive impairments: No apparent impairments  PT - Cognition Comments: limited assessment due to flat affect, pt following commands in session with increased time. Following commands: Impaired Following commands impaired: Follows one step commands with increased time    Cueing Cueing Techniques: Verbal cues, Tactile cues, Gestural cues     General Comments General comments (skin integrity, edema, etc.): HR to 95bpm, BP soft but stable, MAP >60,  SpO2 95% on RA. Pt with blood in stool noted during clean up, RN present and aware.      Pertinent Vitals/Pain Pain Assessment Pain Assessment: No/denies pain Pain Intervention(s): Monitored during session     PT Goals (current goals can now be found in the care plan section) Acute Rehab PT Goals Patient Stated Goal: to go home PT Goal Formulation: With patient Time For Goal Achievement: 09/24/24 Potential to Achieve Goals: Fair Progress towards PT goals: Not progressing toward goals - comment (limited this session by fatigue)    Frequency    Min 2X/week      PT Plan      Co-evaluation PT/OT/SLP Co-Evaluation/Treatment: Yes Reason for Co-Treatment: Complexity of the patient's impairments (multi-system involvement);For patient/therapist safety;To address functional/ADL transfers PT goals addressed during session: Mobility/safety with mobility;Balance;Proper use of DME;Strengthening/ROM OT goals addressed during session: ADL's and self-care      AM-PAC PT 6 Clicks Mobility   Outcome Measure  Help needed turning from your back to your side while in a flat bed without using bedrails?: A Little Help needed moving from lying on your back to sitting on the side of a flat bed without using bedrails?: A Little Help needed moving to and from a bed to a chair (including a wheelchair)?: A Lot Help needed standing up from a chair using your arms (e.g., wheelchair or bedside chair)?: A Lot Help needed to walk in hospital room?: Total (<20 ft) Help needed climbing 3-5 steps with a railing? : Total 6 Click Score: 12    End of Session Equipment Utilized During Treatment: Gait belt Activity Tolerance: Patient limited by fatigue Patient left: with call bell/phone within reach;with family/visitor present;in chair Nurse Communication: Mobility status PT Visit Diagnosis: Muscle weakness (generalized) (M62.81)     Time: 9190-9165 PT Time Calculation (min) (ACUTE ONLY): 25  min  Charges:    $Therapeutic Activity: 8-22 mins PT General Charges $$ ACUTE PT VISIT: 1 Visit                     Izetta Call, PT, DPT   Acute Rehabilitation Department Office [phone] Secure Chat Communication Preferred   Izetta JULIANNA Call 09/14/2024, 8:51 AM

## 2024-09-14 NOTE — Progress Notes (Signed)
 Informed by palliative team that Pedro Young is transitioning to full comfort care/hospice. No further dialysis.  It has been a pleasure being involved with his care over the years.  Izetta Boehringer, PA-C [employer] Pager [phone]

## 2024-09-14 NOTE — TOC Progression Note (Addendum)
 Transition of Care [facility]) - Progression Note    Patient Details  Name: Monroe Qin MRN: [id number] Date of Birth: 05/07/1933  Transition of Care [facility]) CM/SW Contact  [name] Public, LCSWA Phone Number: 09/14/2024, 10:34 AM  Clinical Narrative:      CSW received consult from [facility] Palliative NP patients family requesting [facility] for patient. CSW LVM for patients daughter Inocente. CSW awaiting call back.  Update- CSW spoke with patients daughter [name] who gave CSW permission to make referral to [facility] for [facility] place for patient. CSW made referral to [facility] with [facility] for [facility] place for patient. [name] confirmed she will be first point of contact for patient. CSW will continue to follow.  Update- [name] with [facility] informed Team that [facility] does not have a routine bed at the moment. [name] with [facility] plans on following up tomorrow morning.                 Expected Discharge Plan and Services                                               Social Drivers of Health (SDOH) Interventions SDOH Screenings   Food Insecurity: No Food Insecurity (09/09/2024)  Housing: Low Risk  (09/09/2024)  Transportation Needs: No Transportation Needs (09/09/2024)  Utilities: Not At Risk (09/09/2024)  Depression (PHQ2-9): Low Risk  (06/01/2023)  Financial Resource Strain: Low Risk  (04/06/2024)   Received from [facility]  Physical Activity: Insufficiently Active (07/08/2023)   Received from [facility]  Social Connections: Socially Isolated (09/09/2024)  Stress: No Stress Concern Present (07/08/2023)   Received from [facility]  Tobacco Use: Medium Risk (09/08/2024)    Readmission Risk Interventions    04/20/2023    2:37 PM 04/08/2023   10:49 AM  Readmission Risk Prevention Plan  Transportation Screening Complete Complete  PCP or Specialist Appt within 5-7 Days  Complete  PCP or Specialist Appt within 3-5 Days Complete   Home Care Screening   Complete  Medication Review (RN CM)  Complete  HRI or Home Care Consult Complete   Social Work Consult for Recovery Care Planning/Counseling Complete   Palliative Care Screening Complete   Medication Review Oceanographer) Referral to Pharmacy

## 2024-09-14 NOTE — Progress Notes (Signed)
 Patient seen and examined, briefly Pedro Young is a 89/M with ESRD, BPH, admitted with fever, A-fib RVR, weakness and failure to thrive. - Treated for bronchitis, elevated procalcitonin with ceftriaxone  and azithromycin  -Noted to have portal vein thrombosis on imaging and questionable lesions in the liver and kidneys -Hospital course complicated by failure to thrive, metabolic encephalopathy, ongoing decline -Dr. Celinda and cardiology team consulted palliative care -Family meeting completed this morning, plan for comfort focused care, residential hospice  Pedro Pac, MD

## 2024-09-14 NOTE — Progress Notes (Signed)
 Daily Progress Note   Patient Name: Pedro Young       Date: 09/14/2024 DOB: 1932/12/10  Age: 89 y.o. MRN#: [id number] Attending Physician: Fairy Frames, MD Primary Care Physician: Pcp, No Admit Date: 09/08/2024  Reason for Consultation/Follow-up: Establishing goals of care  Subjective: I have reviewed medical records including EPIC notes: Cardiology, nephrology, PT/OT, nursing, hospitalist; [facility]; and labs including creatinine of 8.03 to assess kidney function for opioid prescribing and prognostication as well as albumin of 1.6 assessed for overall health, nutritional status, and disease severity, helping predict prognosis and guide care.  Received report from primary RN -no acute concerns.  Went to visit patient at bedside-6 family members present, including 3 daughters Maudry, Gaffer, another daughter) and 3 grandchildren, including [name].  Patient is sitting up in chair awake but does not engage in group discussions.  He does quietly speak to family members. No signs or non-verbal gestures of pain or discomfort noted. No respiratory distress, increased work of breathing, or secretions noted.  He is frail and ill-appearing.  --------------------------------------------------------------------------------------------  Advance Care Planning Conversation   Pertinent diagnoses: A-fib with RVR, hepatic and renal lesions, acute on chronic anemia, sepsis secondary to pneumonia  The patient and/or family consented to a voluntary Advance Care Planning conversation in person. Individuals present for the conversation:  Patient, 3 daughters, 3 granddaughters  Summary of the conversation:    Emotional support provided to family.  They did have the opportunity to discuss patient's  overall clinical status last night and what they are hopeful for going forward.  Family understand patient's current acute medical situation and have also discussed patient's goals and wishes with him.  Family tell me patient states he  is tired.  He does not want any more pinching.   Therapeutic listening provided his family described patient's slow decline and how he was out of it after dialysis yesterday.  They want to support patient's wishes to no longer pursue dialysis and to focus on his comfort and quality at this time.  Family are understandably tearful, and indicate that they do not want him to suffer.   Natural disease trajectory and expectations at EOL were discussed for someone no longer pursuing dialysis treatments.  Prognosis reviewed.  Again provided education and counseling at length on the philosophy and benefits of hospice care. Discussed that  it offers a holistic approach to care in the setting of end-stage illness, and is about supporting the patient where they are allowing nature to take it's course. Discussed the hospice team includes RNs, physicians, social workers, and chaplains. They can provide personal care, support for the family, and help keep patient out of the hospital as well as assist with DME needs for home hospice. Education provided on the difference between home vs residential hospice.  Family expressed again that originally, they were considering home hospice; however, after prognosis reviewed they would be most interested in inpatient hospice facility placement, requesting [facility].  Patient's wife passed away at [facility] and family had a very positive experience.  All family present are agreeable to patient's transfer to [facility].  We talked about transition to comfort measures in house and what that would entail inclusive of medications to control pain, dyspnea, agitation, nausea, and itching. We discussed stopping all unnecessary measures such as  blood draws, needle sticks, oxygen, antibiotics, CBGs/insulin, cardiac monitoring, IVF, and frequent vital signs. Education provided that other non-pharmacological interventions would be utilized for holistic support and comfort such as spiritual support if requested, repositioning, music therapy, offering comfort feeds, and/or therapeutic listening. All care would focus on how the patient is looking and feeling.  Family opted for patient's transition to full comfort care today.   Visit also consisted of discussions dealing with the complex and emotionally intense issues of symptom management and palliative care in the setting of serious and potentially life-threatening illness.  Symptom management plan reviewed in detail.  Family indicate that patient does not want attempts at resuscitation.  They opt for CODE STATUS change to DNR/DNI to allow for peaceful natural passing.  Outcome of the conversation: -Inpatient hospice referral - Full comfort measures today   - Code status change  I spent 60 minutes providing separately identifiable ACP services with the patient and/or surrogate decision maker in a voluntary, in-person conversation discussing the patient's wishes and goals as detailed in the above note.   Length of Stay: 5  Current Medications: Scheduled Meds:   amiodarone  200 mg Oral BID   apixaban  10 mg Oral BID   Followed by   NOREEN ON 09/19/2024] apixaban  5 mg Oral BID   calcitRIOL  0.25 mcg Oral Q T,Th,Sa-HD   Chlorhexidine  Gluconate Cloth  6 each Topical Q0600   feeding supplement (NEPRO CARB STEADY)  237 mL Oral BID BM   midodrine  5 mg Oral TID WC   multivitamin  1 tablet Oral QHS   Ensure Max Protein  11 oz Oral Daily   rosuvastatin   10 mg Oral Daily   sevelamer carbonate  800 mg Oral TID WC   sodium chloride  flush  3 mL Intravenous Q12H   tamsulosin   0.4 mg Oral Daily    Continuous Infusions:  cefTRIAXone  (ROCEPHIN )  IV 2 g (09/13/24 1307)    PRN  Meds: acetaminophen  **OR** acetaminophen , fentaNYL  (SUBLIMAZE ) injection, HYDROcodone-acetaminophen , metoprolol  tartrate, ondansetron  **OR** ondansetron  (ZOFRAN ) IV, sodium chloride  flush  Physical Exam Vitals and nursing note reviewed.  Constitutional:      General: He is not in acute distress.    Appearance: He is ill-appearing.     Comments: frail  Pulmonary:     Effort: No respiratory distress.  Skin:    General: Skin is warm and dry.  Neurological:     Mental Status: He is alert.     Motor: Weakness present.  Psychiatric:  Behavior: Behavior is cooperative.             Vital Signs: BP (!) 102/51 (BP Location: Right Arm)   Pulse 80   Temp 98.1 F (36.7 C) (Oral)   Resp 20   Ht 5' 6 (1.676 m)   Wt 56 kg   SpO2 97%   BMI 19.93 kg/m  SpO2: SpO2: 97 % O2 Device: O2 Device: Room Air O2 Flow Rate: O2 Flow Rate (L/min): 2 L/min  Intake/output summary:  Intake/Output Summary (Last 24 hours) at 09/14/2024 0859 Last data filed at 09/13/2024 1741 Gross per 24 hour  Intake --  Output 0 ml  Net 0 ml   LBM: Last BM Date : 09/13/24 Baseline Weight: Weight: 68 kg Most recent weight: Weight: 56 kg       Palliative Assessment/Data: PPS 50%      Patient Active Problem List   Diagnosis Date Noted   Hypotension 09/09/2024   Atrial fibrillation with RVR (HCC) 09/08/2024   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   ESRD on dialysis (HCC) 04/18/2023   BPH (benign prostatic hyperplasia) 04/18/2023   Acute on chronic anemia 04/18/2023   Protein-calorie malnutrition, severe 04/07/2023   Hyponatremia 04/06/2023   CAD (coronary artery disease) 04/06/2023   Metabolic acidosis 04/06/2023   Subclinical hypothyroidism 05/25/2022   Angina pectoris 06/19/2020   Hyperlipidemia 05/16/2013   Anemia of chronic renal failure, stage 4 (severe) (HCC) 01/18/2010   Liver abscess via hepatic artery 2011, 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 male  with past medical history of ESRD on TThS, HLD, HTN, CAD, BPH was admitted on 09/08/2024 with atrial fibrillation with RVR, acute on chronic anemia, hypotension.  Hospital course has been complicated by sepsis secondary to pneumonia and hepatic and renal lesions noted on abdominal CT.   Assessment: Principal Problem:   Atrial fibrillation with RVR (HCC) Active Problems:   Anemia of chronic renal failure, stage 4 (severe) (HCC)   HYPERTENSION, BENIGN ESSENTIAL   CAD (coronary artery disease)   ESRD on dialysis (HCC)   Hyperlipidemia   Subclinical hypothyroidism   Acute on chronic anemia   Hypotension   Terminal care  Recommendations/Plan: Initiated full comfort measures Now DNR/DNI - durable DNR form completed and placed in shadow chart. Copy was made and will be scanned into Vynca/ACP tab Patient/family interested in inpatient hospice transfer, requesting [facility] - TOC consult placed; TOC and hospice liaison notified No further HD  Added orders for EOL symptom management and to reflect full comfort measures, as well as discontinued orders that were not focused on comfort Unrestricted visitation orders were placed per current [place] EOL visitation policy  Nursing to provide frequent assessments and administer PRN medications as clinically necessary to ensure EOL comfort PMT will continue to follow and support holistically  Symptom Management Dilaudid PRN pain/dyspnea/increased work of breathing/RR>25 Norco PRN moderate pain Tylenol  PRN pain/fever Biotin twice daily Benadryl PRN itching Robinul PRN secretions Haldol  PRN agitation/delirium Ativan PRN anxiety/seizure/sleep/distress Zofran  PRN  nausea/vomiting Liquifilm Tears PRN dry eye Continue amiodarone, metoprolol , and Flomax  while tolerating p.o.'s    Goals of Care and Additional Recommendations: Limitations on Scope of Treatment: Full Comfort Care  Code Status:  Code Status Orders  (From admission, onward)           Start     Ordered   09/09/24 0022  Full code  Continuous       Question:  By:  Answer:  Consent: discussion documented in EHR   09/09/24 0023           Code Status History     Date Active Date Inactive Code Status Order ID Comments User Context   04/18/2023 1715 04/21/2023 2124 DNR [id number]  Waddell Rake, MD ED   04/18/2023 1703 04/18/2023 1715 DNR [id number]  Waddell Rake, MD ED   04/06/2023 2028 04/10/2023 1830 Full Code [id number]  Charlton Evalene RAMAN, MD Inpatient   06/19/2020 1320 06/19/2020 2038 Full Code [id number]  Claudene Victory ORN, MD Inpatient   08/08/2019 1553 08/12/2019 1612 Full Code [id number]  Arlice Reichert, MD ED   08/02/2015 1821 08/09/2015 1438 DNR [id number]  Royal Sill, MD Inpatient       Prognosis:  < 2 weeks  Discharge Planning: Hospice facility  Care plan was discussed with primary RN, Dr. Fairy, [facility], hospice liaison, patient, patient's family  Thank you for allowing the Palliative Medicine Team to assist in the care of this patient.     Jeoffrey CHRISTELLA Claudene, NP  Please contact Palliative Medicine Team phone at [phone] for questions and concerns.   *Portions of this note are a verbal dictation therefore any spelling and/or grammatical errors are due to the Dragon Medical One system interpretation.

## 2024-09-14 NOTE — Progress Notes (Signed)
 Nutrition Brief Note  Chart reviewed. Pt now transitioning to comfort care. Spoke to family at bedside, encouraged to continue providing supplements as needed and RD will make note in Health Touch to send ice cream with each tray. Diet changed to regular.  No further nutrition interventions planned at this time.  Please re-consult as needed.   Brinlynn Gorton, MS, RD, LDN Clinical Dietitian  Contact via secure chat. If unavailable, use group chat RD Inpatient.

## 2024-09-14 NOTE — Progress Notes (Signed)
 Pt has been transitioned to comfort care. Contacted Geronimo Car to be made aware of this information and that pt will not resume care at d/c.   Randine Mungo Dialysis Navigator [phone]

## 2024-09-15 DIAGNOSIS — Z515 Encounter for palliative care: Secondary | ICD-10-CM | POA: Diagnosis not present

## 2024-09-15 DIAGNOSIS — Z7189 Other specified counseling: Secondary | ICD-10-CM | POA: Diagnosis not present

## 2024-09-15 DIAGNOSIS — R638 Other symptoms and signs concerning food and fluid intake: Secondary | ICD-10-CM

## 2024-09-15 DIAGNOSIS — R52 Pain, unspecified: Secondary | ICD-10-CM

## 2024-09-15 DIAGNOSIS — I4891 Unspecified atrial fibrillation: Secondary | ICD-10-CM | POA: Diagnosis not present

## 2024-09-15 DIAGNOSIS — Z66 Do not resuscitate: Secondary | ICD-10-CM | POA: Diagnosis not present

## 2024-09-15 LAB — CULTURE, BLOOD (ROUTINE X 2)
Culture: NO GROWTH
Culture: NO GROWTH
Special Requests: ADEQUATE

## 2024-09-15 MED ORDER — ACETAMINOPHEN 325 MG PO TABS: 650.0000 mg | ORAL_TABLET | Freq: Four times a day (QID) | ORAL | Status: AC | PRN

## 2024-09-15 MED ORDER — HYDROCODONE-ACETAMINOPHEN 5-325 MG PO TABS: 1.0000 | ORAL_TABLET | ORAL | Status: DC | PRN

## 2024-09-15 NOTE — TOC Progression Note (Addendum)
 Transition of Care [facility]) - Progression Note    Patient Details  Name: Pedro Young MRN: [id number] Date of Birth: [birth year]  Transition of Care [facility]) CM/SW Contact  [name] Public, LCSWA Phone Number: 09/15/2024, 1:13 PM  Clinical Narrative:      [name] with [facility] informed CSW patient was approved for [facility] place but there is no bed availability today. [name] informed CSW that she checked bed availability at [facility] location as well and they do not currently have a bed today. CSW updated patients daughter [name]. [name] thanked CSW for update.[name] informed CSW that number one choice for patient is [facility]. CSW informed [name] with [facility].                    Expected Discharge Plan and Services                                               Social Drivers of Health (SDOH) Interventions SDOH Screenings   Food Insecurity: No Food Insecurity (09/09/2024)  Housing: Low Risk  (09/09/2024)  Transportation Needs: No Transportation Needs (09/09/2024)  Utilities: Not At Risk (09/09/2024)  Depression (PHQ2-9): Low Risk  (06/01/2023)  Financial Resource Strain: Low Risk  (04/06/2024)   Received from [facility]  Physical Activity: Insufficiently Active (07/08/2023)   Received from [facility]  Social Connections: Socially Isolated (09/09/2024)  Stress: No Stress Concern Present (07/08/2023)   Received from [facility]  Tobacco Use: Medium Risk (09/08/2024)    Readmission Risk Interventions    04/20/2023    2:37 PM 04/08/2023   10:49 AM  Readmission Risk Prevention Plan  Transportation Screening Complete Complete  PCP or Specialist Appt within 5-7 Days  Complete  PCP or Specialist Appt within 3-5 Days Complete   Home Care Screening  Complete  Medication Review (RN CM)  Complete  HRI or Home Care Consult Complete   Social Work Consult for Recovery Care Planning/Counseling Complete   Palliative Care Screening Complete   Medication Review  Oceanographer) Referral to Pharmacy

## 2024-09-15 NOTE — Discharge Summary (Signed)
 Physician Discharge Summary  Pedro Young FMW:[id number] DOB: Mar 08, 1933 DOA: 09/08/2024  PCP: Pcp, No  Admit date: 09/08/2024 Discharge date: 09/15/2024  Time spent: 45 minutes  Recommendations for Outpatient Follow-up:  Residential hospice for end-of-life care   Discharge Diagnoses:  Principal Problem:   Atrial fibrillation with RVR (HCC) Bronchitis/early pneumonia Adult failure to thrive Encephalopathy   Acute on chronic anemia   ESRD on dialysis (HCC)   HYPERTENSION, BENIGN ESSENTIAL   Subclinical hypothyroidism   CAD (coronary artery disease)   Hyperlipidemia   Anemia of chronic renal failure, stage 4 (severe) (HCC)   Hypotension   Discharge Condition: Guarded  Diet recommendation: Comfort feeds Filed Weights   09/10/24 0822 09/10/24 1139 09/13/24 1423  Weight: 57.8 kg 56.2 kg 56 kg    History of present illness:  89 y.o. male past medical history significant for end-stage renal disease on hemodialysis Tuesday Thursdays and Saturdays, hypertension, BPH comes in for dizziness and palpitation found to be in A-fib with RVR   Hospital Course:    89/M with ESRD, BPH, admitted with fever, A-fib RVR, weakness and failure to thrive. - Treated for bronchitis, elevated procalcitonin with ceftriaxone  and azithromycin  -Noted to have portal vein thrombosis on imaging and questionable lesions in the liver and kidneys -Hospital course complicated by failure to thrive, metabolic encephalopathy, ongoing decline -Dr. Celinda and cardiology team consulted palliative care -Family meeting completed yesterday morning, plan for comfort focused care, residential hospice  Discharge Exam: Vitals:   09/14/24 0752 09/14/24 1930  BP: (!) 102/51 (!) 127/50  Pulse: 80 82  Resp: 20 18  Temp: 98.1 F (36.7 C) 98.4 F (36.9 C)  SpO2: 97% 97%   Chronically ill frail male, AO x 2 CVS: S1-S2, irregular rhythm Lungs: Decreased breath sounds to bases Abdomen: Soft, nontender, bowel sounds  present Extremities: No edema  Discharge Instructions    Allergies as of 09/15/2024       Reactions   Ranexa  [ranolazine  Er] Other (See Comments)   Myclonus, hallucinations   Atorvastatin    REACTION: arthralgia Other Reaction(s): Unknown   Lovastatin    REACTION: arthralgia Other Reaction(s): Unknown   Amoxicillin-pot Clavulanate Other (See Comments)   Did it involve swelling of the face/tongue/throat, SOB, or low BP? Unknown Did it involve sudden or severe rash/hives, skin peeling, or any reaction on the inside of your mouth or nose? Unknown Did you need to seek medical attention at a hospital or doctor's office? Unknown When did it last happen? patient is unfamiliar with this allergy   If all above answers are NO, may proceed with cephalosporin use.        Medication List     STOP taking these medications    aspirin  EC 81 MG tablet   colchicine  0.6 MG tablet   diclofenac Sodium 1 % Gel Commonly known as: VOLTAREN   isosorbide  mononitrate 60 MG 24 hr tablet Commonly known as: IMDUR    Lokelma 10 g Pack packet Generic drug: sodium zirconium cyclosilicate   metoprolol  tartrate 25 MG tablet Commonly known as: LOPRESSOR    nitroGLYCERIN  0.4 MG SL tablet Commonly known as: NITROSTAT    rosuvastatin  10 MG tablet Commonly known as: CRESTOR    rosuvastatin  40 MG tablet Commonly known as: CRESTOR    sevelamer 800 MG tablet Commonly known as: RENAGEL   tamsulosin  0.4 MG Caps capsule Commonly known as: FLOMAX        TAKE these medications    acetaminophen  325 MG tablet Commonly known as: TYLENOL  Take 2 tablets (  650 mg total) by mouth every 6 (six) hours as needed for mild pain (pain score 1-3) (or Fever >/= 101). What changed:  when to take this reasons to take this   HYDROcodone-acetaminophen  5-325 MG tablet Commonly known as: NORCO/VICODIN Take 1-2 tablets by mouth every 4 (four) hours as needed for moderate pain (pain score 4-6).   mirtazapine  15  MG tablet Commonly known as: REMERON  Take 1 tablet (15 mg total) by mouth daily. What changed: Another medication with the same name was removed. Continue taking this medication, and follow the directions you see here.       Allergies  Allergen Reactions   Ranexa  [Ranolazine  Er] Other (See Comments)    Myclonus, hallucinations   Atorvastatin     REACTION: arthralgia  Other Reaction(s): Unknown   Lovastatin     REACTION: arthralgia  Other Reaction(s): Unknown   Amoxicillin-Pot Clavulanate Other (See Comments)    Did it involve swelling of the face/tongue/throat, SOB, or low BP? Unknown Did it involve sudden or severe rash/hives, skin peeling, or any reaction on the inside of your mouth or nose? Unknown Did you need to seek medical attention at a hospital or doctor's office? Unknown When did it last happen? patient is unfamiliar with this allergy   If all above answers are NO, may proceed with cephalosporin use.       The results of significant diagnostics from this hospitalization (including imaging, microbiology, ancillary and laboratory) are listed below for reference.    Significant Diagnostic Studies: MR ABDOMEN W WO CONTRAST Result Date: 09/13/2024 EXAM: MRCP WITH AND WITHOUT IV CONTRAST 09/13/2024 09:21:11 AM TECHNIQUE: Multisequence, multiplanar magnetic resonance images of the abdomen with and without intravenous contrast. MRCP sequences were performed. CONTRAST: 5.5 mL of Gadavist. COMPARISON: MR Abdomen 06/06/2014. Abdominopelvic CT of 09/12/2024. CLINICAL HISTORY: Acute abdominal pain, occlusive thrombus in the right portal vein and nonocclusive thrombus in the bifurcation of the left and right portal veins and small volume nonocclusive thrombus in the proximal left portal venous anatomy. FINDINGS: LIMITATIONS: Mild motion degradation throughout. LIVER: Iron deposition in the liver is evidenced by decreased signal on T2 and long T1 weighted imaging. Scattered hepatic  cysts. Again identified is occlusive thrombus throughout the right portal vein and its branches. Minimal extension of thrombus into the proximalmost left portal vein. Example images 50 and 46 of series 901 respectively. Main portal vein is patent. GALLBLADDER AND BILIARY SYSTEM: Cholecystectomy. No biliary duct dilatation. SPLEEN: Iron deposition in the spleen is evidenced by decreased signal on T2 and long T1 weighted imaging. PANCREAS/PANCREATIC DUCT: Tiny cystic foci within the pancreatic body including image 23/4 are likely areas of side branch duct ectasia. No acute inflammation. ADRENAL GLANDS: Normal adrenal glands. KIDNEYS: Atrophic kidneys bilaterally. Multiple nonenhancing renal lesions bilaterally; many lesions demonstrate precontrast hyperintensity on series 900, consistent with hemorrhagic/proteinaceous components. LYMPH NODES: No enlarged abdominal lymph nodes. VASCULATURE: Aortic atherosclerosis. Portal vein thrombus as detailed under LIVER. PERITONEUM: No ascites. ABDOMINAL WALL: No hernia. BOWEL: Grossly unremarkable. No bowel obstruction. BONES: No acute abnormality or worrisome osseous lesion. SOFT TISSUES: Unremarkable. MISCELLANEOUS: Small bilateral pleural effusions with bibasilar atelectasis. IMPRESSION: 1. Motion degraded exam. 2. Redemonstration of right greater than left portal vein thrombus, as detailed above. 3. Haemosiderosis. 4. Findings which are likely related to chronic pancreatitis and are of doubtful clinical significance, given patient age and comorbidities. 5. small bilateral pleural effusions. Electronically signed by: Rockey Kilts MD 09/13/2024 11:55 AM EST RP Workstation: [id number]   CT ABDOMEN PELVIS  W CONTRAST Addendum Date: 09/12/2024 ADDENDUM REPORT: 09/12/2024 07:40 ADDENDUM: Critical Value/emergent results were called by telephone at the time of interpretation on 09/12/2024 at 7:40 am to provider PAULINE DIBIA , who verbally acknowledged these results. Electronically  Signed   By: Camellia Candle M.D.   On: 09/12/2024 07:40   Result Date: 09/12/2024 CLINICAL DATA:  Abdominal pain. EXAM: CT ABDOMEN AND PELVIS WITH CONTRAST TECHNIQUE: Multidetector CT imaging of the abdomen and pelvis was performed using the standard protocol following bolus administration of intravenous contrast. RADIATION DOSE REDUCTION: This exam was performed according to the departmental dose-optimization program which includes automated exposure control, adjustment of the mA and/or kV according to patient size and/or use of iterative reconstruction technique. CONTRAST:  75mL OMNIPAQUE  IOHEXOL  350 MG/ML SOLN COMPARISON:  09/27/2021 FINDINGS: Lower chest: Interval progression of bilateral calcified pleural plaques in the 3 year interval since prior study. Dependent atelectasis/scarring is progressive since prior. Hepatobiliary: Occlusive thrombus noted in the right portal vein with nonocclusive thrombus in the bifurcation of the left and right portal veins in small volume nonocclusive thrombus in the proximal left portal venous anatomy. Pneumobilia suggests prior sphincterotomy. Multiple scattered tiny well-defined hypodensities in the liver parenchyma are too small to characterize but are likely cysts. There are clustered areas of ill-defined low-density in the periphery of the right hepatic lobe, indeterminate. Gallbladder surgically absent. Pancreas: Gas in the main pancreatic duct likely due to prior sphincterotomy. Spleen: No splenomegaly. No suspicious focal mass lesion. Adrenals/Urinary Tract: No adrenal nodule or mass. Kidneys are atrophic. Multiple low-density renal lesions bilaterally are compatible with cysts of varying size and complexity. 8 mm lesion lower pole right kidney on 45/3 may enhance raising the question of a tiny renal cell carcinoma. No evidence for hydroureter. Bladder wall trabeculation with multiple bladder wall diverticuli suggests underlying component of bladder outlet  obstruction. Stomach/Bowel: Moderate distention of the stomach. Wall thickening is noted in the region of the gastric pylorus. Duodenal bulb is patulous. Multiple duodenal diverticuli evident. No small bowel obstruction. The terminal ileum is normal. The appendix is not well visualized, but there is no edema or inflammation in the region of the cecal tip to suggest appendicitis. Trace fluid is noted around the cecum (coronal 47/6). Diffuse diverticular disease noted in the colon with marked wall thickening in the sigmoid segment no overt pericolonic edema inflammation to suggest acute diverticulitis. Vascular/Lymphatic: See above for description of portal vein thrombus. There is advanced atherosclerotic calcification of the abdominal aorta without aneurysm. Upper normal lymph nodes are seen in the hepatoduodenal ligament, likely reactive. No retroperitoneal lymphadenopathy. No pelvic lymphadenopathy. Reproductive: Prostate gland is heterogeneous and generates mass-effect on the bladder base suggesting median lobe hypertrophy. Other: No substantial intraperitoneal free fluid. Musculoskeletal: No worrisome lytic or sclerotic osseous abnormality. IMPRESSION: 1. Occlusive thrombus in the right portal vein and nonocclusive thrombus in the bifurcation of the left and right portal veins and small volume nonocclusive thrombus in the proximal left portal venous anatomy. Main portal vein is patent. 2. Clustered areas of ill-defined low-density in the periphery of the right hepatic lobe, indeterminate. MRI of the abdomen with and without contrast recommended to further evaluate and exclude underlying infiltrative mass lesion. 3. 8 mm lesion lower pole right kidney may enhance raising the question of a tiny renal cell carcinoma. Attention on follow-up MRI recommended. 4. Diffuse diverticular disease in the colon with marked wall thickening in the sigmoid segment. No overt pericolonic edema inflammation to suggest acute  diverticulitis. 5. Bladder wall  trabeculation with multiple bladder wall diverticuli suggests underlying component of bladder outlet obstruction. 6. Interval progression of bilateral calcified pleural plaques in the 3 year interval since prior study. 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 7.  Aortic Atherosclerosis (ICD10-I70.0). Electronically Signed: By: Camellia Candle M.D. On: 09/12/2024 07:27   DG Chest 1 View Result Date: 09/10/2024 EXAM: 1 VIEW(S) XRAY OF THE CHEST 09/10/2024 12:05:28 AM COMPARISON: 09/08/2024 CLINICAL HISTORY: Pneumonia FINDINGS: LUNGS AND PLEURA: No focal pulmonary opacity. No pulmonary edema. No pleural effusion. No pneumothorax. Persistent elevated right hemidiaphragm. Bilateral calcified pleural plaques, unchanged. HEART AND MEDIASTINUM: Aortic atherosclerosis. BONES AND SOFT TISSUES: No acute osseous abnormality. IMPRESSION: 1. No acute cardiopulmonary findings. 2. Bilateral calcified pleural plaques, unchanged. Electronically signed by: Norman Gatlin MD 09/10/2024 12:29 AM EST RP Workstation: [id number]   ECHOCARDIOGRAM COMPLETE Result Date: 09/09/2024    ECHOCARDIOGRAM REPORT   Patient Name:   Pedro Young Date of Exam: 09/09/2024 Medical Rec #:  [id number]       Height:       66.0 in Accession #:    [id number]      Weight:       150.0 lb Date of Birth:  [birth year]        BSA:          1.770 m Patient Age:    89 years        BP:           117/49 mmHg Patient Gender: M               HR:           86 bpm. Exam Location:  Inpatient Procedure: 2D Echo, Cardiac Doppler and Color Doppler (Both Spectral and Color            Flow Doppler were utilized during procedure). Indications:    Atrial Fibrillation I48.91  History:        Patient has prior history of Echocardiogram examinations, most                 recent 08/01/2020. CAD, Arrythmias:hx of AFIB; Risk                 Factors:Hypertension and Former Smoker.  Sonographer:    Koleen Popper RDCS Referring Phys: [id number]  ANASTASSIA DOUTOVA IMPRESSIONS  1. Left ventricular ejection fraction, by estimation, is 60 to 65%. The left ventricle has normal function. The left ventricle has no regional wall motion abnormalities. There is mild left ventricular hypertrophy of the basal-septal segment. Left ventricular diastolic parameters are indeterminate.  2. Right ventricular systolic function is normal. The right ventricular size is normal. There is mildly elevated pulmonary artery systolic pressure. The estimated right ventricular systolic pressure is 39.1 mmHg.  3. Small echogenic density noted on the posterior mitral valve leaflet. Noted on apical 4 chamber (image 43 and 44) and apical 2 chamber (image 75 and 76) on the atrial side. The differential diagnosis includes reverberation artifact from MAC, leaflet calcification, healed thrombus/vegetation, etc. This was not present on prior transthoracic echocardiogram from September 2021. Clinical correlation required.. The mitral valve is degenerative. Mild mitral valve regurgitation. No evidence of mitral stenosis.  4. The aortic valve is tricuspid. Aortic valve regurgitation is not visualized. Aortic valve sclerosis is present, with no evidence of aortic valve stenosis.  5. The inferior vena cava is normal in size with <50% respiratory variability, suggesting right atrial pressure of 8 mmHg. Comparison(s): A prior study was performed on 08/01/2020.  Prior images reviewed side by side. LVEF 66%, grade 1 diastolic dysfunction, mild LAE, mild MR, moderate TR, estimated RAP 8 mmHg. Conclusion(s)/Recommendation(s): Consider transesophageal echocardiogram to further evaluate the mitral valve, clinical correlation required. See above. FINDINGS  Left Ventricle: Left ventricular ejection fraction, by estimation, is 60 to 65%. The left ventricle has normal function. The left ventricle has no regional wall motion abnormalities. The left ventricular internal cavity size was normal in size. There is   mild left ventricular hypertrophy of the basal-septal segment. Left ventricular diastolic parameters are indeterminate. Right Ventricle: The right ventricular size is normal. No increase in right ventricular wall thickness. Right ventricular systolic function is normal. There is mildly elevated pulmonary artery systolic pressure. The tricuspid regurgitant velocity is 2.79  m/s, and with an assumed right atrial pressure of 8 mmHg, the estimated right ventricular systolic pressure is 39.1 mmHg. Left Atrium: Left atrial size was normal in size. Right Atrium: Right atrial size was normal in size. Pericardium: There is no evidence of pericardial effusion. Mitral Valve: Small echogenic density noted on the posterior mitral valve leaflet. Noted on apical 4 chamber (image 43 and 44) and apical 2 chamber (image 75 and 76) on the atrial side. The differential diagnosis includes reverberation artifact from MAC,  leaflet calcification, healed thrombus/vegetation, etc. This was not present on prior transthoracic echocardiogram from September 2021. Clinical correlation required. The mitral valve is degenerative in appearance. There is mild thickening of the mitral  valve leaflet(s). There is mild calcification of the mitral valve leaflet(s). Mild to moderate mitral annular calcification. Mild mitral valve regurgitation. No evidence of mitral valve stenosis. Tricuspid Valve: The tricuspid valve is normal in structure. Tricuspid valve regurgitation is mild . No evidence of tricuspid stenosis. Aortic Valve: The aortic valve is tricuspid. Aortic valve regurgitation is not visualized. Aortic valve sclerosis is present, with no evidence of aortic valve stenosis. Pulmonic Valve: The pulmonic valve was not well visualized. Pulmonic valve regurgitation is not visualized. Aorta: The aortic root and ascending aorta are structurally normal, with no evidence of dilitation. Venous: The inferior vena cava is normal in size with less than 50%  respiratory variability, suggesting right atrial pressure of 8 mmHg. IAS/Shunts: The atrial septum is grossly normal.  LEFT VENTRICLE PLAX 2D LVIDd:         4.10 cm      Diastology LVIDs:         2.90 cm      LV e' medial:    8.05 cm/s LV PW:         1.10 cm      LV E/e' medial:  11.1 LV IVS:        1.40 cm      LV e' lateral:   8.92 cm/s LVOT diam:     1.80 cm      LV E/e' lateral: 10.0 LV SV:         53 LV SV Index:   30 LVOT Area:     2.54 cm  LV Volumes (MOD) LV vol d, MOD A4C: 121.0 ml LV vol s, MOD A4C: 57.8 ml LV SV MOD A4C:     121.0 ml RIGHT VENTRICLE             IVC RV Basal diam:  3.31 cm     IVC diam: 2.04 cm RV S prime:     23.60 cm/s TAPSE (M-mode): 2.2 cm LEFT ATRIUM  Index        RIGHT ATRIUM           Index LA diam:        3.72 cm 2.10 cm/m   RA Area:     13.10 cm LA Vol (A2C):   34.0 ml 19.21 ml/m  RA Volume:   25.00 ml  14.13 ml/m LA Vol (A4C):   27.6 ml 15.60 ml/m LA Biplane Vol: 31.9 ml 18.03 ml/m  AORTIC VALVE LVOT Vmax:   120.00 cm/s LVOT Vmean:  80.300 cm/s LVOT VTI:    0.208 m  AORTA Ao Root diam: 3.42 cm Ao Asc diam:  3.60 cm MITRAL VALVE                TRICUSPID VALVE MV Area (PHT): 3.99 cm     TR Peak grad:   31.1 mmHg MV Decel Time: 190 msec     TR Vmax:        279.00 cm/s MR Peak grad: 65.9 mmHg MR Vmax:      406.00 cm/s   SHUNTS MV E velocity: 89.10 cm/s   Systemic VTI:  0.21 m MV A velocity: 105.00 cm/s  Systemic Diam: 1.80 cm MV E/A ratio:  0.85 Sunit Tolia Electronically signed by Madonna Large Signature Date/Time: 09/09/2024/12:48:50 PM    Final    CT Angio Chest PE W and/or Wo Contrast Result Date: 09/08/2024 EXAM: CTA of the Chest with contrast for PE 09/08/2024 08:53:33 PM TECHNIQUE: CTA of the chest was performed after the administration of 75 mL of iohexol  (OMNIPAQUE ) 350 MG/ML injection. Multiplanar reformatted images are provided for review. MIP images are provided for review. Automated exposure control, iterative reconstruction, and/or weight based  adjustment of the mA/kV was utilized to reduce the radiation dose to as low as reasonably achievable. COMPARISON: Comparison with same day chest x-ray and CT 3/ 19 / 11. CLINICAL HISTORY: Pulmonary embolism (PE) suspected, high prob. Including shortness of breath and new atrial fibrillation in the clinical indication. FINDINGS: PULMONARY ARTERIES: Pulmonary arteries are adequately opacified for evaluation. No pulmonary embolism. Main pulmonary artery is normal in caliber. MEDIASTINUM: The heart demonstrates coronary artery atherosclerotic calcification. The pericardium demonstrates no acute abnormality. There is aortic atherosclerotic calcification. LYMPH NODES: No mediastinal, hilar or axillary lymphadenopathy. LUNGS AND PLEURA: Diffuse bronchial wall thickening. Lower lung atelectasis and scarring. Bilateral pleural thickening and calcified pleural plaques greatest in the posterior lower lobes. No focal consolidation or pulmonary edema. No pleural effusion or pneumothorax. UPPER ABDOMEN: Pneumobilia. Cholecystectomy. Small hiatal hernia. SOFT TISSUES AND BONES: No acute bone or soft tissue abnormality. IMPRESSION: 1. No pulmonary embolism. 2. Diffuse bronchial wall thickening compatible with airway infection or inflammation. 3. 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 Electronically signed by: Norman Gatlin MD 09/08/2024 09:05 PM EST RP Workstation: [id number]   DG Chest Port 1 View Result Date: 09/08/2024 EXAM: 1 VIEW(S) XRAY OF THE CHEST 09/08/2024 05:18:00 PM COMPARISON: None available. CLINICAL HISTORY: sob, new afib FINDINGS: LUNGS AND PLEURA: Low lung volumes. Right lower lung zone calcified granuloma. Left chest calcified pleural plaques have progressed from prior. No pulmonary edema. No pleural effusion. No pneumothorax. HEART AND MEDIASTINUM: Atherosclerotic plaque. No acute abnormality of the cardiac and mediastinal silhouettes. BONES AND SOFT TISSUES: No acute osseous abnormality.  IMPRESSION: 1. No acute cardiopulmonary process identified. 2. Left chest calcified pleural plaques have progressed from prior. Electronically signed by: Norman Gatlin MD 09/08/2024 05:33 PM EST RP Workstation: [id number]    Microbiology: Recent Results (from  the past 240 hours)  Resp panel by RT-PCR (RSV, Flu A&B, Covid) Anterior Nasal Swab     Status: None   Collection Time: 09/10/24 12:16 AM   Specimen: Anterior Nasal Swab  Result Value Ref Range Status   SARS Coronavirus 2 by RT PCR NEGATIVE NEGATIVE Final   Influenza A by PCR NEGATIVE NEGATIVE Final   Influenza B by PCR NEGATIVE NEGATIVE Final    Comment: (NOTE) The Xpert Xpress SARS-CoV-2/FLU/RSV plus assay is intended as an aid in the diagnosis of influenza from Nasopharyngeal swab specimens and should not be used as a sole basis for treatment. Nasal washings and aspirates are unacceptable for Xpert Xpress SARS-CoV-2/FLU/RSV testing.  Fact Sheet for Patients: bloggercourse.com  Fact Sheet for Healthcare Providers: seriousbroker.it  This test is not yet approved or cleared by the [place]  FDA and has been authorized for detection and/or diagnosis of SARS-CoV-2 by FDA under an Emergency Use Authorization (EUA). This EUA will remain in effect (meaning this test can be used) for the duration of the COVID-19 declaration under Section 564(b)(1) of the Act, 21 U.S.C. section 360bbb-3(b)(1), unless the authorization is terminated or revoked.     Resp Syncytial Virus by PCR NEGATIVE NEGATIVE Final    Comment: (NOTE) Fact Sheet for Patients: bloggercourse.com  Fact Sheet for Healthcare Providers: seriousbroker.it  This test is not yet approved or cleared by the [place]  FDA and has been authorized for detection and/or diagnosis of SARS-CoV-2 by FDA under an Emergency Use Authorization (EUA). This EUA will remain in  effect (meaning this test can be used) for the duration of the COVID-19 declaration under Section 564(b)(1) of the Act, 21 U.S.C. section 360bbb-3(b)(1), unless the authorization is terminated or revoked.  Performed at [facility], [address] [place], [place] [address]   Culture, blood (Routine X 2) w Reflex to ID Panel     Status: None   Collection Time: 09/10/24  5:19 AM   Specimen: BLOOD RIGHT HAND  Result Value Ref Range Status   Specimen Description BLOOD RIGHT HAND  Final   Special Requests   Final    BOTTLES DRAWN AEROBIC ONLY Blood Culture adequate volume   Culture   Final    NO GROWTH 5 DAYS Performed at [facility], [address], [place], [place] [address]    Report Status 09/15/2024 FINAL  Final  Culture, blood (Routine X 2) w Reflex to ID Panel     Status: None   Collection Time: 09/10/24  7:04 AM   Specimen: BLOOD RIGHT ARM  Result Value Ref Range Status   Specimen Description BLOOD RIGHT ARM  Final   Special Requests   Final    BOTTLES DRAWN AEROBIC AND ANAEROBIC Blood Culture results may not be optimal due to an inadequate volume of blood received in culture bottles   Culture   Final    NO GROWTH 5 DAYS Performed at [facility], [address], [place], [place] [address]    Report Status 09/15/2024 FINAL  Final  MRSA Next Gen by PCR, Nasal     Status: None   Collection Time: 09/10/24  2:53 PM   Specimen: Nasal Mucosa; Nasal Swab  Result Value Ref Range Status   MRSA by PCR Next Gen NOT DETECTED NOT DETECTED Final    Comment: (NOTE) The GeneXpert MRSA Assay (FDA approved for NASAL specimens only), is one component of a comprehensive MRSA colonization surveillance program. It is not intended to  diagnose MRSA infection nor to guide or monitor treatment for MRSA infections. Test performance is not FDA approved in patients less than 49 years old. Performed at [facility], [address] [place], [place] [address]       Labs: Basic Metabolic Panel: Recent Labs  Lab 09/08/24 1603 09/08/24 1624 09/08/24 1625 09/09/24 0311 09/10/24 0820 09/11/24 1249 09/13/24 0453  NA 138 137 137 132* 135 134* 133*  K 3.1* 3.1* 3.1* 4.2 4.2 4.1 4.0  CL 93* 94*  --  92* 95* 94* 92*  CO2 26  --   --  25 25 27 25   GLUCOSE 88 85  --  95 94 147* 94  BUN 15 16  --  25* 54* 48* 85*  CREATININE 3.38* 3.10*  --  4.55* 7.43* 6.02* 8.03*  CALCIUM  8.2*  --   --  7.3* 7.8* 7.6* 7.9*  MG 1.8  --   --  1.7  --   --  2.3  PHOS  --   --   --  3.0 4.1  --  3.4   Liver Function Tests: Recent Labs  Lab 09/08/24 1603 09/09/24 0311 09/10/24 0820 09/11/24 1249 09/13/24 0453  AST 46* 49*  --  50*  --   ALT 39 37  --  37  --   ALKPHOS 177* 171*  --  298*  --   BILITOT 1.6* 1.4*  --  1.3*  --   PROT 6.6 5.6*  --  5.4*  --   ALBUMIN 2.6* 2.1* 1.8* 1.7* 1.6*   No results for input(s): LIPASE, AMYLASE in the last 168 hours. No results for input(s): AMMONIA in the last 168 hours. CBC: Recent Labs  Lab 09/08/24 1603 09/08/24 1624 09/10/24 0704 09/11/24 0540 09/11/24 1249 09/12/24 1036 09/13/24 0453  WBC 9.3   < > 12.0* 11.2* 11.4* 12.2* 12.7*  NEUTROABS 7.3  --   --   --   --   --   --   HGB 11.2*   < > 8.4* 8.4* 8.3* 7.9* 8.1*  HCT 33.3*   < > 24.3* 24.1* 23.9* 22.6* 22.9*  MCV 97.7   < > 93.1 92.7 92.6 91.1 89.8  PLT 149*   < > 131* 138* 144* 165 211   < > = values in this interval not displayed.   Cardiac Enzymes: No results for input(s): CKTOTAL, CKMB, CKMBINDEX, TROPONINI in the last 168 hours. BNP: BNP (last 3 results) Recent Labs    09/08/24 1603  BNP 270.8*    ProBNP (last 3 results) No results for input(s): PROBNP in the last 8760 hours.  CBG: Recent Labs  Lab 09/08/24 1601  GLUCAP 83       Signed:  Sigurd Pac MD.  [facility] 09/15/2024, 9:44 AM

## 2024-09-15 NOTE — Plan of Care (Signed)
   Problem: Skin Integrity: Goal: Risk for impaired skin integrity will decrease Outcome: Progressing

## 2024-09-15 NOTE — Progress Notes (Signed)
 Daily Progress Note   Patient Name: Pedro Young       Date: 09/15/2024 DOB: [birth year]  Age: 89 y.o. MRN#: [id number] Attending Physician: Fairy Frames, MD Primary Care Physician: Pcp, No Admit Date: 09/08/2024  Reason for Consultation/Follow-up: Non pain symptom management, Pain control, Psychosocial/spiritual support, and Terminal Care  Subjective: I have reviewed medical records including EPIC notes and MAR.  As needed medications administered in the last 24 hours: Norco x 1.  Received report from primary RN -no acute concerns.  Per RN he has minimal oral intake, taking sips of Ensure.  Went to visit patient at bedside-4 family members present.  Patient was lying in bed intermittently asleep/awake. No signs or non-verbal gestures of pain or discomfort noted. No respiratory distress, increased work of breathing, or secretions noted.  He states  I feel good.  He denies pain.  Emotional support provided to family.  Therapeutic listening provided as they reflect on the beautiful day yesterday.  Multiple family members able to come visit and patient was able to have meaningful interaction.  Family confirm goal remains transfer to [facility] when bed available.  All questions and concerns addressed. Encouraged to call with questions and/or concerns. PMT card previously provided.  Length of Stay: 6  Current Medications: Scheduled Meds:   amiodarone  200 mg Oral BID   antiseptic oral rinse  15 mL Topical BID   Chlorhexidine  Gluconate Cloth  6 each Topical Q0600   sodium chloride  flush  3 mL Intravenous Q12H   tamsulosin   0.4 mg Oral Daily    Continuous Infusions:   PRN Meds: acetaminophen  **OR** acetaminophen , artificial tears, diphenhydrAMINE, feeding supplement (NEPRO CARB  STEADY), glycopyrrolate **OR** glycopyrrolate **OR** glycopyrrolate, haloperidol  **OR** haloperidol  **OR** haloperidol  lactate, HYDROcodone-acetaminophen , HYDROmorphone (DILAUDID) injection, LORazepam **OR** LORazepam **OR** LORazepam, metoprolol  tartrate, ondansetron  **OR** ondansetron  (ZOFRAN ) IV, sodium chloride  flush  Physical Exam Vitals and nursing note reviewed.  Constitutional:      General: He is not in acute distress.    Appearance: He is ill-appearing.     Comments: frail  Pulmonary:     Effort: No respiratory distress.  Skin:    General: Skin is warm and dry.  Neurological:     Mental Status: He is alert.     Motor: Weakness present.  Psychiatric:  Behavior: Behavior is cooperative.             Vital Signs: BP (!) 127/50 (BP Location: Right Arm)   Pulse 82   Temp 98.4 F (36.9 C) (Oral)   Resp 18   Ht 5' 6 (1.676 m)   Wt 56 kg   SpO2 97%   BMI 19.93 kg/m  SpO2: SpO2: 97 % O2 Device: O2 Device: Room Air O2 Flow Rate: O2 Flow Rate (L/min): 2 L/min  Intake/output summary:  Intake/Output Summary (Last 24 hours) at 09/15/2024 0834 Last data filed at 09/15/2024 0300 Gross per 24 hour  Intake 0 ml  Output --  Net 0 ml   LBM: Last BM Date : 09/14/24 Baseline Weight: Weight: 68 kg Most recent weight: Weight: 56 kg       Palliative Assessment/Data: PPS 40%      Patient Active Problem List   Diagnosis Date Noted   Hypotension 09/09/2024   Atrial fibrillation with RVR (HCC) 09/08/2024   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   ESRD on dialysis (HCC) 04/18/2023   BPH (benign prostatic hyperplasia) 04/18/2023   Acute on chronic anemia 04/18/2023   Protein-calorie malnutrition, severe 04/07/2023   Hyponatremia 04/06/2023   CAD (coronary artery disease) 04/06/2023   Metabolic acidosis 04/06/2023   Subclinical hypothyroidism 05/25/2022   Angina pectoris 06/19/2020   Hyperlipidemia 05/16/2013   Anemia of chronic renal failure, stage 4 (severe) (HCC) 01/18/2010    Liver abscess via hepatic artery 2011, 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 male with past medical history of ESRD on TThS, HLD, HTN, CAD, BPH was admitted on 09/08/2024 with atrial fibrillation with RVR, acute on chronic anemia, hypotension. Hospital course has been complicated by sepsis secondary to pneumonia and hepatic and renal lesions noted on abdominal CT.   Assessment: Principal Problem:   Atrial fibrillation with RVR (HCC) Active Problems:   Anemia of chronic renal failure, stage 4 (severe) (HCC)   HYPERTENSION, BENIGN ESSENTIAL   CAD (coronary artery disease)   ESRD on dialysis (HCC)   Hyperlipidemia   Subclinical hypothyroidism   Acute on chronic anemia   Hypotension   Terminal care  Recommendations/Plan: Continue full comfort measures Continue DNR/DNI as previously documented Transfer to [facility] when bed available Continue current comfort focused medication regimen as noted below-no changes No further HD PMT will continue to follow and support holistically  Symptom Management Dilaudid PRN pain/dyspnea/increased work of breathing/RR>25 Norco PRN moderate pain Tylenol  PRN pain/fever Biotin twice daily Benadryl PRN itching Robinul PRN secretions Haldol  PRN agitation/delirium Ativan PRN anxiety/seizure/sleep/distress Zofran  PRN nausea/vomiting Liquifilm Tears PRN dry eye Continue amiodarone, metoprolol , and Flomax  while tolerating p.o.'s  Goals of Care and Additional Recommendations: Limitations on Scope of Treatment: Full Comfort Care  Code Status:    Code Status Orders  (From admission, onward)           Start     Ordered    09/14/24 0941  Do not attempt resuscitation (DNR) - Comfort care  Continuous       Question Answer Comment  If patient has no pulse and is not breathing Do Not Attempt Resuscitation   In Pre-Arrest Conditions (Patient Is Breathing and Has a  Pulse) Provide comfort measures. Relieve any mechanical airway obstruction. Avoid transfer unless required for comfort.   Consent: Discussion documented in EHR or advanced directives reviewed      09/14/24 0943           Code Status History     Date Active Date Inactive Code Status Order ID Comments User Context   09/09/2024 0023 09/14/2024 0943 Full Code [id number]  Silvester Ales, MD ED   04/18/2023 1715 04/21/2023 2124 DNR [id number]  Waddell Rake, MD ED   04/18/2023 1703 04/18/2023 1715 DNR [id number]  Waddell Rake, MD ED   04/06/2023 2028 04/10/2023 1830 Full Code [id number]  Charlton Evalene RAMAN, MD Inpatient   06/19/2020 1320 06/19/2020 2038 Full Code [id number]  Claudene Victory ORN, MD Inpatient   08/08/2019 1553 08/12/2019 1612 Full Code [id number]  Arlice Reichert, MD ED   08/02/2015 1821 08/09/2015 1438 DNR [id number]  Royal Sill, MD Inpatient       Prognosis:  < 2 weeks  Discharge Planning: Hospice facility  Care plan was discussed with primary RN, patient, patient's family  Thank you for allowing the Palliative Medicine Team to assist in the care of this patient.     Jeoffrey CHRISTELLA Claudene, NP  Please contact Palliative Medicine Team phone at [phone] for questions and concerns.   *Portions of this note are a verbal dictation therefore any spelling and/or grammatical errors are due to the Dragon Medical One system interpretation.

## 2024-09-15 NOTE — Progress Notes (Signed)
 [facility] [phone] [facility] Liaison Note  Received request from [facility] manager for family interest in [facility]. Eligibility confirmed.   Unfortunately, [facility] is unable to offer a bed today.IP CM aware that liaison will follow up tomorrow or sooner if a bed becomes available.   Thank you for allowing us  to participate in this patient's care.  [name] Nail, LPN [facility] Liaison [phone]

## 2024-09-15 NOTE — Plan of Care (Signed)
  Problem: Education: Goal: Knowledge of General Education information will improve Description: Including pain rating scale, medication(s)/side effects and non-pharmacologic comfort measures Outcome: Progressing   Problem: Clinical Measurements: Goal: Will remain free from infection Outcome: Progressing   Problem: Coping: Goal: Level of anxiety will decrease Outcome: Progressing   Problem: Pain Managment: Goal: General experience of comfort will improve and/or be controlled Outcome: Progressing

## 2024-09-15 NOTE — Care Management Important Message (Signed)
 Important Message  Patient Details  Name: Pedro Young MRN: [id number] Date of Birth: 03-28-1933   Important Message Given:  Yes - Medicare IM     [name] 09/15/2024, 11:01 AM

## 2024-09-16 DIAGNOSIS — I4891 Unspecified atrial fibrillation: Secondary | ICD-10-CM | POA: Diagnosis not present

## 2024-09-16 DIAGNOSIS — Z66 Do not resuscitate: Secondary | ICD-10-CM | POA: Diagnosis not present

## 2024-09-16 DIAGNOSIS — Z7189 Other specified counseling: Secondary | ICD-10-CM | POA: Diagnosis not present

## 2024-09-16 DIAGNOSIS — Z515 Encounter for palliative care: Secondary | ICD-10-CM | POA: Diagnosis not present

## 2024-09-16 NOTE — Progress Notes (Signed)
 Daily Progress Note   Patient Name: Pedro Young       Date: 09/16/2024 DOB: 1932-11-27  Age: 89 y.o. MRN#: [id number] Attending Physician: Fairy Frames, MD Primary Care Physician: Pcp, No Admit Date: 09/08/2024  Reason for Consultation/Follow-up: Non pain symptom management, Pain control, Psychosocial/spiritual support, and Terminal Care  Subjective: I have reviewed medical records including EPIC notes and MAR.  As needed medications administered in the last 24 hours: Dilaudid x 1.  Noted [facility] does not have an open bed today.  Received report from primary RN -no acute concerns.  Went to visit patient at bedside -5 family members present.  Patient was lying in bed asleep-I did not attempt to wake to preserve comfort.No signs or non-verbal gestures of pain or discomfort noted. No respiratory distress, increased work of breathing, or secretions noted.  He is ill and frail appearing.  Emotional support provided to family.  Therapeutic listening provided as they reflect on family visitation in the meaningful moments they are continuing to have with patient.  Family confirmed goal is for discharge to [facility] once bed is available.  Family also request that 1 of patient's 2 IVs be removed.  Comfort tray ordered for family.  All questions and concerns addressed. Encouraged to call with questions and/or concerns. PMT card previously provided.  Requested RN remove 1 of 2 IVs.   Length of Stay: 7  Current Medications: Scheduled Meds:   amiodarone  200 mg Oral BID   antiseptic oral rinse  15 mL Topical BID   Chlorhexidine  Gluconate Cloth  6 each Topical Q0600   sodium chloride  flush  3 mL Intravenous Q12H   tamsulosin   0.4 mg Oral Daily    Continuous Infusions:   PRN  Meds: acetaminophen  **OR** acetaminophen , artificial tears, diphenhydrAMINE, feeding supplement (NEPRO CARB STEADY), glycopyrrolate **OR** glycopyrrolate **OR** glycopyrrolate, haloperidol  **OR** haloperidol  **OR** haloperidol  lactate, HYDROcodone-acetaminophen , HYDROmorphone (DILAUDID) injection, LORazepam **OR** LORazepam **OR** LORazepam, metoprolol  tartrate, ondansetron  **OR** ondansetron  (ZOFRAN ) IV, sodium chloride  flush  Physical Exam Vitals and nursing note reviewed.  Constitutional:      General: He is not in acute distress.    Appearance: He is ill-appearing.     Comments: frail  Pulmonary:     Effort: No respiratory distress.  Skin:    General: Skin is warm and dry.  Neurological:  Mental Status: He is alert.     Motor: Weakness present.  Psychiatric:        Behavior: Behavior is cooperative.             Vital Signs: BP (!) 119/54 (BP Location: Right Arm)   Pulse 88   Temp 98.1 F (36.7 C) (Oral)   Resp 16   Ht 5' 6 (1.676 m)   Wt 56 kg   SpO2 99%   BMI 19.93 kg/m  SpO2: SpO2: 99 % O2 Device: O2 Device: Room Air O2 Flow Rate: O2 Flow Rate (L/min): 2 L/min  Intake/output summary: No intake or output data in the 24 hours ending 09/16/24 1049 LBM: Last BM Date : 09/15/24 Baseline Weight: Weight: 68 kg Most recent weight: Weight: 56 kg       Palliative Assessment/Data: PPS 20%      Patient Active Problem List   Diagnosis Date Noted   Hypotension 09/09/2024   Atrial fibrillation with RVR (HCC) 09/08/2024   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   ESRD on dialysis (HCC) 04/18/2023   BPH (benign prostatic hyperplasia) 04/18/2023   Acute on chronic anemia 04/18/2023   Protein-calorie malnutrition, severe 04/07/2023   Hyponatremia 04/06/2023   CAD (coronary artery disease) 04/06/2023   Metabolic acidosis 04/06/2023   Subclinical hypothyroidism 05/25/2022   Angina pectoris 06/19/2020   Hyperlipidemia 05/16/2013   Anemia of chronic renal failure, stage 4 (severe) (HCC)  01/18/2010   Liver abscess via hepatic artery 2011, 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 male with past medical history of ESRD on TThS, HLD, HTN, CAD, BPH was admitted on 09/08/2024 with atrial fibrillation with RVR, acute on chronic anemia, hypotension. Hospital course has been complicated by sepsis secondary to pneumonia and hepatic and renal lesions noted on abdominal CT.   Assessment: Principal Problem:   Atrial fibrillation with RVR (HCC) Active Problems:   Anemia of chronic renal failure, stage 4 (severe) (HCC)   HYPERTENSION, BENIGN ESSENTIAL   CAD (coronary artery disease)   ESRD on dialysis (HCC)   Hyperlipidemia   Subclinical hypothyroidism   Acute on chronic anemia   Hypotension   Terminal care  Recommendations/Plan: Continue full comfort measures Continue DNR/DNI as previously documented Transfer to [facility] when bed available Continue current comfort focused medication regimen as noted below-no changes No further HD PMT will continue to follow peripherally. If there are any imminent needs please call the service directly. If patient is still admitted on Sunday, PMT will follow up.   Symptom Management Dilaudid PRN pain/dyspnea/increased work of breathing/RR>25 Norco PRN moderate pain Tylenol  PRN pain/fever Biotin twice daily Benadryl PRN itching Robinul PRN secretions Haldol  PRN agitation/delirium Ativan PRN anxiety/seizure/sleep/distress Zofran  PRN nausea/vomiting Liquifilm Tears PRN dry eye Continue amiodarone, metoprolol , and Flomax  while tolerating p.o.'s    Goals of Care and Additional Recommendations: Limitations on Scope of  Treatment: Full Comfort Care  Code Status:    Code Status Orders  (From admission, onward)           Start     Ordered   09/14/24 0941  Do not attempt resuscitation (DNR) - Comfort care  Continuous       Question Answer Comment  If patient has no pulse and is not breathing Do Not Attempt Resuscitation   In Pre-Arrest Conditions (Patient Is Breathing and Has a Pulse) Provide comfort measures. Relieve any mechanical airway obstruction. Avoid transfer unless required for comfort.   Consent: Discussion documented in EHR or advanced directives reviewed      09/14/24 0943           Code Status History     Date Active Date Inactive Code Status Order ID Comments User Context   09/09/2024 0023 09/14/2024 0943 Full Code [id number]  Silvester Ales, MD ED   04/18/2023 1715 04/21/2023 2124 DNR [id number]  Waddell Rake, MD ED   04/18/2023 1703 04/18/2023 1715 DNR [id number]  Waddell Rake, MD ED   04/06/2023 2028 04/10/2023 1830 Full Code [id number]  Charlton Evalene RAMAN, MD Inpatient   06/19/2020 1320 06/19/2020 2038 Full Code [id number]  Claudene Victory ORN, MD Inpatient   08/08/2019 1553 08/12/2019 1612 Full Code [id number]  Arlice Reichert, MD ED   08/02/2015 1821 08/09/2015 1438 DNR [id number]  Royal Sill, MD Inpatient       Prognosis:  < 2 weeks  Discharge Planning: Hospice facility  Care plan was discussed with primary RN, patient's family  Thank you for allowing the Palliative Medicine Team to assist in the care of this patient.     Jeoffrey CHRISTELLA Claudene, NP  Please contact Palliative Medicine Team phone at [phone] for questions and concerns.   *Portions of this note are a verbal dictation therefore any spelling and/or grammatical errors are due to the Dragon Medical One system interpretation.

## 2024-09-16 NOTE — Progress Notes (Signed)
 Patient seen and examined, remains frail with failure to thrive -No changes from my discharge summary yesterday, awaiting residential hospice, per [facility]  Sigurd Pac, MD

## 2024-09-16 NOTE — TOC Progression Note (Signed)
 Transition of Care [facility]) - Progression Note    Patient Details  Name: Pedro Young MRN: [id number] Date of Birth: 22-Aug-1933  [facility] of [facility] CM/SW Contact  [name] Public, LCSWA Phone Number: 09/16/2024, 10:46 AM  Clinical Narrative:     [name] with [facility] informed CSW and MD no bed availability today at [facility]. [name] informed CSW and MD that she updated family.[name] liaison informed CSW and MD that liaison will follow up tomorrow or sooner if a bed becomes available. TOC will continue to follow.                    Expected Discharge Plan and Services                                               Social Drivers of Health (SDOH) Interventions SDOH Screenings   Food Insecurity: No Food Insecurity (09/09/2024)  Housing: Low Risk  (09/09/2024)  Transportation Needs: No Transportation Needs (09/09/2024)  Utilities: Not At Risk (09/09/2024)  Depression (PHQ2-9): Low Risk  (06/01/2023)  Financial Resource Strain: Low Risk  (04/06/2024)   Received from [facility]  Physical Activity: Insufficiently Active (07/08/2023)   Received from [facility]  Social Connections: Socially Isolated (09/09/2024)  Stress: No Stress Concern Present (07/08/2023)   Received from [facility]  Tobacco Use: Medium Risk (09/08/2024)    Readmission Risk Interventions    04/20/2023    2:37 PM 04/08/2023   10:49 AM  Readmission Risk Prevention Plan  Transportation Screening Complete Complete  PCP or Specialist Appt within 5-7 Days  Complete  PCP or Specialist Appt within 3-5 Days Complete   Home Care Screening  Complete  Medication Review (RN CM)  Complete  HRI or Home Care Consult Complete   Social Work Consult for Recovery Care Planning/Counseling Complete   Palliative Care Screening Complete   Medication Review Oceanographer) Referral to Pharmacy

## 2024-09-16 NOTE — Progress Notes (Signed)
 [facility] [phone] [facility] Liaison Note  Received request from [facility] manager for family interest in [facility]. Eligibility confirmed.   Unfortunately, [facility] is unable to offer a bed today.IP CM aware that liaison will follow up tomorrow or sooner if a bed becomes available.   Thank you for allowing us  to participate in this patient's care.  [name] Nail, LPN [facility] Liaison [phone]

## 2024-09-17 NOTE — TOC Progression Note (Signed)
 Transition of Care [facility]) - Progression Note    Patient Details  Name: Zaylan Kissoon MRN: [id number] Date of Birth: May 20, 1933  [facility] CM/SW Contact  Luise JAYSON Pan, [place] Phone Number: 09/17/2024, 10:35 AM  Clinical Narrative:   Per [facility], [facility] today. [facility] will notify TOC when one becomes available.   CSW will continue to follow.                      Expected Discharge Plan and Services                                               Social Drivers of Health (SDOH) Interventions SDOH Screenings   Food Insecurity: No Food Insecurity (09/09/2024)  Housing: Low Risk  (09/09/2024)  Transportation Needs: No Transportation Needs (09/09/2024)  Utilities: Not At Risk (09/09/2024)  Depression (PHQ2-9): Low Risk  (06/01/2023)  Financial Resource Strain: Low Risk  (04/06/2024)   Received from [facility]  Physical Activity: Insufficiently Active (07/08/2023)   Received from [facility]  Social Connections: Socially Isolated (09/09/2024)  Stress: No Stress Concern Present (07/08/2023)   Received from [facility]  Tobacco Use: Medium Risk (09/08/2024)    Readmission Risk Interventions    04/20/2023    2:37 PM 04/08/2023   10:49 AM  Readmission Risk Prevention Plan  Transportation Screening Complete Complete  PCP or Specialist Appt within 5-7 Days  Complete  PCP or Specialist Appt within 3-5 Days Complete   Home Care Screening  Complete  Medication Review (RN CM)  Complete  HRI or Home Care Consult Complete   Social Work Consult for Recovery Care Planning/Counseling Complete   Palliative Care Screening Complete   Medication Review Oceanographer) Referral to Pharmacy

## 2024-09-17 NOTE — Discharge Summary (Signed)
 Physician Discharge Summary  Pedro Young FMW:[id number] DOB: 12/04/1932 DOA: 09/08/2024  No changes from my discharge summary, plan for residential hospice  PCP: Pcp, No  Admit date: 09/08/2024 Discharge date: 09/17/2024  Time spent: 45 minutes  Recommendations for Outpatient Follow-up:  Residential hospice for end-of-life care   Discharge Diagnoses:  Principal Problem:   Atrial fibrillation with RVR (HCC) Bronchitis/early pneumonia Adult failure to thrive Encephalopathy   Acute on chronic anemia   ESRD on dialysis (HCC)   HYPERTENSION, BENIGN ESSENTIAL   Subclinical hypothyroidism   CAD (coronary artery disease)   Hyperlipidemia   Anemia of chronic renal failure, stage 4 (severe) (HCC)   Hypotension   Discharge Condition: Guarded  Diet recommendation: Comfort feeds Filed Weights   09/10/24 0822 09/10/24 1139 09/13/24 1423  Weight: 57.8 kg 56.2 kg 56 kg    History of present illness:  89 y.o. male past medical history significant for end-stage renal disease on hemodialysis Tuesday Thursdays and Saturdays, hypertension, BPH comes in for dizziness and palpitation found to be in A-fib with RVR   Hospital Course:    89/M with ESRD, BPH, admitted with fever, A-fib RVR, weakness and failure to thrive. - Treated for bronchitis, elevated procalcitonin with ceftriaxone  and azithromycin  -Noted to have portal vein thrombosis on imaging and questionable lesions in the liver and kidneys -Hospital course complicated by failure to thrive, metabolic encephalopathy, ongoing decline -Dr. Celinda and cardiology team consulted palliative care -Family meeting completed, his daughters decided to stop dialysis and focus on comfort care, plan for residential hospice  Discharge Exam: Vitals:   09/16/24 1934 09/17/24 0731  BP: 128/63 (!) 128/57  Pulse: 84   Resp: 18 18  Temp: 97.8 F (36.6 C) 98 F (36.7 C)  SpO2: 97% 98%   Chronically ill frail male, AO x 2 CVS: S1-S2, irregular  rhythm Lungs: Decreased breath sounds to bases Abdomen: Soft, nontender, bowel sounds present Extremities: No edema  Discharge Instructions    Allergies as of 09/17/2024       Reactions   Ranexa  [ranolazine  Er] Other (See Comments)   Myclonus, hallucinations   Atorvastatin    REACTION: arthralgia Other Reaction(s): Unknown   Lovastatin    REACTION: arthralgia Other Reaction(s): Unknown   Amoxicillin-pot Clavulanate Other (See Comments)   Did it involve swelling of the face/tongue/throat, SOB, or low BP? Unknown Did it involve sudden or severe rash/hives, skin peeling, or any reaction on the inside of your mouth or nose? Unknown Did you need to seek medical attention at a hospital or doctor's office? Unknown When did it last happen? patient is unfamiliar with this allergy   If all above answers are NO, may proceed with cephalosporin use.        Medication List     STOP taking these medications    aspirin  EC 81 MG tablet   colchicine  0.6 MG tablet   diclofenac Sodium 1 % Gel Commonly known as: VOLTAREN   isosorbide  mononitrate 60 MG 24 hr tablet Commonly known as: IMDUR    Lokelma 10 g Pack packet Generic drug: sodium zirconium cyclosilicate   metoprolol  tartrate 25 MG tablet Commonly known as: LOPRESSOR    nitroGLYCERIN  0.4 MG SL tablet Commonly known as: NITROSTAT    rosuvastatin  10 MG tablet Commonly known as: CRESTOR    rosuvastatin  40 MG tablet Commonly known as: CRESTOR    sevelamer 800 MG tablet Commonly known as: RENAGEL   tamsulosin  0.4 MG Caps capsule Commonly known as: FLOMAX        TAKE  these medications    acetaminophen  325 MG tablet Commonly known as: TYLENOL  Take 2 tablets (650 mg total) by mouth every 6 (six) hours as needed for mild pain (pain score 1-3) (or Fever >/= 101). What changed:  when to take this reasons to take this   HYDROcodone-acetaminophen  5-325 MG tablet Commonly known as: NORCO/VICODIN Take 1-2 tablets by  mouth every 4 (four) hours as needed for moderate pain (pain score 4-6).   mirtazapine  15 MG tablet Commonly known as: REMERON  Take 1 tablet (15 mg total) by mouth daily. What changed: Another medication with the same name was removed. Continue taking this medication, and follow the directions you see here.       Allergies  Allergen Reactions   Ranexa  [Ranolazine  Er] Other (See Comments)    Myclonus, hallucinations   Atorvastatin     REACTION: arthralgia  Other Reaction(s): Unknown   Lovastatin     REACTION: arthralgia  Other Reaction(s): Unknown   Amoxicillin-Pot Clavulanate Other (See Comments)    Did it involve swelling of the face/tongue/throat, SOB, or low BP? Unknown Did it involve sudden or severe rash/hives, skin peeling, or any reaction on the inside of your mouth or nose? Unknown Did you need to seek medical attention at a hospital or doctor's office? Unknown When did it last happen? patient is unfamiliar with this allergy   If all above answers are NO, may proceed with cephalosporin use.       The results of significant diagnostics from this hospitalization (including imaging, microbiology, ancillary and laboratory) are listed below for reference.    Significant Diagnostic Studies: MR ABDOMEN W WO CONTRAST Result Date: 09/13/2024 EXAM: MRCP WITH AND WITHOUT IV CONTRAST 09/13/2024 09:21:11 AM TECHNIQUE: Multisequence, multiplanar magnetic resonance images of the abdomen with and without intravenous contrast. MRCP sequences were performed. CONTRAST: 5.5 mL of Gadavist. COMPARISON: MR Abdomen 06/06/2014. Abdominopelvic CT of 09/12/2024. CLINICAL HISTORY: Acute abdominal pain, occlusive thrombus in the right portal vein and nonocclusive thrombus in the bifurcation of the left and right portal veins and small volume nonocclusive thrombus in the proximal left portal venous anatomy. FINDINGS: LIMITATIONS: Mild motion degradation throughout. LIVER: Iron deposition in the  liver is evidenced by decreased signal on T2 and long T1 weighted imaging. Scattered hepatic cysts. Again identified is occlusive thrombus throughout the right portal vein and its branches. Minimal extension of thrombus into the proximalmost left portal vein. Example images 50 and 46 of series 901 respectively. Main portal vein is patent. GALLBLADDER AND BILIARY SYSTEM: Cholecystectomy. No biliary duct dilatation. SPLEEN: Iron deposition in the spleen is evidenced by decreased signal on T2 and long T1 weighted imaging. PANCREAS/PANCREATIC DUCT: Tiny cystic foci within the pancreatic body including image 23/4 are likely areas of side branch duct ectasia. No acute inflammation. ADRENAL GLANDS: Normal adrenal glands. KIDNEYS: Atrophic kidneys bilaterally. Multiple nonenhancing renal lesions bilaterally; many lesions demonstrate precontrast hyperintensity on series 900, consistent with hemorrhagic/proteinaceous components. LYMPH NODES: No enlarged abdominal lymph nodes. VASCULATURE: Aortic atherosclerosis. Portal vein thrombus as detailed under LIVER. PERITONEUM: No ascites. ABDOMINAL WALL: No hernia. BOWEL: Grossly unremarkable. No bowel obstruction. BONES: No acute abnormality or worrisome osseous lesion. SOFT TISSUES: Unremarkable. MISCELLANEOUS: Small bilateral pleural effusions with bibasilar atelectasis. IMPRESSION: 1. Motion degraded exam. 2. Redemonstration of right greater than left portal vein thrombus, as detailed above. 3. Haemosiderosis. 4. Findings which are likely related to chronic pancreatitis and are of doubtful clinical significance, given patient age and comorbidities. 5. small bilateral pleural effusions. Electronically signed  by: Rockey Kilts MD 09/13/2024 11:55 AM EST RP Workstation: [id number]   CT ABDOMEN PELVIS W CONTRAST Addendum Date: 09/12/2024 ADDENDUM REPORT: 09/12/2024 07:40 ADDENDUM: Critical Value/emergent results were called by telephone at the time of interpretation on 09/12/2024  at 7:40 am to provider PAULINE DIBIA , who verbally acknowledged these results. Electronically Signed   By: Camellia Candle M.D.   On: 09/12/2024 07:40   Result Date: 09/12/2024 CLINICAL DATA:  Abdominal pain. EXAM: CT ABDOMEN AND PELVIS WITH CONTRAST TECHNIQUE: Multidetector CT imaging of the abdomen and pelvis was performed using the standard protocol following bolus administration of intravenous contrast. RADIATION DOSE REDUCTION: This exam was performed according to the departmental dose-optimization program which includes automated exposure control, adjustment of the mA and/or kV according to patient size and/or use of iterative reconstruction technique. CONTRAST:  75mL OMNIPAQUE  IOHEXOL  350 MG/ML SOLN COMPARISON:  09/27/2021 FINDINGS: Lower chest: Interval progression of bilateral calcified pleural plaques in the 3 year interval since prior study. Dependent atelectasis/scarring is progressive since prior. Hepatobiliary: Occlusive thrombus noted in the right portal vein with nonocclusive thrombus in the bifurcation of the left and right portal veins in small volume nonocclusive thrombus in the proximal left portal venous anatomy. Pneumobilia suggests prior sphincterotomy. Multiple scattered tiny well-defined hypodensities in the liver parenchyma are too small to characterize but are likely cysts. There are clustered areas of ill-defined low-density in the periphery of the right hepatic lobe, indeterminate. Gallbladder surgically absent. Pancreas: Gas in the main pancreatic duct likely due to prior sphincterotomy. Spleen: No splenomegaly. No suspicious focal mass lesion. Adrenals/Urinary Tract: No adrenal nodule or mass. Kidneys are atrophic. Multiple low-density renal lesions bilaterally are compatible with cysts of varying size and complexity. 8 mm lesion lower pole right kidney on 45/3 may enhance raising the question of a tiny renal cell carcinoma. No evidence for hydroureter. Bladder wall trabeculation  with multiple bladder wall diverticuli suggests underlying component of bladder outlet obstruction. Stomach/Bowel: Moderate distention of the stomach. Wall thickening is noted in the region of the gastric pylorus. Duodenal bulb is patulous. Multiple duodenal diverticuli evident. No small bowel obstruction. The terminal ileum is normal. The appendix is not well visualized, but there is no edema or inflammation in the region of the cecal tip to suggest appendicitis. Trace fluid is noted around the cecum (coronal 47/6). Diffuse diverticular disease noted in the colon with marked wall thickening in the sigmoid segment no overt pericolonic edema inflammation to suggest acute diverticulitis. Vascular/Lymphatic: See above for description of portal vein thrombus. There is advanced atherosclerotic calcification of the abdominal aorta without aneurysm. Upper normal lymph nodes are seen in the hepatoduodenal ligament, likely reactive. No retroperitoneal lymphadenopathy. No pelvic lymphadenopathy. Reproductive: Prostate gland is heterogeneous and generates mass-effect on the bladder base suggesting median lobe hypertrophy. Other: No substantial intraperitoneal free fluid. Musculoskeletal: No worrisome lytic or sclerotic osseous abnormality. IMPRESSION: 1. Occlusive thrombus in the right portal vein and nonocclusive thrombus in the bifurcation of the left and right portal veins and small volume nonocclusive thrombus in the proximal left portal venous anatomy. Main portal vein is patent. 2. Clustered areas of ill-defined low-density in the periphery of the right hepatic lobe, indeterminate. MRI of the abdomen with and without contrast recommended to further evaluate and exclude underlying infiltrative mass lesion. 3. 8 mm lesion lower pole right kidney may enhance raising the question of a tiny renal cell carcinoma. Attention on follow-up MRI recommended. 4. Diffuse diverticular disease in the colon with marked wall thickening in  the sigmoid segment. No overt pericolonic edema inflammation to suggest acute diverticulitis. 5. Bladder wall trabeculation with multiple bladder wall diverticuli suggests underlying component of bladder outlet obstruction. 6. Interval progression of bilateral calcified pleural plaques in the 3 year interval since prior study. 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 7.  Aortic Atherosclerosis (ICD10-I70.0). Electronically Signed: By: Camellia Candle M.D. On: 09/12/2024 07:27   DG Chest 1 View Result Date: 09/10/2024 EXAM: 1 VIEW(S) XRAY OF THE CHEST 09/10/2024 12:05:28 AM COMPARISON: 09/08/2024 CLINICAL HISTORY: Pneumonia FINDINGS: LUNGS AND PLEURA: No focal pulmonary opacity. No pulmonary edema. No pleural effusion. No pneumothorax. Persistent elevated right hemidiaphragm. Bilateral calcified pleural plaques, unchanged. HEART AND MEDIASTINUM: Aortic atherosclerosis. BONES AND SOFT TISSUES: No acute osseous abnormality. IMPRESSION: 1. No acute cardiopulmonary findings. 2. Bilateral calcified pleural plaques, unchanged. Electronically signed by: Norman Gatlin MD 09/10/2024 12:29 AM EST RP Workstation: [id number]   ECHOCARDIOGRAM COMPLETE Result Date: 09/09/2024    ECHOCARDIOGRAM REPORT   Patient Name:   Pedro Young Date of Exam: 09/09/2024 Medical Rec #:  [id number]       Height:       66.0 in Accession #:    [id number]      Weight:       150.0 lb Date of Birth:  09-02-1933        BSA:          1.770 m Patient Age:    89 years        BP:           117/49 mmHg Patient Gender: M               HR:           86 bpm. Exam Location:  Inpatient Procedure: 2D Echo, Cardiac Doppler and Color Doppler (Both Spectral and Color            Flow Doppler were utilized during procedure). Indications:    Atrial Fibrillation I48.91  History:        Patient has prior history of Echocardiogram examinations, most                 recent 08/01/2020. CAD, Arrythmias:hx of AFIB; Risk                  Factors:Hypertension and Former Smoker.  Sonographer:    Koleen Popper RDCS Referring Phys: [id number] ANASTASSIA DOUTOVA IMPRESSIONS  1. Left ventricular ejection fraction, by estimation, is 60 to 65%. The left ventricle has normal function. The left ventricle has no regional wall motion abnormalities. There is mild left ventricular hypertrophy of the basal-septal segment. Left ventricular diastolic parameters are indeterminate.  2. Right ventricular systolic function is normal. The right ventricular size is normal. There is mildly elevated pulmonary artery systolic pressure. The estimated right ventricular systolic pressure is 39.1 mmHg.  3. Small echogenic density noted on the posterior mitral valve leaflet. Noted on apical 4 chamber (image 43 and 44) and apical 2 chamber (image 75 and 76) on the atrial side. The differential diagnosis includes reverberation artifact from MAC, leaflet calcification, healed thrombus/vegetation, etc. This was not present on prior transthoracic echocardiogram from September 2021. Clinical correlation required.. The mitral valve is degenerative. Mild mitral valve regurgitation. No evidence of mitral stenosis.  4. The aortic valve is tricuspid. Aortic valve regurgitation is not visualized. Aortic valve sclerosis is present, with no evidence of aortic valve stenosis.  5. The inferior vena cava is normal in size with <50% respiratory variability,  suggesting right atrial pressure of 8 mmHg. Comparison(s): A prior study was performed on 08/01/2020. Prior images reviewed side by side. LVEF 66%, grade 1 diastolic dysfunction, mild LAE, mild MR, moderate TR, estimated RAP 8 mmHg. Conclusion(s)/Recommendation(s): Consider transesophageal echocardiogram to further evaluate the mitral valve, clinical correlation required. See above. FINDINGS  Left Ventricle: Left ventricular ejection fraction, by estimation, is 60 to 65%. The left ventricle has normal function. The left ventricle has no regional  wall motion abnormalities. The left ventricular internal cavity size was normal in size. There is  mild left ventricular hypertrophy of the basal-septal segment. Left ventricular diastolic parameters are indeterminate. Right Ventricle: The right ventricular size is normal. No increase in right ventricular wall thickness. Right ventricular systolic function is normal. There is mildly elevated pulmonary artery systolic pressure. The tricuspid regurgitant velocity is 2.79  m/s, and with an assumed right atrial pressure of 8 mmHg, the estimated right ventricular systolic pressure is 39.1 mmHg. Left Atrium: Left atrial size was normal in size. Right Atrium: Right atrial size was normal in size. Pericardium: There is no evidence of pericardial effusion. Mitral Valve: Small echogenic density noted on the posterior mitral valve leaflet. Noted on apical 4 chamber (image 43 and 44) and apical 2 chamber (image 75 and 76) on the atrial side. The differential diagnosis includes reverberation artifact from MAC,  leaflet calcification, healed thrombus/vegetation, etc. This was not present on prior transthoracic echocardiogram from September 2021. Clinical correlation required. The mitral valve is degenerative in appearance. There is mild thickening of the mitral  valve leaflet(s). There is mild calcification of the mitral valve leaflet(s). Mild to moderate mitral annular calcification. Mild mitral valve regurgitation. No evidence of mitral valve stenosis. Tricuspid Valve: The tricuspid valve is normal in structure. Tricuspid valve regurgitation is mild . No evidence of tricuspid stenosis. Aortic Valve: The aortic valve is tricuspid. Aortic valve regurgitation is not visualized. Aortic valve sclerosis is present, with no evidence of aortic valve stenosis. Pulmonic Valve: The pulmonic valve was not well visualized. Pulmonic valve regurgitation is not visualized. Aorta: The aortic root and ascending aorta are structurally normal,  with no evidence of dilitation. Venous: The inferior vena cava is normal in size with less than 50% respiratory variability, suggesting right atrial pressure of 8 mmHg. IAS/Shunts: The atrial septum is grossly normal.  LEFT VENTRICLE PLAX 2D LVIDd:         4.10 cm      Diastology LVIDs:         2.90 cm      LV e' medial:    8.05 cm/s LV PW:         1.10 cm      LV E/e' medial:  11.1 LV IVS:        1.40 cm      LV e' lateral:   8.92 cm/s LVOT diam:     1.80 cm      LV E/e' lateral: 10.0 LV SV:         53 LV SV Index:   30 LVOT Area:     2.54 cm  LV Volumes (MOD) LV vol d, MOD A4C: 121.0 ml LV vol s, MOD A4C: 57.8 ml LV SV MOD A4C:     121.0 ml RIGHT VENTRICLE             IVC RV Basal diam:  3.31 cm     IVC diam: 2.04 cm RV S prime:     23.60 cm/s TAPSE (M-mode):  2.2 cm LEFT ATRIUM             Index        RIGHT ATRIUM           Index LA diam:        3.72 cm 2.10 cm/m   RA Area:     13.10 cm LA Vol (A2C):   34.0 ml 19.21 ml/m  RA Volume:   25.00 ml  14.13 ml/m LA Vol (A4C):   27.6 ml 15.60 ml/m LA Biplane Vol: 31.9 ml 18.03 ml/m  AORTIC VALVE LVOT Vmax:   120.00 cm/s LVOT Vmean:  80.300 cm/s LVOT VTI:    0.208 m  AORTA Ao Root diam: 3.42 cm Ao Asc diam:  3.60 cm MITRAL VALVE                TRICUSPID VALVE MV Area (PHT): 3.99 cm     TR Peak grad:   31.1 mmHg MV Decel Time: 190 msec     TR Vmax:        279.00 cm/s MR Peak grad: 65.9 mmHg MR Vmax:      406.00 cm/s   SHUNTS MV E velocity: 89.10 cm/s   Systemic VTI:  0.21 m MV A velocity: 105.00 cm/s  Systemic Diam: 1.80 cm MV E/A ratio:  0.85 Sunit Tolia Electronically signed by Madonna Large Signature Date/Time: 09/09/2024/12:48:50 PM    Final    CT Angio Chest PE W and/or Wo Contrast Result Date: 09/08/2024 EXAM: CTA of the Chest with contrast for PE 09/08/2024 08:53:33 PM TECHNIQUE: CTA of the chest was performed after the administration of 75 mL of iohexol  (OMNIPAQUE ) 350 MG/ML injection. Multiplanar reformatted images are provided for review. MIP images are  provided for review. Automated exposure control, iterative reconstruction, and/or weight based adjustment of the mA/kV was utilized to reduce the radiation dose to as low as reasonably achievable. COMPARISON: Comparison with same day chest x-ray and CT 3/ 19 / 11. CLINICAL HISTORY: Pulmonary embolism (PE) suspected, high prob. Including shortness of breath and new atrial fibrillation in the clinical indication. FINDINGS: PULMONARY ARTERIES: Pulmonary arteries are adequately opacified for evaluation. No pulmonary embolism. Main pulmonary artery is normal in caliber. MEDIASTINUM: The heart demonstrates coronary artery atherosclerotic calcification. The pericardium demonstrates no acute abnormality. There is aortic atherosclerotic calcification. LYMPH NODES: No mediastinal, hilar or axillary lymphadenopathy. LUNGS AND PLEURA: Diffuse bronchial wall thickening. Lower lung atelectasis and scarring. Bilateral pleural thickening and calcified pleural plaques greatest in the posterior lower lobes. No focal consolidation or pulmonary edema. No pleural effusion or pneumothorax. UPPER ABDOMEN: Pneumobilia. Cholecystectomy. Small hiatal hernia. SOFT TISSUES AND BONES: No acute bone or soft tissue abnormality. IMPRESSION: 1. No pulmonary embolism. 2. Diffuse bronchial wall thickening compatible with airway infection or inflammation. 3. 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 Electronically signed by: Norman Gatlin MD 09/08/2024 09:05 PM EST RP Workstation: [id number]   DG Chest Port 1 View Result Date: 09/08/2024 EXAM: 1 VIEW(S) XRAY OF THE CHEST 09/08/2024 05:18:00 PM COMPARISON: None available. CLINICAL HISTORY: sob, new afib FINDINGS: LUNGS AND PLEURA: Low lung volumes. Right lower lung zone calcified granuloma. Left chest calcified pleural plaques have progressed from prior. No pulmonary edema. No pleural effusion. No pneumothorax. HEART AND MEDIASTINUM: Atherosclerotic plaque. No acute abnormality of the  cardiac and mediastinal silhouettes. BONES AND SOFT TISSUES: No acute osseous abnormality. IMPRESSION: 1. No acute cardiopulmonary process identified. 2. Left chest calcified pleural plaques have progressed from prior. Electronically signed by: Norman  Stutzman MD 09/08/2024 05:33 PM EST RP Workstation: [id number]    Microbiology: Recent Results (from the past 240 hours)  Resp panel by RT-PCR (RSV, Flu A&B, Covid) Anterior Nasal Swab     Status: None   Collection Time: 09/10/24 12:16 AM   Specimen: Anterior Nasal Swab  Result Value Ref Range Status   SARS Coronavirus 2 by RT PCR NEGATIVE NEGATIVE Final   Influenza A by PCR NEGATIVE NEGATIVE Final   Influenza B by PCR NEGATIVE NEGATIVE Final    Comment: (NOTE) The Xpert Xpress SARS-CoV-2/FLU/RSV plus assay is intended as an aid in the diagnosis of influenza from Nasopharyngeal swab specimens and should not be used as a sole basis for treatment. Nasal washings and aspirates are unacceptable for Xpert Xpress SARS-CoV-2/FLU/RSV testing.  Fact Sheet for Patients: bloggercourse.com  Fact Sheet for Healthcare Providers: seriousbroker.it  This test is not yet approved or cleared by the [place]  FDA and has been authorized for detection and/or diagnosis of SARS-CoV-2 by FDA under an Emergency Use Authorization (EUA). This EUA will remain in effect (meaning this test can be used) for the duration of the COVID-19 declaration under Section 564(b)(1) of the Act, 21 U.S.C. section 360bbb-3(b)(1), unless the authorization is terminated or revoked.     Resp Syncytial Virus by PCR NEGATIVE NEGATIVE Final    Comment: (NOTE) Fact Sheet for Patients: bloggercourse.com  Fact Sheet for Healthcare Providers: seriousbroker.it  This test is not yet approved or cleared by the [place]  FDA and has been authorized for detection and/or diagnosis of  SARS-CoV-2 by FDA under an Emergency Use Authorization (EUA). This EUA will remain in effect (meaning this test can be used) for the duration of the COVID-19 declaration under Section 564(b)(1) of the Act, 21 U.S.C. section 360bbb-3(b)(1), unless the authorization is terminated or revoked.  Performed at [facility], [address] [place], [place] [address]   Culture, blood (Routine X 2) w Reflex to ID Panel     Status: None   Collection Time: 09/10/24  5:19 AM   Specimen: BLOOD RIGHT HAND  Result Value Ref Range Status   Specimen Description BLOOD RIGHT HAND  Final   Special Requests   Final    BOTTLES DRAWN AEROBIC ONLY Blood Culture adequate volume   Culture   Final    NO GROWTH 5 DAYS Performed at [facility], [address] [place], [place] [address]    Report Status 09/15/2024 FINAL  Final  Culture, blood (Routine X 2) w Reflex to ID Panel     Status: None   Collection Time: 09/10/24  7:04 AM   Specimen: BLOOD RIGHT ARM  Result Value Ref Range Status   Specimen Description BLOOD RIGHT ARM  Final   Special Requests   Final    BOTTLES DRAWN AEROBIC AND ANAEROBIC Blood Culture results may not be optimal due to an inadequate volume of blood received in culture bottles   Culture   Final    NO GROWTH 5 DAYS Performed at [facility], [address], [place], [place] [address]    Report Status 09/15/2024 FINAL  Final  MRSA Next Gen by PCR, Nasal     Status: None   Collection Time: 09/10/24  2:53 PM   Specimen: Nasal Mucosa; Nasal Swab  Result Value Ref Range Status   MRSA by PCR Next Gen NOT DETECTED NOT DETECTED Final    Comment: (NOTE) The GeneXpert MRSA Assay (FDA approved for NASAL specimens  only), is one component of a comprehensive MRSA colonization surveillance program. It is not intended to diagnose MRSA infection nor to guide or monitor treatment for MRSA infections. Test performance is not FDA approved in patients less than 34  years old. Performed at [facility], [address] [place], [place] [address]      Labs: Basic Metabolic Panel: Recent Labs  Lab 09/11/24 1249 09/13/24 0453  NA 134* 133*  K 4.1 4.0  CL 94* 92*  CO2 27 25  GLUCOSE 147* 94  BUN 48* 85*  CREATININE 6.02* 8.03*  CALCIUM  7.6* 7.9*  MG  --  2.3  PHOS  --  3.4   Liver Function Tests: Recent Labs  Lab 09/11/24 1249 09/13/24 0453  AST 50*  --   ALT 37  --   ALKPHOS 298*  --   BILITOT 1.3*  --   PROT 5.4*  --   ALBUMIN 1.7* 1.6*   No results for input(s): LIPASE, AMYLASE in the last 168 hours. No results for input(s): AMMONIA in the last 168 hours. CBC: Recent Labs  Lab 09/11/24 0540 09/11/24 1249 09/12/24 1036 09/13/24 0453  WBC 11.2* 11.4* 12.2* 12.7*  HGB 8.4* 8.3* 7.9* 8.1*  HCT 24.1* 23.9* 22.6* 22.9*  MCV 92.7 92.6 91.1 89.8  PLT 138* 144* 165 211   Cardiac Enzymes: No results for input(s): CKTOTAL, CKMB, CKMBINDEX, TROPONINI in the last 168 hours. BNP: BNP (last 3 results) Recent Labs    09/08/24 1603  BNP 270.8*    ProBNP (last 3 results) No results for input(s): PROBNP in the last 8760 hours.  CBG: No results for input(s): GLUCAP in the last 168 hours.      Signed:  Sigurd Pac MD.  [facility] 09/17/2024, 10:59 AM

## 2024-09-18 DIAGNOSIS — Z789 Other specified health status: Secondary | ICD-10-CM | POA: Diagnosis not present

## 2024-09-18 DIAGNOSIS — Z515 Encounter for palliative care: Secondary | ICD-10-CM | POA: Diagnosis not present

## 2024-09-18 DIAGNOSIS — I4891 Unspecified atrial fibrillation: Secondary | ICD-10-CM | POA: Diagnosis not present

## 2024-09-18 DIAGNOSIS — Z66 Do not resuscitate: Secondary | ICD-10-CM | POA: Diagnosis not present

## 2024-09-18 NOTE — TOC Progression Note (Addendum)
 Transition of Care [facility]) - Progression Note    Patient Details  Name: Pedro Young MRN: [id number] Date of Birth: Jun 03, 1933  [facility]) CM/SW Contact  [name] Public, LCSWA Phone Number: 09/18/2024, 9:21 AM  Clinical Narrative:     CSW messaged Nat SQUIBB. with [facility] to check on bed availability today at [facility] place. CSW awaiting response. CSW will continue to follow.  Update- Nat with [facility] informed CSW that [facility] does not have any bed availability this morning. Nat informed CSW that if anything changes she will let CSW know. CSW updated patients daughter [name].                    Expected Discharge Plan and Services                                               Social Drivers of Health (SDOH) Interventions SDOH Screenings   Food Insecurity: No Food Insecurity (09/09/2024)  Housing: Low Risk  (09/09/2024)  Transportation Needs: No Transportation Needs (09/09/2024)  Utilities: Not At Risk (09/09/2024)  Depression (PHQ2-9): Low Risk  (06/01/2023)  Financial Resource Strain: Low Risk  (04/06/2024)   Received from [facility]  Physical Activity: Insufficiently Active (07/08/2023)   Received from [facility]  Social Connections: Socially Isolated (09/09/2024)  Stress: No Stress Concern Present (07/08/2023)   Received from [facility]  Tobacco Use: Medium Risk (09/08/2024)    Readmission Risk Interventions    04/20/2023    2:37 PM 04/08/2023   10:49 AM  Readmission Risk Prevention Plan  Transportation Screening Complete Complete  PCP or Specialist Appt within 5-7 Days  Complete  PCP or Specialist Appt within 3-5 Days Complete   Home Care Screening  Complete  Medication Review (RN CM)  Complete  HRI or Home Care Consult Complete   Social Work Consult for Recovery Care Planning/Counseling Complete   Palliative Care Screening Complete   Medication Review Oceanographer) Referral to Pharmacy

## 2024-09-18 NOTE — Progress Notes (Signed)
 Daily Progress Note   Patient Name: Pedro Young       Date: 09/18/2024 DOB: [birth year]  Age: 89 y.o. MRN#: [id number] Attending Physician: Tobie Yetta HERO, MD Primary Care Physician: Pcp, No Admit Date: 09/08/2024  Reason for Consultation/Follow-up: Non pain symptom management, Pain control, Psychosocial/spiritual support, and Terminal Care  Subjective: I have reviewed medical records including:  EPIC notes - Nursing, hospitalist, TOC, hospice MAR - As needed medications administered in the last 24 hours: None.   Received report from primary RN -no acute concerns.  Went to visit patient at bedside-1 family member present.  Patient was lying in bed awake and alert.No signs or non-verbal gestures of pain or discomfort noted. No respiratory distress, increased work of breathing, or secretions noted.  Patient denies pain.  Therapeutic listening provided as family reflects on the several good days patient has had and meaningful time with family.  They are wondering if they can get a wheelchair to wheel patient outside.  Patient loves being outside.  Family wonder if patient is getting better.  Natural trajectory at end-of-life reviewed in context of focusing on quality of life.  Still waiting for [facility] bed - family understand.  All questions and concerns addressed. Encouraged to call with questions and/or concerns. PMT card previously provided.  Discussed with RN getting wheelchair and allowing family to take patient outside.    Length of Stay: 9  Current Medications: Scheduled Meds:   amiodarone  200 mg Oral BID   antiseptic oral rinse  15 mL Topical BID   sodium chloride  flush  3 mL Intravenous Q12H   tamsulosin   0.4 mg Oral Daily    Continuous Infusions:   PRN  Meds: acetaminophen  **OR** acetaminophen , artificial tears, diphenhydrAMINE, feeding supplement (NEPRO CARB STEADY), glycopyrrolate **OR** glycopyrrolate **OR** glycopyrrolate, haloperidol  **OR** haloperidol  **OR** haloperidol  lactate, HYDROcodone-acetaminophen , HYDROmorphone (DILAUDID) injection, LORazepam **OR** LORazepam **OR** LORazepam, metoprolol  tartrate, ondansetron  **OR** ondansetron  (ZOFRAN ) IV, sodium chloride  flush  Physical Exam Vitals and nursing note reviewed.  Constitutional:      General: He is not in acute distress.    Appearance: He is ill-appearing.     Comments: frail  Pulmonary:     Effort: No respiratory distress.  Skin:    General: Skin is warm and dry.  Neurological:     Mental Status: He is  alert.     Motor: Weakness present.  Psychiatric:        Behavior: Behavior is cooperative.             Vital Signs: BP (!) 133/56 (BP Location: Right Arm)   Pulse 83   Temp 97.7 F (36.5 C) (Oral)   Resp 18   Ht 5' 6 (1.676 m)   Wt 56 kg   SpO2 100%   BMI 19.93 kg/m  SpO2: SpO2: 100 % O2 Device: O2 Device: Room Air O2 Flow Rate: O2 Flow Rate (L/min): 2 L/min  Intake/output summary:  Intake/Output Summary (Last 24 hours) at 09/18/2024 0845 Last data filed at 09/17/2024 1104 Gross per 24 hour  Intake --  Output 50 ml  Net -50 ml   LBM: Last BM Date : 09/16/24 Baseline Weight: Weight: 68 kg Most recent weight: Weight: 56 kg       Palliative Assessment/Data: PPS 20%      Patient Active Problem List   Diagnosis Date Noted   Hypotension 09/09/2024   Atrial fibrillation with RVR (HCC) 09/08/2024   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   ESRD on dialysis (HCC) 04/18/2023   BPH (benign prostatic hyperplasia) 04/18/2023   Acute on chronic anemia 04/18/2023   Protein-calorie malnutrition, severe 04/07/2023   Hyponatremia 04/06/2023   CAD (coronary artery disease) 04/06/2023   Metabolic acidosis 04/06/2023   Subclinical hypothyroidism 05/25/2022   Angina pectoris  06/19/2020   Hyperlipidemia 05/16/2013   Anemia of chronic renal failure, stage 4 (severe) (HCC) 01/18/2010   Liver abscess via hepatic artery 2011, 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 male with past medical history of ESRD on TThS, HLD, HTN, CAD, BPH was admitted on 09/08/2024 with atrial fibrillation with RVR, acute on chronic anemia, hypotension. Hospital course has been complicated by sepsis secondary to pneumonia and hepatic and renal lesions noted on abdominal CT.   Assessment: Principal Problem:   Atrial fibrillation with RVR (HCC) Active Problems:   Anemia of chronic renal failure, stage 4 (severe) (HCC)   HYPERTENSION, BENIGN ESSENTIAL   CAD (coronary artery disease)   ESRD on dialysis (HCC)   Hyperlipidemia   Subclinical hypothyroidism   Acute on chronic anemia   Hypotension   Terminal care  Recommendations/Plan: Continue full comfort measures Continue DNR/DNI as previously documented Transfer to [facility] when bed available Continue current comfort focused medication regimen as noted below-no changes No further HD PMT will continue to follow and support holistically    Symptom Management Dilaudid PRN pain/dyspnea/increased work of breathing/RR>25 Norco PRN moderate pain Tylenol  PRN pain/fever Biotin twice daily Benadryl PRN itching Robinul PRN secretions Haldol  PRN agitation/delirium Ativan PRN anxiety/seizure/sleep/distress Zofran  PRN nausea/vomiting Liquifilm Tears PRN dry eye Continue amiodarone, metoprolol , and Flomax  while tolerating p.o.'s  Goals of Care and Additional Recommendations: Limitations on Scope of Treatment: Full Comfort  Care  Code Status:    Code Status Orders  (From admission, onward)           Start     Ordered   09/14/24 0941  Do not attempt resuscitation (DNR) - Comfort care  Continuous       Question Answer Comment  If patient has no pulse  and is not breathing Do Not Attempt Resuscitation   In Pre-Arrest Conditions (Patient Is Breathing and Has a Pulse) Provide comfort measures. Relieve any mechanical airway obstruction. Avoid transfer unless required for comfort.   Consent: Discussion documented in EHR or advanced directives reviewed      09/14/24 0943           Code Status History     Date Active Date Inactive Code Status Order ID Comments User Context   09/09/2024 0023 09/14/2024 0943 Full Code [id number]  Silvester Ales, MD ED   04/18/2023 1715 04/21/2023 2124 DNR [id number]  Waddell Rake, MD ED   04/18/2023 1703 04/18/2023 1715 DNR [id number]  Waddell Rake, MD ED   04/06/2023 2028 04/10/2023 1830 Full Code [id number]  Charlton Evalene RAMAN, MD Inpatient   06/19/2020 1320 06/19/2020 2038 Full Code [id number]  Claudene Victory ORN, MD Inpatient   08/08/2019 1553 08/12/2019 1612 Full Code [id number]  Arlice Reichert, MD ED   08/02/2015 1821 08/09/2015 1438 DNR [id number]  Royal Sill, MD Inpatient       Prognosis:  < 2 weeks  Discharge Planning: Hospice facility  Care plan was discussed with primary RN, patient, patient's family  Thank you for allowing the Palliative Medicine Team to assist in the care of this patient.    Jeoffrey CHRISTELLA Claudene, NP  Please contact Palliative Medicine Team phone at [phone] for questions and concerns.   *Portions of this note are a verbal dictation therefore any spelling and/or grammatical errors are due to the Dragon Medical One system interpretation.

## 2024-09-18 NOTE — Plan of Care (Signed)
  Problem: Education: Goal: Knowledge of General Education information will improve Description: Including pain rating scale, medication(s)/side effects and non-pharmacologic comfort measures Outcome: Progressing   Problem: Clinical Measurements: Goal: Will remain free from infection Outcome: Progressing Goal: Respiratory complications will improve Outcome: Progressing   Problem: Activity: Goal: Risk for activity intolerance will decrease Outcome: Not Progressing   Problem: Nutrition: Goal: Adequate nutrition will be maintained Outcome: Not Progressing

## 2024-09-18 NOTE — Progress Notes (Signed)
 [facility] PROGRESS NOTE  Patient: Pedro Young FMW:[id number]   PCP: Pcp, No DOB: [birth year]   DOA: 09/08/2024   DOS: 09/18/2024    Subjective: Pain well-controlled.  No nausea no vomiting.  RN reports some blood in the stool.  Objective:  Vitals:   09/17/24 2004 09/18/24 0726 09/18/24 1202 09/18/24 1658  BP: (!) 133/56 123/69 (!) 132/57 (!) 123/54  Pulse: 83  83 79  Resp: 18  19 18   Temp: 97.7 F (36.5 C) (!) 97.5 F (36.4 C) 98.3 F (36.8 C) 97.8 F (36.6 C)  TempSrc: Oral  Oral Oral  SpO2: 100% 100% 99% 100%  Weight:      Height:       Clear to auscultation. S1-S2 present. Assessment and plan: Continue comfort care. ESRD stop HD.  Author: Yetta Blanch, MD [facility] 09/18/2024 7:20 PM   If 7PM-7AM, please contact night-coverage at [email or web address]

## 2024-09-18 NOTE — Progress Notes (Signed)
 [facility] [phone] [facility] Liaison Note   Received [facility] from [facility] manager for family interest in [facility]. Eligibility confirmed.    Unfortunately, [facility] is unable to offer a bed today. IP CM aware that liaison will follow up tomorrow or sooner if a bed becomes available.    Thank you for allowing us  to participate in this patient's care.   Nat Babe, BSN, RN [employer] [phone]

## 2024-09-19 ENCOUNTER — Ambulatory Visit: Admitting: Cardiovascular Disease

## 2024-09-19 DIAGNOSIS — Z515 Encounter for palliative care: Secondary | ICD-10-CM | POA: Diagnosis not present

## 2024-09-19 DIAGNOSIS — Z7189 Other specified counseling: Secondary | ICD-10-CM | POA: Diagnosis not present

## 2024-09-19 DIAGNOSIS — I4891 Unspecified atrial fibrillation: Secondary | ICD-10-CM | POA: Diagnosis not present

## 2024-09-19 DIAGNOSIS — Z66 Do not resuscitate: Secondary | ICD-10-CM | POA: Diagnosis not present

## 2024-09-19 MED ORDER — HYDROMORPHONE HCL 1 MG/ML PO LIQD: 0.5000 mg | ORAL | Status: DC | PRN

## 2024-09-19 MED ORDER — DIPHENHYDRAMINE HCL 25 MG PO CAPS: 25.0000 mg | ORAL_CAPSULE | ORAL | Status: DC | PRN

## 2024-09-19 NOTE — TOC Progression Note (Signed)
 Transition of Care [facility]) - Progression Note    Patient Details  Name: Pedro Young MRN: [id number] Date of Birth: 04-04-1933  [facility]) CM/SW Contact  Clotilda Brenner, [place] Phone Number: 09/19/2024, 10:11 AM  Clinical Narrative:   CSW spoke with Inocente, hospice liaison with [facility] and confirmed that there are no open beds at [facility] today. ICM will continue to follow.                       Expected Discharge Plan and Services                                               Social Drivers of Health (SDOH) Interventions SDOH Screenings   Food Insecurity: No Food Insecurity (09/09/2024)  Housing: Low Risk  (09/09/2024)  Transportation Needs: No Transportation Needs (09/09/2024)  Utilities: Not At Risk (09/09/2024)  Depression (PHQ2-9): Low Risk  (06/01/2023)  Financial Resource Strain: Low Risk  (04/06/2024)   Received from [facility]  Physical Activity: Insufficiently Active (07/08/2023)   Received from [facility]  Social Connections: Socially Isolated (09/09/2024)  Stress: No Stress Concern Present (07/08/2023)   Received from [facility]  Tobacco Use: Medium Risk (09/08/2024)    Readmission Risk Interventions    04/20/2023    2:37 PM 04/08/2023   10:49 AM  Readmission Risk Prevention Plan  Transportation Screening Complete Complete  PCP or Specialist Appt within 5-7 Days  Complete  PCP or Specialist Appt within 3-5 Days Complete   Home Care Screening  Complete  Medication Review (RN CM)  Complete  HRI or Home Care Consult Complete   Social Work Consult for Recovery Care Planning/Counseling Complete   Palliative Care Screening Complete   Medication Review Oceanographer) Referral to Pharmacy

## 2024-09-19 NOTE — Progress Notes (Signed)
 [facility] PROGRESS NOTE  Patient: Pedro Young FMW:[id number]   PCP: Pcp, No DOB: 05-31-1933   DOA: 09/08/2024   DOS: 09/19/2024    Subjective: No acute complaint.  Minimal oral intake.  Objective:  Vitals:   09/18/24 1202 09/18/24 1658 09/18/24 2054 09/19/24 0849  BP: (!) 132/57 (!) 123/54 (!) 121/57 (!) 128/56  Pulse: 83 79 80 77  Resp: 19 18 18 17   Temp: 98.3 F (36.8 C) 97.8 F (36.6 C) 97.7 F (36.5 C) (!) 97.5 F (36.4 C)  TempSrc: Oral Oral Oral Oral  SpO2: 99% 100% 92% 98%  Weight:      Height:       Clear to auscultation. Bowel sound.  Assessment and plan: ESRD on HD.  Now comfort care.  Awaiting placement.  Author: Yetta Blanch, MD [facility] 09/19/2024 6:29 PM   If 7PM-7AM, please contact night-coverage at [email or web address]

## 2024-09-19 NOTE — Progress Notes (Signed)
 Daily Progress Note   Patient Name: Pedro Young       Date: 09/19/2024 DOB: [birth year]  Age: 89 y.o. MRN#: [id number] Attending Physician: Tobie Yetta HERO, MD Primary Care Physician: Pcp, No Admit Date: 09/08/2024  Reason for Consultation/Follow-up: Non pain symptom management, Pain control, Psychosocial/spiritual support, and Terminal Care  Subjective: I have reviewed medical records including:  EPIC notes - Nursing, hospitalist, TOC, hospice MAR - As needed medications administered in the last 24 hours: None.   Received report from primary RN -no acute concerns.  Went to visit patient at bedside- 7 family member present.  Patient was lying in bed awake and alert.No signs or non-verbal gestures of pain or discomfort noted. No respiratory distress, increased work of breathing, or secretions noted.  Patient denies pain.  Emotional support provided to family.  Therapeutic listening provided as they reflect on the wonderful care they have received at [facility].  They also expressed appreciation that patient was able to go outside for 10 minutes yesterday in the wheelchair.  Family are hopeful to cook patient his favorite foods even if he only takes 2 bites.  Encouraged family to provide foods that bring patient joy even if it is from outside the hospital.  Per family, patient's last IV was removed yesterday as it was no longer functional.  Reviewed that he did not need another IV placed, as comfort medications can be administered via alternate routes -patient and family were appreciative.  Still waiting for [facility] bed - family understand.  All questions and concerns addressed. Encouraged to call with questions and/or concerns. PMT card previously provided.  Length of Stay:  10  Current Medications: Scheduled Meds:   amiodarone  200 mg Oral BID   antiseptic oral rinse  15 mL Topical BID   tamsulosin   0.4 mg Oral Daily    Continuous Infusions:   PRN Meds: acetaminophen  **OR** acetaminophen , artificial tears, diphenhydrAMINE, feeding supplement (NEPRO CARB STEADY), glycopyrrolate **OR** glycopyrrolate **OR** [DISCONTINUED] glycopyrrolate, haloperidol  **OR** haloperidol  **OR** [DISCONTINUED] haloperidol  lactate, HYDROcodone-acetaminophen , HYDROmorphone HCl, LORazepam **OR** LORazepam **OR** [DISCONTINUED] LORazepam, metoprolol  tartrate, ondansetron  **OR** ondansetron  (ZOFRAN ) IV  Physical Exam Vitals and nursing note reviewed.  Constitutional:      General: He is not in acute distress.    Appearance: He is ill-appearing.     Comments: frail  Pulmonary:     Effort: No respiratory distress.  Skin:    General: Skin is warm and dry.  Neurological:     Mental Status: He is alert.     Motor: Weakness present.  Psychiatric:        Behavior: Behavior is cooperative.             Vital Signs: BP (!) 128/56 (BP Location: Right Arm)   Pulse 77   Temp (!) 97.5 F (36.4 C) (Oral)   Resp 17   Ht 5' 6 (1.676 m)   Wt 56 kg   SpO2 98%   BMI 19.93 kg/m  SpO2: SpO2: 98 % O2 Device: O2 Device: Room Air O2 Flow Rate: O2 Flow Rate (L/min): 2 L/min  Intake/output summary:  Intake/Output Summary (Last 24 hours) at 09/19/2024 1412 Last data filed at 09/19/2024 0800 Gross per 24 hour  Intake --  Output 400 ml  Net -400 ml   LBM: Last BM Date : 09/18/24 Baseline Weight: Weight: 68 kg Most recent weight: Weight: 56 kg       Palliative Assessment/Data: PPS 20%      Patient Active Problem List   Diagnosis Date Noted   Hypotension 09/09/2024   Atrial fibrillation with RVR (HCC) 09/08/2024   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   ESRD on dialysis (HCC) 04/18/2023   BPH (benign prostatic hyperplasia) 04/18/2023   Acute on chronic anemia 04/18/2023   Protein-calorie  malnutrition, severe 04/07/2023   Hyponatremia 04/06/2023   CAD (coronary artery disease) 04/06/2023   Metabolic acidosis 04/06/2023   Subclinical hypothyroidism 05/25/2022   Angina pectoris 06/19/2020   Hyperlipidemia 05/16/2013   Anemia of chronic renal failure, stage 4 (severe) (HCC) 01/18/2010   Liver abscess via hepatic artery 2011, 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 male with past medical history of ESRD on TThS, HLD, HTN, CAD, BPH was admitted on 09/08/2024 with atrial fibrillation with RVR, acute on chronic anemia, hypotension. Hospital course has been complicated by sepsis secondary to pneumonia and hepatic and renal lesions noted on abdominal CT.   Assessment: Principal Problem:   Atrial fibrillation with RVR (HCC) Active Problems:   Anemia of chronic renal failure, stage 4 (severe) (HCC)   HYPERTENSION, BENIGN ESSENTIAL   CAD (coronary artery disease)   ESRD on dialysis (HCC)   Hyperlipidemia   Subclinical hypothyroidism   Acute on chronic anemia   Hypotension   Terminal care  Recommendations/Plan: Continue full comfort measures Continue DNR/DNI as previously documented Transfer to [facility] when bed available Continue current comfort focused medication regimen as noted below-no changes, though discontinued intravenous medications as he no longer has IV. Ok to leave out IV No further HD PMT will continue to follow and support holistically  Symptom Management Dilaudid PRN pain/dyspnea/increased work of breathing/RR>25 - switched from IV to liquid Norco PRN moderate pain Tylenol  PRN pain/fever Biotin twice daily Benadryl PRN itching - switched  from IV to PO Robinul PRN secretions Haldol  PRN agitation/delirium Ativan PRN anxiety/seizure/sleep/distress Zofran  PRN nausea/vomiting Liquifilm Tears PRN dry eye Continue amiodarone, metoprolol , and Flomax  while tolerating p.o.'s  Goals of Care and Additional Recommendations: Limitations on Scope of Treatment: Full Comfort Care  Code Status:  Code Status Orders  (From admission, onward)           Start     Ordered   09/14/24 0941  Do not attempt resuscitation (DNR) - Comfort care  Continuous       Question Answer Comment  If patient has no pulse and is not breathing Do Not Attempt Resuscitation   In Pre-Arrest Conditions (Patient Is Breathing and Has a Pulse) Provide comfort measures. Relieve any mechanical airway obstruction. Avoid transfer unless required for comfort.   Consent: Discussion documented in EHR or advanced directives reviewed      09/14/24 0943           Code Status History     Date Active Date Inactive Code Status Order ID Comments User Context   09/09/2024 0023 09/14/2024 0943 Full Code [id number]  Silvester Ales, MD ED   04/18/2023 1715 04/21/2023 2124 DNR [id number]  Waddell Rake, MD ED   04/18/2023 1703 04/18/2023 1715 DNR [id number]  Waddell Rake, MD ED   04/06/2023 2028 04/10/2023 1830 Full Code [id number]  Charlton Evalene RAMAN, MD Inpatient   06/19/2020 1320 06/19/2020 2038 Full Code [id number]  Claudene Victory ORN, MD Inpatient   08/08/2019 1553 08/12/2019 1612 Full Code [id number]  Arlice Reichert, MD ED   08/02/2015 1821 08/09/2015 1438 DNR [id number]  Royal Sill, MD Inpatient       Prognosis:  < 2 weeks  Discharge Planning: Hospice facility  Care plan was discussed with primary RN, patient, patient's family  Thank you for allowing the Palliative Medicine Team to assist in the care of this patient.    Jeoffrey CHRISTELLA Claudene, NP  Please contact Palliative Medicine Team phone at [phone] for questions and concerns.   *Portions of this note are a  verbal dictation therefore any spelling and/or grammatical errors are due to the Dragon Medical One system interpretation.

## 2024-09-19 NOTE — Progress Notes (Signed)
 [facility] [phone] [facility] Liaison Note   Received request from [facility] manager for family interest in [facility] Eligibility confirmed.    Unfortunately, [facility] is unable to offer a bed today. IP CM aware that liaison will follow up tomorrow or sooner if a bed becomes available.    Thank you for allowing us  to participate in this patient's care.   Inocente Jacobs, BSN, RN [employer] [phone]

## 2024-09-20 DIAGNOSIS — I4891 Unspecified atrial fibrillation: Secondary | ICD-10-CM | POA: Diagnosis not present

## 2024-09-20 NOTE — Plan of Care (Signed)
  Problem: Education: Goal: Knowledge of General Education information will improve Description: Including pain rating scale, medication(s)/side effects and non-pharmacologic comfort measures 09/20/2024 0148 by Jorden Suzen LABOR, RN Outcome: Progressing 09/20/2024 0148 by Jorden Suzen LABOR, RN Outcome: Progressing   Problem: Health Behavior/Discharge Planning: Goal: Ability to manage health-related needs will improve Outcome: Progressing   Problem: Clinical Measurements: Goal: Ability to maintain clinical measurements within normal limits will improve Outcome: Progressing Goal: Will remain free from infection Outcome: Progressing Goal: Diagnostic test results will improve Outcome: Progressing Goal: Respiratory complications will improve Outcome: Progressing Goal: Cardiovascular complication will be avoided Outcome: Progressing   Problem: Activity: Goal: Risk for activity intolerance will decrease Outcome: Progressing   Problem: Nutrition: Goal: Adequate nutrition will be maintained Outcome: Progressing   Problem: Coping: Goal: Level of anxiety will decrease Outcome: Progressing   Problem: Elimination: Goal: Will not experience complications related to bowel motility Outcome: Progressing Goal: Will not experience complications related to urinary retention Outcome: Progressing   Problem: Pain Managment: Goal: General experience of comfort will improve and/or be controlled Outcome: Progressing   Problem: Safety: Goal: Ability to remain free from injury will improve Outcome: Progressing   Problem: Skin Integrity: Goal: Risk for impaired skin integrity will decrease Outcome: Progressing   Problem: Education: Goal: Knowledge of the prescribed therapeutic regimen will improve Outcome: Progressing   Problem: Coping: Goal: Ability to identify and develop effective coping behavior will improve Outcome: Progressing   Problem: Clinical Measurements: Goal: Quality of  life will improve Outcome: Progressing   Problem: Respiratory: Goal: Verbalizations of increased ease of respirations will increase Outcome: Progressing   Problem: Role Relationship: Goal: Family's ability to cope with current situation will improve Outcome: Progressing Goal: Ability to verbalize concerns, feelings, and thoughts to partner or family member will improve Outcome: Progressing   Problem: Pain Management: Goal: Satisfaction with pain management regimen will improve Outcome: Progressing

## 2024-09-20 NOTE — Progress Notes (Signed)
 [facility] PROGRESS NOTE  Patient: Raad Clayson FMW:[id number]   PCP: Pcp, No DOB: 02-08-1933   DOA: 09/08/2024   DOS: 09/20/2024    Subjective: No acute complaint.  No nausea no vomiting.  Objective:  Vitals:   09/18/24 2054 09/19/24 0849 09/19/24 2147 09/20/24 0823  BP: (!) 121/57 (!) 128/56 133/61 (!) 120/52  Pulse: 80 77 78 79  Resp: 18 17  16   Temp: 97.7 F (36.5 C) (!) 97.5 F (36.4 C) 97.6 F (36.4 C) (!) 97.5 F (36.4 C)  TempSrc: Oral Oral Oral Oral  SpO2: 92% 98% 98%   Weight:      Height:       Clear to auscultation. S1-S2 present: Assessment and plan: ESRD patient now not on hemodialysis. Currently awaiting placement at hospice. Family wants to continue to look for bed availability at [facility] and does not want to explore other facilities. Also does not want to go home with hospice for now.  I have encouraged them that the patient still has an window of opportunity to go home if they want to go home with hospice.  Author: Yetta Blanch, MD [facility] 09/20/2024 3:51 PM   If 7PM-7AM, please contact night-coverage at [email or web address]

## 2024-09-20 NOTE — TOC Progression Note (Addendum)
 Transition of Care [facility]) - Progression Note    Patient Details  Name: Pedro Young MRN: [id number] Date of Birth: February 18, 1933  Transition of Care [facility]) CM/SW Contact  Luise JAYSON Pan, [place] Phone Number: 09/20/2024, 11:12 AM  Clinical Narrative:   Per Inocente, [facility] liaison, no beds available at [facility] today.   11:33 AM CSW left VM for patients daughter, [name].   1:34 PM [name] called CSW back. CSW inquired if family would like to look into another hospice facility. Per [name], family does not want patient going outside of [place]. Her mother passed away last year and family/mom had a beautiful experience at [facility] and family want the same experience for their father.   CSW will continue to follow.    Expected Discharge Plan: Hospice Medical Facility Barriers to Discharge: Hospice Bed not available               Expected Discharge Plan and Services                                               Social Drivers of Health (SDOH) Interventions SDOH Screenings   Food Insecurity: No Food Insecurity (09/09/2024)  Housing: Low Risk  (09/09/2024)  Transportation Needs: No Transportation Needs (09/09/2024)  Utilities: Not At Risk (09/09/2024)  Depression (PHQ2-9): Low Risk  (06/01/2023)  Financial Resource Strain: Low Risk  (04/06/2024)   Received from [facility]  Physical Activity: Insufficiently Active (07/08/2023)   Received from [facility] [place]  Social Connections: Socially Isolated (09/09/2024)  Stress: No Stress Concern Present (07/08/2023)   Received from [facility]  Tobacco Use: Medium Risk (09/08/2024)    Readmission Risk Interventions    04/20/2023    2:37 PM 04/08/2023   10:49 AM  Readmission Risk Prevention Plan  Transportation Screening Complete Complete  PCP or Specialist Appt within 5-7 Days  Complete  PCP or Specialist Appt within 3-5 Days Complete   Home Care Screening  Complete  Medication Review (RN CM)  Complete  HRI or Home  Care Consult Complete   Social Work Consult for Recovery Care Planning/Counseling Complete   Palliative Care Screening Complete   Medication Review Oceanographer) Referral to Pharmacy

## 2024-09-20 NOTE — Progress Notes (Signed)
 Daily Progress Note   Patient Name: Pedro Young       Date: 09/20/2024 DOB: 13-Apr-1933  Age: 89 y.o. MRN#: [id number] Attending Physician: Tobie Yetta HERO, MD Primary Care Physician: Pcp, No Admit Date: 09/08/2024  Reason for Consultation/Follow-up: Terminal Care  Patient Profile/HPI:  89 y.o. male with past medical history of ESRD on TThS, HLD, HTN, CAD, BPH was admitted on 09/08/2024 with atrial fibrillation with RVR, acute on chronic anemia, hypotension. Hospital course has been complicated by sepsis secondary to pneumonia and hepatic and renal lesions noted on abdominal CT.  Transitioned to comfort measures only 11/12. No further dialysis. Has been accepted for [facility], but await bed availability.   Subjective: Chart reviewed including labs, progress notes, imaging from this and previous encounters.  No prn medications in the last 24 hours.  On eval patient awake, pleasant. No complaints. Family at bedside. They are awaiting bed at [facility].   Review of Systems  All other systems reviewed and are negative.    Physical Exam Vitals and nursing note reviewed.  Constitutional:      Comments: Very thin, frail  Cardiovascular:     Rate and Rhythm: Normal rate.  Pulmonary:     Effort: Pulmonary effort is normal.  Psychiatric:     Comments: pleasant             Vital Signs: BP (!) 120/52 (BP Location: Right Arm)   Pulse 79   Temp (!) 97.5 F (36.4 C) (Oral)   Resp 16   Ht 5' 6 (1.676 m)   Wt 56 kg   SpO2 98%   BMI 19.93 kg/m  SpO2: SpO2: 98 % O2 Device: O2 Device: Room Air O2 Flow Rate: O2 Flow Rate (L/min): 2 L/min  Intake/output summary:  Intake/Output Summary (Last 24 hours) at 09/20/2024 1139 Last data filed at 09/20/2024 0400 Gross per 24 hour  Intake  --  Output 250 ml  Net -250 ml   LBM: Last BM Date : 09/20/24 Baseline Weight: Weight: 68 kg Most recent weight: Weight: 56 kg       Palliative Assessment/Data: PPS:  20%      Patient Active Problem List   Diagnosis Date Noted   Hypotension 09/09/2024   Atrial fibrillation with RVR (HCC) 09/08/2024   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   ESRD  on dialysis (HCC) 04/18/2023   BPH (benign prostatic hyperplasia) 04/18/2023   Acute on chronic anemia 04/18/2023   Protein-calorie malnutrition, severe 04/07/2023   Hyponatremia 04/06/2023   CAD (coronary artery disease) 04/06/2023   Metabolic acidosis 04/06/2023   Subclinical hypothyroidism 05/25/2022   Angina pectoris 06/19/2020   Hyperlipidemia 05/16/2013   Anemia of chronic renal failure, stage 4 (severe) (HCC) 01/18/2010   Liver abscess via hepatic artery 2011, 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 01/18/2010   HEMATURIA, HX OF 01/18/2010   Gastritis and gastroduodenitis 06/27/2009   Calculus of gallbladder and bile duct 06/21/2009   ABDOMINAL PAIN, GENERALIZED 06/19/2009   Pure hyperglyceridemia 06/18/2009   HYPERTENSION, BENIGN ESSENTIAL 06/18/2009    Palliative Care Assessment & Plan    Assessment/Recommendations/Plan  Sepsis d/t pnumonia, hepatic and renal lesions, - comfort measures only, no further dialysis   Code Status:   Code Status: Do not attempt resuscitation (DNR) - Comfort care   Prognosis:  < 2 weeks  Discharge Planning: Hospice facility  Care plan was discussed with family.  Thank you for allowing the Palliative Medicine Team to assist in the care of this patient.  Total time:  Prolonged billing:  Time includes:   Preparing to see the patient (e.g., review of tests) Obtaining and/or reviewing separately obtained history Performing a medically necessary appropriate examination and/or evaluation Counseling and educating the patient/family/caregiver Ordering medications, tests, or procedures Referring and  communicating with other health care professionals (when not reported separately) Documenting clinical information in the electronic or other health record Independently interpreting results (not reported separately) and communicating results to the patient/family/caregiver Care coordination (not reported separately) Clinical documentation  Cassondra Stain, AGNP-C Palliative Medicine   Please contact Palliative Medicine Team phone at [phone] for questions and concerns.

## 2024-09-21 DIAGNOSIS — I4891 Unspecified atrial fibrillation: Secondary | ICD-10-CM | POA: Diagnosis not present

## 2024-09-21 MED ORDER — BISACODYL 10 MG RE SUPP
10.0000 mg | Freq: Once | RECTAL | Status: AC
  Administered 2024-09-21: 10 mg via RECTAL
  Filled 2024-09-21: qty 1

## 2024-09-21 MED ORDER — SENNOSIDES-DOCUSATE SODIUM 8.6-50 MG PO TABS
2.0000 | ORAL_TABLET | Freq: Two times a day (BID) | ORAL | Status: DC
  Administered 2024-09-21 – 2024-09-23 (×3): 2 via ORAL
  Filled 2024-09-21 (×3): qty 2

## 2024-09-21 NOTE — Progress Notes (Signed)
 Daily Progress Note   Patient Name: Pedro Young       Date: 09/21/2024 DOB: Sep 12, 1933  Age: 89 y.o. MRN#: [id number] Attending Physician: Tobie Yetta HERO, MD Primary Care Physician: Pcp, No Admit Date: 09/08/2024  Reason for Consultation/Follow-up: Terminal Care  Patient Profile/HPI:  89 y.o. male with past medical history of ESRD on TThS, HLD, HTN, CAD, BPH was admitted on 09/08/2024 with atrial fibrillation with RVR, acute on chronic anemia, hypotension. Hospital course has been complicated by sepsis secondary to pneumonia and hepatic and renal lesions noted on abdominal CT.  Transitioned to comfort measures only 11/12. No further dialysis. Has been accepted for [facility], but await bed availability.   Subjective: Chart reviewed including labs, progress notes, imaging from this and previous encounters.  No prn medications in the last 24 hours.  On eval patient awake, pleasant. No complaints. Patient's daughter Melynda at bedside.   Review of Systems  All other systems reviewed and are negative.    Physical Exam Vitals and nursing note reviewed.  Constitutional:      Comments: Very thin, frail  Cardiovascular:     Rate and Rhythm: Normal rate.  Pulmonary:     Effort: Pulmonary effort is normal.  Psychiatric:     Comments: pleasant             Vital Signs: BP 126/60 (BP Location: Right Arm)   Pulse 81   Temp 97.7 F (36.5 C) (Oral)   Resp 17   Ht 5' 6 (1.676 m)   Wt 56 kg   SpO2 92%   BMI 19.93 kg/m  SpO2: SpO2: 92 % O2 Device: O2 Device: Room Air O2 Flow Rate: O2 Flow Rate (L/min): 2 L/min  Intake/output summary:  Intake/Output Summary (Last 24 hours) at 09/21/2024 1201 Last data filed at 09/21/2024 0634 Gross per 24 hour  Intake 140 ml  Output 250 ml   Net -110 ml   LBM: Last BM Date : 09/20/24 Baseline Weight: Weight: 68 kg Most recent weight: Weight: 56 kg       Palliative Assessment/Data: PPS:  20%      Patient Active Problem List   Diagnosis Date Noted   Hypotension 09/09/2024   Atrial fibrillation with RVR (HCC) 09/08/2024   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   ESRD on dialysis (HCC) 04/18/2023  BPH (benign prostatic hyperplasia) 04/18/2023   Acute on chronic anemia 04/18/2023   Protein-calorie malnutrition, severe 04/07/2023   Hyponatremia 04/06/2023   CAD (coronary artery disease) 04/06/2023   Metabolic acidosis 04/06/2023   Subclinical hypothyroidism 05/25/2022   Angina pectoris 06/19/2020   Hyperlipidemia 05/16/2013   Anemia of chronic renal failure, stage 4 (severe) (HCC) 01/18/2010   Liver abscess via hepatic artery 2011, 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 01/18/2010   HEMATURIA, HX OF 01/18/2010   Gastritis and gastroduodenitis 06/27/2009   Calculus of gallbladder and bile duct 06/21/2009   ABDOMINAL PAIN, GENERALIZED 06/19/2009   Pure hyperglyceridemia 06/18/2009   HYPERTENSION, BENIGN ESSENTIAL 06/18/2009    Palliative Care Assessment & Plan    Assessment/Recommendations/Plan  Sepsis d/t pnumonia, hepatic and renal lesions, - comfort measures only, no further dialysis Await bed at [facility]- per note plan for family meeting today with Hospice liaison Per daughter request- letter created to support her leave of absence from work to attend to her father stating patient is terminally ill and receiving comfort focused end of life care- emailed securely to her at [email or web address]   Code Status:   Code Status: Do not attempt resuscitation (DNR) - Comfort care   Prognosis:  < 2 weeks  Discharge Planning: Hospice facility  Care plan was discussed with family.  Thank you for allowing the Palliative Medicine Team to assist in the care of this patient.  Total time:  45 mins Prolonged billing:  Time includes:    Preparing to see the patient (e.g., review of tests) Obtaining and/or reviewing separately obtained history Performing a medically necessary appropriate examination and/or evaluation Counseling and educating the patient/family/caregiver Ordering medications, tests, or procedures Referring and communicating with other health care professionals (when not reported separately) Documenting clinical information in the electronic or other health record Independently interpreting results (not reported separately) and communicating results to the patient/family/caregiver Care coordination (not reported separately) Clinical documentation  Cassondra Stain, AGNP-C Palliative Medicine   Please contact Palliative Medicine Team phone at [phone] for questions and concerns.

## 2024-09-21 NOTE — Progress Notes (Signed)
 [facility] PROGRESS NOTE  Patient: Pedro Young FMW:[id number]   PCP: Pcp, No DOB: April 08, 1933   DOA: 09/08/2024   DOS: 09/21/2024    Subjective: No nausea no vomiting.  No abdominal pain.  Minimal oral intake.  Reports constipation.  Objective:  Vitals:   09/19/24 2147 09/20/24 0823 09/20/24 2053 09/21/24 1426  BP: 133/61 (!) 120/52 126/60 125/62  Pulse: 78 79 81 81  Resp:  16 17   Temp: 97.6 F (36.4 C) (!) 97.5 F (36.4 C) 97.7 F (36.5 C) 97.7 F (36.5 C)  TempSrc: Oral Oral Oral Oral  SpO2: 98%  92% 98%  Weight:      Height:       Bowel sounds are present nontender. No edema.  Assessment and plan: Constipation. Bowel regimen initiated.  Still appropriate for hospice.  Author: Yetta Blanch, MD [facility] 09/21/2024 5:21 PM   If 7PM-7AM, please contact night-coverage at [email or web address]

## 2024-09-21 NOTE — TOC Progression Note (Addendum)
 Transition of Care [facility]) - Progression Note    Patient Details  Name: Pedro Young MRN: [id number] Date of Birth: 21-Nov-1932  [facility]) CM/SW Contact  [name] Public, LCSWA Phone Number: 09/21/2024, 10:45 AM  Clinical Narrative:     Inocente HERO. With [facility] informed CSW no bed availability today [facility]. Inocente plans on meeting with family this afternoon. CSW updated patients daughter [name]. CSW informed MD.CSW will continue to follow.  Expected Discharge Plan: Hospice Medical Facility Barriers to Discharge: Hospice Bed not available               Expected Discharge Plan and Services                                               Social Drivers of Health (SDOH) Interventions SDOH Screenings   Food Insecurity: No Food Insecurity (09/09/2024)  Housing: Low Risk  (09/09/2024)  Transportation Needs: No Transportation Needs (09/09/2024)  Utilities: Not At Risk (09/09/2024)  Depression (PHQ2-9): Low Risk  (06/01/2023)  Financial Resource Strain: Low Risk  (04/06/2024)   Received from [facility]  Physical Activity: Insufficiently Active (07/08/2023)   Received from [facility]  Social Connections: Socially Isolated (09/09/2024)  Stress: No Stress Concern Present (07/08/2023)   Received from [facility]  Tobacco Use: Medium Risk (09/08/2024)    Readmission Risk Interventions    04/20/2023    2:37 PM 04/08/2023   10:49 AM  Readmission Risk Prevention Plan  Transportation Screening Complete Complete  PCP or Specialist Appt within 5-7 Days  Complete  PCP or Specialist Appt within 3-5 Days Complete   Home Care Screening  Complete  Medication Review (RN CM)  Complete  HRI or Home Care Consult Complete   Social Work Consult for Recovery Care Planning/Counseling Complete   Palliative Care Screening Complete   Medication Review Oceanographer) Referral to Pharmacy

## 2024-09-21 NOTE — Care Management Important Message (Signed)
 Important Message  Patient Details  Name: Pedro Young MRN: [id number] Date of Birth: 08-25-1933   Important Message Given:  Yes - Medicare IM     [name] 09/21/2024, 11:23 AM

## 2024-09-22 DIAGNOSIS — I4891 Unspecified atrial fibrillation: Secondary | ICD-10-CM | POA: Diagnosis not present

## 2024-09-22 DIAGNOSIS — Z515 Encounter for palliative care: Secondary | ICD-10-CM | POA: Diagnosis not present

## 2024-09-22 DIAGNOSIS — N186 End stage renal disease: Secondary | ICD-10-CM | POA: Diagnosis not present

## 2024-09-22 DIAGNOSIS — Z992 Dependence on renal dialysis: Secondary | ICD-10-CM | POA: Diagnosis not present

## 2024-09-22 NOTE — Progress Notes (Signed)
 [facility] PROGRESS NOTE  Patient: Pedro Young FMW:[id number]   PCP: Pcp, No DOB: [birth year]   DOA: 09/08/2024   DOS: 09/22/2024    Subjective: Had a small BM yesterday.  No nausea no vomiting.  No fever no chills.  Objective:  Vitals:   09/21/24 1426 09/22/24 0527 09/22/24 1000 09/22/24 1100  BP: 125/62 (!) 135/59 (!) 128/49 (!) 132/57  Pulse: 81 80 79 77  Resp:    16  Temp: 97.7 F (36.5 C) 97.9 F (36.6 C) (!) 97.4 F (36.3 C) (!) 97.4 F (36.3 C)  TempSrc: Oral Oral Oral Oral  SpO2: 98% 100%    Weight:      Height:       Bowel sound present.  Nontender. Assessment and plan: Remains appropriate for hospice upon discharge. Family deciding to go home with hospice. Will be ready for discharge tomorrow on 11/21 after equipment delivery  Author: Yetta Blanch, MD [facility] 09/22/2024 4:25 PM   If 7PM-7AM, please contact night-coverage at [email or web address]

## 2024-09-22 NOTE — TOC Progression Note (Signed)
 Transition of Care [facility]) - Progression Note    Patient Details  Name: Pedro Young MRN: [id number] Date of Birth: Mar 30, 1933  Transition [facility] Care [facility]) CM/SW Contact  [name] Public, LCSWA Phone Number: 09/22/2024, 10:32 AM  Clinical Narrative:     CSW received call from patients daughter [name] who informed CSW that after speaking with MD that she has decided that she she would like to take patient home with hospice. CSW informed CM. CM spoke with patients daughter and plans on following up with [facility] and will then follow back up with patients daughter [name]. TOC will continue to follow.  Expected Discharge Plan: Hospice Medical Facility Barriers to Discharge: Hospice Bed not available               Expected Discharge Plan and Services                                               Social Drivers of Health (SDOH) Interventions SDOH Screenings   Food Insecurity: No Food Insecurity (09/09/2024)  Housing: Low Risk  (09/09/2024)  Transportation Needs: No Transportation Needs (09/09/2024)  Utilities: Not At Risk (09/09/2024)  Depression (PHQ2-9): Low Risk  (06/01/2023)  Financial Resource Strain: Low Risk  (04/06/2024)   Received from [facility]  Physical Activity: Insufficiently Active (07/08/2023)   Received from [facility]  Social Connections: Socially Isolated (09/09/2024)  Stress: No Stress Concern Present (07/08/2023)   Received from [facility]  Tobacco Use: Medium Risk (09/08/2024)    Readmission Risk Interventions    04/20/2023    2:37 PM 04/08/2023   10:49 AM  Readmission Risk Prevention Plan  Transportation Screening Complete Complete  PCP or Specialist Appt within 5-7 Days  Complete  PCP or Specialist Appt within 3-5 Days Complete   Home Care Screening  Complete  Medication Review (RN CM)  Complete  HRI or Home Care Consult Complete   Social Work Consult for Recovery Care Planning/Counseling Complete   Palliative Care Screening Complete    Medication Review Oceanographer) Referral to Pharmacy

## 2024-09-22 NOTE — Progress Notes (Signed)
 [facility] [phone] [facility] Liaison Note Received request from Transitions of Care Manager for hospice services at home after discharge. Spoke with patient's daughter [name] to initiate education related to hospice philosophy, services, and team approach to care. She verbalized understanding of information given. Per discussion, the plan is for discharge home by [facility] on 11/21.  DME needs discussed.[name] is requesting the following equipment for delivery: hospital bed and over bed table. The address has been verified and is correct in the chart. [name] is the family contact to arrange time of equipment delivery.  Please send signed and completed DNR home with patient/family. Please provide prescriptions at discharge as needed to ensure ongoing symptom management.   [facility] information and contact numbers given to [name]. Above information shared with Transitions of Care Manager. Please call with any questions or concerns.   Thank you for the opportunity to participate in this patient's care.   Amy Darien BSN, RN [facility] Liaison [phone]

## 2024-09-22 NOTE — Progress Notes (Signed)
 Daily Progress Note   Patient Name: Pedro Young       Date: 09/22/2024 DOB: 1933/05/15  Age: 89 y.o. MRN#: [id number] Attending Physician: Tobie Yetta HERO, MD Primary Care Physician: Pcp, No Admit Date: 09/08/2024  Reason for Consultation/Follow-up: Terminal Care  Patient Profile/HPI:  89 y.o. male with past medical history of ESRD on TThS, HLD, HTN, CAD, BPH was admitted on 09/08/2024 with atrial fibrillation with RVR, acute on chronic anemia, hypotension. Hospital course has been complicated by sepsis secondary to pneumonia and hepatic and renal lesions noted on abdominal CT.  Transitioned to comfort measures only 11/12. No further dialysis. Has been accepted for [facility], but await bed availability.   Subjective: Chart reviewed including labs, progress notes, imaging from this and previous encounters.  No prn medications in the last 24 hours.  On eval patient awake, pleasant. No complaints.  Family at bedside share they are working towards taking patient home with hospice.   Review of Systems  All other systems reviewed and are negative.    Physical Exam Vitals and nursing note reviewed.  Constitutional:      Comments: Very thin, frail  Cardiovascular:     Rate and Rhythm: Normal rate.  Pulmonary:     Effort: Pulmonary effort is normal.  Psychiatric:     Comments: pleasant             Vital Signs: BP (!) 132/57 (BP Location: Right Arm)   Pulse 77   Temp (!) 97.4 F (36.3 C) (Oral)   Resp 16   Ht 5' 6 (1.676 m)   Wt 56 kg   SpO2 100%   BMI 19.93 kg/m  SpO2: SpO2: 100 % O2 Device: O2 Device: Room Air O2 Flow Rate: O2 Flow Rate (L/min): 2 L/min  Intake/output summary:  Intake/Output Summary (Last 24 hours) at 09/22/2024 1227 Last data filed at 09/22/2024  0900 Gross per 24 hour  Intake 240 ml  Output 700 ml  Net -460 ml   LBM: Last BM Date : 09/20/24 (smear per family) Baseline Weight: Weight: 68 kg Most recent weight: Weight: 56 kg       Palliative Assessment/Data: PPS:  20%      Patient Active Problem List   Diagnosis Date Noted   Hypotension 09/09/2024   Atrial fibrillation with RVR (HCC)  09/08/2024   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   ESRD on dialysis (HCC) 04/18/2023   BPH (benign prostatic hyperplasia) 04/18/2023   Acute on chronic anemia 04/18/2023   Protein-calorie malnutrition, severe 04/07/2023   Hyponatremia 04/06/2023   CAD (coronary artery disease) 04/06/2023   Metabolic acidosis 04/06/2023   Subclinical hypothyroidism 05/25/2022   Angina pectoris 06/19/2020   Hyperlipidemia 05/16/2013   Anemia of chronic renal failure, stage 4 (severe) (HCC) 01/18/2010   Liver abscess via hepatic artery 2011, 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 01/18/2010   HEMATURIA, HX OF 01/18/2010   Gastritis and gastroduodenitis 06/27/2009   Calculus of gallbladder and bile duct 06/21/2009   ABDOMINAL PAIN, GENERALIZED 06/19/2009   Pure hyperglyceridemia 06/18/2009   HYPERTENSION, BENIGN ESSENTIAL 06/18/2009    Palliative Care Assessment & Plan    Assessment/Recommendations/Plan  Sepsis d/t pnumonia, hepatic and renal lesions, - comfort measures only, no further dialysis Plan is now for patient to d/c home with hospice   Code Status:   Code Status: Do not attempt resuscitation (DNR) - Comfort care   Prognosis:  < 2 weeks  Discharge Planning: Hospice facility  Care plan was discussed with family.  Thank you for allowing the Palliative Medicine Team to assist in the care of this patient.  Total time:  45 mins Prolonged billing:  Time includes:   Preparing to see the patient (e.g., review of tests) Obtaining and/or reviewing separately obtained history Performing a medically necessary appropriate examination and/or  evaluation Counseling and educating the patient/family/caregiver Ordering medications, tests, or procedures Referring and communicating with other health care professionals (when not reported separately) Documenting clinical information in the electronic or other health record Independently interpreting results (not reported separately) and communicating results to the patient/family/caregiver Care coordination (not reported separately) Clinical documentation  Cassondra Stain, AGNP-C Palliative Medicine   Please contact Palliative Medicine Team phone at [phone] for questions and concerns.

## 2024-09-22 NOTE — Plan of Care (Signed)
 ?  Problem: Elimination: ?Goal: Will not experience complications related to urinary retention ?Outcome: Progressing ?  ?

## 2024-09-22 NOTE — TOC Progression Note (Signed)
 Transition of Care [facility]) - Progression Note    Patient Details  Name: Pedro Young MRN: [id number] Date of Birth: 1933-09-20  Transition of Care [facility]) CM/SW Contact  Graves-Bigelow, Erminio Deems, RN Phone Number: 09/22/2024, 1:46 PM  Clinical Narrative:  ICM received a call from patient's daughter regarding Hospice services in the home. Daughter [name] is saying the patient can return to her home for [employer] Daughter states she has to prepare a room for the patient at her house (Address): [address] [place] [address]. Patient will need DME hospital bed and overbed table- [facility] is aware. Daughter states hopefully she can have the room prepared by the end of day and hopefully bed to be delivered tomorrow. [facility] Liaison states DME bed will be delivered in the morning. Patient will transition home via [facility] once DME has been delivered to the home. ICM will continue to follow for additional needs.    Expected Discharge Plan: Home w Hospice Care Barriers to Discharge:  (Awaiting DME Hospital Bed)  Expected Discharge Plan and Services     Post Acute Care Choice: Hospice Living arrangements for the past 2 months: Single Family Home    HH Arranged: RN HH Agency:  [facility]) Date HH Agency Contacted: 09/22/24 Time HH Agency Contacted: 1034 Representative spoke with at [facility] Agency: Amy   Social Drivers of Health (SDOH) Interventions SDOH Screenings   Food Insecurity: No Food Insecurity (09/09/2024)  Housing: Low Risk  (09/09/2024)  Transportation Needs: No Transportation Needs (09/09/2024)  Utilities: Not At Risk (09/09/2024)  Depression (PHQ2-9): Low Risk  (06/01/2023)  Financial Resource Strain: Low Risk  (04/06/2024)   Received from [facility]  Physical Activity: Insufficiently Active (07/08/2023)   Received from [facility]  Social Connections: Socially Isolated (09/09/2024)  Stress: No Stress Concern Present (07/08/2023)   Received from [facility]   Tobacco Use: Medium Risk (09/08/2024)    Readmission Risk Interventions    04/20/2023    2:37 PM 04/08/2023   10:49 AM  Readmission Risk Prevention Plan  Transportation Screening Complete Complete  PCP or Specialist Appt within 5-7 Days  Complete  PCP or Specialist Appt within 3-5 Days Complete   Home Care Screening  Complete  Medication Review (RN CM)  Complete  HRI or Home Care Consult Complete   Social Work Consult for Recovery Care Planning/Counseling Complete   Palliative Care Screening Complete   Medication Review Oceanographer) Referral to Pharmacy

## 2024-09-23 DIAGNOSIS — I4891 Unspecified atrial fibrillation: Secondary | ICD-10-CM | POA: Diagnosis not present

## 2024-09-23 MED ORDER — ONDANSETRON HCL 4 MG PO TABS: 4.0000 mg | ORAL_TABLET | Freq: Four times a day (QID) | ORAL | 0 refills | Status: AC | PRN

## 2024-09-23 MED ORDER — DOCUSATE SODIUM 100 MG PO CAPS: 100.0000 mg | ORAL_CAPSULE | Freq: Two times a day (BID) | ORAL | 0 refills | Status: AC

## 2024-09-23 MED ORDER — HYDROMORPHONE HCL 1 MG/ML PO LIQD: 0.5000 mg | ORAL | 0 refills | Status: AC | PRN

## 2024-09-23 MED ORDER — LORAZEPAM 1 MG PO TABS: 1.0000 mg | ORAL_TABLET | ORAL | 0 refills | Status: AC | PRN

## 2024-09-23 NOTE — Progress Notes (Signed)
 Palliative Medicine Progress Note   Patient Name: Rhylee Nunn       Date: 09/23/2024 DOB: 10-Jun-1933  Age: 89 y.o. MRN#: [id number] Attending Physician: Tobie Yetta HERO, MD Primary Care Physician: Pcp, No Admit Date: 09/08/2024  Reason for Consultation/Follow-up: {Reason for Consult:23484}  HPI/Patient Profile: 89 y.o. male with past medical history of ESRD on TThS, HLD, HTN, CAD, BPH was admitted on 09/08/2024 with atrial fibrillation with RVR, acute on chronic anemia, hypotension. Hospital course has been complicated by sepsis secondary to pneumonia and hepatic and renal lesions noted on abdominal CT.  Transitioned to comfort measures only 11/12. No further dialysis. Has been accepted for [facility], but await bed availability.   Subjective: ***  Objective:  Physical Exam          Vital Signs: BP 130/61 (BP Location: Right Arm)   Pulse 84   Temp 98.4 F (36.9 C) (Oral)   Resp 15   Ht 5' 6 (1.676 m)   Wt 56 kg   SpO2 99%   BMI 19.93 kg/m  SpO2: SpO2: 99 % O2 Device: O2 Device: Room Air O2 Flow Rate: O2 Flow Rate (L/min): 2 L/min  Intake/output summary:  Intake/Output Summary (Last 24 hours) at 09/23/2024 1408 Last data filed at 09/22/2024 1957 Gross per 24 hour  Intake 0 ml  Output --  Net 0 ml    LBM: Last BM Date : 09/22/24     Palliative Assessment/Data: ***     Palliative Medicine Assessment & Plan   Assessment: Principal Problem:   Atrial fibrillation with RVR (HCC) Active Problems:   Anemia of chronic renal failure, stage 4 (severe) (HCC)   HYPERTENSION, BENIGN ESSENTIAL   CAD (coronary artery disease)   ESRD on dialysis (HCC)   Hyperlipidemia   Subclinical hypothyroidism   Acute on chronic anemia   Hypotension     Recommendations/Plan: ***  Goals of Care and Additional Recommendations: Limitations on Scope of Treatment: {Recommended Scope and Preferences:21019}  Code Status:   Prognosis:  {Palliative Care Prognosis:23504}  Discharge Planning: {Palliative dispostion:23505}  Care plan was discussed with ***  Thank you for allowing the Palliative Medicine Team to assist in the care of this patient.   ***   Recardo KATHEE Loll, NP   Please contact Palliative Medicine Team phone at [phone] for questions and  concerns.  For individual providers, please see AMION.

## 2024-09-23 NOTE — TOC Transition Note (Addendum)
 Transition of Care [facility]) - Discharge Note   Patient Details  Name: Pedro Young MRN: [id number] Date of Birth: 25-Sep-1933  [facility]) CM/SW Contact:  Sudie Erminio Deems, RN Phone Number: 09/23/2024, 10:52 AM   Clinical Narrative:  ICM reached out to [facility] to check delivery timeframe for the hospital bed. Per [facility] Liaison, HB will be delivered between 10:00 am-12:00 pm. Amy will call the ICM once HB has been delivered. ICM will then call [facility] for transport home.    1209 ICM called [facility] and the ETA is 1.5 hours. Staff RN and daughter are aware. No further needs identified.   Final next level of care: Home w Hospice Care Barriers to Discharge:  (Awaiting DME Hospital Bed)  Discharge Plan and Services Additional resources added to the After Visit Summary for       Post Acute Care Choice: Hospice           HH Arranged: RN [facility] Agency:  [facility]) Date [facility] Agency Contacted: 09/22/24 Time HH Agency Contacted: 1034 Representative spoke with at [facility] Agency: Amy  Social Drivers of Health (SDOH) Interventions SDOH Screenings   Food Insecurity: No Food Insecurity (09/09/2024)  Housing: Low Risk  (09/09/2024)  Transportation Needs: No Transportation Needs (09/09/2024)  Utilities: Not At Risk (09/09/2024)  Depression (PHQ2-9): Low Risk  (06/01/2023)  Financial Resource Strain: Low Risk  (04/06/2024)   Received from [facility]  Physical Activity: Insufficiently Active (07/08/2023)   Received from [facility]  Social Connections: Socially Isolated (09/09/2024)  Stress: No Stress Concern Present (07/08/2023)   Received from [facility]  Tobacco Use: Medium Risk (09/08/2024)   Readmission Risk Interventions    04/20/2023    2:37 PM 04/08/2023   10:49 AM  Readmission Risk Prevention Plan  Transportation Screening Complete Complete  PCP or Specialist Appt within 5-7 Days  Complete  PCP or Specialist Appt within 3-5 Days Complete   Home Care  Screening  Complete  Medication Review (RN CM)  Complete  HRI or Home Care Consult Complete   Social Work Consult for Recovery Care Planning/Counseling Complete   Palliative Care Screening Complete   Medication Review Oceanographer) Referral to Pharmacy

## 2024-09-23 NOTE — Plan of Care (Signed)
  Problem: Coping: Goal: Level of anxiety will decrease Outcome: Progressing   Problem: Nutrition: Goal: Adequate nutrition will be maintained Outcome: Not Progressing   

## 2024-09-23 NOTE — Care Management Important Message (Signed)
 Important Message  Patient Details  Name: Pedro Young MRN: [id number] Date of Birth: 1933-08-13   Important Message Given:  Yes - Medicare IM     [name] 09/23/2024, 10:47 AM

## 2024-09-23 NOTE — Discharge Summary (Signed)
 Physician Discharge Summary   Patient: Pedro Young MRN: [id number] DOB: May 15, 1933  Admit date:     09/08/2024  Discharge date: 09/23/24  Discharge Physician: Yetta Blanch  PCP: Pcp, No  Recommendations at discharge: Continue home hospice at home.   Follow-up Information     [facility] Follow up.   Why: Hospice Registered Nurse- office to schedule a visit time Contact information: [address] [place] [address] [phone]                Hospital Course: Bryndan Bilyk is an 89 y.o. male past medical history significant for end-stage renal disease on hemodialysis Tuesday Thursdays and Saturdays, hypertension, BPH comes in for dizziness and palpitation found to be in A-fib with RVR. Cardiology was consulted and recommended IV heparin  and amiodarone .  Converted to sinus rhythm transition to oral amiodarone  Eliquis  Patient no report of interval status despite being on anticoagulation with Eliquis  with concerns for possible hepatic and renal lesions.  Presented with significant infection in the setting of possible pneumonia and findings of chronic pancreatitis and acute diverticulitis. Family decided transition to complete comfort care and stopping hemodialysis. Patient was initially considered for residential hospice although the bed was not available and the patient remained stable and therefore family decided to transition to home hospice.  Assessment and plan. Sepsis due to pneumonia Met SIRS criteria on admission. White count of 11 tachycardic, CT angio of the chest showed diffuse bronchial wall thickening concerning for bronchopneumonia CT abdomen positive for diverticulitis. Procalcitonin elevated at 39.5. Was treated with IV antibiotic. Sepsis physiology was resolved.   Hepatic and renal lesion: CT scan of the abdomen and pelvis showed occlusive right portal vein thrombosis and nonocclusive thrombus of the left  portal veins.  Right hepatic lobe  density.  An 8 mm lesion in the right kidney. MRI of the abdomen was ordered for possible malignancy although no infiltrative lesions were seen.   Portal vein thrombosis: Patient was on Eliquis  prior to admission. Despite this the patient developed portal vein thrombosis likely in the setting of diverticulitis and chronic pancreatitis. Currently transition to complete comfort.   New onset atrial fibrillation: Was on amiodarone  and Eliquis . Now comfort care.   Acute metabolic encephalopathy: Sleepy and confused.  In the setting of sepsis. Mentation has improved and near to baseline now.   Hypomagnesemia: Replaced.   End-stage renal disease on hemodialysis Tuesday Thursdays and Saturdays: Nephrology on board.  Was receiving hemodialysis here in the hospital. Family decided to stop hemodialysis intervention to complete comfort based on his presentation and desire. Prognosis short. Currently comfort care at home.   Essential hypertension: Antihypertensive medications were held as he was hypotensive started on low-dose midodrine . Blood pressure has remained relatively stable.  Now comfort care.   CAD: No chest pain.  Now comfort care.   Hyperlipidemia:  Was on Crestor .  Now comfort care.  Consultants:  Palliative care. Nephrology Cardiology  Procedures performed:  Hemodialysis  DISCHARGE MEDICATION: Allergies as of 09/23/2024       Reactions   Ranexa  [ranolazine  Er] Other (See Comments)   Myclonus, hallucinations   Atorvastatin    REACTION: arthralgia Other Reaction(s): Unknown   Lovastatin    REACTION: arthralgia Other Reaction(s): Unknown   Amoxicillin-pot Clavulanate Other (See Comments)   Did it involve swelling of the face/tongue/throat, SOB, or low BP? Unknown Did it involve sudden or severe rash/hives, skin peeling, or any reaction on the inside of your mouth or nose? Unknown Did you need  to seek medical attention at a hospital or doctor's office?  Unknown When did it last happen? patient is unfamiliar with this allergy   If all above answers are NO, may proceed with cephalosporin use.        Medication List     STOP taking these medications    aspirin  EC 81 MG tablet   colchicine  0.6 MG tablet   diclofenac Sodium 1 % Gel Commonly known as: VOLTAREN   isosorbide  mononitrate 60 MG 24 hr tablet Commonly known as: IMDUR    Lokelma  10 g Pack packet Generic drug: sodium zirconium cyclosilicate    metoprolol  tartrate 25 MG tablet Commonly known as: LOPRESSOR    nitroGLYCERIN  0.4 MG SL tablet Commonly known as: NITROSTAT    rosuvastatin  10 MG tablet Commonly known as: CRESTOR    rosuvastatin  40 MG tablet Commonly known as: CRESTOR    sevelamer  800 MG tablet Commonly known as: RENAGEL        TAKE these medications    acetaminophen  325 MG tablet Commonly known as: TYLENOL  Take 2 tablets (650 mg total) by mouth every 6 (six) hours as needed for mild pain (pain score 1-3) (or Fever >/= 101). What changed:  when to take this reasons to take this   docusate sodium  100 MG capsule Commonly known as: Colace Take 1 capsule (100 mg total) by mouth 2 (two) times daily.   HYDROmorphone  HCl 1 MG/ML Liqd Commonly known as: DILAUDID  Take 0.5 mLs (0.5 mg total) by mouth every 2 (two) hours as needed for severe pain (pain score 7-10) (increased work of breathing, RR >25, distress, dyspnea).   LORazepam  1 MG tablet Commonly known as: ATIVAN  Take 1 tablet (1 mg total) by mouth every hour as needed for anxiety, seizure or sleep (distress).   mirtazapine  15 MG tablet Commonly known as: REMERON  Take 1 tablet (15 mg total) by mouth daily. What changed: Another medication with the same name was removed. Continue taking this medication, and follow the directions you see here.   ondansetron  4 MG tablet Commonly known as: ZOFRAN  Take 1 tablet (4 mg total) by mouth every 6 (six) hours as needed for nausea.   tamsulosin  0.4 MG  Caps capsule Commonly known as: FLOMAX  Take 0.4 mg by mouth daily. What changed: Another medication with the same name was removed. Continue taking this medication, and follow the directions you see here.       Disposition: Home with hospice Diet recommendation: Regular diet  Discharge Exam: Vitals:   09/22/24 0945 09/22/24 1000 09/22/24 1100 09/22/24 1956  BP:  (!) 128/49 (!) 132/57 (!) 133/57  Pulse:  79 77 83  Resp:   16 16  Temp:  (!) 97.4 F (36.3 C) (!) 97.4 F (36.3 C) 97.6 F (36.4 C)  TempSrc:  Oral Oral Oral  SpO2: 98%   99%  Weight:      Height:       General: in Mild distress, No Rash Cardiovascular: S1 and S2 Present, No Murmur Respiratory: Good respiratory effort, Bilateral Air entry present. No Crackles, No wheezes Abdomen: Bowel Sound present, No tenderness Extremities: No edema Neuro: Alert and oriented x3, no new focal deficit   Filed Weights   09/10/24 0822 09/10/24 1139 09/13/24 1423  Weight: 57.8 kg 56.2 kg 56 kg   Condition at discharge: stable  The results of significant diagnostics from this hospitalization (including imaging, microbiology, ancillary and laboratory) are listed below for reference.   Imaging Studies: MR ABDOMEN W WO CONTRAST Result Date: 09/13/2024 EXAM: MRCP  WITH AND WITHOUT IV CONTRAST 09/13/2024 09:21:11 AM TECHNIQUE: Multisequence, multiplanar magnetic resonance images of the abdomen with and without intravenous contrast. MRCP sequences were performed. CONTRAST: 5.5 mL of Gadavist . COMPARISON: MR Abdomen 06/06/2014. Abdominopelvic CT of 09/12/2024. CLINICAL HISTORY: Acute abdominal pain, occlusive thrombus in the right portal vein and nonocclusive thrombus in the bifurcation of the left and right portal veins and small volume nonocclusive thrombus in the proximal left portal venous anatomy. FINDINGS: LIMITATIONS: Mild motion degradation throughout. LIVER: Iron deposition in the liver is evidenced by decreased signal on T2 and  long T1 weighted imaging. Scattered hepatic cysts. Again identified is occlusive thrombus throughout the right portal vein and its branches. Minimal extension of thrombus into the proximalmost left portal vein. Example images 50 and 46 of series 901 respectively. Main portal vein is patent. GALLBLADDER AND BILIARY SYSTEM: Cholecystectomy. No biliary duct dilatation. SPLEEN: Iron deposition in the spleen is evidenced by decreased signal on T2 and long T1 weighted imaging. PANCREAS/PANCREATIC DUCT: Tiny cystic foci within the pancreatic body including image 23/4 are likely areas of side branch duct ectasia. No acute inflammation. ADRENAL GLANDS: Normal adrenal glands. KIDNEYS: Atrophic kidneys bilaterally. Multiple nonenhancing renal lesions bilaterally; many lesions demonstrate precontrast hyperintensity on series 900, consistent with hemorrhagic/proteinaceous components. LYMPH NODES: No enlarged abdominal lymph nodes. VASCULATURE: Aortic atherosclerosis. Portal vein thrombus as detailed under LIVER. PERITONEUM: No ascites. ABDOMINAL WALL: No hernia. BOWEL: Grossly unremarkable. No bowel obstruction. BONES: No acute abnormality or worrisome osseous lesion. SOFT TISSUES: Unremarkable. MISCELLANEOUS: Small bilateral pleural effusions with bibasilar atelectasis. IMPRESSION: 1. Motion degraded exam. 2. Redemonstration of right greater than left portal vein thrombus, as detailed above. 3. Haemosiderosis. 4. Findings which are likely related to chronic pancreatitis and are of doubtful clinical significance, given patient age and comorbidities. 5. small bilateral pleural effusions. Electronically signed by: Rockey Kilts MD 09/13/2024 11:55 AM EST RP Workstation: [id number]   CT ABDOMEN PELVIS W CONTRAST Addendum Date: 09/12/2024 ADDENDUM REPORT: 09/12/2024 07:40 ADDENDUM: Critical Value/emergent results were called by telephone at the time of interpretation on 09/12/2024 at 7:40 am to provider PAULINE DIBIA , who  verbally acknowledged these results. Electronically Signed   By: Camellia Candle M.D.   On: 09/12/2024 07:40   Result Date: 09/12/2024 CLINICAL DATA:  Abdominal pain. EXAM: CT ABDOMEN AND PELVIS WITH CONTRAST TECHNIQUE: Multidetector CT imaging of the abdomen and pelvis was performed using the standard protocol following bolus administration of intravenous contrast. RADIATION DOSE REDUCTION: This exam was performed according to the departmental dose-optimization program which includes automated exposure control, adjustment of the mA and/or kV according to patient size and/or use of iterative reconstruction technique. CONTRAST:  75mL OMNIPAQUE  IOHEXOL  350 MG/ML SOLN COMPARISON:  09/27/2021 FINDINGS: Lower chest: Interval progression of bilateral calcified pleural plaques in the 3 year interval since prior study. Dependent atelectasis/scarring is progressive since prior. Hepatobiliary: Occlusive thrombus noted in the right portal vein with nonocclusive thrombus in the bifurcation of the left and right portal veins in small volume nonocclusive thrombus in the proximal left portal venous anatomy. Pneumobilia suggests prior sphincterotomy. Multiple scattered tiny well-defined hypodensities in the liver parenchyma are too small to characterize but are likely cysts. There are clustered areas of ill-defined low-density in the periphery of the right hepatic lobe, indeterminate. Gallbladder surgically absent. Pancreas: Gas in the main pancreatic duct likely due to prior sphincterotomy. Spleen: No splenomegaly. No suspicious focal mass lesion. Adrenals/Urinary Tract: No adrenal nodule or mass. Kidneys are atrophic. Multiple low-density renal lesions bilaterally are compatible  with cysts of varying size and complexity. 8 mm lesion lower pole right kidney on 45/3 may enhance raising the question of a tiny renal cell carcinoma. No evidence for hydroureter. Bladder wall trabeculation with multiple bladder wall diverticuli  suggests underlying component of bladder outlet obstruction. Stomach/Bowel: Moderate distention of the stomach. Wall thickening is noted in the region of the gastric pylorus. Duodenal bulb is patulous. Multiple duodenal diverticuli evident. No small bowel obstruction. The terminal ileum is normal. The appendix is not well visualized, but there is no edema or inflammation in the region of the cecal tip to suggest appendicitis. Trace fluid is noted around the cecum (coronal 47/6). Diffuse diverticular disease noted in the colon with marked wall thickening in the sigmoid segment no overt pericolonic edema inflammation to suggest acute diverticulitis. Vascular/Lymphatic: See above for description of portal vein thrombus. There is advanced atherosclerotic calcification of the abdominal aorta without aneurysm. Upper normal lymph nodes are seen in the hepatoduodenal ligament, likely reactive. No retroperitoneal lymphadenopathy. No pelvic lymphadenopathy. Reproductive: Prostate gland is heterogeneous and generates mass-effect on the bladder base suggesting median lobe hypertrophy. Other: No substantial intraperitoneal free fluid. Musculoskeletal: No worrisome lytic or sclerotic osseous abnormality. IMPRESSION: 1. Occlusive thrombus in the right portal vein and nonocclusive thrombus in the bifurcation of the left and right portal veins and small volume nonocclusive thrombus in the proximal left portal venous anatomy. Main portal vein is patent. 2. Clustered areas of ill-defined low-density in the periphery of the right hepatic lobe, indeterminate. MRI of the abdomen with and without contrast recommended to further evaluate and exclude underlying infiltrative mass lesion. 3. 8 mm lesion lower pole right kidney may enhance raising the question of a tiny renal cell carcinoma. Attention on follow-up MRI recommended. 4. Diffuse diverticular disease in the colon with marked wall thickening in the sigmoid segment. No overt  pericolonic edema inflammation to suggest acute diverticulitis. 5. Bladder wall trabeculation with multiple bladder wall diverticuli suggests underlying component of bladder outlet obstruction. 6. Interval progression of bilateral calcified pleural plaques in the 3 year interval since prior study. 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 7.  Aortic Atherosclerosis (ICD10-I70.0). Electronically Signed: By: Camellia Candle M.D. On: 09/12/2024 07:27   DG Chest 1 View Result Date: 09/10/2024 EXAM: 1 VIEW(S) XRAY OF THE CHEST 09/10/2024 12:05:28 AM COMPARISON: 09/08/2024 CLINICAL HISTORY: Pneumonia FINDINGS: LUNGS AND PLEURA: No focal pulmonary opacity. No pulmonary edema. No pleural effusion. No pneumothorax. Persistent elevated right hemidiaphragm. Bilateral calcified pleural plaques, unchanged. HEART AND MEDIASTINUM: Aortic atherosclerosis. BONES AND SOFT TISSUES: No acute osseous abnormality. IMPRESSION: 1. No acute cardiopulmonary findings. 2. Bilateral calcified pleural plaques, unchanged. Electronically signed by: Norman Gatlin MD 09/10/2024 12:29 AM EST RP Workstation: [id number]   ECHOCARDIOGRAM COMPLETE Result Date: 09/09/2024    ECHOCARDIOGRAM REPORT   Patient Name:   WISAM SIEFRING Date of Exam: 09/09/2024 Medical Rec #:  [id number]       Height:       66.0 in Accession #:    [id number]      Weight:       150.0 lb Date of Birth:  1933/02/26        BSA:          1.770 m Patient Age:    89 years        BP:           117/49 mmHg Patient Gender: M               HR:  86 bpm. Exam Location:  Inpatient Procedure: 2D Echo, Cardiac Doppler and Color Doppler (Both Spectral and Color            Flow Doppler were utilized during procedure). Indications:    Atrial Fibrillation I48.91  History:        Patient has prior history of Echocardiogram examinations, most                 recent 08/01/2020. CAD, Arrythmias:hx of AFIB; Risk                 Factors:Hypertension and Former Smoker.   Sonographer:    Koleen Popper RDCS Referring Phys: [id number] ANASTASSIA DOUTOVA IMPRESSIONS  1. Left ventricular ejection fraction, by estimation, is 60 to 65%. The left ventricle has normal function. The left ventricle has no regional wall motion abnormalities. There is mild left ventricular hypertrophy of the basal-septal segment. Left ventricular diastolic parameters are indeterminate.  2. Right ventricular systolic function is normal. The right ventricular size is normal. There is mildly elevated pulmonary artery systolic pressure. The estimated right ventricular systolic pressure is 39.1 mmHg.  3. Small echogenic density noted on the posterior mitral valve leaflet. Noted on apical 4 chamber (image 43 and 44) and apical 2 chamber (image 75 and 76) on the atrial side. The differential diagnosis includes reverberation artifact from MAC, leaflet calcification, healed thrombus/vegetation, etc. This was not present on prior transthoracic echocardiogram from September 2021. Clinical correlation required.. The mitral valve is degenerative. Mild mitral valve regurgitation. No evidence of mitral stenosis.  4. The aortic valve is tricuspid. Aortic valve regurgitation is not visualized. Aortic valve sclerosis is present, with no evidence of aortic valve stenosis.  5. The inferior vena cava is normal in size with <50% respiratory variability, suggesting right atrial pressure of 8 mmHg. Comparison(s): A prior study was performed on 08/01/2020. Prior images reviewed side by side. LVEF 66%, grade 1 diastolic dysfunction, mild LAE, mild MR, moderate TR, estimated RAP 8 mmHg. Conclusion(s)/Recommendation(s): Consider transesophageal echocardiogram to further evaluate the mitral valve, clinical correlation required. See above. FINDINGS  Left Ventricle: Left ventricular ejection fraction, by estimation, is 60 to 65%. The left ventricle has normal function. The left ventricle has no regional wall motion abnormalities. The left  ventricular internal cavity size was normal in size. There is  mild left ventricular hypertrophy of the basal-septal segment. Left ventricular diastolic parameters are indeterminate. Right Ventricle: The right ventricular size is normal. No increase in right ventricular wall thickness. Right ventricular systolic function is normal. There is mildly elevated pulmonary artery systolic pressure. The tricuspid regurgitant velocity is 2.79  m/s, and with an assumed right atrial pressure of 8 mmHg, the estimated right ventricular systolic pressure is 39.1 mmHg. Left Atrium: Left atrial size was normal in size. Right Atrium: Right atrial size was normal in size. Pericardium: There is no evidence of pericardial effusion. Mitral Valve: Small echogenic density noted on the posterior mitral valve leaflet. Noted on apical 4 chamber (image 43 and 44) and apical 2 chamber (image 75 and 76) on the atrial side. The differential diagnosis includes reverberation artifact from MAC,  leaflet calcification, healed thrombus/vegetation, etc. This was not present on prior transthoracic echocardiogram from September 2021. Clinical correlation required. The mitral valve is degenerative in appearance. There is mild thickening of the mitral  valve leaflet(s). There is mild calcification of the mitral valve leaflet(s). Mild to moderate mitral annular calcification. Mild mitral valve regurgitation. No evidence of mitral valve stenosis. Tricuspid Valve:  The tricuspid valve is normal in structure. Tricuspid valve regurgitation is mild . No evidence of tricuspid stenosis. Aortic Valve: The aortic valve is tricuspid. Aortic valve regurgitation is not visualized. Aortic valve sclerosis is present, with no evidence of aortic valve stenosis. Pulmonic Valve: The pulmonic valve was not well visualized. Pulmonic valve regurgitation is not visualized. Aorta: The aortic root and ascending aorta are structurally normal, with no evidence of dilitation. Venous:  The inferior vena cava is normal in size with less than 50% respiratory variability, suggesting right atrial pressure of 8 mmHg. IAS/Shunts: The atrial septum is grossly normal.  LEFT VENTRICLE PLAX 2D LVIDd:         4.10 cm      Diastology LVIDs:         2.90 cm      LV e' medial:    8.05 cm/s LV PW:         1.10 cm      LV E/e' medial:  11.1 LV IVS:        1.40 cm      LV e' lateral:   8.92 cm/s LVOT diam:     1.80 cm      LV E/e' lateral: 10.0 LV SV:         53 LV SV Index:   30 LVOT Area:     2.54 cm  LV Volumes (MOD) LV vol d, MOD A4C: 121.0 ml LV vol s, MOD A4C: 57.8 ml LV SV MOD A4C:     121.0 ml RIGHT VENTRICLE             IVC RV Basal diam:  3.31 cm     IVC diam: 2.04 cm RV S prime:     23.60 cm/s TAPSE (M-mode): 2.2 cm LEFT ATRIUM             Index        RIGHT ATRIUM           Index LA diam:        3.72 cm 2.10 cm/m   RA Area:     13.10 cm LA Vol (A2C):   34.0 ml 19.21 ml/m  RA Volume:   25.00 ml  14.13 ml/m LA Vol (A4C):   27.6 ml 15.60 ml/m LA Biplane Vol: 31.9 ml 18.03 ml/m  AORTIC VALVE LVOT Vmax:   120.00 cm/s LVOT Vmean:  80.300 cm/s LVOT VTI:    0.208 m  AORTA Ao Root diam: 3.42 cm Ao Asc diam:  3.60 cm MITRAL VALVE                TRICUSPID VALVE MV Area (PHT): 3.99 cm     TR Peak grad:   31.1 mmHg MV Decel Time: 190 msec     TR Vmax:        279.00 cm/s MR Peak grad: 65.9 mmHg MR Vmax:      406.00 cm/s   SHUNTS MV E velocity: 89.10 cm/s   Systemic VTI:  0.21 m MV A velocity: 105.00 cm/s  Systemic Diam: 1.80 cm MV E/A ratio:  0.85 Sunit Tolia Electronically signed by Madonna Large Signature Date/Time: 09/09/2024/12:48:50 PM    Final    CT Angio Chest PE W and/or Wo Contrast Result Date: 09/08/2024 EXAM: CTA of the Chest with contrast for PE 09/08/2024 08:53:33 PM TECHNIQUE: CTA of the chest was performed after the administration of 75 mL of iohexol  (OMNIPAQUE ) 350 MG/ML injection. Multiplanar reformatted images are provided for review. MIP  images are provided for review. Automated exposure  control, iterative reconstruction, and/or weight based adjustment of the mA/kV was utilized to reduce the radiation dose to as low as reasonably achievable. COMPARISON: Comparison with same day chest x-ray and CT 3/ 19 / 11. CLINICAL HISTORY: Pulmonary embolism (PE) suspected, high prob. Including shortness of breath and new atrial fibrillation in the clinical indication. FINDINGS: PULMONARY ARTERIES: Pulmonary arteries are adequately opacified for evaluation. No pulmonary embolism. Main pulmonary artery is normal in caliber. MEDIASTINUM: The heart demonstrates coronary artery atherosclerotic calcification. The pericardium demonstrates no acute abnormality. There is aortic atherosclerotic calcification. LYMPH NODES: No mediastinal, hilar or axillary lymphadenopathy. LUNGS AND PLEURA: Diffuse bronchial wall thickening. Lower lung atelectasis and scarring. Bilateral pleural thickening and calcified pleural plaques greatest in the posterior lower lobes. No focal consolidation or pulmonary edema. No pleural effusion or pneumothorax. UPPER ABDOMEN: Pneumobilia. Cholecystectomy. Small hiatal hernia. SOFT TISSUES AND BONES: No acute bone or soft tissue abnormality. IMPRESSION: 1. No pulmonary embolism. 2. Diffuse bronchial wall thickening compatible with airway infection or inflammation. 3. 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 Electronically signed by: Norman Gatlin MD 09/08/2024 09:05 PM EST RP Workstation: [id number]   DG Chest Port 1 View Result Date: 09/08/2024 EXAM: 1 VIEW(S) XRAY OF THE CHEST 09/08/2024 05:18:00 PM COMPARISON: None available. CLINICAL HISTORY: sob, new afib FINDINGS: LUNGS AND PLEURA: Low lung volumes. Right lower lung zone calcified granuloma. Left chest calcified pleural plaques have progressed from prior. No pulmonary edema. No pleural effusion. No pneumothorax. HEART AND MEDIASTINUM: Atherosclerotic plaque. No acute abnormality of the cardiac and mediastinal silhouettes.  BONES AND SOFT TISSUES: No acute osseous abnormality. IMPRESSION: 1. No acute cardiopulmonary process identified. 2. Left chest calcified pleural plaques have progressed from prior. Electronically signed by: Norman Gatlin MD 09/08/2024 05:33 PM EST RP Workstation: [id number]    Microbiology: Results for orders placed or performed during the hospital encounter of 09/08/24  Resp panel by RT-PCR (RSV, Flu A&B, Covid) Anterior Nasal Swab     Status: None   Collection Time: 09/10/24 12:16 AM   Specimen: Anterior Nasal Swab  Result Value Ref Range Status   SARS Coronavirus 2 by RT PCR NEGATIVE NEGATIVE Final   Influenza A by PCR NEGATIVE NEGATIVE Final   Influenza B by PCR NEGATIVE NEGATIVE Final    Comment: (NOTE) The Xpert Xpress SARS-CoV-2/FLU/RSV plus assay is intended as an aid in the diagnosis of influenza from Nasopharyngeal swab specimens and should not be used as a sole basis for treatment. Nasal washings and aspirates are unacceptable for Xpert Xpress SARS-CoV-2/FLU/RSV testing.  Fact Sheet for Patients: bloggercourse.com  Fact Sheet for Healthcare Providers: seriousbroker.it  This test is not yet approved or cleared by the [place]  FDA and has been authorized for detection and/or diagnosis of SARS-CoV-2 by FDA under an Emergency Use Authorization (EUA). This EUA will remain in effect (meaning this test can be used) for the duration of the COVID-19 declaration under Section 564(b)(1) of the Act, 21 U.S.C. section 360bbb-3(b)(1), unless the authorization is terminated or revoked.     Resp Syncytial Virus by PCR NEGATIVE NEGATIVE Final    Comment: (NOTE) Fact Sheet for Patients: bloggercourse.com  Fact Sheet for Healthcare Providers: seriousbroker.it  This test is not yet approved or cleared by the [place]  FDA and has been authorized for detection and/or diagnosis  of SARS-CoV-2 by FDA under an Emergency Use Authorization (EUA). This EUA will remain in effect (meaning this test can be used) for the duration  of the COVID-19 declaration under Section 564(b)(1) of the Act, 21 U.S.C. section 360bbb-3(b)(1), unless the authorization is terminated or revoked.  Performed at [facility], [address] [place], [place] [address]   Culture, blood (Routine X 2) w Reflex to ID Panel     Status: None   Collection Time: 09/10/24  5:19 AM   Specimen: BLOOD RIGHT HAND  Result Value Ref Range Status   Specimen Description BLOOD RIGHT HAND  Final   Special Requests   Final    BOTTLES DRAWN AEROBIC ONLY Blood Culture adequate volume   Culture   Final    NO GROWTH 5 DAYS Performed at [facility], [address] [place], [place] [address]    Report Status 09/15/2024 FINAL  Final  Culture, blood (Routine X 2) w Reflex to ID Panel     Status: None   Collection Time: 09/10/24  7:04 AM   Specimen: BLOOD RIGHT ARM  Result Value Ref Range Status   Specimen Description BLOOD RIGHT ARM  Final   Special Requests   Final    BOTTLES DRAWN AEROBIC AND ANAEROBIC Blood Culture results may not be optimal due to an inadequate volume of blood received in culture bottles   Culture   Final    NO GROWTH 5 DAYS Performed at [facility], [address], [place], [place] [address]    Report Status 09/15/2024 FINAL  Final  MRSA Next Gen by PCR, Nasal     Status: None   Collection Time: 09/10/24  2:53 PM   Specimen: Nasal Mucosa; Nasal Swab  Result Value Ref Range Status   MRSA by PCR Next Gen NOT DETECTED NOT DETECTED Final    Comment: (NOTE) The GeneXpert MRSA Assay (FDA approved for NASAL specimens only), is one component of a comprehensive MRSA colonization surveillance program. It is not intended to diagnose MRSA infection nor to guide or monitor treatment for MRSA infections. Test performance is not FDA approved in patients less than 102  years old. Performed at [facility], [address] [place], [place] [address]    Labs: CBC: No results for input(s): WBC, NEUTROABS, HGB, HCT, MCV, PLT in the last 168 hours. Basic Metabolic Panel: No results for input(s): NA, K, CL, CO2, GLUCOSE, BUN, CREATININE, CALCIUM , MG, PHOS in the last 168 hours. Liver Function Tests: No results for input(s): AST, ALT, ALKPHOS, BILITOT, PROT, ALBUMIN in the last 168 hours. CBG: No results for input(s): GLUCAP in the last 168 hours.  Discharge time spent: greater than 30 minutes.  Author: Yetta Blanch, MD  [facility]

## 2024-10-17 NOTE — Progress Notes (Signed)
 This chart has been reviewed by a [facility] Specialist to assess and address outstanding health maintenance needs, aiming to close quality care gaps and promote preventive care.  No outreach is required, as gaps have been resolved by updating the chart HMM through Care Everywhere.   MEDICARE ANNUAL WELLNESS VISIT  Patient outreached by CC 05/20/24.
